# Patient Record
Sex: Female | Born: 1950 | Race: Black or African American | Hispanic: No | Smoking: Current some day smoker
Health system: Southern US, Community
[De-identification: ages and names within clinical notes are randomized; demographics above are authoritative.]

## PROBLEM LIST (undated history)

## (undated) DIAGNOSIS — K219 Gastro-esophageal reflux disease without esophagitis: Secondary | ICD-10-CM

## (undated) DIAGNOSIS — I10 Essential (primary) hypertension: Secondary | ICD-10-CM

## (undated) DIAGNOSIS — Z9289 Personal history of other medical treatment: Secondary | ICD-10-CM

## (undated) DIAGNOSIS — C569 Malignant neoplasm of unspecified ovary: Secondary | ICD-10-CM

## (undated) DIAGNOSIS — E785 Hyperlipidemia, unspecified: Secondary | ICD-10-CM

## (undated) DIAGNOSIS — M1990 Unspecified osteoarthritis, unspecified site: Secondary | ICD-10-CM

## (undated) HISTORY — DX: Malignant neoplasm of unspecified ovary: C56.9

## (undated) HISTORY — PX: PORTACATH PLACEMENT: SHX2246

## (undated) HISTORY — DX: Hyperlipidemia, unspecified: E78.5

---

## 2000-05-25 ENCOUNTER — Other Ambulatory Visit: Admission: RE | Admit: 2000-05-25 | Discharge: 2000-05-25 | Payer: Self-pay | Admitting: Internal Medicine

## 2000-06-19 ENCOUNTER — Ambulatory Visit (HOSPITAL_COMMUNITY): Admission: RE | Admit: 2000-06-19 | Discharge: 2000-06-19 | Payer: Self-pay | Admitting: Internal Medicine

## 2000-06-19 ENCOUNTER — Encounter: Payer: Self-pay | Admitting: Internal Medicine

## 2003-10-07 ENCOUNTER — Other Ambulatory Visit: Admission: RE | Admit: 2003-10-07 | Discharge: 2003-10-07 | Payer: Self-pay | Admitting: Internal Medicine

## 2003-10-20 ENCOUNTER — Ambulatory Visit (HOSPITAL_COMMUNITY): Admission: RE | Admit: 2003-10-20 | Discharge: 2003-10-20 | Payer: Self-pay | Admitting: Internal Medicine

## 2005-06-21 ENCOUNTER — Ambulatory Visit: Payer: Self-pay | Admitting: Gastroenterology

## 2005-07-05 ENCOUNTER — Encounter (INDEPENDENT_AMBULATORY_CARE_PROVIDER_SITE_OTHER): Payer: Self-pay | Admitting: Specialist

## 2005-07-05 ENCOUNTER — Ambulatory Visit: Payer: Self-pay | Admitting: Gastroenterology

## 2005-07-06 ENCOUNTER — Emergency Department (HOSPITAL_COMMUNITY): Admission: EM | Admit: 2005-07-06 | Discharge: 2005-07-07 | Payer: Self-pay | Admitting: Emergency Medicine

## 2006-08-07 ENCOUNTER — Ambulatory Visit (HOSPITAL_COMMUNITY): Admission: RE | Admit: 2006-08-07 | Discharge: 2006-08-07 | Payer: Self-pay | Admitting: Internal Medicine

## 2006-09-21 ENCOUNTER — Other Ambulatory Visit: Admission: RE | Admit: 2006-09-21 | Discharge: 2006-09-21 | Payer: Self-pay | Admitting: Internal Medicine

## 2008-12-11 ENCOUNTER — Ambulatory Visit (HOSPITAL_COMMUNITY): Admission: RE | Admit: 2008-12-11 | Discharge: 2008-12-11 | Payer: Self-pay | Admitting: Internal Medicine

## 2010-04-04 HISTORY — PX: JOINT REPLACEMENT: SHX530

## 2010-05-05 ENCOUNTER — Other Ambulatory Visit (HOSPITAL_COMMUNITY): Payer: Self-pay

## 2010-05-06 ENCOUNTER — Ambulatory Visit (HOSPITAL_COMMUNITY)
Admission: RE | Admit: 2010-05-06 | Discharge: 2010-05-06 | Disposition: A | Discharge status: Home / Self Care | Payer: BC Managed Care – PPO | Source: Ambulatory Visit | Attending: Orthopedic Surgery | Admitting: Orthopedic Surgery

## 2010-05-06 ENCOUNTER — Other Ambulatory Visit (HOSPITAL_COMMUNITY): Payer: Self-pay | Admitting: Orthopedic Surgery

## 2010-05-06 ENCOUNTER — Encounter (HOSPITAL_COMMUNITY): Payer: BC Managed Care – PPO

## 2010-05-06 DIAGNOSIS — M1712 Unilateral primary osteoarthritis, left knee: Secondary | ICD-10-CM

## 2010-05-06 DIAGNOSIS — Z01812 Encounter for preprocedural laboratory examination: Secondary | ICD-10-CM | POA: Insufficient documentation

## 2010-05-06 DIAGNOSIS — Z01818 Encounter for other preprocedural examination: Secondary | ICD-10-CM | POA: Insufficient documentation

## 2010-05-06 DIAGNOSIS — M1710 Unilateral primary osteoarthritis, unspecified knee: Secondary | ICD-10-CM | POA: Insufficient documentation

## 2010-05-06 DIAGNOSIS — Z01810 Encounter for preprocedural cardiovascular examination: Secondary | ICD-10-CM | POA: Insufficient documentation

## 2010-05-06 LAB — URINALYSIS, ROUTINE W REFLEX MICROSCOPIC
Bilirubin Urine: NEGATIVE
Ketones, ur: NEGATIVE mg/dL
Nitrite: POSITIVE — AB
Protein, ur: NEGATIVE mg/dL
Urine Glucose, Fasting: NEGATIVE mg/dL

## 2010-05-06 LAB — SURGICAL PCR SCREEN: MRSA, PCR: NEGATIVE

## 2010-05-06 LAB — DIFFERENTIAL
Basophils Absolute: 0 10*3/uL (ref 0.0–0.1)
Basophils Relative: 0 % (ref 0–1)
Eosinophils Absolute: 0.1 10*3/uL (ref 0.0–0.7)
Eosinophils Relative: 1 % (ref 0–5)
Lymphocytes Relative: 38 % (ref 12–46)

## 2010-05-06 LAB — CBC
Platelets: 247 10*3/uL (ref 150–400)
RDW: 14.7 % (ref 11.5–15.5)
WBC: 10.1 10*3/uL (ref 4.0–10.5)

## 2010-05-06 LAB — TYPE AND SCREEN
ABO/RH(D): O POS
Antibody Screen: NEGATIVE

## 2010-05-06 LAB — APTT: aPTT: 33 seconds (ref 24–37)

## 2010-05-06 LAB — BASIC METABOLIC PANEL
BUN: 18 mg/dL (ref 6–23)
Creatinine, Ser: 0.86 mg/dL (ref 0.4–1.2)
GFR calc non Af Amer: >60 mL/min (ref >60)

## 2010-05-06 LAB — URINE MICROSCOPIC-ADD ON

## 2010-05-06 LAB — PROTIME-INR: INR: 0.98 (ref 0.00–1.49)

## 2010-05-10 ENCOUNTER — Inpatient Hospital Stay (HOSPITAL_COMMUNITY)
Admission: RE | Admit: 2010-05-10 | Discharge: 2010-05-12 | DRG: 209 | Disposition: A | Discharge status: Home Health | Payer: BC Managed Care – PPO | Source: Ambulatory Visit | Attending: Orthopedic Surgery | Admitting: Orthopedic Surgery

## 2010-05-10 DIAGNOSIS — Z01812 Encounter for preprocedural laboratory examination: Secondary | ICD-10-CM

## 2010-05-10 DIAGNOSIS — F17200 Nicotine dependence, unspecified, uncomplicated: Secondary | ICD-10-CM | POA: Diagnosis present

## 2010-05-10 DIAGNOSIS — M1710 Unilateral primary osteoarthritis, unspecified knee: Principal | ICD-10-CM | POA: Diagnosis present

## 2010-05-10 DIAGNOSIS — Z01810 Encounter for preprocedural cardiovascular examination: Secondary | ICD-10-CM

## 2010-05-11 LAB — PROTIME-INR: Prothrombin Time: 14 seconds (ref 11.6–15.2)

## 2010-05-11 LAB — CBC
Hemoglobin: 11.1 g/dL — ABNORMAL LOW (ref 12.0–15.0)
MCH: 26.8 pg (ref 26.0–34.0)
MCHC: 32.7 g/dL (ref 30.0–36.0)
MCV: 81.9 fL (ref 78.0–100.0)

## 2010-05-11 LAB — BASIC METABOLIC PANEL
BUN: 8 mg/dL (ref 6–23)
CO2: 27 mEq/L (ref 19–32)
Calcium: 8.9 mg/dL (ref 8.4–10.5)
Chloride: 102 mEq/L (ref 96–112)
Creatinine, Ser: 0.89 mg/dL (ref 0.4–1.2)
GFR calc Af Amer: >60 mL/min (ref >60)
Glucose, Bld: 136 mg/dL — ABNORMAL HIGH (ref 70–99)

## 2010-05-11 NOTE — Op Note (Addendum)
Melissa Ball, Melissa Ball              ACCOUNT NO.:  [id number]  MEDICAL RECORD NO.:  [id number]           PATIENT TYPE:  I  LOCATION:  5002                         FACILITY:  [facility]  PHYSICIAN:  Feliberto Gottron. Turner Daniels, M.D.   DATE OF BIRTH:  05/03/1950  DATE OF PROCEDURE:  05/10/2010 DATE OF DISCHARGE:                              OPERATIVE REPORT   PREOPERATIVE DIAGNOSIS:  End-stage arthritis of the left knee.  POSTOPERATIVE DIAGNOSIS:  End-stage arthritis of the left knee.  PROCEDURE:  Left total knee arthroplasty using DePuy Sigma RP components, 3 femur, 4 tibia, 10-mm Sigma RP bearing, 38-mm patellar button.  All components cemented with a double batch of DePuy HV cement with 1500 mg of Zinacef.  SURGEON:  Feliberto Gottron.  Turner Daniels, MD  FIRST ASSISTANT:  Shirl Harris, PA-C.  ANESTHESIA:  General endotracheal.  ESTIMATED BLOOD LOSS:  Minimal.  FLUID REPLACEMENT:  1500 mL of crystalloid.  DRAINS PLACED:  Foley catheter and two medium hemostats.  URINE OUTPUT:  400 mL.  TOURNIQUET TIME:  1 hour and 35 minutes.  INDICATIONS FOR PROCEDURE:  A 60 year old woman with end-stage arthritis of left greater than right knee varus deformity and 10 degrees flexion contracture.  She desires elective left total knee arthroplasty having failed conservative measures with physical therapy, anti-inflammatory medicine, cortisone injections, and Viscoat supplementation.  Her pain now prevents ambulation more than a couple of blocks, wakes her up at night, and interferes with activities of daily living.  She desires elective left total knee arthroplasty.  Risks and benefits of surgery have been discussed, questions answered.  DESCRIPTION OF PROCEDURE:  The patient identified by armband, received preoperative IV antibiotics in the holding area at [facility]. She also had a left femoral nerve block placed by Dr. Ivin Booty, taken to operating room 5, appropriate anesthetic monitors were attached,  general endotracheal anesthesia was induced with the patient in supine position. Foley catheter inserted.  Foot positioner and lateral post applied to the table.  Zimmer tourniquet applied high to the left thigh and left lower extremity prepped and draped in sterile fashion from the ankle to the midthigh.  Time-out procedure performed.  The limb was wrapped with an Esmarch bandage, knee bent, tourniquet inflated to 350 mmHg and began the operation by making an anterior midline incision starting one handbreadth above the patella going over the patella 1 cm medial to and 4 cm distal to the tibial tubercles.  Small bleeders in the skin and subcutaneous tissue identified and cauterized.  Transverse retinaculum incised, reflected medially allowing a medial parapatellar arthrotomy, patella everted, prepatellar fat pad resected.  Superficial medial collateral ligament elevated from anterior to posterior off the medial flare of the tibia leaving intact distally.  This loosened up the MCL to help correct the varus deformity.  Once this had been accomplished, the patella was everted, the knee was hyperflexed exposing the arthritic joint, which has large medial peripheral osteophytes and with bone-on- bone to the medial compartment less so the patellofemoral compartment. Anterior one half of the menisci resected.  Cruciate ligaments were resected.  Notch osteophytes removed with an osteotome as were  the tibial spines allowing placement of posteromedial Z-retractor, McCulloch retractor through the notch and lateral Hohmann retractor.  This enhanced our exposure to the proximal tibia, which was then entered with the step drill in line with the axis of the tibia.  Cutting guide was set at 2 degrees posterior slope and was pinned, so we removed 45 mm of bone medially, 8-9 mm bone laterally.  Satisfied with our tibial resection, we entered the distal femur 2 mm anterior to the PCL origin followed by the  intramedullary rod set for a 5 degrees pf left distal femoral cut at 11 mm.  This was pinned along the epicondylar axis and the distal femoral cut accomplished.  We then sized for a three femur, used the posterior referencing sizing guide, set the pins at 3 degrees of external rotation and screwed the chamfer cutting guide into place. The anterior and posterior chamfer cuts accomplished without difficulty followed by the Sigma RP box cutting guide and box cut.  We then brought the distal femur elevated anteriorly to remove posterior osteophytes from the femoral condyles, brought the knee to full extension to remove the posterior one-half of the menisci and checked our spacer with a 10- mm block and found it to be excellent.  The patella was measured at 26 mm cutting guide set at 15, and the posterior 10 mm of the patella resected followed by sizing for 38 button and drilling the patella. Once again, the knee was hyperflexed, tibia exposed.  We sized for a #4 tibial baseplate, pinned the trial into place followed by the smokestack in the conical reamer and the Delta fin keel.  We then hammered into place a three left femoral trial, drilled the lugs, inserted a 10-mm trial bearing and a 38 trial patellar button, brought the knee to full extension and then had it flexed to about 120 degrees limited by adipose tissue.  At this point, the trial components removed.  All bony surfaces water picked, clean and dried with suction sponges.  Double batch of DePuy HV cement was then mixed with 1500 mg of Zinacef in the monomer and applied to all bony metallic mating surfaces except for the posterior condyles of the femur itself.  In order, we hammered into a place a 4 tibial baseplate, 3 left femur, and the removed excess cement. We inserted a 10-mm Sigma RP bearing and brought the knee to full extension with compression and then clamped into place the 38-mm patellar button and again removed the excess  cement.  The wound was irrigated out with normal saline solution.  Medium Hemovac drains were inserted from the anterolateral approach, and the wound closed in layers with running #1 Vicryl suture and this tendon, 2-0 and 0 undyed Vicryl suture in the subcutaneous tissue, and staples on the skin.  A dressing of Xeroform, 4 x 4 dressings, sponges, Webril, and Ace wrap applied. The tourniquet was let down.  The patient awakened and extubated, taken to the recovery room without difficulty.     Feliberto Gottron. Turner Daniels, M.D.     Ovid Curd  D:  05/10/2010  T:  05/10/2010  Job:  [id number]  Electronically Signed by Gean Birchwood M.D. on 05/11/2010 08:47:08 AM

## 2010-05-12 LAB — CBC
HCT: 31.3 % — ABNORMAL LOW (ref 36.0–46.0)
MCH: 26.3 pg (ref 26.0–34.0)
MCHC: 32.3 g/dL (ref 30.0–36.0)
MCV: 81.5 fL (ref 78.0–100.0)
Platelets: 185 10*3/uL (ref 150–400)
RBC: 3.84 MIL/uL — ABNORMAL LOW (ref 3.87–5.11)
RDW: 14.6 % (ref 11.5–15.5)
WBC: 14.1 10*3/uL — ABNORMAL HIGH (ref 4.0–10.5)

## 2010-05-12 LAB — PROTIME-INR: Prothrombin Time: 16.4 seconds — ABNORMAL HIGH (ref 11.6–15.2)

## 2010-06-01 ENCOUNTER — Ambulatory Visit: Payer: BC Managed Care – PPO | Attending: Orthopedic Surgery

## 2010-06-04 ENCOUNTER — Ambulatory Visit: Payer: BC Managed Care – PPO

## 2010-06-16 ENCOUNTER — Ambulatory Visit: Payer: BC Managed Care – PPO | Attending: Orthopedic Surgery

## 2010-06-16 DIAGNOSIS — R262 Difficulty in walking, not elsewhere classified: Secondary | ICD-10-CM | POA: Insufficient documentation

## 2010-06-16 DIAGNOSIS — M25569 Pain in unspecified knee: Secondary | ICD-10-CM | POA: Insufficient documentation

## 2010-06-16 DIAGNOSIS — M25669 Stiffness of unspecified knee, not elsewhere classified: Secondary | ICD-10-CM | POA: Insufficient documentation

## 2010-06-16 DIAGNOSIS — IMO0001 Reserved for inherently not codable concepts without codable children: Secondary | ICD-10-CM | POA: Insufficient documentation

## 2010-08-24 ENCOUNTER — Other Ambulatory Visit (HOSPITAL_COMMUNITY)
Admission: RE | Admit: 2010-08-24 | Discharge: 2010-08-24 | Disposition: A | Discharge status: Home / Self Care | Payer: BC Managed Care – PPO | Source: Ambulatory Visit | Attending: Internal Medicine | Admitting: Internal Medicine

## 2010-08-24 ENCOUNTER — Other Ambulatory Visit (HOSPITAL_COMMUNITY): Payer: Self-pay | Admitting: Internal Medicine

## 2010-08-24 DIAGNOSIS — Z01419 Encounter for gynecological examination (general) (routine) without abnormal findings: Secondary | ICD-10-CM | POA: Insufficient documentation

## 2010-08-24 DIAGNOSIS — Z1231 Encounter for screening mammogram for malignant neoplasm of breast: Secondary | ICD-10-CM

## 2010-09-06 ENCOUNTER — Ambulatory Visit (HOSPITAL_COMMUNITY)
Admission: RE | Admit: 2010-09-06 | Discharge: 2010-09-06 | Disposition: A | Discharge status: Home / Self Care | Payer: BC Managed Care – PPO | Source: Ambulatory Visit | Attending: Internal Medicine | Admitting: Internal Medicine

## 2010-09-06 DIAGNOSIS — Z1231 Encounter for screening mammogram for malignant neoplasm of breast: Secondary | ICD-10-CM

## 2011-10-19 ENCOUNTER — Emergency Department (HOSPITAL_COMMUNITY)
Admission: EM | Admit: 2011-10-19 | Discharge: 2011-10-19 | Disposition: A | Discharge status: Home / Self Care | Payer: BC Managed Care – PPO | Attending: Emergency Medicine | Admitting: Emergency Medicine

## 2011-10-19 ENCOUNTER — Encounter (HOSPITAL_COMMUNITY): Payer: Self-pay | Admitting: *Deleted

## 2011-10-19 DIAGNOSIS — Z23 Encounter for immunization: Secondary | ICD-10-CM | POA: Insufficient documentation

## 2011-10-19 DIAGNOSIS — Z139 Encounter for screening, unspecified: Secondary | ICD-10-CM

## 2011-10-19 DIAGNOSIS — Z203 Contact with and (suspected) exposure to rabies: Secondary | ICD-10-CM | POA: Insufficient documentation

## 2011-10-19 HISTORY — DX: Essential (primary) hypertension: I10

## 2011-10-19 NOTE — ED Provider Notes (Signed)
History   This chart was scribed for Nelia Shi, MD by Sofie Rower. The patient was seen in room TR09C/TR09C and the patient's care was started at 3:22 PM     CSN: [id number]  Arrival date & time 10/19/11  1414   None     Chief Complaint  Patient presents with  . Rabies Injection    (Consider location/radiation/quality/duration/timing/severity/associated sxs/prior treatment) The history is provided by the patient. No language interpreter was used.    Melissa Ball is a 61 y.o. female who presents to the Emergency Department complaining of moderate, episodic possible rabies exposure onset yesterday. The pt informs the EDP that she was in her bedroom last night where she noticed a bat flying around her living room. Pt reports that she has seen baby bats since, especially at night. Pt has a hx of hypertension, total knee replacement.   Pt denies any physical contact with the bats, any bite from the bats.      Past Medical History  Diagnosis Date  . Hypertension     Past Surgical History  Procedure Date  . Replacement total knee     No family history on file.  History  Substance Use Topics  . Smoking status: Current Everyday Smoker  . Smokeless tobacco: Not on file  . Alcohol Use: Yes    OB History    Grav Para Term Preterm Abortions TAB SAB Ect Mult Living                  Review of Systems  All other systems reviewed and are negative.    10 Systems reviewed and all are negative for acute change except as noted in the HPI.    Allergies  Review of patient's allergies indicates no known allergies.  Home Medications   Current Outpatient Rx  Name Route Sig Dispense Refill  . LISINOPRIL-HYDROCHLOROTHIAZIDE 20-25 MG PO TABS Oral Take 1 tablet by mouth daily.    . MELOXICAM 15 MG PO TABS Oral Take 15 mg by mouth daily.    Marland Kitchen VITAMIN E 200 UNITS PO CAPS Oral Take 200 Units by mouth daily.      BP 96/66  Pulse 109  Temp 97.9 F (36.6 C) (Oral)   Resp 20  Ht 5\' 4"  (1.626 m)  Wt 256 lb (116.121 kg)  BMI 43.94 kg/m2  SpO2 94%  Physical Exam  Nursing note and vitals reviewed. Constitutional: She is oriented to person, place, and time. She appears well-developed and well-nourished. No distress.  HENT:  Head: Normocephalic and atraumatic.  Right Ear: External ear normal.  Left Ear: External ear normal.  Nose: Nose normal.  Eyes: Pupils are equal, round, and reactive to light.  Neck: Normal range of motion.  Cardiovascular: Normal rate and intact distal pulses.   Pulmonary/Chest: No respiratory distress.  Abdominal: Normal appearance. She exhibits no distension.  Musculoskeletal: Normal range of motion.  Neurological: She is alert and oriented to person, place, and time. No cranial nerve deficit.  Skin: Skin is warm and dry. No rash noted.  Psychiatric: She has a normal mood and affect. Her behavior is normal.    ED Course  Procedures (including critical care time)  DIAGNOSTIC STUDIES: Oxygen Saturation is 94% on room air, adequate by my interpretation.    COORDINATION OF CARE:    3:28PM- EDP at bedside discusses treatment plan concerning elimination of the need for a possible rabies injection.   Labs Reviewed - No data to display No  results found.   1. Screening       MDM  Patient was adamant that she was not exposed to the back while she was sleeping.  The time she saw the bats in her house she was awake and had no breath contact with the bats.  I explained to her that she had a very low probability of being exposed to rabies she elected to not take the injections.      I personally performed the services described in this documentation, which was scribed in my presence. The recorded information has been reviewed and considered.    Nelia Shi, MD 10/19/11 1535

## 2011-10-19 NOTE — ED Notes (Signed)
MD at bedside. 

## 2011-10-19 NOTE — ED Notes (Signed)
Patient reports she has bats in her attick.  She is here for possible exposure and rabies vaccination.

## 2012-02-21 ENCOUNTER — Ambulatory Visit (INDEPENDENT_AMBULATORY_CARE_PROVIDER_SITE_OTHER): Payer: BC Managed Care – PPO | Admitting: Internal Medicine

## 2012-02-21 ENCOUNTER — Other Ambulatory Visit (INDEPENDENT_AMBULATORY_CARE_PROVIDER_SITE_OTHER): Payer: BC Managed Care – PPO

## 2012-02-21 ENCOUNTER — Encounter: Payer: Self-pay | Admitting: Internal Medicine

## 2012-02-21 VITALS — BP 134/88 | HR 77 | Temp 96.8°F | Ht 64.0 in | Wt 251.0 lb

## 2012-02-21 DIAGNOSIS — Z1321 Encounter for screening for nutritional disorder: Secondary | ICD-10-CM

## 2012-02-21 DIAGNOSIS — Z1329 Encounter for screening for other suspected endocrine disorder: Secondary | ICD-10-CM

## 2012-02-21 DIAGNOSIS — Z0000 Encounter for general adult medical examination without abnormal findings: Secondary | ICD-10-CM

## 2012-02-21 DIAGNOSIS — R059 Cough, unspecified: Secondary | ICD-10-CM

## 2012-02-21 DIAGNOSIS — I10 Essential (primary) hypertension: Secondary | ICD-10-CM | POA: Insufficient documentation

## 2012-02-21 DIAGNOSIS — Z131 Encounter for screening for diabetes mellitus: Secondary | ICD-10-CM

## 2012-02-21 DIAGNOSIS — R05 Cough: Secondary | ICD-10-CM

## 2012-02-21 DIAGNOSIS — Z130 Encounter for screening for diseases of the blood and blood-forming organs and certain disorders involving the immune mechanism: Secondary | ICD-10-CM

## 2012-02-21 DIAGNOSIS — Z13220 Encounter for screening for lipoid disorders: Secondary | ICD-10-CM

## 2012-02-21 DIAGNOSIS — Z1231 Encounter for screening mammogram for malignant neoplasm of breast: Secondary | ICD-10-CM

## 2012-02-21 DIAGNOSIS — Z1239 Encounter for other screening for malignant neoplasm of breast: Secondary | ICD-10-CM

## 2012-02-21 LAB — BASIC METABOLIC PANEL
BUN: 17 mg/dL (ref 6–23)
Calcium: 9.4 mg/dL (ref 8.4–10.5)
Chloride: 105 mEq/L (ref 96–112)
Creatinine, Ser: 0.8 mg/dL (ref 0.4–1.2)

## 2012-02-21 LAB — CBC
HCT: 39.9 % (ref 36.0–46.0)
Hemoglobin: 12.9 g/dL (ref 12.0–15.0)
MCHC: 32.2 g/dL (ref 30.0–36.0)
MCV: 82.8 fl (ref 78.0–100.0)
Platelets: 267 10*3/uL (ref 150.0–400.0)
RDW: 15.9 % — ABNORMAL HIGH (ref 11.5–14.6)

## 2012-02-21 LAB — LIPID PANEL
Cholesterol: 208 mg/dL — ABNORMAL HIGH (ref 0–200)
Total CHOL/HDL Ratio: 6
Triglycerides: 197 mg/dL — ABNORMAL HIGH (ref 0.0–149.0)

## 2012-02-21 MED ORDER — BENZONATATE 200 MG PO CAPS: 200.0000 mg | ORAL_CAPSULE | Freq: Two times a day (BID) | ORAL | Status: DC | PRN

## 2012-02-21 MED ORDER — LISINOPRIL-HYDROCHLOROTHIAZIDE 20-25 MG PO TABS: 1.0000 | ORAL_TABLET | Freq: Every day | ORAL | Status: DC

## 2012-02-21 NOTE — Patient Instructions (Addendum)
Health Maintenance, Females A healthy lifestyle and preventative care can promote health and wellness.  Maintain regular health, dental, and eye exams.   Eat a healthy diet. Foods like vegetables, fruits, whole grains, low-fat dairy products, and lean protein foods contain the nutrients you need without too many calories. Decrease your intake of foods high in solid fats, added sugars, and salt. Get information about a proper diet from your caregiver, if necessary.   Regular physical exercise is one of the most important things you can do for your health. Most adults should get at least 150 minutes of moderate-intensity exercise (any activity that increases your heart rate and causes you to sweat) each week. In addition, most adults need muscle-strengthening exercises on 2 or more days a week.     Maintain a healthy weight. The body mass index (BMI) is a screening tool to identify possible weight problems. It provides an estimate of body fat based on height and weight. Your caregiver can help determine your BMI, and can help you achieve or maintain a healthy weight. For adults 20 years and older:   A BMI below 18.5 is considered underweight.   A BMI of 18.5 to 24.9 is normal.   A BMI of 25 to 29.9 is considered overweight.   A BMI of 30 and above is considered obese.   Maintain normal blood lipids and cholesterol by exercising and minimizing your intake of saturated fat. Eat a balanced diet with plenty of fruits and vegetables. Blood tests for lipids and cholesterol should begin at age 74 and be repeated every 5 years. If your lipid or cholesterol levels are high, you are over 50, or you are a high risk for heart disease, you may need your cholesterol levels checked more frequently. Ongoing high lipid and cholesterol levels should be treated with medicines if diet and exercise are not effective.   If you smoke, find out from your caregiver how to quit. If you do not use tobacco, do not start.    If you are pregnant, do not drink alcohol. If you are breastfeeding, be very cautious about drinking alcohol. If you are not pregnant and choose to drink alcohol, do not exceed 1 drink per day. One drink is considered to be 12 ounces (355 mL) of beer, 5 ounces (148 mL) of wine, or 1.5 ounces (44 mL) of liquor.   Avoid use of street drugs. Do not share needles with anyone. Ask for help if you need support or instructions about stopping the use of drugs.   High blood pressure causes heart disease and increases the risk of stroke. Blood pressure should be checked at least every 1 to 2 years. Ongoing high blood pressure should be treated with medicines, if weight loss and exercise are not effective.   If you are 54 to 63 years old, ask your caregiver if you should take aspirin to prevent strokes.   Diabetes screening involves taking a blood sample to check your fasting blood sugar level. This should be done once every 3 years, after age 17, if you are within normal weight and without risk factors for diabetes. Testing should be considered at a younger age or be carried out more frequently if you are overweight and have at least 1 risk factor for diabetes.   Breast cancer screening is essential preventative care for women. You should practice "breast self-awareness." This means understanding the normal appearance and feel of your breasts and may include breast self-examination. Any changes detected, no  matter how small, should be reported to a caregiver. Women in their 82s and 30s should have a clinical breast exam (CBE) by a caregiver as part of a regular health exam every 1 to 3 years. After age 60, women should have a CBE every year. Starting at age 45, women should consider having a mammogram (breast X-ray) every year. Women who have a family history of breast cancer should talk to their caregiver about genetic screening. Women at a high risk of breast cancer should talk to their caregiver about having  an MRI and a mammogram every year.   The Pap test is a screening test for cervical cancer. Women should have a Pap test starting at age 71. Between ages 52 and 13, Pap tests should be repeated every 2 years. Beginning at age 70, you should have a Pap test every 3 years as long as the past 3 Pap tests have been normal. If you had a hysterectomy for a problem that was not cancer or a condition that could lead to cancer, then you no longer need Pap tests. If you are between ages 45 and 66, and you have had normal Pap tests going back 10 years, you no longer need Pap tests. If you have had past treatment for cervical cancer or a condition that could lead to cancer, you need Pap tests and screening for cancer for at least 20 years after your treatment. If Pap tests have been discontinued, risk factors (such as a new sexual partner) need to be reassessed to determine if screening should be resumed. Some women have medical problems that increase the chance of getting cervical cancer. In these cases, your caregiver may recommend more frequent screening and Pap tests.   The human papillomavirus (HPV) test is an additional test that may be used for cervical cancer screening. The HPV test looks for the virus that can cause the cell changes on the cervix. The cells collected during the Pap test can be tested for HPV. The HPV test could be used to screen women aged 58 years and older, and should be used in women of any age who have unclear Pap test results. After the age of 51, women should have HPV testing at the same frequency as a Pap test.   Colorectal cancer can be detected and often prevented. Most routine colorectal cancer screening begins at the age of 75 and continues through age 69. However, your caregiver may recommend screening at an earlier age if you have risk factors for colon cancer. On a yearly basis, your caregiver may provide home test kits to check for hidden blood in the stool. Use of a small camera at  the end of a tube, to directly examine the colon (sigmoidoscopy or colonoscopy), can detect the earliest forms of colorectal cancer. Talk to your caregiver about this at age 12, when routine screening begins. Direct examination of the colon should be repeated every 5 to 10 years through age 22, unless early forms of pre-cancerous polyps or small growths are found.   Hepatitis C blood testing is recommended for all people born from [birth year] through 1965 and any individual with known risks for hepatitis C.   Practice safe sex. Use condoms and avoid high-risk sexual practices to reduce the spread of sexually transmitted infections (STIs). Sexually active women aged 30 and younger should be checked for Chlamydia, which is a common sexually transmitted infection. Older women with new or multiple partners should also be tested for Chlamydia. Testing  for other STIs is recommended if you are sexually active and at increased risk.   Osteoporosis is a disease in which the bones lose minerals and strength with aging. This can result in serious bone fractures. The risk of osteoporosis can be identified using a bone density scan. Women ages 27 and over and women at risk for fractures or osteoporosis should discuss screening with their caregivers. Ask your caregiver whether you should be taking a calcium supplement or vitamin D to reduce the rate of osteoporosis.   Menopause can be associated with physical symptoms and risks. Hormone replacement therapy is available to decrease symptoms and risks. You should talk to your caregiver about whether hormone replacement therapy is right for you.   Use sunscreen with a sun protection factor (SPF) of 30 or greater. Apply sunscreen liberally and repeatedly throughout the day. You should seek shade when your shadow is shorter than you. Protect yourself by wearing long sleeves, pants, a wide-brimmed hat, and sunglasses year round, whenever you are outdoors.   Notify your caregiver  of new moles or changes in moles, especially if there is a change in shape or color. Also notify your caregiver if a mole is larger than the size of a pencil eraser.   Stay current with your immunizations.  Document Released: 10/04/2010 Document Revised: 06/13/2011 Document Reviewed: 10/04/2010 [facility] Patient Information 2013 [place], [place].   Preventive Care for Adults, Female A healthy lifestyle and preventive care can promote health and wellness. Preventive health guidelines for women include the following key practices.  A routine yearly physical is a good way to check with your caregiver about your health and preventive screening. It is a chance to share any concerns and updates on your health, and to receive a thorough exam.   Visit your dentist for a routine exam and preventive care every 6 months. Brush your teeth twice a day and floss once a day. Good oral hygiene prevents tooth decay and gum disease.   The frequency of eye exams is based on your age, health, family medical history, use of contact lenses, and other factors. Follow your caregiver's recommendations for frequency of eye exams.   Eat a healthy diet. Foods like vegetables, fruits, whole grains, low-fat dairy products, and lean protein foods contain the nutrients you need without too many calories. Decrease your intake of foods high in solid fats, added sugars, and salt. Eat the right amount of calories for you. Get information about a proper diet from your caregiver, if necessary.   Regular physical exercise is one of the most important things you can do for your health. Most adults should get at least 150 minutes of moderate-intensity exercise (any activity that increases your heart rate and causes you to sweat) each week. In addition, most adults need muscle-strengthening exercises on 2 or more days a week.   Maintain a healthy weight. The body mass index (BMI) is a screening tool to identify possible weight problems. It  provides an estimate of body fat based on height and weight. Your caregiver can help determine your BMI, and can help you achieve or maintain a healthy weight. For adults 20 years and older:   A BMI below 18.5 is considered underweight.   A BMI of 18.5 to 24.9 is normal.   A BMI of 25 to 29.9 is considered overweight.   A BMI of 30 and above is considered obese.   Maintain normal blood lipids and cholesterol levels by exercising and minimizing your intake  of saturated fat. Eat a balanced diet with plenty of fruit and vegetables. Blood tests for lipids and cholesterol should begin at age 17 and be repeated every 5 years. If your lipid or cholesterol levels are high, you are over 50, or you are at high risk for heart disease, you may need your cholesterol levels checked more frequently. Ongoing high lipid and cholesterol levels should be treated with medicines if diet and exercise are not effective.   If you smoke, find out from your caregiver how to quit. If you do not use tobacco, do not start.   If you are pregnant, do not drink alcohol. If you are breastfeeding, be very cautious about drinking alcohol. If you are not pregnant and choose to drink alcohol, do not exceed 1 drink per day. One drink is considered to be 12 ounces (355 mL) of beer, 5 ounces (148 mL) of wine, or 1.5 ounces (44 mL) of liquor.   Avoid use of street drugs. Do not share needles with anyone. Ask for help if you need support or instructions about stopping the use of drugs.   High blood pressure causes heart disease and increases the risk of stroke. Your blood pressure should be checked at least every 1 to 2 years. Ongoing high blood pressure should be treated with medicines if weight loss and exercise are not effective.   If you are 27 to 63 years old, ask your caregiver if you should take aspirin to prevent strokes.   Diabetes screening involves taking a blood sample to check your fasting blood sugar level. This should be  done once every 3 years, after age 2, if you are within normal weight and without risk factors for diabetes. Testing should be considered at a younger age or be carried out more frequently if you are overweight and have at least 1 risk factor for diabetes.   Breast cancer screening is essential preventive care for women. You should practice "breast self-awareness." This means understanding the normal appearance and feel of your breasts and may include breast self-examination. Any changes detected, no matter how small, should be reported to a caregiver. Women in their 2s and 30s should have a clinical breast exam (CBE) by a caregiver as part of a regular health exam every 1 to 3 years. After age 49, women should have a CBE every year. Starting at age 57, women should consider having a mammography (breast X-ray test) every year. Women who have a family history of breast cancer should talk to their caregiver about genetic screening. Women at a high risk of breast cancer should talk to their caregivers about having magnetic resonance imaging (MRI) and a mammography every year.   The Pap test is a screening test for cervical cancer. A Pap test can show cell changes on the cervix that might become cervical cancer if left untreated. A Pap test is a procedure in which cells are obtained and examined from the lower end of the uterus (cervix).   Women should have a Pap test starting at age 68.   Between ages 24 and 71, Pap tests should be repeated every 2 years.   Beginning at age 70, you should have a Pap test every 3 years as long as the past 3 Pap tests have been normal.   Some women have medical problems that increase the chance of getting cervical cancer. Talk to your caregiver about these problems. It is especially important to talk to your caregiver if a new problem  develops soon after your last Pap test. In these cases, your caregiver may recommend more frequent screening and Pap tests.   The above  recommendations are the same for women who have or have not gotten the vaccine for human papillomavirus (HPV).   If you had a hysterectomy for a problem that was not cancer or a condition that could lead to cancer, then you no longer need Pap tests. Even if you no longer need a Pap test, a regular exam is a good idea to make sure no other problems are starting.   If you are between ages 51 and 92, and you have had normal Pap tests going back 10 years, you no longer need Pap tests. Even if you no longer need a Pap test, a regular exam is a good idea to make sure no other problems are starting.   If you have had past treatment for cervical cancer or a condition that could lead to cancer, you need Pap tests and screening for cancer for at least 20 years after your treatment.   If Pap tests have been discontinued, risk factors (such as a new sexual partner) need to be reassessed to determine if screening should be resumed.   The HPV test is an additional test that may be used for cervical cancer screening. The HPV test looks for the virus that can cause the cell changes on the cervix. The cells collected during the Pap test can be tested for HPV. The HPV test could be used to screen women aged 73 years and older, and should be used in women of any age who have unclear Pap test results. After the age of 62, women should have HPV testing at the same frequency as a Pap test.   Colorectal cancer can be detected and often prevented. Most routine colorectal cancer screening begins at the age of 27 and continues through age 62. However, your caregiver may recommend screening at an earlier age if you have risk factors for colon cancer. On a yearly basis, your caregiver may provide home test kits to check for hidden blood in the stool. Use of a small camera at the end of a tube, to directly examine the colon (sigmoidoscopy or colonoscopy), can detect the earliest forms of colorectal cancer. Talk to your caregiver  about this at age 3, when routine screening begins.  Direct examination of the colon should be repeated every 5 to 10 years through age 33, unless early forms of pre-cancerous polyps or small growths are found.   Hepatitis C blood testing is recommended for all people born from [birth year] through 1965 and any individual with known risks for hepatitis C.   Practice safe sex. Use condoms and avoid high-risk sexual practices to reduce the spread of sexually transmitted infections (STIs). STIs include gonorrhea, chlamydia, syphilis, trichomonas, herpes, HPV, and human immunodeficiency virus (HIV). Herpes, HIV, and HPV are viral illnesses that have no cure. They can result in disability, cancer, and death. Sexually active women aged 63 and younger should be checked for chlamydia. Older women with new or multiple partners should also be tested for chlamydia. Testing for other STIs is recommended if you are sexually active and at increased risk.   Osteoporosis is a disease in which the bones lose minerals and strength with aging. This can result in serious bone fractures. The risk of osteoporosis can be identified using a bone density scan. Women ages 36 and over and women at risk for fractures or osteoporosis should  discuss screening with their caregivers. Ask your caregiver whether you should take a calcium supplement or vitamin D to reduce the rate of osteoporosis.   Menopause can be associated with physical symptoms and risks. Hormone replacement therapy is available to decrease symptoms and risks. You should talk to your caregiver about whether hormone replacement therapy is right for you.   Use sunscreen with sun protection factor (SPF) of 30 or more. Apply sunscreen liberally and repeatedly throughout the day. You should seek shade when your shadow is shorter than you. Protect yourself by wearing long sleeves, pants, a wide-brimmed hat, and sunglasses year round, whenever you are outdoors.   Once a month, do  a whole body skin exam, using a mirror to look at the skin on your back. Notify your caregiver of new moles, moles that have irregular borders, moles that are larger than a pencil eraser, or moles that have changed in shape or color.   Stay current with required immunizations.   Influenza. You need a dose every fall (or winter). The composition of the flu vaccine changes each year, so being vaccinated once is not enough.   Pneumococcal polysaccharide. You need 1 to 2 doses if you smoke cigarettes or if you have certain chronic medical conditions. You need 1 dose at age 40 (or older) if you have never been vaccinated.   Tetanus, diphtheria, pertussis (Tdap, Td). Get 1 dose of Tdap vaccine if you are younger than age 50, are over 75 and have contact with an infant, are a Research scientist (physical sciences), are pregnant, or simply want to be protected from whooping cough. After that, you need a Td booster dose every 10 years. Consult your caregiver if you have not had at least 3 tetanus and diphtheria-containing shots sometime in your life or have a deep or dirty wound.   HPV. You need this vaccine if you are a woman age 10 or younger. The vaccine is given in 3 doses over 6 months.   Measles, mumps, rubella (MMR). You need at least 1 dose of MMR if you were born in 1957 or later. You may also need a second dose.   Meningococcal. If you are age 55 to 52 and a first-year college student living in a residence hall, or have one of several medical conditions, you need to get vaccinated against meningococcal disease. You may also need additional booster doses.   Zoster (shingles). If you are age 63 or older, you should get this vaccine.   Varicella (chickenpox). If you have never had chickenpox or you were vaccinated but received only 1 dose, talk to your caregiver to find out if you need this vaccine.   Hepatitis A. You need this vaccine if you have a specific risk factor for hepatitis A virus infection or you simply  wish to be protected from this disease. The vaccine is usually given as 2 doses, 6 to 18 months apart.   Hepatitis B. You need this vaccine if you have a specific risk factor for hepatitis B virus infection or you simply wish to be protected from this disease. The vaccine is given in 3 doses, usually over 6 months.  Preventive Services / Frequency Ages 43 to 22  Blood pressure check.** / Every 1 to 2 years.   Lipid and cholesterol check.** / Every 5 years beginning at age 10.   Clinical breast exam.** / Every 3 years for women in their 88s and 30s.   Pap test.** / Every 2 years from  ages 24 through 91. Every 3 years starting at age 68 through age 8 or 59 with a history of 3 consecutive normal Pap tests.   HPV screening.** / Every 3 years from ages 21 through ages 3 to 50 with a history of 3 consecutive normal Pap tests.   Hepatitis C blood test.** / For any individual with known risks for hepatitis C.   Skin self-exam. / Monthly.   Influenza immunization.** / Every year.   Pneumococcal polysaccharide immunization.** / 1 to 2 doses if you smoke cigarettes or if you have certain chronic medical conditions.   Tetanus, diphtheria, pertussis (Tdap, Td) immunization. / A one-time dose of Tdap vaccine. After that, you need a Td booster dose every 10 years.   HPV immunization. / 3 doses over 6 months, if you are 54 and younger.   Measles, mumps, rubella (MMR) immunization. / You need at least 1 dose of MMR if you were born in 1957 or later. You may also need a second dose.   Meningococcal immunization. / 1 dose if you are age 66 to 50 and a first-year college student living in a residence hall, or have one of several medical conditions, you need to get vaccinated against meningococcal disease. You may also need additional booster doses.   Varicella immunization.** / Consult your caregiver.   Hepatitis A immunization.** / Consult your caregiver. 2 doses, 6 to 18 months apart.   Hepatitis  B immunization.** / Consult your caregiver. 3 doses usually over 6 months.  Ages 77 to 14  Blood pressure check.** / Every 1 to 2 years.   Lipid and cholesterol check.** / Every 5 years beginning at age 68.   Clinical breast exam.** / Every year after age 55.   Mammogram.** / Every year beginning at age 53 and continuing for as long as you are in good health. Consult with your caregiver.   Pap test.** / Every 3 years starting at age 83 through age 61 or 77 with a history of 3 consecutive normal Pap tests.   HPV screening.** / Every 3 years from ages 51 through ages 87 to 31 with a history of 3 consecutive normal Pap tests.   Fecal occult blood test (FOBT) of stool. / Every year beginning at age 57 and continuing until age 69. You may not need to do this test if you get a colonoscopy every 10 years.   Flexible sigmoidoscopy or colonoscopy.** / Every 5 years for a flexible sigmoidoscopy or every 10 years for a colonoscopy beginning at age 33 and continuing until age 10.   Hepatitis C blood test.** / For all people born from [birth year] through 1967 and any individual with known risks for hepatitis C.   Skin self-exam. / Monthly.   Influenza immunization.** / Every year.   Pneumococcal polysaccharide immunization.** / 1 to 2 doses if you smoke cigarettes or if you have certain chronic medical conditions.   Tetanus, diphtheria, pertussis (Tdap, Td) immunization.** / A one-time dose of Tdap vaccine. After that, you need a Td booster dose every 10 years.   Measles, mumps, rubella (MMR) immunization. / You need at least 1 dose of MMR if you were born in 1957 or later. You may also need a second dose.   Varicella immunization.** / Consult your caregiver.   Meningococcal immunization.** / Consult your caregiver.   Hepatitis A immunization.** / Consult your caregiver. 2 doses, 6 to 18 months apart.   Hepatitis B immunization.** / Consult your caregiver.  3 doses, usually over 6 months.  Ages 66  and over  Blood pressure check.** / Every 1 to 2 years.   Lipid and cholesterol check.** / Every 5 years beginning at age 59.   Clinical breast exam.** / Every year after age 30.   Mammogram.** / Every year beginning at age 1 and continuing for as long as you are in good health. Consult with your caregiver.   Pap test.** / Every 3 years starting at age 42 through age 3 or 64 with a 3 consecutive normal Pap tests. Testing can be stopped between 65 and 70 with 3 consecutive normal Pap tests and no abnormal Pap or HPV tests in the past 10 years.   HPV screening.** / Every 3 years from ages 53 through ages 25 or 71 with a history of 3 consecutive normal Pap tests. Testing can be stopped between 65 and 70 with 3 consecutive normal Pap tests and no abnormal Pap or HPV tests in the past 10 years.   Fecal occult blood test (FOBT) of stool. / Every year beginning at age 42 and continuing until age 24. You may not need to do this test if you get a colonoscopy every 10 years.   Flexible sigmoidoscopy or colonoscopy.** / Every 5 years for a flexible sigmoidoscopy or every 10 years for a colonoscopy beginning at age 56 and continuing until age 7.   Hepatitis C blood test.** / For all people born from [birth year] through 1967 and any individual with known risks for hepatitis C.   Osteoporosis screening.** / A one-time screening for women ages 64 and over and women at risk for fractures or osteoporosis.   Skin self-exam. / Monthly.   Influenza immunization.** / Every year.   Pneumococcal polysaccharide immunization.** / 1 dose at age 43 (or older) if you have never been vaccinated.   Tetanus, diphtheria, pertussis (Tdap, Td) immunization. / A one-time dose of Tdap vaccine if you are over 65 and have contact with an infant, are a Research scientist (physical sciences), or simply want to be protected from whooping cough. After that, you need a Td booster dose every 10 years.   Varicella immunization.** / Consult your caregiver.    Meningococcal immunization.** / Consult your caregiver.   Hepatitis A immunization.** / Consult your caregiver. 2 doses, 6 to 18 months apart.   Hepatitis B immunization.** / Check with your caregiver. 3 doses, usually over 6 months.  ** Family history and personal history of risk and conditions may change your caregiver's recommendations. Document Released: 05/17/2001 Document Revised: 06/13/2011 Document Reviewed: 08/16/2010 [facility] Patient Information 2013 [place], [place].

## 2012-02-21 NOTE — Progress Notes (Signed)
HPI  Pt presents to the clinic today to establish care. his she has no concerns at this time. She would just like someone to coordinate her care.  Past Medical History  Diagnosis Date  . Hypertension     Current Outpatient Prescriptions  Medication Sig Dispense Refill  . acetaminophen (TYLENOL) 500 MG tablet Take 500 mg by mouth as needed.      Marland Kitchen lisinopril-hydrochlorothiazide (PRINZIDE,ZESTORETIC) 20-25 MG per tablet Take 1 tablet by mouth daily.      . Multiple Vitamin (MULTIVITAMIN) tablet Take 1 tablet by mouth daily.        No Known Allergies  Family History  Problem Relation Age of Onset  . Hypertension Mother   . Hypertension Father   . Heart disease Father   . Cancer Sister     Breast Cancer  . Alcohol abuse Brother   . Cancer Brother     Throat Cancer    History   Social History  . Marital Status: Divorced    Spouse Name: N/A    Number of Children: N/A  . Years of Education: N/A   Occupational History  . Not on file.   Social History Main Topics  . Smoking status: Former Games developer  . Smokeless tobacco: Not on file  . Alcohol Use: 0.0 oz/week    0 Glasses of wine per week  . Drug Use: No  . Sexually Active: Not on file   Other Topics Concern  . Not on file   Social History Narrative   Regular exercise-yesCaffeine use-no    ROS:  Constitutional: Denies fever, malaise, fatigue, headache or abrupt weight changes.  HEENT: Denies eye pain, eye redness, ear pain, ringing in the ears, wax buildup, runny nose, nasal congestion, bloody nose, or sore throat. Respiratory:  Pt reports dry cough, nonproductive. Denies difficulty breathing, shortness of breath or sputum production.   Cardiovascular: Denies chest pain, chest tightness, palpitations or swelling in the hands or feet.  Gastrointestinal: Denies abdominal pain, bloating, constipation, diarrhea or blood in the stool.  GU: Denies frequency, urgency, pain with urination, blood in urine, odor or  discharge. Musculoskeletal: Pt reports generazliezed joint pain. Denies decrease in range of motion, difficulty with gait, muscle pain or joint swelling.  Skin: Denies redness, rashes, lesions or ulcercations.  Neurological: Denies dizziness, difficulty with memory, difficulty with speech or problems with balance and coordination.   No other specific complaints in a complete review of systems (except as listed in HPI above).  PE:  BP 134/88  Pulse 77  Temp 96.8 F (36 C) (Oral)  Ht 5\' 4"  (1.626 m)  Wt 251 lb (113.853 kg)  BMI 43.08 kg/m2  SpO2 97% Wt Readings from Last 3 Encounters:  02/21/12 251 lb (113.853 kg)  10/19/11 256 lb (116.121 kg)    General: Appears her stated age, obese but well developed, well nourished in NAD. HEENT: Head: normal shape and size; Eyes: sclera white, no icterus, conjunctiva pink, PERRLA and EOMs intact; Ears: Tm's gray and intact, normal light reflex; Nose: mucosa pink and moist, septum midline; Throat/Mouth: Teeth present, mucosa pink and moist, no lesions or ulcerations noted.  Neck: Normal range of motion. Neck supple, trachea midline. No massses, lumps or thyromegaly present.  Cardiovascular: Normal rate and rhythm. S1,S2 noted.  No murmur, rubs or gallops noted. No JVD or BLE edema. No carotid bruits noted. Pulmonary/Chest: Normal effort and positive vesicular breath sounds. No respiratory distress. No wheezes, rales or ronchi noted.  Abdomen: Soft and  nontender. Normal bowel sounds, no bruits noted. No distention or masses noted. Liver, spleen and kidneys non palpable. Musculoskeletal: Normal range of motion. No signs of joint swelling. No difficulty with gait.  Neurological: Alert and oriented. Cranial nerves II-XII intact. Coordination normal. +DTRs bilaterally. Psychiatric: Mood and affect normal. Behavior is normal. Judgment and thought content normal.    BMET    Component Value Date/Time   NA 137 05/11/2010 0455   K 3.9 05/11/2010 0455   CL  102 05/11/2010 0455   CO2 27 05/11/2010 0455   GLUCOSE 136* 05/11/2010 0455   BUN 8 05/11/2010 0455   CREATININE 0.89 05/11/2010 0455   CALCIUM 8.9 05/11/2010 0455   GFRNONAA >60 05/11/2010 0455   GFRAA  Value: >60        The eGFR has been calculated using the MDRD equation. This calculation has not been validated in all clinical situations. eGFR's persistently <60 mL/min signify possible Chronic Kidney Disease. 05/11/2010 0455    Lipid Panel  No results found for this basename: chol, trig, hdl, cholhdl, vldl, ldlcalc    CBC    Component Value Date/Time   WBC 14.1* 05/12/2010 0507   RBC 3.84* 05/12/2010 0507   HGB 10.1* 05/12/2010 0507   HCT 31.3* 05/12/2010 0507   PLT 185 05/12/2010 0507   MCV 81.5 05/12/2010 0507   MCH 26.3 05/12/2010 0507   MCHC 32.3 05/12/2010 0507   RDW 14.6 05/12/2010 0507   LYMPHSABS 3.8 05/06/2010 1230   MONOABS 0.8 05/06/2010 1230   EOSABS 0.1 05/06/2010 1230   BASOSABS 0.0 05/06/2010 1230    Hgb A1C No results found for this basename: HGBA1C     Assessment and Plan:  Preventative Health Maintenance:  Obtain basic screening labs: CBC, BMET., lipid panel, TSH, vitamin D and hemoglobin A1c Order for a mammogram placed at this visit. Patient has received her flu vaccine this year in September 2013. She will schedule a followup appointment to have her Pap smear done.  We will obtain her records from Dr. Paralee Cancel office to evaluate when her last tetanus shot was. She declines the Pneumovax vaccine and Zostavax vaccine at this visit.  Hypertension, chronic problem with additional workup required:  Refill her lisinopril-HCTZ 20-25 Daily  Cough, new onset with additional workup required:  Prescription given for Tessalon Perles 3 times a day as needed. Drink plenty of fluid and get plenty of rest.  Return to clinic in one year for your annual wellness visit or sooner if problems arise.

## 2012-02-22 LAB — VITAMIN D 25 HYDROXY (VIT D DEFICIENCY, FRACTURES): Vit D, 25-Hydroxy: 44 ng/mL (ref 30–89)

## 2012-03-09 ENCOUNTER — Ambulatory Visit (HOSPITAL_COMMUNITY)
Admission: RE | Admit: 2012-03-09 | Discharge: 2012-03-09 | Disposition: A | Discharge status: Home / Self Care | Payer: BC Managed Care – PPO | Source: Ambulatory Visit | Attending: Internal Medicine | Admitting: Internal Medicine

## 2012-03-09 DIAGNOSIS — Z1231 Encounter for screening mammogram for malignant neoplasm of breast: Secondary | ICD-10-CM

## 2012-03-09 DIAGNOSIS — Z1239 Encounter for other screening for malignant neoplasm of breast: Secondary | ICD-10-CM

## 2012-03-12 ENCOUNTER — Other Ambulatory Visit: Payer: Self-pay | Admitting: Internal Medicine

## 2012-03-12 DIAGNOSIS — N63 Unspecified lump in unspecified breast: Secondary | ICD-10-CM

## 2012-03-12 NOTE — Progress Notes (Signed)
Has suspicious mass on right breast per mammogram, will order ultrasound

## 2012-03-14 ENCOUNTER — Other Ambulatory Visit: Payer: Self-pay | Admitting: Internal Medicine

## 2012-03-14 DIAGNOSIS — R928 Other abnormal and inconclusive findings on diagnostic imaging of breast: Secondary | ICD-10-CM

## 2012-03-26 ENCOUNTER — Ambulatory Visit
Admission: RE | Admit: 2012-03-26 | Discharge: 2012-03-26 | Disposition: A | Discharge status: Home / Self Care | Payer: BC Managed Care – PPO | Source: Ambulatory Visit | Attending: Internal Medicine | Admitting: Internal Medicine

## 2012-03-26 DIAGNOSIS — N63 Unspecified lump in unspecified breast: Secondary | ICD-10-CM

## 2012-03-26 DIAGNOSIS — R928 Other abnormal and inconclusive findings on diagnostic imaging of breast: Secondary | ICD-10-CM

## 2012-03-26 LAB — HM MAMMOGRAPHY

## 2012-05-28 ENCOUNTER — Ambulatory Visit (INDEPENDENT_AMBULATORY_CARE_PROVIDER_SITE_OTHER): Payer: BC Managed Care – PPO | Admitting: Internal Medicine

## 2012-05-28 ENCOUNTER — Encounter: Payer: Self-pay | Admitting: Internal Medicine

## 2012-05-28 VITALS — BP 104/74 | HR 87 | Temp 97.0°F | Ht 64.0 in | Wt 246.8 lb

## 2012-05-28 DIAGNOSIS — E785 Hyperlipidemia, unspecified: Secondary | ICD-10-CM | POA: Insufficient documentation

## 2012-05-28 LAB — GLUCOSE, POCT (MANUAL RESULT ENTRY): POC Glucose: 119 mg/dl (ref 70–99)

## 2012-05-28 NOTE — Progress Notes (Signed)
Subjective:    Patient ID: Melissa Ball, female    DOB: 12-08-1950, 62 y.o.   MRN: [id number]  HPI  Pt presents to the clinic today to f/u hyperlipidemia. At her initial visit with [place], her total cholesterol and triglycerides were elevated. At that time, we decided to try 3 months of lifestyle changes in addition to OTC fish oil daily. She has not been very compliant with her diet but has started walking 3 x per week. She never did take the fish oil. She does not want to repeat lab testing at this visit.   Review of Systems      Past Medical History  Diagnosis Date  . Hypertension     Current Outpatient Prescriptions  Medication Sig Dispense Refill  . acetaminophen (TYLENOL) 500 MG tablet Take 500 mg by mouth as needed.      . benzonatate (TESSALON) 200 MG capsule Take 1 capsule (200 mg total) by mouth 2 (two) times daily as needed for cough.  20 capsule  0  . lisinopril-hydrochlorothiazide (PRINZIDE,ZESTORETIC) 20-25 MG per tablet Take 1 tablet by mouth daily.  30 tablet  11  . Multiple Vitamin (MULTIVITAMIN) tablet Take 1 tablet by mouth daily.       No current facility-administered medications for this visit.    No Known Allergies  Family History  Problem Relation Age of Onset  . Hypertension Mother   . Hypertension Father   . Heart disease Father   . Cancer Sister     Breast Cancer  . Alcohol abuse Brother   . Cancer Brother     Throat Cancer    History   Social History  . Marital Status: Divorced    Spouse Name: N/A    Number of Children: N/A  . Years of Education: N/A   Occupational History  . Not on file.   Social History Main Topics  . Smoking status: Former Games developer  . Smokeless tobacco: Not on file  . Alcohol Use: 0.0 oz/week    0 Glasses of wine per week  . Drug Use: No  . Sexually Active: Not on file   Other Topics Concern  . Not on file   Social History Narrative   Regular exercise-yes   Caffeine use-no           Constitutional:  Denies fever, malaise, fatigue, headache or abrupt weight changes.  Respiratory: Denies difficulty breathing, shortness of breath, cough or sputum production.   Cardiovascular: Denies chest pain, chest tightness, palpitations or swelling in the hands or feet.   Neurological: Denies dizziness, difficulty with memory, difficulty with speech or problems with balance and coordination.   No other specific complaints in a complete review of systems (except as listed in HPI above).  Objective:   Physical Exam  BP 104/74  Pulse 87  Temp(Src) 97 F (36.1 C) (Oral)  Ht 5\' 4"  (1.626 m)  Wt 246 lb 12 oz (111.925 kg)  BMI 42.33 kg/m2  SpO2 97% Wt Readings from Last 3 Encounters:  05/28/12 246 lb 12 oz (111.925 kg)  02/21/12 251 lb (113.853 kg)  10/19/11 256 lb (116.121 kg)    General: Appears her stated age, obese but well developed, well nourished in NAD. Cardiovascular: Normal rate and rhythm. S1,S2 noted.  No murmur, rubs or gallops noted. No JVD or BLE edema. No carotid bruits noted. Pulmonary/Chest: Normal effort and positive vesicular breath sounds. No respiratory distress. No wheezes, rales or ronchi noted.  Neurological: Alert and oriented. Cranial  nerves II-XII intact. Coordination normal. +DTRs bilaterally.   EKG:  BMET    Component Value Date/Time   NA 138 02/21/2012 1045   K 4.8 02/21/2012 1045   CL 105 02/21/2012 1045   CO2 27 02/21/2012 1045   GLUCOSE 88 02/21/2012 1045   BUN 17 02/21/2012 1045   CREATININE 0.8 02/21/2012 1045   CALCIUM 9.4 02/21/2012 1045   GFRNONAA >60 05/11/2010 0455   GFRAA  Value: >60        The eGFR has been calculated using the MDRD equation. This calculation has not been validated in all clinical situations. eGFR's persistently <60 mL/min signify possible Chronic Kidney Disease. 05/11/2010 0455    Lipid Panel     Component Value Date/Time   CHOL 208* 02/21/2012 1045   TRIG 197.0* 02/21/2012 1045   HDL 36.20* 02/21/2012 1045   CHOLHDL 6  02/21/2012 1045   VLDL 39.4 02/21/2012 1045    CBC    Component Value Date/Time   WBC 8.6 02/21/2012 1045   RBC 4.82 02/21/2012 1045   HGB 12.9 02/21/2012 1045   HCT 39.9 02/21/2012 1045   PLT 267.0 02/21/2012 1045   MCV 82.8 02/21/2012 1045   MCH 26.3 05/12/2010 0507   MCHC 32.2 02/21/2012 1045   RDW 15.9* 02/21/2012 1045   LYMPHSABS 3.8 05/06/2010 1230   MONOABS 0.8 05/06/2010 1230   EOSABS 0.1 05/06/2010 1230   BASOSABS 0.0 05/06/2010 1230    Hgb A1C Lab Results  Component Value Date   HGBA1C 5.8 02/21/2012         Assessment & Plan:   RTC in 3 months or sooner if needed

## 2012-05-28 NOTE — Assessment & Plan Note (Addendum)
Pt does not want lab work today Pt would like to start fish oil daily in addition to lifestyle changes for 3 more months Will recheck lipid profile in 3 months, If still elevated, will start statin therapy Continue to work on diet and exercise

## 2012-05-28 NOTE — Patient Instructions (Signed)

## 2012-08-20 ENCOUNTER — Encounter: Payer: Self-pay | Admitting: Internal Medicine

## 2012-08-20 ENCOUNTER — Ambulatory Visit (INDEPENDENT_AMBULATORY_CARE_PROVIDER_SITE_OTHER): Payer: BC Managed Care – PPO | Admitting: Internal Medicine

## 2012-08-20 ENCOUNTER — Other Ambulatory Visit (INDEPENDENT_AMBULATORY_CARE_PROVIDER_SITE_OTHER): Payer: BC Managed Care – PPO

## 2012-08-20 VITALS — BP 122/82 | HR 86 | Temp 97.8°F | Ht 64.0 in | Wt 249.0 lb

## 2012-08-20 DIAGNOSIS — M1810 Unilateral primary osteoarthritis of first carpometacarpal joint, unspecified hand: Secondary | ICD-10-CM

## 2012-08-20 DIAGNOSIS — M79609 Pain in unspecified limb: Secondary | ICD-10-CM

## 2012-08-20 DIAGNOSIS — M79646 Pain in unspecified finger(s): Secondary | ICD-10-CM

## 2012-08-20 DIAGNOSIS — M19049 Primary osteoarthritis, unspecified hand: Secondary | ICD-10-CM

## 2012-08-20 DIAGNOSIS — E785 Hyperlipidemia, unspecified: Secondary | ICD-10-CM

## 2012-08-20 LAB — LIPID PANEL: VLDL: 28.4 mg/dL (ref 0.0–40.0)

## 2012-08-20 NOTE — Progress Notes (Signed)
Subjective:    Patient ID: Melissa Ball, female    DOB: Sep 25, 1950, 62 y.o.   MRN: [id number]  HPI  Pt presents to the clinic today for 3 month follow up of high cholesterol. She has been trying to do better with her diet. She doesn't eat out as much and she is incorporating more vegetables into her diet. She has not started working out yet but does plan to join [employer] and start water aerobics and the sliver sneakers program. She denies chest pain, chest tightness or shortness of breath. Additionally today, she c/o pain in her bilateral thumbs. This has been an ongoing issue for her. She does take tylenol for the pain. It does seem to help. She feels like her joints are stiff and achy. She thinks she may have arthritis in her thumbs but she is not sure.  Review of Systems      Past Medical History  Diagnosis Date  . Hypertension     Current Outpatient Prescriptions  Medication Sig Dispense Refill  . acetaminophen (TYLENOL) 500 MG tablet Take 500 mg by mouth as needed.      Marland Kitchen lisinopril-hydrochlorothiazide (PRINZIDE,ZESTORETIC) 20-25 MG per tablet Take 1 tablet by mouth daily.  30 tablet  11  . Multiple Vitamin (MULTIVITAMIN) tablet Take 1 tablet by mouth daily.       No current facility-administered medications for this visit.    No Known Allergies  Family History  Problem Relation Age of Onset  . Hypertension Mother   . Hypertension Father   . Heart disease Father   . Cancer Sister     Breast Cancer  . Alcohol abuse Brother   . Cancer Brother     Throat Cancer    History   Social History  . Marital Status: Divorced    Spouse Name: N/A    Number of Children: N/A  . Years of Education: N/A   Occupational History  . Not on file.   Social History Main Topics  . Smoking status: Former Games developer  . Smokeless tobacco: Not on file  . Alcohol Use: 0.0 oz/week    0 Glasses of wine per week  . Drug Use: No  . Sexually Active: Not on file   Other Topics Concern   . Not on file   Social History Narrative   Regular exercise-yes   Caffeine use-no           Constitutional: Denies fever, malaise, fatigue, headache or abrupt weight changes.  Respiratory: Denies difficulty breathing, shortness of breath, cough or sputum production.   Cardiovascular: Denies chest pain, chest tightness, palpitations or swelling in the hands or feet.  Musculoskeletal: Pt reports pain in bilateral  Thumbs. Denies decrease in range of motion, difficulty with gait, muscle pain.  Neurological: Denies dizziness, difficulty with memory, difficulty with speech or problems with balance and coordination.   No other specific complaints in a complete review of systems (except as listed in HPI above).  Objective:   Physical Exam   BP 122/82  Pulse 86  Temp(Src) 97.8 F (36.6 C) (Oral)  Ht 5\' 4"  (1.626 m)  Wt 249 lb (112.946 kg)  BMI 42.72 kg/m2  SpO2 97% Wt Readings from Last 3 Encounters:  08/20/12 249 lb (112.946 kg)  05/28/12 246 lb 12 oz (111.925 kg)  02/21/12 251 lb (113.853 kg)    General: Appears her stated age, obese but well developed, well nourished in NAD.Marland Kitchen  Cardiovascular: Normal rate and rhythm. S1,S2 noted.  No murmur, rubs or gallops noted. No JVD or BLE edema. No carotid bruits noted. Pulmonary/Chest: Normal effort and positive vesicular breath sounds. No respiratory distress. No wheezes, rales or ronchi noted.  Musculoskeletal: Decreased flexion of bilateral thumbs. No signs of joint swelling. No difficulty with gait.  Neurological: Alert and oriented. Cranial nerves II-XII intact. Coordination normal. +DTRs bilaterally.       Assessment & Plan:  Bilateral thumb pain, secondary to arthritis:  Continue tylenol at this time Unable to take ibuprofen or aleve secondary to hiatal hernia

## 2012-08-20 NOTE — Patient Instructions (Signed)
Hypertriglyceridemia  Diet for High blood levels of Triglycerides Most fats in food are triglycerides. Triglycerides in your blood are stored as fat in your body. High levels of triglycerides in your blood may put you at a greater risk for heart disease and stroke.  Normal triglyceride levels are less than 150 mg/dL. Borderline high levels are 150-199 mg/dl. High levels are 200 - 499 mg/dL, and very high triglyceride levels are greater than 500 mg/dL. The decision to treat high triglycerides is generally based on the level. For people with borderline or high triglyceride levels, treatment includes weight loss and exercise. Drugs are recommended for people with very high triglyceride levels. Many people who need treatment for high triglyceride levels have metabolic syndrome. This syndrome is a collection of disorders that often include: insulin resistance, high blood pressure, blood clotting problems, high cholesterol and triglycerides. TESTING PROCEDURE FOR TRIGLYCERIDES  You should not eat 4 hours before getting your triglycerides measured. The normal range of triglycerides is between 10 and 250 milligrams per deciliter (mg/dl). Some people may have extreme levels (1000 or above), but your triglyceride level may be too high if it is above 150 mg/dl, depending on what other risk factors you have for heart disease.  People with high blood triglycerides may also have high blood cholesterol levels. If you have high blood cholesterol as well as high blood triglycerides, your risk for heart disease is probably greater than if you only had high triglycerides. High blood cholesterol is one of the main risk factors for heart disease. CHANGING YOUR DIET  Your weight can affect your blood triglyceride level. If you are more than 20% above your ideal body weight, you may be able to lower your blood triglycerides by losing weight. Eating less and exercising regularly is the best way to combat this. Fat provides more  calories than any other food. The best way to lose weight is to eat less fat. Only 30% of your total calories should come from fat. Less than 7% of your diet should come from saturated fat. A diet low in fat and saturated fat is the same as a diet to decrease blood cholesterol. By eating a diet lower in fat, you may lose weight, lower your blood cholesterol, and lower your blood triglyceride level.  Eating a diet low in fat, especially saturated fat, may also help you lower your blood triglyceride level. Ask your dietitian to help you figure how much fat you can eat based on the number of calories your caregiver has prescribed for you.  Exercise, in addition to helping with weight loss may also help lower triglyceride levels.   Alcohol can increase blood triglycerides. You may need to stop drinking alcoholic beverages.  Too much carbohydrate in your diet may also increase your blood triglycerides. Some complex carbohydrates are necessary in your diet. These may include bread, rice, potatoes, other starchy vegetables and cereals.  Reduce "simple" carbohydrates. These may include pure sugars, candy, honey, and jelly without losing other nutrients. If you have the kind of high blood triglycerides that is affected by the amount of carbohydrates in your diet, you will need to eat less sugar and less high-sugar foods. Your caregiver can help you with this.  Adding 2-4 grams of fish oil (EPA+ DHA) may also help lower triglycerides. Speak with your caregiver before adding any supplements to your regimen. Following the Diet  Maintain your ideal weight. Your caregivers can help you with a diet. Generally, eating less food and getting more   exercise will help you lose weight. Joining a weight control group may also help. Ask your caregivers for a good weight control group in your area.  Eat low-fat foods instead of high-fat foods. This can help you lose weight too.  These foods are lower in fat. Eat MORE of these:    Dried beans, peas, and lentils.  Egg whites.  Low-fat cottage cheese.  Fish.  Lean cuts of meat, such as round, sirloin, rump, and flank (cut extra fat off meat you fix).  Whole grain breads, cereals and pasta.  Skim and nonfat dry milk.  Low-fat yogurt.  Poultry without the skin.  Cheese made with skim or part-skim milk, such as mozzarella, parmesan, farmers', ricotta, or pot cheese. These are higher fat foods. Eat LESS of these:   Whole milk and foods made from whole milk, such as American, blue, cheddar, monterey jack, and swiss cheese  High-fat meats, such as luncheon meats, sausages, knockwurst, bratwurst, hot dogs, ribs, corned beef, ground pork, and regular ground beef.  Fried foods. Limit saturated fats in your diet. Substituting unsaturated fat for saturated fat may decrease your blood triglyceride level. You will need to read package labels to know which products contain saturated fats.  These foods are high in saturated fat. Eat LESS of these:   Fried pork skins.  Whole milk.  Skin and fat from poultry.  Palm oil.  Butter.  Shortening.  Cream cheese.  Bacon.  Margarines and baked goods made from listed oils.  Vegetable shortenings.  Chitterlings.  Fat from meats.  Coconut oil.  Palm kernel oil.  Lard.  Cream.  Sour cream.  Fatback.  Coffee whiteners and non-dairy creamers made with these oils.  Cheese made from whole milk. Use unsaturated fats (both polyunsaturated and monounsaturated) moderately. Remember, even though unsaturated fats are better than saturated fats; you still want a diet low in total fat.  These foods are high in unsaturated fat:   Canola oil.  Sunflower oil.  Mayonnaise.  Almonds.  Peanuts.  Pine nuts.  Margarines made with these oils.  Safflower oil.  Olive oil.  Avocados.  Cashews.  Peanut butter.  Sunflower seeds.  Soybean oil.  Peanut  oil.  Olives.  Pecans.  Walnuts.  Pumpkin seeds. Avoid sugar and other high-sugar foods. This will decrease carbohydrates without decreasing other nutrients. Sugar in your food goes rapidly to your blood. When there is excess sugar in your blood, your liver may use it to make more triglycerides. Sugar also contains calories without other important nutrients.  Eat LESS of these:   Sugar, brown sugar, powdered sugar, jam, jelly, preserves, honey, syrup, molasses, pies, candy, cakes, cookies, frosting, pastries, colas, soft drinks, punches, fruit drinks, and regular gelatin.  Avoid alcohol. Alcohol, even more than sugar, may increase blood triglycerides. In addition, alcohol is high in calories and low in nutrients. Ask for sparkling water, or a diet soft drink instead of an alcoholic beverage. Suggestions for planning and preparing meals   Bake, broil, grill or roast meats instead of frying.  Remove fat from meats and skin from poultry before cooking.  Add spices, herbs, lemon juice or vinegar to vegetables instead of salt, rich sauces or gravies.  Use a non-stick skillet without fat or use no-stick sprays.  Cool and refrigerate stews and broth. Then remove the hardened fat floating on the surface before serving.  Refrigerate meat drippings and skim off fat to make low-fat gravies.  Serve more fish.  Use less butter,   margarine and other high-fat spreads on bread or vegetables.  Use skim or reconstituted non-fat dry milk for cooking.  Cook with low-fat cheeses.  Substitute low-fat yogurt or cottage cheese for all or part of the sour cream in recipes for sauces, dips or congealed salads.  Use half yogurt/half mayonnaise in salad recipes.  Substitute evaporated skim milk for cream. Evaporated skim milk or reconstituted non-fat dry milk can be whipped and substituted for whipped cream in certain recipes.  Choose fresh fruits for dessert instead of high-fat foods such as pies or  cakes. Fruits are naturally low in fat. When Dining Out   Order low-fat appetizers such as fruit or vegetable juice, pasta with vegetables or tomato sauce.  Select clear, rather than cream soups.  Ask that dressings and gravies be served on the side. Then use less of them.  Order foods that are baked, broiled, poached, steamed, stir-fried, or roasted.  Ask for margarine instead of butter, and use only a small amount.  Drink sparkling water, unsweetened tea or coffee, or diet soft drinks instead of alcohol or other sweet beverages. QUESTIONS AND ANSWERS ABOUT OTHER FATS IN THE BLOOD: SATURATED FAT, TRANS FAT, AND CHOLESTEROL What is trans fat? Trans fat is a type of fat that is formed when vegetable oil is hardened through a process called hydrogenation. This process helps makes foods more solid, gives them shape, and prolongs their shelf life. Trans fats are also called hydrogenated or partially hydrogenated oils.  What do saturated fat, trans fat, and cholesterol in foods have to do with heart disease? Saturated fat, trans fat, and cholesterol in the diet all raise the level of LDL "bad" cholesterol in the blood. The higher the LDL cholesterol, the greater the risk for coronary heart disease (CHD). Saturated fat and trans fat raise LDL similarly.  What foods contain saturated fat, trans fat, and cholesterol? High amounts of saturated fat are found in animal products, such as fatty cuts of meat, chicken skin, and full-fat dairy products like butter, whole milk, cream, and cheese, and in tropical vegetable oils such as palm, palm kernel, and coconut oil. Trans fat is found in some of the same foods as saturated fat, such as vegetable shortening, some margarines (especially hard or stick margarine), crackers, cookies, baked goods, fried foods, salad dressings, and other processed foods made with partially hydrogenated vegetable oils. Small amounts of trans fat also occur naturally in some animal  products, such as milk products, beef, and lamb. Foods high in cholesterol include liver, other organ meats, egg yolks, shrimp, and full-fat dairy products. How can I use the new food label to make heart-healthy food choices? Check the Nutrition Facts panel of the food label. Choose foods lower in saturated fat, trans fat, and cholesterol. For saturated fat and cholesterol, you can also use the Percent Daily Value (%DV): 5% DV or less is low, and 20% DV or more is high. (There is no %DV for trans fat.) Use the Nutrition Facts panel to choose foods low in saturated fat and cholesterol, and if the trans fat is not listed, read the ingredients and limit products that list shortening or hydrogenated or partially hydrogenated vegetable oil, which tend to be high in trans fat. POINTS TO REMEMBER:   Discuss your risk for heart disease with your caregivers, and take steps to reduce risk factors.  Change your diet. Choose foods that are low in saturated fat, trans fat, and cholesterol.  Add exercise to your daily routine if   it is not already being done. Participate in physical activity of moderate intensity, like brisk walking, for at least 30 minutes on most, and preferably all days of the week. No time? Break the 30 minutes into three, 10-minute segments during the day.  Stop smoking. If you do smoke, contact your caregiver to discuss ways in which they can help you quit.  Do not use street drugs.  Maintain a normal weight.  Maintain a healthy blood pressure.  Keep up with your blood work for checking the fats in your blood as directed by your caregiver. Document Released: 01/07/2004 Document Revised: 09/20/2011 Document Reviewed: 08/04/2008 [employer] Patient Information 2013 [employer].  

## 2012-08-20 NOTE — Assessment & Plan Note (Signed)
Will recheck lipid profile today If elevated will need to start statin therapy Pt has not been taking fish oil supplements Encouraged pt to work on her diet and exercise

## 2012-08-21 ENCOUNTER — Other Ambulatory Visit: Payer: Self-pay | Admitting: Internal Medicine

## 2012-08-21 MED ORDER — ATORVASTATIN CALCIUM 10 MG PO TABS: 10.0000 mg | ORAL_TABLET | Freq: Every day | ORAL | Status: DC

## 2012-11-19 ENCOUNTER — Ambulatory Visit: Payer: BC Managed Care – PPO | Admitting: Internal Medicine

## 2012-11-19 DIAGNOSIS — Z0289 Encounter for other administrative examinations: Secondary | ICD-10-CM

## 2012-12-27 ENCOUNTER — Encounter: Payer: Self-pay | Admitting: Internal Medicine

## 2012-12-27 ENCOUNTER — Ambulatory Visit (INDEPENDENT_AMBULATORY_CARE_PROVIDER_SITE_OTHER): Payer: BC Managed Care – PPO | Admitting: Internal Medicine

## 2012-12-27 VITALS — BP 110/74 | HR 87 | Temp 97.0°F | Wt 251.0 lb

## 2012-12-27 DIAGNOSIS — M76899 Other specified enthesopathies of unspecified lower limb, excluding foot: Secondary | ICD-10-CM

## 2012-12-27 DIAGNOSIS — K5900 Constipation, unspecified: Secondary | ICD-10-CM

## 2012-12-27 DIAGNOSIS — Z23 Encounter for immunization: Secondary | ICD-10-CM

## 2012-12-27 DIAGNOSIS — M7061 Trochanteric bursitis, right hip: Secondary | ICD-10-CM

## 2012-12-27 NOTE — Patient Instructions (Signed)

## 2012-12-27 NOTE — Progress Notes (Signed)
Subjective:    Patient ID: Melissa Ball, female    DOB: 12-31-1950, 62 y.o.   MRN: [id number]  HPI  Pt presents to the clinic today with c/o right hip pain. This started 1 week ago. It seems to be worse when she is walking, or laying on her right side. She has never had hip pain like this before. She denies any injury to the area. She has taken tylenol OTC which has helped. She also reports that she had someone pray over her on Sunday with a marked improvement in hip pain since that time. She denies numbness or tingling in her foot/leg. Additionally, she c/o constipation. She has not had a BM in 2 days. She did have to take Mag Citrate 2 days ago to have a BM. She has had issues with constipation in the past. She tries to incorporate fiber into her diet. She is wondering what else she can do.  Review of Systems      Past Medical History  Diagnosis Date  . Hypertension     Current Outpatient Prescriptions  Medication Sig Dispense Refill  . acetaminophen (TYLENOL) 500 MG tablet Take 500 mg by mouth as needed.      Marland Kitchen atorvastatin (LIPITOR) 10 MG tablet Take 1 tablet (10 mg total) by mouth daily.  30 tablet  2  . lisinopril-hydrochlorothiazide (PRINZIDE,ZESTORETIC) 20-25 MG per tablet Take 1 tablet by mouth daily.  30 tablet  11  . Multiple Vitamin (MULTIVITAMIN) tablet Take 1 tablet by mouth daily.       No current facility-administered medications for this visit.    No Known Allergies  Family History  Problem Relation Age of Onset  . Hypertension Mother   . Hypertension Father   . Heart disease Father   . Cancer Sister     Breast Cancer  . Alcohol abuse Brother   . Cancer Brother     Throat Cancer    History   Social History  . Marital Status: Divorced    Spouse Name: N/A    Number of Children: N/A  . Years of Education: N/A   Occupational History  . Not on file.   Social History Main Topics  . Smoking status: Former Games developer  . Smokeless tobacco: Not on file   . Alcohol Use: 0.0 oz/week    0 Glasses of wine per week  . Drug Use: No  . Sexual Activity: Not on file   Other Topics Concern  . Not on file   Social History Narrative   Regular exercise-yes   Caffeine use-no           Constitutional: Denies fever, malaise, fatigue, headache or abrupt weight changes.  Gastrointestinal: Pt reports constipation. Denies abdominal pain, bloating, diarrhea or blood in the stool.  Musculoskeletal: Pt reports right hip pain. Denies decrease in range of motion, difficulty with gait, muscle pain or joint pain and swelling.   No other specific complaints in a complete review of systems (except as listed in HPI above).  Objective:   Physical Exam  BP 110/74  Pulse 87  Temp(Src) 97 F (36.1 C) (Oral)  Wt 251 lb (113.853 kg)  BMI 43.06 kg/m2  SpO2 97% Wt Readings from Last 3 Encounters:  12/27/12 251 lb (113.853 kg)  08/20/12 249 lb (112.946 kg)  05/28/12 246 lb 12 oz (111.925 kg)    General: Appears her stated age, obese but well developed, well nourished in NAD. Cardiovascular: Normal rate and rhythm. S1,S2 noted.  No murmur, rubs or gallops noted. No JVD or BLE edema. No carotid bruits noted. Pulmonary/Chest: Normal effort and positive vesicular breath sounds. No respiratory distress. No wheezes, rales or ronchi noted.  Abdomen: Soft and nontender. Normal bowel sounds, no bruits noted. No distention or masses noted. Liver, spleen and kidneys non palpable. Musculoskeletal: Normal range of motion. No signs of joint swelling. No difficulty with gait. Slightly tender to palpation over right trochanter.   BMET    Component Value Date/Time   NA 138 02/21/2012 1045   K 4.8 02/21/2012 1045   CL 105 02/21/2012 1045   CO2 27 02/21/2012 1045   GLUCOSE 88 02/21/2012 1045   BUN 17 02/21/2012 1045   CREATININE 0.8 02/21/2012 1045   CALCIUM 9.4 02/21/2012 1045   GFRNONAA >60 05/11/2010 0455   GFRAA  Value: >60        The eGFR has been calculated  using the MDRD equation. This calculation has not been validated in all clinical situations. eGFR's persistently <60 mL/min signify possible Chronic Kidney Disease. 05/11/2010 0455    Lipid Panel     Component Value Date/Time   CHOL 216* 08/20/2012 1030   TRIG 142.0 08/20/2012 1030   HDL 35.40* 08/20/2012 1030   CHOLHDL 6 08/20/2012 1030   VLDL 28.4 08/20/2012 1030    CBC    Component Value Date/Time   WBC 8.6 02/21/2012 1045   RBC 4.82 02/21/2012 1045   HGB 12.9 02/21/2012 1045   HCT 39.9 02/21/2012 1045   PLT 267.0 02/21/2012 1045   MCV 82.8 02/21/2012 1045   MCH 26.3 05/12/2010 0507   MCHC 32.2 02/21/2012 1045   RDW 15.9* 02/21/2012 1045   LYMPHSABS 3.8 05/06/2010 1230   MONOABS 0.8 05/06/2010 1230   EOSABS 0.1 05/06/2010 1230   BASOSABS 0.0 05/06/2010 1230    Hgb A1C Lab Results  Component Value Date   HGBA1C 5.8 02/21/2012            Assessment & Plan:   Right hip trochanteric bursitis, new onset:  Unable to take NSAIDS secondary to hiatal hernia If pain comes back or gets worse, consider cortisone injection in hip with Dr. Katrinka Blazing Continue tylenol as needed  Constipation, chronic:  Try adding Mirilax to your daily regimen to see if this helps  RTC as needed or if hip pain returns or unable to have a BM for 3-5 days

## 2013-02-14 ENCOUNTER — Other Ambulatory Visit: Payer: Self-pay | Admitting: Internal Medicine

## 2013-08-14 ENCOUNTER — Other Ambulatory Visit: Payer: Self-pay | Admitting: Internal Medicine

## 2013-08-14 ENCOUNTER — Telehealth: Payer: Self-pay | Admitting: Internal Medicine

## 2013-08-14 ENCOUNTER — Ambulatory Visit (INDEPENDENT_AMBULATORY_CARE_PROVIDER_SITE_OTHER): Payer: BC Managed Care – PPO | Admitting: Internal Medicine

## 2013-08-14 ENCOUNTER — Ambulatory Visit (INDEPENDENT_AMBULATORY_CARE_PROVIDER_SITE_OTHER)
Admission: RE | Admit: 2013-08-14 | Discharge: 2013-08-14 | Disposition: A | Discharge status: Home / Self Care | Payer: BC Managed Care – PPO | Source: Ambulatory Visit | Attending: Internal Medicine | Admitting: Internal Medicine

## 2013-08-14 ENCOUNTER — Encounter: Payer: Self-pay | Admitting: Internal Medicine

## 2013-08-14 ENCOUNTER — Other Ambulatory Visit (INDEPENDENT_AMBULATORY_CARE_PROVIDER_SITE_OTHER): Payer: BC Managed Care – PPO

## 2013-08-14 VITALS — BP 112/74 | HR 96 | Temp 97.9°F | Wt 233.0 lb

## 2013-08-14 DIAGNOSIS — Z23 Encounter for immunization: Secondary | ICD-10-CM

## 2013-08-14 DIAGNOSIS — K5900 Constipation, unspecified: Secondary | ICD-10-CM

## 2013-08-14 DIAGNOSIS — M25511 Pain in right shoulder: Secondary | ICD-10-CM

## 2013-08-14 DIAGNOSIS — Z0000 Encounter for general adult medical examination without abnormal findings: Secondary | ICD-10-CM

## 2013-08-14 DIAGNOSIS — M25519 Pain in unspecified shoulder: Secondary | ICD-10-CM

## 2013-08-14 DIAGNOSIS — I10 Essential (primary) hypertension: Secondary | ICD-10-CM

## 2013-08-14 DIAGNOSIS — E785 Hyperlipidemia, unspecified: Secondary | ICD-10-CM

## 2013-08-14 DIAGNOSIS — R922 Inconclusive mammogram: Secondary | ICD-10-CM

## 2013-08-14 DIAGNOSIS — K219 Gastro-esophageal reflux disease without esophagitis: Secondary | ICD-10-CM | POA: Insufficient documentation

## 2013-08-14 LAB — CBC WITH DIFFERENTIAL/PLATELET
BASOS PCT: 0.4 % (ref 0.0–3.0)
Basophils Absolute: 0 10*3/uL (ref 0.0–0.1)
EOS ABS: 0.1 10*3/uL (ref 0.0–0.7)
Eosinophils Relative: 0.5 % (ref 0.0–5.0)
HCT: 35.8 % — ABNORMAL LOW (ref 36.0–46.0)
HEMOGLOBIN: 11.8 g/dL — AB (ref 12.0–15.0)
Lymphocytes Relative: 22.8 % (ref 12.0–46.0)
Lymphs Abs: 2.2 10*3/uL (ref 0.7–4.0)
MCHC: 32.8 g/dL (ref 30.0–36.0)
MCV: 78.3 fl (ref 78.0–100.0)
MONO ABS: 0.7 10*3/uL (ref 0.1–1.0)
Monocytes Relative: 7.3 % (ref 3.0–12.0)
NEUTROS ABS: 6.8 10*3/uL (ref 1.4–7.7)
Neutrophils Relative %: 69 % (ref 43.0–77.0)
Platelets: 331 10*3/uL (ref 150.0–400.0)
RBC: 4.57 Mil/uL (ref 3.87–5.11)
RDW: 15.9 % — AB (ref 11.5–15.5)
WBC: 9.8 10*3/uL (ref 4.0–10.5)

## 2013-08-14 LAB — URINALYSIS, ROUTINE W REFLEX MICROSCOPIC
BILIRUBIN URINE: NEGATIVE
KETONES UR: NEGATIVE
Leukocytes, UA: NEGATIVE
Nitrite: NEGATIVE
Specific Gravity, Urine: 1.025 (ref 1.000–1.030)
TOTAL PROTEIN, URINE-UPE24: NEGATIVE
URINE GLUCOSE: NEGATIVE
Urobilinogen, UA: 0.2 (ref 0.0–1.0)
pH: 5.5 (ref 5.0–8.0)

## 2013-08-14 LAB — LIPID PANEL
CHOL/HDL RATIO: 6
Cholesterol: 205 mg/dL — ABNORMAL HIGH (ref 0–200)
HDL: 34.1 mg/dL — ABNORMAL LOW (ref >39.00)
LDL CALC: 140 mg/dL — AB (ref 0–99)
TRIGLYCERIDES: 155 mg/dL — AB (ref 0.0–149.0)
VLDL: 31 mg/dL (ref 0.0–40.0)

## 2013-08-14 LAB — HEPATIC FUNCTION PANEL
ALK PHOS: 72 U/L (ref 39–117)
ALT: 31 U/L (ref 0–35)
AST: 32 U/L (ref 0–37)
Albumin: 3.8 g/dL (ref 3.5–5.2)
BILIRUBIN DIRECT: 0 mg/dL (ref 0.0–0.3)
BILIRUBIN TOTAL: 0.5 mg/dL (ref 0.2–1.2)
Total Protein: 8 g/dL (ref 6.0–8.3)

## 2013-08-14 LAB — BASIC METABOLIC PANEL
BUN: 22 mg/dL (ref 6–23)
CALCIUM: 9.9 mg/dL (ref 8.4–10.5)
CHLORIDE: 103 meq/L (ref 96–112)
CO2: 25 mEq/L (ref 19–32)
CREATININE: 0.9 mg/dL (ref 0.4–1.2)
GFR: 78.94 mL/min (ref >60.00)
Glucose, Bld: 92 mg/dL (ref 70–99)
Potassium: 4 mEq/L (ref 3.5–5.1)
SODIUM: 137 meq/L (ref 135–145)

## 2013-08-14 LAB — TSH: TSH: 1.2 u[IU]/mL (ref 0.35–4.50)

## 2013-08-14 MED ORDER — ATORVASTATIN CALCIUM 10 MG PO TABS: 10.0000 mg | ORAL_TABLET | Freq: Every day | ORAL | Status: DC

## 2013-08-14 MED ORDER — ASPIRIN EC 81 MG PO TBEC: 81.0000 mg | DELAYED_RELEASE_TABLET | Freq: Every day | ORAL | Status: DC

## 2013-08-14 MED ORDER — LISINOPRIL-HYDROCHLOROTHIAZIDE 20-25 MG PO TABS: ORAL_TABLET | ORAL | Status: DC

## 2013-08-14 NOTE — Assessment & Plan Note (Signed)
Was taking statin but ran out, to re-start,  to f/u any worsening symptoms or concerns

## 2013-08-14 NOTE — Assessment & Plan Note (Signed)
stable overall by history and exam, recent data reviewed with pt, and pt to continue medical treatment as before,  to f/u any worsening symptoms or concerns. BP Readings from Last 3 Encounters:  08/14/13 112/74  12/27/12 110/74  08/20/12 122/82

## 2013-08-14 NOTE — Assessment & Plan Note (Signed)
[place] for [employer] prn,  to f/u any worsening symptoms or concerns

## 2013-08-14 NOTE — Telephone Encounter (Signed)
Patient informed to take 81 mg asa.  The patient stated she cannot take as she has a hernia

## 2013-08-14 NOTE — Patient Instructions (Addendum)
You had the tetanus shot today (Tdap)  Please continue all other medications as before, and refills have been done if requested. Please have the pharmacy call with any other refills you may need.  You can also take Tylenol OTC for the pain  OK to continue the OTC laxatives as you have been doing  Please go to the XRAY Department in the Basement (go straight as you get off the elevator) for the x-ray testing  Please go to the LAB in the Basement (turn left off the elevator) for the tests to be done today  You will be contacted by phone if any changes need to be made immediately.  Otherwise, you will receive a letter about your results with an explanation, but please check with MyChart first.  Please keep your appointments with your specialists as you may have planned -   You will be contacted regarding the referral for: Dr Smith/sport medicine, a GYN referral for routine exam, and screening mammogram  Please return in 6 months, or sooner if needed

## 2013-08-14 NOTE — Telephone Encounter (Signed)
I think she is referring to a hiatal hernia.   OK to take the ASA even despite this.  Also ok to the ASA as long as it has Enteric Coated if she has hx of ulcers or gastritis

## 2013-08-14 NOTE — Progress Notes (Signed)
Subjective:    Patient ID: Melissa Ball, female    DOB: 1950/09/27, 63 y.o.   MRN: [id number]  HPI Here for wellness and f/u;  Overall doing ok;  Pt denies CP, worsening SOB, DOE, wheezing, orthopnea, PND, worsening LE edema, palpitations, dizziness or syncope.  Pt denies neurological change such as new headache, facial or extremity weakness.  Pt denies polydipsia, polyuria, or low sugar symptoms. Pt states overall good compliance with treatment and medications, good tolerability, and has been trying to follow lower cholesterol diet.  Pt denies worsening depressive symptoms, suicidal ideation or panic. No fever, night sweats, wt loss, loss of appetite, or other constitutional symptoms.  Pt states good ability with ADL's, has low fall risk, home safety reviewed and adequate, no other significant changes in hearing or vision, and only occasionally active with exercise.  C/o right shoulder pain, worse with deep breaths and laying on right side, some popping and decreased ROM for several motnhs.  Also has some achy crampylower abd pains occas, has ongoing constipation mag citrate works ok,.  Also with occas reflux, takes rolaids prn.  Past Medical History  Diagnosis Date  . Hypertension   . Other and unspecified hyperlipidemia 05/28/2012   Past Surgical History  Procedure Laterality Date  . Replacement total knee Left     reports that she has quit smoking. She does not have any smokeless tobacco history on file. She reports that she drinks alcohol. She reports that she does not use illicit drugs. family history includes Alcohol abuse in her brother; Cancer in her brother and sister; Heart disease in her father; Hypertension in her father and mother. No Known Allergies Current Outpatient Prescriptions on File Prior to Visit  Medication Sig Dispense Refill  . acetaminophen (TYLENOL) 500 MG tablet Take 500 mg by mouth as needed.      Marland Kitchen lisinopril-hydrochlorothiazide (PRINZIDE,ZESTORETIC) 20-25 MG  per tablet TAKE 1 TABLET BY MOUTH DAILY.  30 tablet  11  . Multiple Vitamin (MULTIVITAMIN) tablet Take 1 tablet by mouth daily.      Marland Kitchen atorvastatin (LIPITOR) 10 MG tablet Take 1 tablet (10 mg total) by mouth daily.  30 tablet  2   No current facility-administered medications on file prior to visit.   Review of Systems Constitutional: Negative for increased diaphoresis, other activity, appetite or other siginficant weight change  HENT: Negative for worsening hearing loss, ear pain, facial swelling, mouth sores and neck stiffness.   Eyes: Negative for other worsening pain, redness or visual disturbance.  Respiratory: Negative for shortness of breath and wheezing.   Cardiovascular: Negative for chest pain and palpitations.  Gastrointestinal: Negative for diarrhea, blood in stool, abdominal distention or other pain Genitourinary: Negative for hematuria, flank pain or change in urine volume.  Musculoskeletal: Negative for myalgias or other joint complaints.  Skin: Negative for color change and wound.  Neurological: Negative for syncope and numbness. other than noted Hematological: Negative for adenopathy. or other swelling Psychiatric/Behavioral: Negative for hallucinations, self-injury, decreased concentration or other worsening agitation.      Objective:   Physical Exam BP 112/74  Pulse 96  Temp(Src) 97.9 F (36.6 C) (Oral)  Wt 233 lb (105.688 kg)  SpO2 98% VS noted,  Constitutional: Pt is oriented to person, place, and time. Appears well-developed and well-nourished.  Head: Normocephalic and atraumatic.  Right Ear: External ear normal.  Left Ear: External ear normal.  Nose: Nose normal.  Mouth/Throat: Oropharynx is clear and moist.  Eyes: Conjunctivae and EOM are  normal. Pupils are equal, round, and reactive to light.  Neck: Normal range of motion. Neck supple. No JVD present. No tracheal deviation present.  Cardiovascular: Normal rate, regular rhythm, normal heart sounds and  intact distal pulses.   Pulmonary/Chest: Effort normal and breath sounds without rales or wheezing  Abdominal: Soft. Bowel sounds are normal. NT. No HSM  Musculoskeletal: Normal range of motion. Exhibits no edema.  Lymphadenopathy:  Has no cervical adenopathy.  Neurological: Pt is alert and oriented to person, place, and time. Pt has normal reflexes. No cranial nerve deficit. Motor grossly intact Right shoudler with reduced ROM to abduction, has tender right trapezoid area Skin: Skin is warm and dry. No rash noted.  Psychiatric:  Has mild nervous mood and affect. Behavior is normal.      Assessment & Plan:

## 2013-08-14 NOTE — Telephone Encounter (Signed)
Melissa Ball also to let pt know -   Please start asa 81 mg per day, to help reduce risk of heart disease and stroke

## 2013-08-14 NOTE — Assessment & Plan Note (Addendum)
Exam benign, for labs today, also cont otc cathartics prn, not likely related to statin as she has not been taking recently

## 2013-08-14 NOTE — Assessment & Plan Note (Signed)
?   Strain vs DJD or other; has unsuual pleuritic component, for cxr, also right shoulder xray, tyelnol prn, refer Dr Smith/sport med in this office

## 2013-08-14 NOTE — Addendum Note (Signed)
Addended by: Earnstine Regal on: 08/14/2013 09:42 AM   Modules accepted: Orders

## 2013-08-14 NOTE — Progress Notes (Signed)
Pre visit review using our clinic review tool, if applicable. No additional management support is needed unless otherwise documented below in the visit note. 

## 2013-08-14 NOTE — Assessment & Plan Note (Signed)

## 2013-08-15 NOTE — Telephone Encounter (Signed)
Patient informed of MD instructions on ASA.  Patient agreed to take.

## 2013-08-15 NOTE — Telephone Encounter (Signed)
Called left message to call back 

## 2013-08-16 ENCOUNTER — Ambulatory Visit: Payer: BC Managed Care – PPO | Admitting: Internal Medicine

## 2013-08-21 ENCOUNTER — Ambulatory Visit: Payer: BC Managed Care – PPO | Admitting: Family Medicine

## 2013-08-29 ENCOUNTER — Other Ambulatory Visit (INDEPENDENT_AMBULATORY_CARE_PROVIDER_SITE_OTHER): Payer: BC Managed Care – PPO

## 2013-08-29 ENCOUNTER — Ambulatory Visit (INDEPENDENT_AMBULATORY_CARE_PROVIDER_SITE_OTHER): Payer: BC Managed Care – PPO | Admitting: Family Medicine

## 2013-08-29 ENCOUNTER — Encounter: Payer: Self-pay | Admitting: Family Medicine

## 2013-08-29 VITALS — BP 116/82 | HR 93 | Ht 64.0 in | Wt 231.0 lb

## 2013-08-29 DIAGNOSIS — M25519 Pain in unspecified shoulder: Secondary | ICD-10-CM

## 2013-08-29 DIAGNOSIS — M25511 Pain in right shoulder: Secondary | ICD-10-CM

## 2013-08-29 DIAGNOSIS — M19019 Primary osteoarthritis, unspecified shoulder: Secondary | ICD-10-CM

## 2013-08-29 MED ORDER — TRIAMCINOLONE ACETONIDE 40 MG/ML IJ SUSP
40.0000 mg | Freq: Once | INTRAMUSCULAR | Status: AC
  Administered 2013-08-29: 40 mg via INTRA_ARTICULAR

## 2013-08-29 NOTE — Assessment & Plan Note (Signed)
The patient was given an injection today. Patient tolerated the procedure very well and had significant reduction in pain. Patient was given a home exercise program that I think will be beneficial. We also discussed over-the-counter medications that could be helpful. Patient is going to try these interventions and come back again in 3 weeks for further evaluation.

## 2013-08-29 NOTE — Patient Instructions (Signed)
Very nice to meet you Take tylenol 650 mg three times a day is the best evidence based medicine we have for arthritis.  Glucosamine sulfate 750mg  twice a day is a supplement that has been shown to help moderate to severe arthritis. Vitamin D 2000 IU daily Fish oil 2 grams daily.  Tumeric 500mg  twice daily.  Capsaicin topically up to four times a day may also help with pain. Cortisone injections are an option if these interventions do not seem to make a difference or need more relief.  It's important that you continue to stay active.  Try exercises 3 times a week.  Controlling your weight is important.  Consider physical therapy to strengthen muscles around the joint that hurts to take pressure off of the joint itself. Water aerobics and cycling with low resistance are the best two types of exercise for arthritis. For your cough try teaspoon of honey up to 2 times daily especially before bed.  Consider a anti-histamine like zyrtec because cough is likely allergies. If it continues see Dr. Jenny Reichmann.  Come back and see me in 3 weeks.

## 2013-08-29 NOTE — Progress Notes (Signed)
[facility] [address], [place] [address] Phone: [phone] Subjective:    I'm seeing this patient by the request  of:  Cathlean Cower, MD   CC: Right shoulder pain  [id number] Melissa Ball is a 63 y.o. female coming in with complaint of right shoulder pain. Patient states that she has had this pain for multiple months and states that it is an insidious onset. Patient describes it is more of a dull aching pain a can of a sharp sensation with certain movements. Patient on the difficult reaching overhead or behind her back sometimes. Patient was seen by primary provider and was sent for x-rays. X-rays reviewed by me today shows the patient does have moderate osteoarthritic changes. Patient denies any significant weakness but the pain can radiate down her arm some time. Patient denies any neck pain. States the severity is 8/10.     Past medical history, social, surgical and family history all reviewed in electronic medical record.   Review of Systems: No headache, visual changes, nausea, vomiting, diarrhea, constipation, dizziness, abdominal pain, skin rash, fevers, chills, night sweats, weight loss, swollen lymph nodes, body aches, joint swelling, muscle aches, chest pain, shortness of breath, mood changes.   Objective Blood pressure 116/82, pulse 93, height 5\' 4"  (1.626 m), weight 231 lb (104.781 kg), SpO2 95.00%.  General: No apparent distress alert and oriented x3 mood and affect normal, dressed appropriately.  HEENT: Pupils equal, extraocular movements intact  Respiratory: Patient's speak in full sentences and does not appear short of breath  Cardiovascular: No lower extremity edema, non tender, no erythema  Skin: Warm dry intact with no signs of infection or rash on extremities or on axial skeleton.  Abdomen: Soft nontender  Neuro: Cranial nerves II through XII are intact, neurovascularly intact in all extremities with 2+ DTRs and 2+ pulses.   Lymph: No lymphadenopathy of posterior or anterior cervical chain or axillae bilaterally.  Gait normal with good balance and coordination.  MSK:  Non tender with full range of motion and good stability and symmetric strength and tone of elbows, wrist, hip, knee and ankles bilaterally.  Shoulder: Right Inspection reveals no abnormalities, atrophy or asymmetry. Palpation is normal with no tenderness over AC joint or bicipital groove. ROM is full in all planes passively patient does have crepitus but mild. Rotator cuff strength normal throughout. signs of impingement with positive Neer and Hawkin's tests, but negative empty can sign. Speeds and Yergason's tests normal. No labral pathology noted with negative Obrien's, negative clunk and good stability. Normal scapular function observed. No painful arc and no drop arm sign. No apprehension sign Contralateral shoulder has mild crepitus but no pain.  MSK US performed of: Right This study was ordered, performed, and interpreted by Charlann Boxer D.O.  Shoulder:   Supraspinatus:  Appears normal on long and transverse views, Bursal bulge seen with shoulder abduction on impingement view. Patient does have moderate Oster arthritic changes well. Infraspinatus:  Appears normal on long and transverse views. Significant increase in Doppler flow Subscapularis:  Appears normal on long and transverse views. Positive bursa Teres Minor:  Appears normal on long and transverse views. AC joint:  Capsule undistended, no geyser sign. Glenohumeral Joint:  Moderate osteoarthritic changes. Glenoid Labrum:  Intact without visualized tears. Biceps Tendon:  Appears normal on long and transverse views, no fraying of tendon, tendon located in intertubercular groove, no subluxation with shoulder internal or external rotation.  Impression: Subacromial bursitis with underlying osteophytic  changes  Procedure: Real-time Ultrasound Guided Injection of right glenohumeral  joint Device: GE Logiq E  Ultrasound guided injection is preferred based studies that show increased duration, increased effect, greater accuracy, decreased procedural pain, increased response rate with ultrasound guided versus blind injection.  Verbal informed consent obtained.  Time-out conducted.  Noted no overlying erythema, induration, or other signs of local infection.  Skin prepped in a sterile fashion.  Local anesthesia: Topical Ethyl chloride.  With sterile technique and under real time ultrasound guidance:  Joint visualized.  23g 1  inch needle inserted posterior approach. Pictures taken for needle placement. Patient did have injection of 2 cc of 1% lidocaine, 2 cc of 0.5% Marcaine, and 1.0 cc of Kenalog 40 mg/dL. Completed without difficulty  Pain immediately resolved suggesting accurate placement of the medication.  Advised to call if fevers/chills, erythema, induration, drainage, or persistent bleeding.  Images permanently stored and available for review in the ultrasound unit.  Impression: Technically successful ultrasound guided injection.    Impression and Recommendations:     This case required medical decision making of moderate complexity.

## 2013-08-30 ENCOUNTER — Ambulatory Visit
Admission: RE | Admit: 2013-08-30 | Discharge: 2013-08-30 | Disposition: A | Discharge status: Home / Self Care | Payer: BC Managed Care – PPO | Source: Ambulatory Visit | Attending: Internal Medicine | Admitting: Internal Medicine

## 2013-08-30 DIAGNOSIS — R922 Inconclusive mammogram: Secondary | ICD-10-CM

## 2013-09-19 ENCOUNTER — Ambulatory Visit: Payer: BC Managed Care – PPO | Admitting: Family Medicine

## 2013-09-23 ENCOUNTER — Other Ambulatory Visit: Payer: Self-pay | Admitting: Gynecologic Oncology

## 2013-09-23 ENCOUNTER — Ambulatory Visit: Payer: BC Managed Care – PPO | Attending: Gynecologic Oncology | Admitting: Gynecologic Oncology

## 2013-09-23 ENCOUNTER — Encounter: Payer: Self-pay | Admitting: Gynecologic Oncology

## 2013-09-23 VITALS — BP 117/75 | HR 94 | Temp 97.9°F | Resp 18 | Ht 64.0 in | Wt 225.6 lb

## 2013-09-23 DIAGNOSIS — C549 Malignant neoplasm of corpus uteri, unspecified: Secondary | ICD-10-CM

## 2013-09-23 DIAGNOSIS — C541 Malignant neoplasm of endometrium: Secondary | ICD-10-CM | POA: Insufficient documentation

## 2013-09-23 DIAGNOSIS — N9489 Other specified conditions associated with female genital organs and menstrual cycle: Secondary | ICD-10-CM

## 2013-09-23 NOTE — Progress Notes (Signed)
Consult Note: Gyn-Onc  Consult was requested by Dr. Pamala Hurry for the evaluation of Gardiner Ramus 63 y.o. female  CC:  Chief Complaint  Patient presents with  . 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    Assessment/Plan:  Ms. CHEYLA DUCHEMIN  is a 63 y.o.  year old woman who is seen in consultation at the request of Dr Pamala Hurry for clinical stage IIIC serous endometrial cancer. I discussed her diagnosis with her and her niece. I discussed that we would need to obtain additional information (including a CT of the chest, abdomen and pelvis) to determine if she has clinical evidence of unresectable stage IV disease. If this is the case, I would recommend that she primarily receive chemotherapy, with a plan for surgical intervention at a later date if she demonstrated good distant response.  However, if the imaging reveals disease confined to the abdomen and pelvis and it is felt to be resectable, I would recommend primary debulking with laparotomy, total hysterectomy, BSO and omentectomy with possible bowel resection. We discussed the nature of laparotomy and hysterectomy and risks including bleeding, infection, damage to adjacent structures, bowel resection and complications (leak, colostomy). We will proceed with scheduling the surgery pending CT results. I will call the patient with these results and let her know if they would change our plan for surgery. I discussed that regardless of surgical intervention up front, she will require additional adjuvant therapy (chemotherapy +/- radiation) after surgery due to the extensive nature of her disease at presentation.   HPI: Ms Errickson first noted an episode of severe sharp lower abdominal pain in December 2014 which was postprandial and resolved after 12 hours with bowel rest. She has had persistent right lower quadrant, flank pain and abdominal bloating since.  Symptoms are worse with eating. She denies nausea or emesis. She does report constipation. She does not use analgesia for the pain. She denies vaginal bleeding or discharge. She is postmenopausal with an LMP at age 56.  She underwent a routine gyn care in early June, 2015, after many years of no care. At that time a pap smear revealed AGUS results. This prompted an office examination and biopsy of the endocervix and endometrium on 09/12/13(both of which revealed serous endometrial cancer) and an office [place] which determined the pelvic mass (14.2 x 12 x 13.4cm) in the posterior cul de sac. The uterus was 7x5x2.3cm.   CA 125 drawn on 09/16/13: 964 (elevated)  Interval History: Ms Covell was scheduled for consultation with Gyn Onc after her biopsy results returned. Her symptoms have remained stable since her exam.  Current Meds:  Outpatient Encounter Prescriptions as of 09/23/2013  Medication Sig  . acetaminophen (TYLENOL) 500 MG tablet Take 500 mg by mouth as needed.  Marland Kitchen aspirin EC 81 MG tablet Take 1 tablet (81 mg total) by mouth daily.  Marland Kitchen atorvastatin (LIPITOR) 10 MG tablet Take 1 tablet (10 mg total) by mouth daily.  Marland Kitchen lisinopril-hydrochlorothiazide (PRINZIDE,ZESTORETIC) 20-25 MG per tablet TAKE 1 TABLET BY MOUTH DAILY.  . Multiple Vitamin (MULTIVITAMIN) tablet Take 1 tablet by mouth daily.  . ranitidine (ZANTAC) 150 MG tablet Take 150 mg by mouth 2 (two) times daily.  . Vitamin D, Cholecalciferol, 400 UNITS CHEW Chew by mouth.    Allergy: No Known Allergies  Social Hx:   History   Social History  . Marital Status: Divorced    Spouse Name: N/A  Number of Children: N/A  . Years of Education: N/A   Occupational History  . Housekeeper [employer]   Social History Main Topics  . Smoking status: Former Research scientist (life sciences)  . Smokeless tobacco: Not on file  . Alcohol Use: 0.0 oz/week    0 Glasses of wine per week  . Drug Use: No  . Sexual Activity: Not on file   Other Topics Concern  . Not on file    Social History Narrative   Regular exercise-yes   Caffeine use-no             Past Surgical Hx:  Past Surgical History  Procedure Laterality Date  . Replacement total knee Left     Past Medical Hx:  Past Medical History  Diagnosis Date  . Hypertension   . Other and unspecified hyperlipidemia 05/28/2012  . Hyperlipidemia     Past Gynecological History: LMP age 72, postmenopausal. Hx of SVD x 1  Family Hx:  Family History  Problem Relation Age of Onset  . Hypertension Mother   . Cancer Mother 22    colon cancer  . Hypertension Father   . Heart disease Father   . Cancer Sister 71    Breast Cancer  . Alcohol abuse Brother   . Cancer Brother 60    Throat Cancer,lung cancer, smoker    Review of Systems:  Constitutional  Feels well  ENT Normal appearing ears and nares bilaterally Skin/Breast  No rash, sores, jaundice, itching, dryness Cardiovascular  No chest pain, shortness of breath, or edema  Pulmonary  No cough or wheeze.  Gastro Intestinal  No nausea, vomitting, or diarrhoea. No bright red blood per rectum, + abdominal pain, + change in bowel movement, + constipation, + bloating.  Genito Urinary  No frequency, urgency, dysuria, discharge or vaginal bleeding Musculo Skeletal  No myalgia, arthralgia, joint swelling or pain  Neurologic  No weakness, numbness, change in gait,  Psychology  No depression, anxiety, insomnia.   Vitals:  Blood pressure 117/75, pulse 94, temperature 97.9 F (36.6 C), temperature source Oral, resp. rate 18, height 5\' 4"  (1.626 m), weight 225 lb 9.6 oz (102.331 kg).  Physical Exam: WD in NAD Neck  Supple NROM, without any enlargements.  Lymph Node Survey No cervical supraclavicular or inguinal adenopathy Cardiovascular  Pulse normal rate, regularity and rhythm. S1 and S2 normal.  Lungs  Clear to auscultation bilateraly, without wheezes/crackles/rhonchi. Good air movement.  Skin  No rash/lesions/breakdown  Psychiatry   Alert and oriented to person, place, and time  Abdomen  Obese, no prior incisional scars. Normoactive bowel sounds, abdomen non-tender, without evidence of hernia. There is a large firm, minimally mobile mass filling her lower abdomen and pelvis and extends to the level of the umbilicus. Back No CVA tenderness Genito Urinary  Vulva/vagina: Normal external female genitalia.  No lesions. No discharge or bleeding.  Bladder/urethra:  No lesions or masses, well supported bladder  Vagina: normal appearing, some mild relaxation, normal rugation  Cervix: Normal appearing, no lesions, appears smooth, but is slightly firm on palpation.  Uterus: indistinguishable from the large pelvic mass which is minimally mobile (but does not feel fixed to the sidewall) and extends to umbilicus.  No discrete parametrial involvement or nodularity.  Adnexa: Large pelvic mass filling posterior pelvis and extending to umbilicus. Rectal  Good tone, no masses no cul de sac nodularity.  Extremities  No bilateral cyanosis, clubbing or edema.   @[facility] @ 09/23/2013, 11:30 AM

## 2013-09-23 NOTE — Patient Instructions (Signed)
You are scheduled for surgery on September 08, 2013 with Dr. Denman George.  We will call you with your CT scan results.                Hysterectomy Information  A hysterectomy is a surgery in which your uterus is removed. This surgery may be done to treat various medical problems. After the surgery, you will no longer have menstrual periods. The surgery will also make you unable to become pregnant (sterile). The fallopian tubes and ovaries can be removed (bilateral salpingo-oophorectomy) during this surgery as well.  REASONS FOR A HYSTERECTOMY  Persistent, abnormal bleeding.  Lasting (chronic) pelvic pain or infection.  The lining of the uterus (endometrium) starts growing outside the uterus (endometriosis).  The endometrium starts growing in the muscle of the uterus (adenomyosis).  The uterus falls down into the vagina (pelvic organ prolapse).  Noncancerous growths in the uterus (uterine fibroids) that cause symptoms.  Precancerous cells.  Cervical cancer or uterine cancer. TYPES OF HYSTERECTOMIES  Supracervical hysterectomy--In this type, the top part of the uterus is removed, but not the cervix.  Total hysterectomy--The uterus and cervix are removed.  Radical hysterectomy--The uterus, the cervix, and the fibrous tissue that holds the uterus in place in the pelvis (parametrium) are removed. WAYS A HYSTERECTOMY CAN BE PERFORMED  Abdominal hysterectomy--A large surgical cut (incision) is made in the abdomen. The uterus is removed through this incision.  Vaginal hysterectomy--An incision is made in the vagina. The uterus is removed through this incision. There are no abdominal incisions.  Conventional laparoscopic hysterectomy--Three or four small incisions are made in the abdomen. A thin, lighted tube with a camera (laparoscope) is inserted into one of the incisions. Other tools are put through the other incisions. The uterus is cut into small pieces. The small pieces are removed through the  incisions, or they are removed through the vagina.  Laparoscopically assisted vaginal hysterectomy (LAVH)--Three or four small incisions are made in the abdomen. Part of the surgery is performed laparoscopically and part vaginally. The uterus is removed through the vagina.  Robot-assisted laparoscopic hysterectomy--A laparoscope and other tools are inserted into 3 or 4 small incisions in the abdomen. A computer-controlled device is used to give the surgeon a 3D image and to help control the surgical instruments. This allows for more precise movements of surgical instruments. The uterus is cut into small pieces and removed through the incisions or removed through the vagina. RISKS AND COMPLICATIONS  Possible complications associated with this procedure include:  Bleeding and risk of blood transfusion. Tell your health care provider if you do not want to receive any blood products.  Blood clots in the legs or lung.  Infection.  Injury to surrounding organs.  Problems or side effects related to anesthesia.  Conversion to an abdominal hysterectomy from one of the other techniques. WHAT TO EXPECT AFTER A HYSTERECTOMY  You will be given pain medicine.  You will need to have someone with you for the first 3-5 days after you go home.  You will need to follow up with your surgeon in 2-4 weeks after surgery to evaluate your progress.  You may have early menopause symptoms such as hot flashes, night sweats, and insomnia.  If you had a hysterectomy for a problem that was not cancer or not a condition that could lead to cancer, then you no longer need Pap tests. However, even if you no longer need a Pap test, a regular exam is a good idea to  make sure no other problems are starting. Document Released: 09/14/2000 Document Revised: 01/09/2013 Document Reviewed: 11/26/2012 [facility] Patient Information 2015 [place], [place]. This information is not intended to replace advice given to you by your health  care provider. Make sure you discuss any questions you have with your health care provider.                                                                                             Preparing for your Surgery  Pre-operative Testing -You will receive a phone call from presurgical testing at [facility] to arrange for a pre-operative testing appointment before your surgery.  This appointment normally occurs one to two weeks before your scheduled surgery.   -Bring your insurance card, copy of an advanced directive if applicable, medication list  -At that visit, you will be asked to sign a consent for a possible blood transfusion in case a transfusion becomes necessary during surgery.  The need for a blood transfusion is rare but having consent is a necessary part of your care.     Day Before Surgery at [place] will be asked to take in only clear liquids the day before surgery.  Examples of clear liquids include broths, jello, and clear juices.  You may also be advised to perform a Miralax bowel prep the night before your surgery based off of your provider's recommendations.  You will be advised to have nothing to eat or drink after midnight the evening before.    Your role in recovery Your role is to become active as soon as directed by your doctor, while still giving yourself time to heal.  Rest when you feel tired. You will be asked to do the following in order to speed your recovery:  - Cough and breathe deeply. This helps toclear and expand your lungs and can prevent pneumonia. You may be given a spirometer to practice deep breathing. A staff member will show you how to use the spirometer. - Do mild physical activity. Walking or moving your legs help your circulation and body functions return to normal. A staff member will help you when you try to walk and will provide you with simple exercises. Do not try to get up or walk alone the first time. - Actively manage your pain.  Managing your pain lets you move in comfort. We will ask you to rate your pain on a scale of zero to 10. It is your responsibility to tell your doctor or nurse where and how much you hurt so your pain can be treated.  Special Considerations -If you are diabetic, you may be placed on insulin after surgery to have closer control over your blood sugars to promote healing and recovery.  This does not mean that you will be discharged on insulin.  If applicable, your oral antidiabetics will be resumed when you are tolerating a solid diet.  -Your final pathology results from surgery should be available by the Friday after surgery and the results will be relayed to you when available.

## 2013-09-25 ENCOUNTER — Telehealth: Payer: Self-pay | Admitting: *Deleted

## 2013-09-25 NOTE — Telephone Encounter (Addendum)
Pt's son called with concerns regarding his mothers recent visit. Informed pt's son at the request of pt she has stage III cancer, she will have a hysterectomy on July 7 with Dr. Denman George.  Melissa Ball voiced concerns regarding hysterectomy surgery worsening her cancer and what the purpose of CT scan is. Informed Melissa Ball I would pass his concerns on to Dr. Denman George.    Returned phone call but left message to call me back. I would be happy to discuss the indications for surgery and the need for Ct imaging. Melissa Eva, MD

## 2013-09-26 ENCOUNTER — Ambulatory Visit (HOSPITAL_COMMUNITY)
Admission: RE | Admit: 2013-09-26 | Discharge: 2013-09-26 | Disposition: A | Discharge status: Home / Self Care | Payer: BC Managed Care – PPO | Source: Ambulatory Visit | Attending: Gynecologic Oncology | Admitting: Gynecologic Oncology

## 2013-09-26 ENCOUNTER — Encounter (HOSPITAL_COMMUNITY): Payer: Self-pay

## 2013-09-26 DIAGNOSIS — N9489 Other specified conditions associated with female genital organs and menstrual cycle: Secondary | ICD-10-CM

## 2013-09-26 DIAGNOSIS — C549 Malignant neoplasm of corpus uteri, unspecified: Secondary | ICD-10-CM | POA: Insufficient documentation

## 2013-09-26 DIAGNOSIS — J984 Other disorders of lung: Secondary | ICD-10-CM | POA: Insufficient documentation

## 2013-09-26 DIAGNOSIS — C541 Malignant neoplasm of endometrium: Secondary | ICD-10-CM

## 2013-09-26 DIAGNOSIS — R188 Other ascites: Secondary | ICD-10-CM | POA: Insufficient documentation

## 2013-09-26 DIAGNOSIS — C569 Malignant neoplasm of unspecified ovary: Secondary | ICD-10-CM | POA: Insufficient documentation

## 2013-09-26 DIAGNOSIS — K449 Diaphragmatic hernia without obstruction or gangrene: Secondary | ICD-10-CM | POA: Insufficient documentation

## 2013-09-26 DIAGNOSIS — J90 Pleural effusion, not elsewhere classified: Secondary | ICD-10-CM | POA: Insufficient documentation

## 2013-09-26 DIAGNOSIS — R935 Abnormal findings on diagnostic imaging of other abdominal regions, including retroperitoneum: Secondary | ICD-10-CM | POA: Insufficient documentation

## 2013-09-26 DIAGNOSIS — R599 Enlarged lymph nodes, unspecified: Secondary | ICD-10-CM | POA: Insufficient documentation

## 2013-09-26 MED ORDER — IOHEXOL 300 MG/ML  SOLN
100.0000 mL | Freq: Once | INTRAMUSCULAR | Status: AC | PRN
  Administered 2013-09-26: 100 mL via INTRAVENOUS

## 2013-09-27 ENCOUNTER — Other Ambulatory Visit: Payer: Self-pay | Admitting: *Deleted

## 2013-09-27 ENCOUNTER — Telehealth: Payer: Self-pay | Admitting: Gynecologic Oncology

## 2013-09-27 DIAGNOSIS — C549 Malignant neoplasm of corpus uteri, unspecified: Secondary | ICD-10-CM

## 2013-09-27 NOTE — Progress Notes (Unsigned)
Referral to

## 2013-09-27 NOTE — Telephone Encounter (Signed)
Informed Melissa Ball that her CT showed stage IV serous endometrial cancer (with disease in the upper abdomen including extensive bulky retroperitoneal adenopathy and disease on the surface of the liver). I discussed that I felt the best approach for her given the extensiveness of disease would be to do neoadjuvant chemotherapy with 3-4 cycles of carboplatin and paclitaxel q 21 days, followed by repeat imaging with a CT chest, abdomen and pelvis, followed by possible interval surgery if she demonstrates a good response.  I discussed that we will cancel her OR day for 10/08/13 for now, and will instead arrange for an appointment ASAP with Dr Bing Ree for consideration of chemotherapy.  She asked that I speak to her son, Barron Schmid at  [phone], which I did and relayed this info.  Donaciano Eva, MD

## 2013-09-27 NOTE — Telephone Encounter (Signed)
Called patient and left message to call me back re CT scan.  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 We will cancel our plan for surgery on 7/7, and instead move to neoadjuvant chemo with a plan for surgical exploration after 3-4 cycles if she shows good response.  Donaciano Eva, MD

## 2013-09-30 ENCOUNTER — Telehealth: Payer: Self-pay | Admitting: Oncology

## 2013-09-30 NOTE — Telephone Encounter (Signed)
S/W PATIENT AND GAVE NP APPT FOR 07/02 @ 3 W/DR. LIVESAY, CHEMO EDU CLASS 07/01 @ 10 ALL APPT CONFIRMED.

## 2013-10-02 ENCOUNTER — Encounter: Payer: Self-pay | Admitting: *Deleted

## 2013-10-02 ENCOUNTER — Other Ambulatory Visit: Payer: BC Managed Care – PPO

## 2013-10-02 ENCOUNTER — Other Ambulatory Visit: Payer: Self-pay | Admitting: Oncology

## 2013-10-02 DIAGNOSIS — C541 Malignant neoplasm of endometrium: Secondary | ICD-10-CM

## 2013-10-03 ENCOUNTER — Encounter: Payer: Self-pay | Admitting: Oncology

## 2013-10-03 ENCOUNTER — Other Ambulatory Visit (HOSPITAL_BASED_OUTPATIENT_CLINIC_OR_DEPARTMENT_OTHER): Payer: BC Managed Care – PPO

## 2013-10-03 ENCOUNTER — Other Ambulatory Visit: Payer: Self-pay | Admitting: Oncology

## 2013-10-03 ENCOUNTER — Ambulatory Visit (HOSPITAL_BASED_OUTPATIENT_CLINIC_OR_DEPARTMENT_OTHER): Payer: BC Managed Care – PPO | Admitting: Oncology

## 2013-10-03 ENCOUNTER — Ambulatory Visit (HOSPITAL_BASED_OUTPATIENT_CLINIC_OR_DEPARTMENT_OTHER): Payer: BC Managed Care – PPO

## 2013-10-03 VITALS — BP 113/76 | HR 101 | Temp 98.2°F | Resp 18 | Ht 64.0 in | Wt 227.1 lb

## 2013-10-03 DIAGNOSIS — C541 Malignant neoplasm of endometrium: Secondary | ICD-10-CM

## 2013-10-03 DIAGNOSIS — C549 Malignant neoplasm of corpus uteri, unspecified: Secondary | ICD-10-CM

## 2013-10-03 DIAGNOSIS — I10 Essential (primary) hypertension: Secondary | ICD-10-CM

## 2013-10-03 DIAGNOSIS — E669 Obesity, unspecified: Secondary | ICD-10-CM

## 2013-10-03 DIAGNOSIS — F17200 Nicotine dependence, unspecified, uncomplicated: Secondary | ICD-10-CM

## 2013-10-03 LAB — COMPREHENSIVE METABOLIC PANEL (CC13)
ALBUMIN: 3.1 g/dL — AB (ref 3.5–5.0)
ALK PHOS: 91 U/L (ref 40–150)
ALT: 31 U/L (ref 0–55)
AST: 34 U/L (ref 5–34)
Anion Gap: 11 mEq/L (ref 3–11)
BUN: 17 mg/dL (ref 7.0–26.0)
CALCIUM: 9.9 mg/dL (ref 8.4–10.4)
CHLORIDE: 104 meq/L (ref 98–109)
CO2: 24 mEq/L (ref 22–29)
Creatinine: 1.1 mg/dL (ref 0.6–1.1)
Glucose: 88 mg/dl (ref 70–140)
POTASSIUM: 4.2 meq/L (ref 3.5–5.1)
SODIUM: 139 meq/L (ref 136–145)
TOTAL PROTEIN: 7.7 g/dL (ref 6.4–8.3)
Total Bilirubin: 0.2 mg/dL (ref 0.20–1.20)

## 2013-10-03 LAB — CBC WITH DIFFERENTIAL/PLATELET
BASO%: 0.5 % (ref 0.0–2.0)
BASOS ABS: 0.1 10*3/uL (ref 0.0–0.1)
EOS ABS: 0.1 10*3/uL (ref 0.0–0.5)
EOS%: 0.5 % (ref 0.0–7.0)
HCT: 32.6 % — ABNORMAL LOW (ref 34.8–46.6)
HGB: 10.1 g/dL — ABNORMAL LOW (ref 11.6–15.9)
LYMPH#: 1.9 10*3/uL (ref 0.9–3.3)
LYMPH%: 16.5 % (ref 14.0–49.7)
MCH: 23.8 pg — AB (ref 25.1–34.0)
MCHC: 31.1 g/dL — ABNORMAL LOW (ref 31.5–36.0)
MCV: 76.4 fL — AB (ref 79.5–101.0)
MONO#: 1 10*3/uL — ABNORMAL HIGH (ref 0.1–0.9)
MONO%: 8.8 % (ref 0.0–14.0)
NEUT%: 73.7 % (ref 38.4–76.8)
NEUTROS ABS: 8.4 10*3/uL — AB (ref 1.5–6.5)
Platelets: 393 10*3/uL (ref 145–400)
RBC: 4.27 10*6/uL (ref 3.70–5.45)
RDW: 16 % — AB (ref 11.2–14.5)
WBC: 11.4 10*3/uL — ABNORMAL HIGH (ref 3.9–10.3)

## 2013-10-03 NOTE — Progress Notes (Signed)
Checked in new patient with no financial issues prior to seeing dr. She has appt card.

## 2013-10-03 NOTE — Progress Notes (Signed)
[place] NEW PATIENT EVALUATION   Name: Melissa Ball Date: 10/03/2013 MRN: [id number] DOB: April 30, 1950  REFERRING PHYSICIAN: Everitt Ball cc Melissa Borg, MD , [place]   REASON FOR REFERRAL: new diagnosis advanced serous gyn carcinoma, for consideration of chemotherapy   HISTORY OF PRESENT ILLNESS:Melissa Ball is a 63 y.o. female who is seen in consultation, together with her son and neice, at the request of Melissa Melissa Ball for consideration of chemotherapy as initial intervention for metastatic gyn carcinoma. Information from this EMR and communication from Melissa Ball, outside information from [employer], and patient/ family now.  Patient had been in usual good health until acute episode of abdominal pain in Dec 2014, which eventually resolved without medical attention. More recently she has had vague diffuse abdominal discomfort, low grade nausea and abdominal bloating. She was seen in June 2015 for first gyn exam in years, PAP with AGUS and follow up biopsies of endocervix and endometrium by Melissa Melissa Ball both with serous endometrial cancer (pathology from [facility] 09-12-13, case # [id number] to be scanned into this EMR). Biopsy of cervix had normal squamous epithelium. CA125 from [facility] 09-16-13 was 964. She was seen by Melissa Ball on 09-23-13, her exam remarkable for large pelvic mass minimally mobile and extending to umbilicus. She had CT CAP 09-26-13 had prominent but not enlarged juxtapericardiac lymph nodes, trace right pleural effusion, large amount of abnormal soft tissue thruout peritoneal cavity, predominately with omentum and especially LUQ, largest area 3.8 x 3.0 x 4.0 cm anterior to proximal descending colon, implants adjacent to liver, large complex multicystic lesion in pelvis inseperable from uterus and adnexae, with mass effect on bladder, questionable area in central aspect of dome of liver, borderline enlarged retroperitoneal nodes. Due to  extent of disease on CT, Melissa Ball has recommended that treatment begin with chemotherapy, with consideration of interval debulking surgery depending on response to systemic treatment. Patient attended chemotherapy education class prior to MD visit today.  Per patient, peripheral IV access has not been difficult  REVIEW OF SYSTEMS as above, also: Vague burning sensation thru abdomen, tho more classic GERD symptoms better now on zantac 150 mg bid. Some constipation for which she uses occasional magnesium citrate. No bleeding. No bladder symptoms. No LE swelling. Weight down slightly in past several weeks, appetite not as good as usual. Slight nausea intermittently without vomiting. No HA or other neurologic symptoms. Uses reading glasses. No sinus symptoms, no difficulty hearing, no thyroid symptoms. Has dentures. No SOB or other respiratory symptoms including no asthma. Occasional arthritis tho left knee great since replacement. No blood clots. No peripheral neuropathy symptoms. No noted changes in breast self exam.  Remainder of full 10 point review of systems negative.   ALLERGIES: Review of patient's allergies indicates no known allergies.  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 Divorced, lives alone in [place]., niece local and son in [place]. 3 grands. 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 A few cigarettes weekly x years, ongoing. No tobaccol  Multiple siblings and other family locally. Son Melissa Ball [phone]  FAMILY HISTORY:  M HTN, colon ca  age 59 F cirrhosis S breast ca age 27 Brother throat and lung Ca  (smoked + ETOH) Total 6 siblings          PHYSICAL EXAM:  height is 5\' 4"  (1.626 m) and weight is 227 lb 1.6 oz (103.012 kg). Her oral temperature is 98.2 F (36.8 C). Her blood pressure is 113/76 and her pulse is 101. Her respiration is 18.  BMI 39.1 Alert, pleasant, cooperative lady, looks stated age, good historian,NAD.  Son and niece very supportive, present for all of discussion  HEENT: normal hair pattern. PERRL, not icteric. Dentures. Oral mucosa moist and clear. Neck supple without JVD or thyroid mass.   RESPIRATORY: lungs clear to A and P anteriorly and posteriorly. Respirations not labored RA  CARDIAC/ VASCULAR: heart RRR, no gallop, no murmur appreciated. Peripheral pulses intact and symmetrical  ABDOMEN: obese, soft, not discretely tender, not obviously distended. Bowel sounds present and normally active. No HSM or mass appreciated.  LYMPH NODES: No cervical, supraclavicular, axillary or inguinal adenopathy  BREASTS: bilaterally without dominant mass, skin or nipple findings  NEUROLOGIC: speech fluent and appropriate, CN intact. Motor, sensory, cerebellar without focal deficits.  SKIN: Without rash, ecchymosis, petechiae  MUSCULOSKELETAL:back nontender. Extremities without pitting edema, cords, tenderness, clubbing or cyanosis. Muscle mass symmetrical and good.    LABORATORY DATA:  Results for orders placed in visit on 10/03/13 (from the past 48 hour(s))  CBC WITH DIFFERENTIAL     Status: Abnormal   Collection Time    10/03/13  3:36 PM      Result Value Ref Range   WBC 11.4 (*) 3.9 - 10.3 10e3/uL   NEUT# 8.4 (*) 1.5 - 6.5 10e3/uL   HGB 10.1 (*) 11.6 - 15.9 g/dL   HCT 32.6 (*) 34.8 - 46.6 %   Platelets 393  145 - 400 10e3/uL   MCV 76.4 (*) 79.5 - 101.0 fL   MCH 23.8 (*) 25.1 - 34.0 pg   MCHC 31.1 (*) 31.5 - 36.0 g/dL   RBC 4.27  3.70 - 5.45 10e6/uL   RDW 16.0 (*) 11.2 - 14.5 %   lymph# 1.9  0.9 - 3.3 10e3/uL   MONO# 1.0 (*) 0.1 - 0.9 10e3/uL    Eosinophils Absolute 0.1  0.0 - 0.5 10e3/uL   Basophils Absolute 0.1  0.0 - 0.1 10e3/uL   NEUT% 73.7  38.4 - 76.8 %   LYMPH% 16.5  14.0 - 49.7 %   MONO% 8.8  0.0 - 14.0 %   EOS% 0.5  0.0 - 7.0 %   BASO% 0.5  0.0 - 2.0 %  COMPREHENSIVE METABOLIC PANEL (GU54)     Status: Abnormal   Collection Time    10/03/13  3:36 PM      Result Value Ref Range   Sodium 139  136 - 145 mEq/L   Potassium 4.2  3.5 - 5.1 mEq/L   Chloride 104  98 - 109 mEq/L   CO2 24  22 - 29 mEq/L   Glucose 88  70 - 140 mg/dl   BUN 17.0  7.0 - 26.0 mg/dL   Creatinine 1.1  0.6 - 1.1 mg/dL   Total Bilirubin 0.20  0.20 - 1.20 mg/dL   Alkaline Phosphatase 91  40 - 150 U/L   AST 34  5 - 34 U/L   ALT 31  0 - 55 U/L   Total Protein 7.7  6.4 - 8.3 g/dL   Albumin 3.1 (*) 3.5 - 5.0 g/dL   Calcium  9.9  8.4 - 10.4 mg/dL   Anion Gap 11  3 - 11 mEq/L   WBC slightly elevated in similar range back at least to 05-2010.   CA 125 available after visit 1283.7, this having been 964 on 09-16-13.  PATHOLOGY: CT CHEST, ABDOMEN, AND PELVIS WITH CONTRAST 09-26-13   COMPARISON: No priors.  FINDINGS:  CT CHEST FINDINGS  Mediastinum: Heart size is normal. There is no significant  pericardial fluid, thickening or pericardial calcification.  Prominent but nonenlarged juxtapericardiac lymph nodes are noted.  These are unusual, and are not typically seen, however, these are  nonspecific given their small size. No pathologically enlarged  mediastinal or hilar lymph nodes are noted. Moderate size hiatal  hernia.  Lungs/Pleura: Trace right pleural effusion. There are linear  opacities in areas of architectural distortion throughout the lung  bases bilaterally, favored to represent areas of chronic scarring or  subsegmental atelectasis. No definite suspicious appearing pulmonary  nodules or masses are noted.  Musculoskeletal: There are no aggressive appearing lytic or blastic  lesions noted in the visualized portions of the skeleton.  CT  ABDOMEN AND PELVIS FINDINGS  Abdomen/Pelvis: There is a large amount of abnormal soft tissues  scattered throughout the peritoneal cavity, predominantly in  association with the omentum, particularly in the left upper  quadrant where there are multiple soft tissue masses, largest of  which measures 3.8 x 3.0 x 4.0 cm (image 59 of series 2) immediately  anterior to the proximal descending colon. Small volume of ascites  is presumably malignant. Multiple soft tissue implants are noted  adjacent to the liver, best demonstrated on image 60 of series 2.  There is a large complex multilocular cystic appearing lesion in the  anatomic pelvis. This is inseparable from the uterus and the adnexal  structures. The largest cystic component of this lesion measures up  to 11.9 x 11.6 x 10.4 cm and has a markedly thickened irregular  wall. It is uncertain whether or not this mass is entirely arising  from the uterus, and simply invading the adjacent adnexal  structures, or if in fact there are 2 proceses here (i.e., both the  biopsy proven endometrial malignancy, and an associated ovarian  malignancy). This set of complex cystic masses in the pelvis is  exerting mass effect upon the adjacent urinary bladder which is  anteriorly and downwardly displaced. Several images demonstrated an  intact fat plane between these lesions and the urinary bladder,  however, this cannot be confirmed on all images such that early  bladder wall invasion is difficult to exclude.  There is an ill-defined area of low attenuation in the central  aspect of the dome of the liver, predominantly within segment 8,  best appreciated on image 35 of series 2. Although this is somewhat  masslike in appearance, this is favored to represent beam hardening  artifact from the high attenuation contrast in the adjacent right  atrium. Unfortunately, this portion of the liver was not imaged on  delayed images. No other suspicious hepatic  lesion is noted. The  appearance of the gallbladder, pancreas, spleen and left kidney is  unremarkable. Trace amount of fluid or soft tissue along the lateral  surface of the upper pole of the right kidney best appreciated on  image 59 of series 2. 2.4 x 1.4 cm low-attenuation (12 HU) right  adrenal nodule is favored to represent an adenoma, although  assessment on today's contrast enhanced study is very limited. There  is also a  2.1 x 0.9 cm indeterminate left adrenal nodule. Multiple  borderline enlarged retroperitoneal lymph nodes are noted, with an  enlarged left para-aortic lymph node adjacent to the aortic  bifurcation measuring 15 mm (image 77 of series 2), suspicious for  nodal metastasis.  Musculoskeletal: There are no aggressive appearing lytic or blastic  lesions noted in the visualized portions of the skeleton.  IMPRESSION:  1. Multiple large complex cystic masses in the pelvis which are  intimately associated both of the uterus and the adnexal regions  bilaterally. It is uncertain whether or not this represents a single  primary endometrial malignancy which has invaded into the adjacent  adnexal structures, or if in fact this is to separate malignancies  (i.e., the biopsy-proven primary endometrial cancer, and an  associated ovarian malignancy). There is widespread peritoneal  spread of disease, as demonstrated by ascites, serosal implants upon  the surface of the liver, and multiple omental implants. There is  also retroperitoneal lymphadenopathy and an indeterminate left  adrenal lesion. In the chest to there is a trace right pleural  effusion, which is indeterminate, but certainly suspicious for an  additional site of spread, and there are some prominent but  nonenlarged juxtapericardiac lymph nodes.  2. Within the liver there is an ill-defined area of low attenuation  near the dome in segment 8. This is favored to be artifactual  related to high attenuation contrast  material within the right  atrium and associated beam hardening artifact, however, it is  difficult to exclude the possibility of a solitary hepatic  metastasis. Attention on followup studies is recommended.  3. Extensive areas of atelectasis and/or scarring throughout the  lung bases bilaterally.  4. Moderate-sized hiatal hernia.  5. 1.4 x 2.4 cm right adrenal nodule is very low-attenuation on  today's contrast enhanced study, strongly favored to be an adrenal  adenoma. Attention on followup studies to ensure stability is  recommended.   PATHOLOGY from [facility] as in history above, report to be scanned into this EMR     DISCUSSION: We have discussed all of history as above, circumstances surrounding presentation and results of evaluation as above. I have reviewed PACS images from CT with patient and family together, and discussed pathology. We have discussed typical spread pattern of gyn cancers, including peritoneal and omental surface involvement and lymph node involvement. We have discussed rationale for beginning treatment with chemotherapy, and have reviewed information about taxol and carboplatin used every 3 weeks. We have discussed premedication steroids for taxol, antiemetics and use of growth factors if needed. She would prefer peripheral IV access if adequate. We have discussed possible taxol allergic reactions, aches and peripheral neuropathy. Due to 4th of July holiday, I doubt chemo can be scheduled before latter part of next week; son plans to return home to [place] briefly, then to be back in [place] for first chemotherapy. They understand that we will follow clinically as chemo begins, likely repeat scans after ~ 3 cycles and stay in contact with [place] for possible interval debulking surgery. I have been very clear that, while interventions can frequently be very helpful in this situation, we do not expect long term cure.  She will need RN to review timing of  decadron premedication when chemo appointment is scheduled, and to review antiemetics.     IMPRESSION / PLAN:  1. Clinical stage IV serous endometrial carcinoma in otherwise generally healthy 63 yo lady: moderately symptomatic. Plan to begin with q 3 week taxol carboplatin as  soon as possible next week. I will see her back ~ a week after treatment with repeat labs. Expect 3-6 treatments with interval debulking surgery depending on response. 2. HTN, elevated cholesterol 3.obesity 4.post left total knee replacement for degenerative arthritis 5.minimal but ongoing tobacco: discussed 6.GERD improved with bid zantac    Patient and accompanying individuals have had questions answered to their satisfaction and are in agreement with plan above. They can contact this office for questions or concerns at any time prior to next scheduled visit.  Consent obtained. Antiemetics to be sent to pharmacy. Expect ~3-6 cycles depending on response.  Time spent  75 min , including >50% discussion and coordination of care.    Chancy Claros P, MD 10/03/2013 8:17 PM

## 2013-10-04 LAB — CA 125: CA 125: 1283.7 U/mL — ABNORMAL HIGH (ref 0.0–30.2)

## 2013-10-07 ENCOUNTER — Telehealth: Payer: Self-pay

## 2013-10-07 ENCOUNTER — Telehealth: Payer: Self-pay | Admitting: Oncology

## 2013-10-07 DIAGNOSIS — C541 Malignant neoplasm of endometrium: Secondary | ICD-10-CM

## 2013-10-07 MED ORDER — DEXAMETHASONE 4 MG PO TABS: ORAL_TABLET | ORAL | Status: DC

## 2013-10-07 MED ORDER — LORAZEPAM 1 MG PO TABS: ORAL_TABLET | ORAL | Status: DC

## 2013-10-07 MED ORDER — ONDANSETRON HCL 8 MG PO TABS: 8.0000 mg | ORAL_TABLET | Freq: Three times a day (TID) | ORAL | Status: DC | PRN

## 2013-10-07 NOTE — Telephone Encounter (Signed)
Message copied by Baruch Merl on Mon Oct 07, 2013  7:08 PM ------      Message from: Gordy Levan      Created: Sat Oct 05, 2013  3:55 PM       Prescriptions to [facility]. [facility] always fine.            Decadron 4 mg  Five tabs = 20 mg with food 12 hrs and 6 hrs prior to chemo   #10 for first cycle only            Zofran 8mg    1 every 8 hrs prn nausea. Will not make drowsy #30 2 RF            Ativan 1 mg   1/2 - 1 tablet SL or po q 6 hr prn nausea. Will make drowsy. #20            RN please speak with patient by phone when chemo is scheduled, to be sure she understands steroid times and instructions. Also tell her to take ativan night of chemo whether or not nausea and zofran am after chemo whether or not nausea.  Review possible taxol aches and recommend claritin. Be sure she knows how to contact office.      OK for her to try the antiemetics prior to chemo, as she has had some slight nausea.            Thanks      Cc LA, TH ------

## 2013-10-07 NOTE — Telephone Encounter (Signed)
s/w pt re next appt for 7/10. pt to get schedule when she comes in.

## 2013-10-07 NOTE — Telephone Encounter (Signed)
Reviewed information noted below with patient. Told Ms. Diven to use 1030 for time of treatment on 10-11-13 to count back 12 and 6 hours= 1030 pm on 10-10-13 and 0430 on 10-11-13. Ms. Hollander wrote information down for above and phone number [phone]. Ms. Glore very nervious about treatment.  Will have collaberitavie nurse on 10-10-13 call patient and review information with patient.

## 2013-10-08 ENCOUNTER — Inpatient Hospital Stay (HOSPITAL_COMMUNITY)
Admission: RE | Admit: 2013-10-08 | Payer: BC Managed Care – PPO | Source: Ambulatory Visit | Admitting: Gynecologic Oncology

## 2013-10-08 ENCOUNTER — Encounter: Payer: Self-pay | Admitting: *Deleted

## 2013-10-08 ENCOUNTER — Encounter: Payer: Self-pay | Admitting: Oncology

## 2013-10-08 ENCOUNTER — Encounter (HOSPITAL_COMMUNITY): Admission: RE | Payer: Self-pay | Source: Ambulatory Visit

## 2013-10-08 SURGERY — LAPAROTOMY, EXPLORATORY
Anesthesia: General

## 2013-10-08 NOTE — Progress Notes (Signed)
RECEIVED A FAX FROM [facility] CONCERNING A PRIOR AUTHORIZATION FOR ONDANSETRON. THIS REQUEST WAS PLACED IN THE MANAGED CARE BIN. 

## 2013-10-08 NOTE — Progress Notes (Signed)
[facility], [id number], approved ondansetron from 09/08/13-10/08/14 case # [id number]

## 2013-10-09 ENCOUNTER — Other Ambulatory Visit: Payer: Self-pay | Admitting: Oncology

## 2013-10-09 DIAGNOSIS — C541 Malignant neoplasm of endometrium: Secondary | ICD-10-CM

## 2013-10-10 ENCOUNTER — Telehealth: Payer: Self-pay | Admitting: *Deleted

## 2013-10-10 NOTE — Telephone Encounter (Signed)
Called Melissa Ball to follow up on pre meds for chemo- reinforced use as instructed below. Pt verbalized understanding  Told Melissa. Pulcini to use 1030 for time of treatment on 10-11-13 to count back 12 and 6 hours= 1030 pm on 10-10-13 and 0430 on 10-11-13.

## 2013-10-11 ENCOUNTER — Ambulatory Visit (HOSPITAL_BASED_OUTPATIENT_CLINIC_OR_DEPARTMENT_OTHER): Payer: BC Managed Care – PPO

## 2013-10-11 ENCOUNTER — Ambulatory Visit (HOSPITAL_BASED_OUTPATIENT_CLINIC_OR_DEPARTMENT_OTHER): Payer: BC Managed Care – PPO | Admitting: Lab

## 2013-10-11 VITALS — BP 118/72 | HR 94 | Temp 98.5°F | Resp 20

## 2013-10-11 DIAGNOSIS — C549 Malignant neoplasm of corpus uteri, unspecified: Secondary | ICD-10-CM

## 2013-10-11 DIAGNOSIS — C541 Malignant neoplasm of endometrium: Secondary | ICD-10-CM

## 2013-10-11 DIAGNOSIS — Z5111 Encounter for antineoplastic chemotherapy: Secondary | ICD-10-CM

## 2013-10-11 LAB — CBC WITH DIFFERENTIAL/PLATELET
BASO%: 0.1 % (ref 0.0–2.0)
Basophils Absolute: 0 10*3/uL (ref 0.0–0.1)
EOS%: 0 % (ref 0.0–7.0)
Eosinophils Absolute: 0 10*3/uL (ref 0.0–0.5)
HEMATOCRIT: 29.4 % — AB (ref 34.8–46.6)
HGB: 9.5 g/dL — ABNORMAL LOW (ref 11.6–15.9)
LYMPH%: 11 % — ABNORMAL LOW (ref 14.0–49.7)
MCH: 24.2 pg — AB (ref 25.1–34.0)
MCHC: 32.2 g/dL (ref 31.5–36.0)
MCV: 75.2 fL — ABNORMAL LOW (ref 79.5–101.0)
MONO#: 0.1 10*3/uL (ref 0.1–0.9)
MONO%: 0.7 % (ref 0.0–14.0)
NEUT#: 8.6 10*3/uL — ABNORMAL HIGH (ref 1.5–6.5)
NEUT%: 88.2 % — AB (ref 38.4–76.8)
PLATELETS: 396 10*3/uL (ref 145–400)
RBC: 3.91 10*6/uL (ref 3.70–5.45)
RDW: 16 % — ABNORMAL HIGH (ref 11.2–14.5)
WBC: 9.8 10*3/uL (ref 3.9–10.3)
lymph#: 1.1 10*3/uL (ref 0.9–3.3)

## 2013-10-11 LAB — COMPREHENSIVE METABOLIC PANEL (CC13)
ALT: 24 U/L (ref 0–55)
ANION GAP: 11 meq/L (ref 3–11)
AST: 26 U/L (ref 5–34)
Albumin: 3 g/dL — ABNORMAL LOW (ref 3.5–5.0)
Alkaline Phosphatase: 88 U/L (ref 40–150)
BUN: 20.6 mg/dL (ref 7.0–26.0)
CO2: 20 meq/L — AB (ref 22–29)
CREATININE: 1.1 mg/dL (ref 0.6–1.1)
Calcium: 9.8 mg/dL (ref 8.4–10.4)
Chloride: 102 mEq/L (ref 98–109)
Glucose: 148 mg/dl — ABNORMAL HIGH (ref 70–140)
Potassium: 4.3 mEq/L (ref 3.5–5.1)
Sodium: 133 mEq/L — ABNORMAL LOW (ref 136–145)
Total Bilirubin: 0.24 mg/dL (ref 0.20–1.20)
Total Protein: 8 g/dL (ref 6.4–8.3)

## 2013-10-11 LAB — TECHNOLOGIST REVIEW

## 2013-10-11 MED ORDER — ONDANSETRON 16 MG/50ML IVPB (CHCC)
16.0000 mg | Freq: Once | INTRAVENOUS | Status: AC
  Administered 2013-10-11: 16 mg via INTRAVENOUS

## 2013-10-11 MED ORDER — DEXAMETHASONE SODIUM PHOSPHATE 20 MG/5ML IJ SOLN
20.0000 mg | Freq: Once | INTRAMUSCULAR | Status: AC
  Administered 2013-10-11: 20 mg via INTRAVENOUS

## 2013-10-11 MED ORDER — SODIUM CHLORIDE 0.9 % IV SOLN
545.0000 mg | Freq: Once | INTRAVENOUS | Status: AC
  Administered 2013-10-11: 550 mg via INTRAVENOUS
  Filled 2013-10-11: qty 55

## 2013-10-11 MED ORDER — DIPHENHYDRAMINE HCL 50 MG/ML IJ SOLN
50.0000 mg | Freq: Once | INTRAMUSCULAR | Status: AC
  Administered 2013-10-11: 50 mg via INTRAVENOUS

## 2013-10-11 MED ORDER — ONDANSETRON 16 MG/50ML IVPB (CHCC)
INTRAVENOUS | Status: AC
  Filled 2013-10-11: qty 16

## 2013-10-11 MED ORDER — SODIUM CHLORIDE 0.9 % IV SOLN
Freq: Once | INTRAVENOUS | Status: AC
  Administered 2013-10-11: 11:00:00 via INTRAVENOUS

## 2013-10-11 MED ORDER — FAMOTIDINE IN NACL 20-0.9 MG/50ML-% IV SOLN
INTRAVENOUS | Status: AC
  Filled 2013-10-11: qty 50

## 2013-10-11 MED ORDER — DEXAMETHASONE SODIUM PHOSPHATE 20 MG/5ML IJ SOLN
INTRAMUSCULAR | Status: AC
  Filled 2013-10-11: qty 5

## 2013-10-11 MED ORDER — DIPHENHYDRAMINE HCL 50 MG/ML IJ SOLN
INTRAMUSCULAR | Status: AC
  Filled 2013-10-11: qty 1

## 2013-10-11 MED ORDER — PACLITAXEL CHEMO INJECTION 300 MG/50ML
175.0000 mg/m2 | Freq: Once | INTRAVENOUS | Status: AC
  Administered 2013-10-11: 378 mg via INTRAVENOUS
  Filled 2013-10-11: qty 63

## 2013-10-11 MED ORDER — FAMOTIDINE IN NACL 20-0.9 MG/50ML-% IV SOLN
20.0000 mg | Freq: Once | INTRAVENOUS | Status: AC
  Administered 2013-10-11: 20 mg via INTRAVENOUS

## 2013-10-11 NOTE — Patient Instructions (Signed)
[facility] Discharge Instructions for Patients Receiving Chemotherapy  Today you received the following chemotherapy agents: carboplatin, taxol  To help prevent nausea and vomiting after your treatment, we encourage you to take your nausea medication.  Take it as often as prescribed.     If you develop nausea and vomiting that is not controlled by your nausea medication, call the clinic. If it is after clinic hours your family physician or the after hours number for the clinic or go to the Emergency Department.   BELOW ARE SYMPTOMS THAT SHOULD BE REPORTED IMMEDIATELY:  *FEVER GREATER THAN 100.5 F  *CHILLS WITH OR WITHOUT FEVER  NAUSEA AND VOMITING THAT IS NOT CONTROLLED WITH YOUR NAUSEA MEDICATION  *UNUSUAL SHORTNESS OF BREATH  *UNUSUAL BRUISING OR BLEEDING  TENDERNESS IN MOUTH AND THROAT WITH OR WITHOUT PRESENCE OF ULCERS  *URINARY PROBLEMS  *BOWEL PROBLEMS  UNUSUAL RASH Items with * indicate a potential emergency and should be followed up as soon as possible.  Feel free to call the clinic you have any questions or concerns. The clinic phone number is [phone].   I have been informed and understand all the instructions given to me. I know to contact the clinic, my physician, or go to the Emergency Department if any problems should occur. I do not have any questions at this time, but understand that I may call the clinic during office hours   should I have any questions or need assistance in obtaining follow up care.    __________________________________________  _____________  __________ Signature of Patient or Authorized Representative            Date                   Time    __________________________________________ Nurse's Signature    Carboplatin injection What is this medicine? CARBOPLATIN (KAR boe pla tin) is a chemotherapy drug. It targets fast dividing cells, like cancer cells, and causes these cells to die. This medicine is used to  treat ovarian cancer and many other cancers. This medicine may be used for other purposes; ask your health care provider or pharmacist if you have questions. COMMON BRAND NAME(S): Paraplatin What should I tell my health care provider before I take this medicine? They need to know if you have any of these conditions: -blood disorders -hearing problems -kidney disease -recent or ongoing radiation therapy -an unusual or allergic reaction to carboplatin, cisplatin, other chemotherapy, other medicines, foods, dyes, or preservatives -pregnant or trying to get pregnant -breast-feeding How should I use this medicine? This drug is usually given as an infusion into a vein. It is administered in a hospital or clinic by a specially trained health care professional. Talk to your pediatrician regarding the use of this medicine in children. Special care may be needed. Overdosage: If you think you have taken too much of this medicine contact a poison control center or emergency room at once. NOTE: This medicine is only for you. Do not share this medicine with others. What if I miss a dose? It is important not to miss a dose. Call your doctor or health care professional if you are unable to keep an appointment. What may interact with this medicine? -medicines for seizures -medicines to increase blood counts like filgrastim, pegfilgrastim, sargramostim -some antibiotics like amikacin, gentamicin, neomycin, streptomycin, tobramycin -vaccines Talk to your doctor or health care professional before taking any of these medicines: -acetaminophen -aspirin -ibuprofen -ketoprofen -naproxen This list may not describe  all possible interactions. Give your health care provider a list of all the medicines, herbs, non-prescription drugs, or dietary supplements you use. Also tell them if you smoke, drink alcohol, or use illegal drugs. Some items may interact with your medicine. What should I watch for while using this  medicine? Your condition will be monitored carefully while you are receiving this medicine. You will need important blood work done while you are taking this medicine. This drug may make you feel generally unwell. This is not uncommon, as chemotherapy can affect healthy cells as well as cancer cells. Report any side effects. Continue your course of treatment even though you feel ill unless your doctor tells you to stop. In some cases, you may be given additional medicines to help with side effects. Follow all directions for their use. Call your doctor or health care professional for advice if you get a fever, chills or sore throat, or other symptoms of a cold or flu. Do not treat yourself. This drug decreases your body's ability to fight infections. Try to avoid being around people who are sick. This medicine may increase your risk to bruise or bleed. Call your doctor or health care professional if you notice any unusual bleeding. Be careful brushing and flossing your teeth or using a toothpick because you may get an infection or bleed more easily. If you have any dental work done, tell your dentist you are receiving this medicine. Avoid taking products that contain aspirin, acetaminophen, ibuprofen, naproxen, or ketoprofen unless instructed by your doctor. These medicines may hide a fever. Do not become pregnant while taking this medicine. Women should inform their doctor if they wish to become pregnant or think they might be pregnant. There is a potential for serious side effects to an unborn child. Talk to your health care professional or pharmacist for more information. Do not breast-feed an infant while taking this medicine. What side effects may I notice from receiving this medicine? Side effects that you should report to your doctor or health care professional as soon as possible: -allergic reactions like skin rash, itching or hives, swelling of the face, lips, or tongue -signs of infection -  fever or chills, cough, sore throat, pain or difficulty passing urine -signs of decreased platelets or bleeding - bruising, pinpoint red spots on the skin, black, tarry stools, nosebleeds -signs of decreased red blood cells - unusually weak or tired, fainting spells, lightheadedness -breathing problems -changes in hearing -changes in vision -chest pain -high blood pressure -low blood counts - This drug may decrease the number of white blood cells, red blood cells and platelets. You may be at increased risk for infections and bleeding. -nausea and vomiting -pain, swelling, redness or irritation at the injection site -pain, tingling, numbness in the hands or feet -problems with balance, talking, walking -trouble passing urine or change in the amount of urine Side effects that usually do not require medical attention (report to your doctor or health care professional if they continue or are bothersome): -hair loss -loss of appetite -metallic taste in the mouth or changes in taste This list may not describe all possible side effects. Call your doctor for medical advice about side effects. You may report side effects to FDA at 1-800-FDA-1088. Where should I keep my medicine? This drug is given in a hospital or clinic and will not be stored at home. NOTE: This sheet is a summary. It may not cover all possible information. If you have questions about this medicine, talk  to your doctor, pharmacist, or health care provider.  2015, Elsevier/Gold Standard. (2007-06-26 14:38:05)   Paclitaxel injection (Taxol) What is this medicine? PACLITAXEL (PAK li TAX el) is a chemotherapy drug. It targets fast dividing cells, like cancer cells, and causes these cells to die. This medicine is used to treat ovarian cancer, breast cancer, and other cancers. This medicine may be used for other purposes; ask your health care provider or pharmacist if you have questions. COMMON BRAND NAME(S): Onxol, Taxol What  should I tell my health care provider before I take this medicine? They need to know if you have any of these conditions: -blood disorders -irregular heartbeat -infection (especially a virus infection such as chickenpox, cold sores, or herpes) -liver disease -previous or ongoing radiation therapy -an unusual or allergic reaction to paclitaxel, alcohol, polyoxyethylated castor oil, other chemotherapy agents, other medicines, foods, dyes, or preservatives -pregnant or trying to get pregnant -breast-feeding How should I use this medicine? This drug is given as an infusion into a vein. It is administered in a hospital or clinic by a specially trained health care professional. Talk to your pediatrician regarding the use of this medicine in children. Special care may be needed. Overdosage: If you think you have taken too much of this medicine contact a poison control center or emergency room at once. NOTE: This medicine is only for you. Do not share this medicine with others. What if I miss a dose? It is important not to miss your dose. Call your doctor or health care professional if you are unable to keep an appointment. What may interact with this medicine? Do not take this medicine with any of the following medications: -disulfiram -metronidazole This medicine may also interact with the following medications: -cyclosporine -diazepam -ketoconazole -medicines to increase blood counts like filgrastim, pegfilgrastim, sargramostim -other chemotherapy drugs like cisplatin, doxorubicin, epirubicin, etoposide, teniposide, vincristine -quinidine -testosterone -vaccines -verapamil Talk to your doctor or health care professional before taking any of these medicines: -acetaminophen -aspirin -ibuprofen -ketoprofen -naproxen This list may not describe all possible interactions. Give your health care provider a list of all the medicines, herbs, non-prescription drugs, or dietary supplements you  use. Also tell them if you smoke, drink alcohol, or use illegal drugs. Some items may interact with your medicine. What should I watch for while using this medicine? Your condition will be monitored carefully while you are receiving this medicine. You will need important blood work done while you are taking this medicine. This drug may make you feel generally unwell. This is not uncommon, as chemotherapy can affect healthy cells as well as cancer cells. Report any side effects. Continue your course of treatment even though you feel ill unless your doctor tells you to stop. In some cases, you may be given additional medicines to help with side effects. Follow all directions for their use. Call your doctor or health care professional for advice if you get a fever, chills or sore throat, or other symptoms of a cold or flu. Do not treat yourself. This drug decreases your body's ability to fight infections. Try to avoid being around people who are sick. This medicine may increase your risk to bruise or bleed. Call your doctor or health care professional if you notice any unusual bleeding. Be careful brushing and flossing your teeth or using a toothpick because you may get an infection or bleed more easily. If you have any dental work done, tell your dentist you are receiving this medicine. Avoid taking products that  contain aspirin, acetaminophen, ibuprofen, naproxen, or ketoprofen unless instructed by your doctor. These medicines may hide a fever. Do not become pregnant while taking this medicine. Women should inform their doctor if they wish to become pregnant or think they might be pregnant. There is a potential for serious side effects to an unborn child. Talk to your health care professional or pharmacist for more information. Do not breast-feed an infant while taking this medicine. Men are advised not to father a child while receiving this medicine. What side effects may I notice from receiving this  medicine? Side effects that you should report to your doctor or health care professional as soon as possible: -allergic reactions like skin rash, itching or hives, swelling of the face, lips, or tongue -low blood counts - This drug may decrease the number of white blood cells, red blood cells and platelets. You may be at increased risk for infections and bleeding. -signs of infection - fever or chills, cough, sore throat, pain or difficulty passing urine -signs of decreased platelets or bleeding - bruising, pinpoint red spots on the skin, black, tarry stools, nosebleeds -signs of decreased red blood cells - unusually weak or tired, fainting spells, lightheadedness -breathing problems -chest pain -high or low blood pressure -mouth sores -nausea and vomiting -pain, swelling, redness or irritation at the injection site -pain, tingling, numbness in the hands or feet -slow or irregular heartbeat -swelling of the ankle, feet, hands Side effects that usually do not require medical attention (report to your doctor or health care professional if they continue or are bothersome): -bone pain -complete hair loss including hair on your head, underarms, pubic hair, eyebrows, and eyelashes -changes in the color of fingernails -diarrhea -loosening of the fingernails -loss of appetite -muscle or joint pain -red flush to skin -sweating This list may not describe all possible side effects. Call your doctor for medical advice about side effects. You may report side effects to FDA at 1-800-FDA-1088. Where should I keep my medicine? This drug is given in a hospital or clinic and will not be stored at home. NOTE: This sheet is a summary. It may not cover all possible information. If you have questions about this medicine, talk to your doctor, pharmacist, or health care provider.  2015, Elsevier/Gold Standard. (2012-05-14 16:41:21)

## 2013-10-11 NOTE — Progress Notes (Signed)
1058 - premeds zofran, dexamethasone and benadryl started.   1101 - 15 mL administered - pt began vomiting.  Infusion stopped, NS to gravity, Dr. Marko Plume desk nurse notified.  VS - see Epic.   Sharlynn Oliphant - desk RN Marko Plume - per Dr. Marko Plume restart premeds.  If continued problems with premeds - call.   1120 - infusion restarted - completed without incidence  1200 - taxol started.   1205 - pt reports tingling in upper part of body.  Infusion started.  VS taken - see Epic.  Pt reports tingling has gone.  Infusion restarted.  Completed without further incident.

## 2013-10-13 ENCOUNTER — Other Ambulatory Visit: Payer: Self-pay | Admitting: Oncology

## 2013-10-14 ENCOUNTER — Telehealth: Payer: Self-pay

## 2013-10-14 ENCOUNTER — Encounter: Payer: Self-pay | Admitting: Oncology

## 2013-10-14 ENCOUNTER — Ambulatory Visit: Payer: BC Managed Care – PPO | Admitting: Nurse Practitioner

## 2013-10-14 ENCOUNTER — Other Ambulatory Visit: Payer: Self-pay | Admitting: Oncology

## 2013-10-14 ENCOUNTER — Telehealth: Payer: Self-pay | Admitting: Oncology

## 2013-10-14 ENCOUNTER — Ambulatory Visit (HOSPITAL_BASED_OUTPATIENT_CLINIC_OR_DEPARTMENT_OTHER): Payer: BC Managed Care – PPO | Admitting: Oncology

## 2013-10-14 ENCOUNTER — Other Ambulatory Visit (HOSPITAL_BASED_OUTPATIENT_CLINIC_OR_DEPARTMENT_OTHER): Payer: BC Managed Care – PPO

## 2013-10-14 VITALS — BP 115/63 | HR 118 | Temp 98.4°F | Resp 20 | Ht 64.0 in | Wt 225.6 lb

## 2013-10-14 DIAGNOSIS — C549 Malignant neoplasm of corpus uteri, unspecified: Secondary | ICD-10-CM

## 2013-10-14 DIAGNOSIS — R112 Nausea with vomiting, unspecified: Secondary | ICD-10-CM

## 2013-10-14 DIAGNOSIS — C541 Malignant neoplasm of endometrium: Secondary | ICD-10-CM

## 2013-10-14 DIAGNOSIS — I10 Essential (primary) hypertension: Secondary | ICD-10-CM

## 2013-10-14 DIAGNOSIS — F17200 Nicotine dependence, unspecified, uncomplicated: Secondary | ICD-10-CM

## 2013-10-14 DIAGNOSIS — K920 Hematemesis: Secondary | ICD-10-CM

## 2013-10-14 DIAGNOSIS — E669 Obesity, unspecified: Secondary | ICD-10-CM

## 2013-10-14 DIAGNOSIS — K219 Gastro-esophageal reflux disease without esophagitis: Secondary | ICD-10-CM

## 2013-10-14 LAB — CBC WITH DIFFERENTIAL/PLATELET
BASO%: 0.2 % (ref 0.0–2.0)
BASOS ABS: 0 10*3/uL (ref 0.0–0.1)
EOS ABS: 0 10*3/uL (ref 0.0–0.5)
EOS%: 0 % (ref 0.0–7.0)
HCT: 28.6 % — ABNORMAL LOW (ref 34.8–46.6)
HEMOGLOBIN: 9.1 g/dL — AB (ref 11.6–15.9)
LYMPH#: 1.1 10*3/uL (ref 0.9–3.3)
LYMPH%: 10.6 % — ABNORMAL LOW (ref 14.0–49.7)
MCH: 24.1 pg — ABNORMAL LOW (ref 25.1–34.0)
MCHC: 31.8 g/dL (ref 31.5–36.0)
MCV: 75.8 fL — AB (ref 79.5–101.0)
MONO#: 0.1 10*3/uL (ref 0.1–0.9)
MONO%: 1.3 % (ref 0.0–14.0)
NEUT%: 87.9 % — AB (ref 38.4–76.8)
NEUTROS ABS: 9.5 10*3/uL — AB (ref 1.5–6.5)
Platelets: 355 10*3/uL (ref 145–400)
RBC: 3.77 10*6/uL (ref 3.70–5.45)
RDW: 15.9 % — AB (ref 11.2–14.5)
WBC: 10.8 10*3/uL — ABNORMAL HIGH (ref 3.9–10.3)

## 2013-10-14 LAB — COMPREHENSIVE METABOLIC PANEL (CC13)
ALT: 18 U/L (ref 0–55)
ANION GAP: 10 meq/L (ref 3–11)
AST: 19 U/L (ref 5–34)
Albumin: 2.7 g/dL — ABNORMAL LOW (ref 3.5–5.0)
Alkaline Phosphatase: 78 U/L (ref 40–150)
BUN: 31.4 mg/dL — ABNORMAL HIGH (ref 7.0–26.0)
CO2: 24 meq/L (ref 22–29)
CREATININE: 1.5 mg/dL — AB (ref 0.6–1.1)
Calcium: 9 mg/dL (ref 8.4–10.4)
Chloride: 95 mEq/L — ABNORMAL LOW (ref 98–109)
Glucose: 124 mg/dl (ref 70–140)
Potassium: 4.1 mEq/L (ref 3.5–5.1)
SODIUM: 129 meq/L — AB (ref 136–145)
TOTAL PROTEIN: 6.9 g/dL (ref 6.4–8.3)
Total Bilirubin: 0.71 mg/dL (ref 0.20–1.20)

## 2013-10-14 MED ORDER — HYDROCODONE-ACETAMINOPHEN 5-325 MG PO TABS: 1.0000 | ORAL_TABLET | Freq: Four times a day (QID) | ORAL | Status: DC | PRN

## 2013-10-14 MED ORDER — DEXAMETHASONE SODIUM PHOSPHATE 10 MG/ML IJ SOLN
INTRAMUSCULAR | Status: AC
  Filled 2013-10-14: qty 1

## 2013-10-14 MED ORDER — SODIUM CHLORIDE 0.9 % IV SOLN
INTRAVENOUS | Status: DC
  Administered 2013-10-14: 12:00:00 via INTRAVENOUS

## 2013-10-14 MED ORDER — ONDANSETRON 16 MG/50ML IVPB (CHCC)
16.0000 mg | Freq: Once | INTRAVENOUS | Status: AC
  Administered 2013-10-14: 16 mg via INTRAVENOUS

## 2013-10-14 MED ORDER — LORAZEPAM 2 MG/ML IJ SOLN
0.5000 mg | Freq: Once | INTRAMUSCULAR | Status: AC | PRN
  Administered 2013-10-14: 0.5 mg via INTRAVENOUS

## 2013-10-14 MED ORDER — PANTOPRAZOLE SODIUM 40 MG PO TBEC: 40.0000 mg | DELAYED_RELEASE_TABLET | Freq: Every day | ORAL | Status: DC

## 2013-10-14 MED ORDER — DEXAMETHASONE SODIUM PHOSPHATE 10 MG/ML IJ SOLN
6.0000 mg | Freq: Once | INTRAMUSCULAR | Status: AC
  Administered 2013-10-14: 6 mg via INTRAVENOUS

## 2013-10-14 MED ORDER — PANTOPRAZOLE SODIUM 40 MG IV SOLR
40.0000 mg | Freq: Once | INTRAVENOUS | Status: AC
  Administered 2013-10-14: 40 mg via INTRAVENOUS
  Filled 2013-10-14: qty 40

## 2013-10-14 MED ORDER — SUCRALFATE 1 GM/10ML PO SUSP: 1.0000 g | Freq: Three times a day (TID) | ORAL | Status: DC

## 2013-10-14 MED ORDER — LORAZEPAM 2 MG/ML IJ SOLN
INTRAMUSCULAR | Status: AC
  Filled 2013-10-14: qty 1

## 2013-10-14 MED ORDER — SODIUM CHLORIDE 0.9 % IV SOLN
500.0000 mL | INTRAVENOUS | Status: DC
  Administered 2013-10-14: 500 mL via INTRAVENOUS

## 2013-10-14 NOTE — Telephone Encounter (Signed)
per Terri to sch  & work in [facility] lab @10 :30. Pt has been notified

## 2013-10-14 NOTE — Telephone Encounter (Signed)
Call report rcvd from Call a Nurse dtd 10/13/13 9:39 pm.  Pt relative Clemensia reports patient had problems with symptoms over the weekend.  1st treatment was 3 hour taxol/carbo on Friday.  Called and spoke with patient.  She reports aching joints and fatigue - not sleeping well because she can't get comfortable.  Tightness in her chest, nausea, heaving.    2 episodes of vomiting over the weekend.  Pt reports tightness in her chest is relieved with coughing. Pt reports coughing up "dark" blood.  No temperature.  Pt reports she is drinking but not eating.  No bowel movements - patient's normal pattern is daily.  Family members were with her over the weekend and kept up with her meds - she took Ativan Friday evening and alternated Ativan and Zofran Sat and Sunday.    Per Dr. Marko Plume, pt needs to be seen.  Per Retta Mac, pt to come in have labs drawn and see Cindy.  Lab orders entered.  POF sent - triage and scheduling notified.   Per pt - can come in now.  Let pt know 1030 for labs, then to see Retta Mac.

## 2013-10-14 NOTE — Telephone Encounter (Signed)
s/w pt and son re appt for 7/14 @ 2:45pm. per 7/13 pof lb/IVF's 7/14. per son LL informed them this appt would be if needed and he will call back tomorrow depending on how pt feels. message to chemo scheduler to add IVF's.

## 2013-10-14 NOTE — Progress Notes (Signed)
OFFICE PROGRESS NOTE   10/14/2013   Physicians:Emma Nada Maclachlan, MD , Aloha Gell   INTERVAL HISTORY:   Patient is seen for unscheduled visit due to problems since first neoadjuvant chemotherapy on 10-11-13, given  for recently diagnosed advanced serous gyn carcinoma. She is accompanied by son. They report nausea, vomiting and dry heaves ongoing since the chemotherapy, with some streaking bright red blood on 10-12-13 and minimal dark material thought blood early this AM. Antiemetics have been difficult to keep down. She has tried to drink fluids, including pedialyte, and has been able to keep down oatmeal and tea this AM and mashed potatoes and yogurt yesterday. Bowels have not moved since 10-11-13 despite stool softeners. She has had generalized aches consistent with taxol, not relieved with tylenol but better after one hydrocodone 5-325 last pm. She also has lower abdominal soreness as previously. Son and other family have been with her thru the weekend.  She does not have PAC.  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 14cm adnexal mass.     Endometrial cancer determined by uterine biopsy   09/16/2013 Initial Diagnosis Endometrial cancer determined by uterine biopsy, and endocervical biopsy  Patient had been in usual good health until acute episode of abdominal pain in Dec 2014, which eventually resolved without medical attention. More recently she has had vague diffuse abdominal discomfort, low grade nausea and abdominal bloating. She was seen in June 2015 for first gyn exam in years, PAP with AGUS and follow up biopsies of endocervix and endometrium by Dr Pamala Hurry both with serous endometrial cancer (pathology from [facility] 09-12-13, case # [id number] to be scanned into this EMR). Biopsy of cervix had normal squamous epithelium. CA125 from [facility] 09-16-13 was [id number]. She was seen by Dr  Denman George on 09-23-13, her exam remarkable for large pelvic mass minimally mobile and extending to umbilicus. She had CT CAP 09-26-13 had prominent but not enlarged juxtapericardiac lymph nodes, trace right pleural effusion, large amount of abnormal soft tissue thruout peritoneal cavity, predominately with omentum and especially LUQ, largest area 3.8 x 3.0 x 4.0 cm anterior to proximal descending colon, implants adjacent to liver, large complex multicystic lesion in pelvis inseperable from uterus and adnexae, with mass effect on bladder, questionable area in central aspect of dome of liver, borderline enlarged retroperitoneal nodes. Due to extent of disease on CT, Dr Denman George has recommended that treatment begin with chemotherapy, with consideration of interval debulking surgery depending on response to systemic treatment. Cycle one taxol carboplatin was given on 10-11-13.   Review of systems as above, also: No fever. No other bleeding. Voiding some. Central chest soreness consistent with other taxol aches. No cough, no SOB now. Unable to sleep with other symptoms. She has continued stool softeners and OTC ranitidine. Remainder of 10 point Review of Systems negative.  Objective:  Vital signs in last 24 hours:  BP 101/61  Pulse 126  Temp(Src) 98.7 F (37.1 C) (Oral)  Resp 20  Ht 5\' 4"  (1.626 m)  Wt 225 lb 9.6 oz (102.331 kg)  BMI 38.70 kg/m2  Weight is down 2 lbs from 10-03-13. Alert, oriented and appropriate, looks uncomfortable but not toxic, in wheelchair. Respirations not labored RA. No alopecia  HEENT:PERRL, sclerae not icteric. Oral mucosa actually moist now, without lesions, posterior pharynx clear.  Neck supple. No JVD.  Lymphatics:no cervical,suraclavicular  adenopathy Resp: clear to auscultation bilaterally  Cardio: regular rate and rhythm.  No gallop. GI: soft, nontender, not more distended, no discrete mass or organomegaly. Few active bowel sounds.  Musculoskeletal/ Extremities: without  pitting edema, cords, tenderness Neuro: no peripheral neuropathy. Otherwise nonfocal Skin without rash, ecchymosis, petechiae  Patient was seen again twice by MD while receiving IVF after initial exam.  Lab Results:  Results for orders placed in visit on 10/14/13  CBC WITH DIFFERENTIAL      Result Value Ref Range   WBC 10.8 (*) 3.9 - 10.3 10e3/uL   NEUT# 9.5 (*) 1.5 - 6.5 10e3/uL   HGB 9.1 (*) 11.6 - 15.9 g/dL   HCT 28.6 (*) 34.8 - 46.6 %   Platelets 355  145 - 400 10e3/uL   MCV 75.8 (*) 79.5 - 101.0 fL   MCH 24.1 (*) 25.1 - 34.0 pg   MCHC 31.8  31.5 - 36.0 g/dL   RBC 3.77  3.70 - 5.45 10e6/uL   RDW 15.9 (*) 11.2 - 14.5 %   lymph# 1.1  0.9 - 3.3 10e3/uL   MONO# 0.1  0.1 - 0.9 10e3/uL   Eosinophils Absolute 0.0  0.0 - 0.5 10e3/uL   Basophils Absolute 0.0  0.0 - 0.1 10e3/uL   NEUT% 87.9 (*) 38.4 - 76.8 %   LYMPH% 10.6 (*) 14.0 - 49.7 %   MONO% 1.3  0.0 - 14.0 %   EOS% 0.0  0.0 - 7.0 %   BASO% 0.2  0.0 - 2.0 %    CMET today Na 129, K 4.1, Cl 95, CO2 24, glu 124, BUN 31.4, creat 1.5, T bil 0.7, AP 78, AST 19, ALT 18, T prot 6.9, alb 2.7, ca 9.0  Studies/Results:  No results found.  Medications: I have reviewed the patient's current medications. Will change OTC zantac to protonix 40 mg daily and add carafate slurry 1 gm 4x daiily. Prescription given for hydrocodone 5-325, for taxol aches. She may need laxative if bowels do not move by tomorrow.  DISCUSSION: Patient has been given total 1.5 liters NS IV over next 5+ hours at [facility], with IV protonix, IV zofran, IV Ativan 0.5 mg and decadron 6 mg IV.She was able to drink hot tea, no vomiting or hematemesis, able to ambulate with to BR and felt much better by completion. HR down to 118 after 1.5 liters.  Patient and son aware of medication changes as above. She is to go to ED tonight if recurrent severe problems including any hematemesis. She will be scheduled for IVF again tomorrow at [facility], with CBC and BMET if she comes to  office, but can cancel if feeling well enough that she does not need these by 10-15-13. She is to see me as scheduled 10-18-13.  Assessment/Plan: 1. Clinical stage IV serous endometrial carcinoma: first q 3 week taxol carboplatin poorly tolerated thus far, tho symptoms all much better after interventions today. May need additional IVF tomorrow; MD with labs on 10-18-13. Expect 3-6 treatments with interval debulking surgery depending on response. May need to change to dose dense regimen, which can be better tolerated than q 3 week dosing. 2.reported small volume hematemesis: after several episodes of vomiting. None during hours observed at office today. If recurs will need GI evaluation, probably thru ED. 3.obesity  4.post left total knee replacement for degenerative arthritis  5.minimal but ongoing tobacco: discussed  6.GERD: preceded chemotherapy, probably aggravating nausea and possibly the hematemesis. Change OTC zantac to protonix 40 mg daily + carafate slurry 1 gm qid. 7.HTN, elevated cholesterol  Patient and son understood all instructions and are in agreement with plan.   Minyon Billiter P, MD   10/14/2013, 11:08 AM

## 2013-10-15 ENCOUNTER — Ambulatory Visit (HOSPITAL_BASED_OUTPATIENT_CLINIC_OR_DEPARTMENT_OTHER): Payer: BC Managed Care – PPO

## 2013-10-15 ENCOUNTER — Telehealth: Payer: Self-pay | Admitting: *Deleted

## 2013-10-15 ENCOUNTER — Other Ambulatory Visit (HOSPITAL_BASED_OUTPATIENT_CLINIC_OR_DEPARTMENT_OTHER): Payer: BC Managed Care – PPO

## 2013-10-15 VITALS — BP 134/79 | HR 116 | Temp 98.8°F | Resp 20

## 2013-10-15 DIAGNOSIS — E860 Dehydration: Secondary | ICD-10-CM

## 2013-10-15 DIAGNOSIS — C549 Malignant neoplasm of corpus uteri, unspecified: Secondary | ICD-10-CM

## 2013-10-15 DIAGNOSIS — C541 Malignant neoplasm of endometrium: Secondary | ICD-10-CM

## 2013-10-15 LAB — CBC WITH DIFFERENTIAL/PLATELET
BASO%: 0 % (ref 0.0–2.0)
Basophils Absolute: 0 10*3/uL (ref 0.0–0.1)
EOS ABS: 0 10*3/uL (ref 0.0–0.5)
EOS%: 0 % (ref 0.0–7.0)
HCT: 24.8 % — ABNORMAL LOW (ref 34.8–46.6)
HGB: 8.2 g/dL — ABNORMAL LOW (ref 11.6–15.9)
LYMPH%: 15.2 % (ref 14.0–49.7)
MCH: 24 pg — ABNORMAL LOW (ref 25.1–34.0)
MCHC: 33.1 g/dL (ref 31.5–36.0)
MCV: 72.7 fL — ABNORMAL LOW (ref 79.5–101.0)
MONO#: 0.2 10*3/uL (ref 0.1–0.9)
MONO%: 1.9 % (ref 0.0–14.0)
NEUT%: 82.9 % — ABNORMAL HIGH (ref 38.4–76.8)
NEUTROS ABS: 7.1 10*3/uL — AB (ref 1.5–6.5)
PLATELETS: 344 10*3/uL (ref 145–400)
RBC: 3.41 10*6/uL — AB (ref 3.70–5.45)
RDW: 15.4 % — ABNORMAL HIGH (ref 11.2–14.5)
WBC: 8.6 10*3/uL (ref 3.9–10.3)
lymph#: 1.3 10*3/uL (ref 0.9–3.3)

## 2013-10-15 LAB — COMPREHENSIVE METABOLIC PANEL (CC13)
ALBUMIN: 3 g/dL — AB (ref 3.5–5.0)
ALK PHOS: 75 U/L (ref 40–150)
ALT: 18 U/L (ref 0–55)
AST: 24 U/L (ref 5–34)
Anion Gap: 9 mEq/L (ref 3–11)
BUN: 25.9 mg/dL (ref 7.0–26.0)
CO2: 22 mEq/L (ref 22–29)
Calcium: 9.3 mg/dL (ref 8.4–10.4)
Chloride: 99 mEq/L (ref 98–109)
Creatinine: 1.3 mg/dL — ABNORMAL HIGH (ref 0.6–1.1)
Glucose: 98 mg/dl (ref 70–140)
POTASSIUM: 4.1 meq/L (ref 3.5–5.1)
SODIUM: 130 meq/L — AB (ref 136–145)
TOTAL PROTEIN: 7.4 g/dL (ref 6.4–8.3)
Total Bilirubin: 0.48 mg/dL (ref 0.20–1.20)

## 2013-10-15 MED ORDER — SODIUM CHLORIDE 0.9 % IV SOLN
INTRAVENOUS | Status: DC
  Administered 2013-10-15: 15:00:00 via INTRAVENOUS

## 2013-10-15 NOTE — Telephone Encounter (Signed)
I have scheduled appts and called patient 

## 2013-10-15 NOTE — Patient Instructions (Signed)

## 2013-10-16 ENCOUNTER — Telehealth: Payer: Self-pay

## 2013-10-16 NOTE — Telephone Encounter (Signed)
Received report from Call A Nurse dtd 10/16/13 4 42 am requesting call to patient about constipation.  Pt reports no bowel movement since Friday 10/11/13.  Her normal pattern was about every two days.  She feels"uncomfortable and like she has to go but nothing comes out".  She has tried stool softeners and fluids.  Advised patient to try prune juice or miralax, and if that does not work to call clinic back.  Pt voiced understanding.   Report sent to scan.

## 2013-10-17 ENCOUNTER — Telehealth: Payer: Self-pay

## 2013-10-17 ENCOUNTER — Other Ambulatory Visit: Payer: Self-pay | Admitting: Oncology

## 2013-10-17 DIAGNOSIS — C541 Malignant neoplasm of endometrium: Secondary | ICD-10-CM

## 2013-10-17 NOTE — Telephone Encounter (Signed)
Melissa Ball took prune juice and senokot-s $ tabs yesterday am and pm.  She has had agood evacuation and has continued with loose stool all day.  Pt. And on state that the liquid stool has begun to decrease.  Suggested that she not take any senokot -S today and see how she is in the am and discuss with Dr. Marko Plume.  Pt. And son appreciated the call. Suggesested that she use the [employer] this evening.  She has been taking in at least 64 oz of fluid.  Using some Pedialyte.

## 2013-10-18 ENCOUNTER — Ambulatory Visit (HOSPITAL_BASED_OUTPATIENT_CLINIC_OR_DEPARTMENT_OTHER): Payer: BC Managed Care – PPO | Admitting: Oncology

## 2013-10-18 ENCOUNTER — Other Ambulatory Visit (HOSPITAL_BASED_OUTPATIENT_CLINIC_OR_DEPARTMENT_OTHER): Payer: BC Managed Care – PPO

## 2013-10-18 ENCOUNTER — Other Ambulatory Visit: Payer: BC Managed Care – PPO

## 2013-10-18 ENCOUNTER — Telehealth: Payer: Self-pay | Admitting: Oncology

## 2013-10-18 ENCOUNTER — Ambulatory Visit: Payer: BC Managed Care – PPO | Admitting: Oncology

## 2013-10-18 ENCOUNTER — Encounter: Payer: Self-pay | Admitting: Oncology

## 2013-10-18 ENCOUNTER — Telehealth: Payer: Self-pay | Admitting: *Deleted

## 2013-10-18 ENCOUNTER — Ambulatory Visit (HOSPITAL_COMMUNITY)
Admission: RE | Admit: 2013-10-18 | Discharge: 2013-10-18 | Disposition: A | Discharge status: Home / Self Care | Payer: BC Managed Care – PPO | Source: Ambulatory Visit | Attending: Oncology | Admitting: Oncology

## 2013-10-18 ENCOUNTER — Ambulatory Visit: Payer: BC Managed Care – PPO

## 2013-10-18 ENCOUNTER — Ambulatory Visit (HOSPITAL_BASED_OUTPATIENT_CLINIC_OR_DEPARTMENT_OTHER): Payer: BC Managed Care – PPO

## 2013-10-18 VITALS — BP 100/69 | HR 115 | Temp 98.1°F | Resp 16 | Ht 64.0 in | Wt 216.9 lb

## 2013-10-18 VITALS — BP 110/65 | HR 102 | Temp 97.2°F | Resp 18

## 2013-10-18 DIAGNOSIS — K219 Gastro-esophageal reflux disease without esophagitis: Secondary | ICD-10-CM | POA: Insufficient documentation

## 2013-10-18 DIAGNOSIS — C541 Malignant neoplasm of endometrium: Secondary | ICD-10-CM

## 2013-10-18 DIAGNOSIS — E871 Hypo-osmolality and hyponatremia: Secondary | ICD-10-CM

## 2013-10-18 DIAGNOSIS — C549 Malignant neoplasm of corpus uteri, unspecified: Secondary | ICD-10-CM

## 2013-10-18 DIAGNOSIS — E876 Hypokalemia: Secondary | ICD-10-CM | POA: Insufficient documentation

## 2013-10-18 DIAGNOSIS — I10 Essential (primary) hypertension: Secondary | ICD-10-CM | POA: Insufficient documentation

## 2013-10-18 DIAGNOSIS — R1013 Epigastric pain: Secondary | ICD-10-CM

## 2013-10-18 DIAGNOSIS — F17200 Nicotine dependence, unspecified, uncomplicated: Secondary | ICD-10-CM

## 2013-10-18 DIAGNOSIS — K5900 Constipation, unspecified: Secondary | ICD-10-CM | POA: Insufficient documentation

## 2013-10-18 DIAGNOSIS — D630 Anemia in neoplastic disease: Secondary | ICD-10-CM | POA: Insufficient documentation

## 2013-10-18 LAB — CBC WITH DIFFERENTIAL/PLATELET
BASO%: 0.3 % (ref 0.0–2.0)
BASOS ABS: 0 10*3/uL (ref 0.0–0.1)
EOS%: 0.5 % (ref 0.0–7.0)
Eosinophils Absolute: 0 10*3/uL (ref 0.0–0.5)
HCT: 25.9 % — ABNORMAL LOW (ref 34.8–46.6)
HGB: 8.3 g/dL — ABNORMAL LOW (ref 11.6–15.9)
LYMPH%: 21.9 % (ref 14.0–49.7)
MCH: 23.9 pg — ABNORMAL LOW (ref 25.1–34.0)
MCHC: 31.9 g/dL (ref 31.5–36.0)
MCV: 74.8 fL — AB (ref 79.5–101.0)
MONO#: 0.3 10*3/uL (ref 0.1–0.9)
MONO%: 4.6 % (ref 0.0–14.0)
NEUT#: 4.1 10*3/uL (ref 1.5–6.5)
NEUT%: 72.7 % (ref 38.4–76.8)
PLATELETS: 391 10*3/uL (ref 145–400)
RBC: 3.46 10*6/uL — ABNORMAL LOW (ref 3.70–5.45)
RDW: 15.6 % — ABNORMAL HIGH (ref 11.2–14.5)
WBC: 5.7 10*3/uL (ref 3.9–10.3)
lymph#: 1.2 10*3/uL (ref 0.9–3.3)

## 2013-10-18 LAB — BASIC METABOLIC PANEL (CC13)
Anion Gap: 13 mEq/L — ABNORMAL HIGH (ref 3–11)
BUN: 19.9 mg/dL (ref 7.0–26.0)
CALCIUM: 9.4 mg/dL (ref 8.4–10.4)
CO2: 22 meq/L (ref 22–29)
Chloride: 96 mEq/L — ABNORMAL LOW (ref 98–109)
Creatinine: 1.4 mg/dL — ABNORMAL HIGH (ref 0.6–1.1)
Glucose: 103 mg/dl (ref 70–140)
Potassium: 3.2 mEq/L — ABNORMAL LOW (ref 3.5–5.1)
Sodium: 131 mEq/L — ABNORMAL LOW (ref 136–145)

## 2013-10-18 LAB — PREPARE RBC (CROSSMATCH)

## 2013-10-18 LAB — ABO/RH: ABO/RH(D): O POS

## 2013-10-18 MED ORDER — DEXAMETHASONE 4 MG PO TABS: ORAL_TABLET | ORAL | Status: DC

## 2013-10-18 MED ORDER — ACETAMINOPHEN 325 MG PO TABS
ORAL_TABLET | ORAL | Status: AC
  Filled 2013-10-18: qty 2

## 2013-10-18 MED ORDER — SODIUM CHLORIDE 0.9 % IV SOLN
250.0000 mL | Freq: Once | INTRAVENOUS | Status: AC
  Administered 2013-10-18: 250 mL via INTRAVENOUS

## 2013-10-18 MED ORDER — ACETAMINOPHEN 325 MG PO TABS
650.0000 mg | ORAL_TABLET | Freq: Once | ORAL | Status: AC
  Administered 2013-10-18: 650 mg via ORAL

## 2013-10-18 NOTE — Progress Notes (Signed)
OFFICE PROGRESS NOTE   10/18/2013   Physicians:Emma Nada Maclachlan, MD , Aloha Gell   INTERVAL HISTORY:  Patient is seen, together with son, in continuing attention to neoadjuvant chemotherapy in progress for advanced gyn carcinoma. She had first cycle of q 3 week taxol carboplatin on 9-98-33, this complicated by severe nausea/ vomiting, severe taxol aches and constipation. She needed IVF x 2 days earlier this week, but is doing better with po intake now. Last emesis was 2 days ago. She had no BM for ~ 5 days, resolved with laxatives and enema with loose stools multiple times yesterday, more formed x 1 this AM. Protonix and carafate have been helpful, with no further concerns for bleeding. She held BP meds earlier this week, did take antihypertensive medication this AM, does not have BP cuff at home. She has general soreness thru abdomen as previously, no acute pain and no increased distension; abdomen did feel better after bowels moved well. Food smells are not tolerable.        She does not have PAC.   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 14cm adnexal mass.     Endometrial cancer determined by uterine biopsy   09/16/2013 Initial Diagnosis Endometrial cancer determined by uterine biopsy, and endocervical biopsy  Patient had been in usual good health until acute episode of abdominal pain in Dec 2014, which eventually resolved without medical attention. More recently she has had vague diffuse abdominal discomfort, low grade nausea and abdominal bloating. She was seen in June 2015 for first gyn exam in years, PAP with AGUS and follow up biopsies of endocervix and endometrium by Dr Pamala Hurry both with serous endometrial cancer (pathology from [facility] 09-12-13, case # [id number] to be scanned into this EMR). Biopsy of cervix had normal squamous epithelium. CA125 from [facility] 09-16-13 was  964. She was seen by Dr Denman George on 09-23-13, her exam remarkable for large pelvic mass minimally mobile and extending to umbilicus. She had CT CAP 09-26-13 had prominent but not enlarged juxtapericardiac lymph nodes, trace right pleural effusion, large amount of abnormal soft tissue thruout peritoneal cavity, predominately with omentum and especially LUQ, largest area 3.8 x 3.0 x 4.0 cm anterior to proximal descending colon, implants adjacent to liver, large complex multicystic lesion in pelvis inseperable from uterus and adnexae, with mass effect on bladder, questionable area in central aspect of dome of liver, borderline enlarged retroperitoneal nodes. Due to extent of disease on CT, Dr Denman George has recommended that treatment begin with chemotherapy, with consideration of interval debulking surgery depending on response to systemic treatment. CA125 on 10-03-13 was 1287. Cycle one taxol carboplatin was given on 10-11-13, with severe N/V and taxol aches during first week.   Review of systems as above, also: No fever or symptoms of infection. No LE swelling. No bleeding now. SOB with minimal activity, fatigues easily with minimal activity. No chest pain. Tolerates small amounts of food at a time.Dentures make her gag. Remainder of 10 point Review of Systems negative.  Objective:  Vital signs in last 24 hours:  BP 100/69  Pulse 115  Temp(Src) 98.1 F (36.7 C)  Resp 16  Ht 5\' 4"  (1.626 m)  Wt 216 lb 14.4 oz (98.385 kg)  BMI 37.21 kg/m2 Weight was 227 on 10-03-13 Alert, oriented and appropriate. Ambulatory without assistance, able to get on and off exam table. Looks much more comfortable today.  No alopecia  HEENT:PERRL,  sclerae not icteric. Oral mucosa moist without lesions, posterior pharynx clear. Plates in. Neck supple. No JVD.  Lymphatics:no cervical,suraclavicular adenopathy Resp: clear to auscultation bilaterally and normal percussion bilaterally Cardio: regular rate and rhythm. No gallop. GI:  abdomen obese, softer than last evaluation, minimally tender in epigastrium, no appreciable mass or organomegaly. Few bowel sounds.  Musculoskeletal/ Extremities: without pitting edema, cords, tenderness Neuro: no peripheral neuropathy. Otherwise nonfocal Skin without rash, ecchymosis, petechiae   Lab Results:  Results for orders placed in visit on 10/18/13  CBC WITH DIFFERENTIAL      Result Value Ref Range   WBC 5.7  3.9 - 10.3 10e3/uL   NEUT# 4.1  1.5 - 6.5 10e3/uL   HGB 8.3 (*) 11.6 - 15.9 g/dL   HCT 25.9 (*) 34.8 - 46.6 %   Platelets 391  145 - 400 10e3/uL   MCV 74.8 (*) 79.5 - 101.0 fL   MCH 23.9 (*) 25.1 - 34.0 pg   MCHC 31.9  31.5 - 36.0 g/dL   RBC 3.46 (*) 3.70 - 5.45 10e6/uL   RDW 15.6 (*) 11.2 - 14.5 %   lymph# 1.2  0.9 - 3.3 10e3/uL   MONO# 0.3  0.1 - 0.9 10e3/uL   Eosinophils Absolute 0.0  0.0 - 0.5 10e3/uL   Basophils Absolute 0.0  0.0 - 0.1 10e3/uL   NEUT% 72.7  38.4 - 76.8 %   LYMPH% 21.9  14.0 - 49.7 %   MONO% 4.6  0.0 - 14.0 %   EOS% 0.5  0.0 - 7.0 %   BASO% 0.3  0.0 - 2.0 %  BASIC METABOLIC PANEL (WN02)      Result Value Ref Range   Sodium 131 (*) 136 - 145 mEq/L   Potassium 3.2 (*) 3.5 - 5.1 mEq/L   Chloride 96 (*) 98 - 109 mEq/L   CO2 22  22 - 29 mEq/L   Glucose 103  70 - 140 mg/dl   BUN 19.9  7.0 - 26.0 mg/dL   Creatinine 1.4 (*) 0.6 - 1.1 mg/dL   Calcium 9.4  8.4 - 10.4 mg/dL   Anion Gap 13 (*) 3 - 11 mEq/L    Counts ok day 8 cycle 1 today. Na and creat some better, continuing pedialyte and other po fluids  Studies/Results:  No results found.  Medications: I have reviewed the patient's current medications.She will stop lisinopril HCTZ and we will follow BPs at each visit. She does not need zantac in addition to Protonix and carafate  DISCUSSION: Son tells me that he is high Education officer, museum and coach in [place], school to resume in early August. Fridays will be easiest for him to be in [place] for treatments, and patient's niece also available  on Fridays. We have discussed changing standard q 3 week carbo taxol to dose dense regimen with carboplatin still q 3 weeks but taxol given weekly at lower doses. I believe this regimen may be much easier for her to tolerate and has been equivalent to q 3 week dosing in [place] information. We will begin this when cycle 2 due 11-01-13. I will see her as scheduled with labs on 10-23-13 and likely will need to check counts prior to day of treatment the following week. I will try to accommodate visits/ treatments on Fridays as possible, tho certainly we can have son on speaker phone if he is not able to be at office. We discussed fact that neoadjuvant chemotherapy can be more difficult when patients are already symptomatic  from extensive cancer, and hopefully she will also do better as disease responds to treatment. We have discussed diet choices and suggestions for adequate po's during chemo. She and son may benefit from [facility] dietician follow up. Patient and son are in agreement with recommendation for the weekly regimen.  Assessment/Plan: 1. Clinical stage IV serous endometrial carcinoma: first q 3 week taxol carboplatin difficult as above, and plan to change to weekly dosing with cycle 2 as this may be better tolerated. I will see her again with labs including repeat CA 125 on 10-23-13, with cycle 2 due 11-01-13 Expect 3-6 treatments with interval debulking surgery depending on response.  2.reported small volume hematemesis after several episodes of vomiting day 2-3 of cycle 1. None since and epigastric discomfort much better on protonix and carafate which she will continue. GERD preceded chemotherapy.  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 History of minimal HTN: BP lower now and they understand to hold antihypertensive. 7.constipation: resolved with laxatives/ enema this week. She will adjust laxatives to allow bowels to  move well daily if possible. 8.mild hypokalemia, with improvement in hyponatremia: hypoK likely with diarrhea from laxatives last 24 hours. She continues pedialyte and will recheck next week. Sodium better since IVF x2 this week    All questions answered and patient/son are in agreement with plan above.   Theadora Noyes P, MD   10/18/2013, 1:10 PM

## 2013-10-18 NOTE — Patient Instructions (Signed)
Stop blood pressure medicine. We will monitor blood pressure at visits off of this.  Stop zantac (ranitidine) since you are now on Protonix (pantoprazole) and carafate (sucralfate)  Should use senokot S 1-2 tablets one or two times daily, as needed to keep bowels moving daily  Do not need to push foods that don't appeal, but be sure to stay hydrated. Cold foods are better if sensitive to odors.

## 2013-10-18 NOTE — Telephone Encounter (Signed)
per pof to sch pt appts-MW sch trmts-gave pt copy of sch

## 2013-10-18 NOTE — Telephone Encounter (Signed)
Per POF staff phone call scheduled appts. Advised schedulers 

## 2013-10-18 NOTE — Progress Notes (Signed)
Patient tolerated blood transfusion without any problems.  IV d/c.  Patient waiting for her son to pick her up and c/o dry skin and itching where tape was on right arm for IV access.   Patient put lotion on this area and on her other arm and chest and then began c/o itching in these areas.  Areas appear red (possibly from scratching).   Gave patient cold/wet washcloth to wipe area off and it does feel better.  Called Dr. Marko Plume and she recommends that patient take benadryl 25mg  at home.  Discussed with patient and her son who is here to take her home.  Also wrote this on her AVS.  Patient discharged in wheelchair with son @[phone] .

## 2013-10-18 NOTE — Patient Instructions (Signed)

## 2013-10-19 ENCOUNTER — Other Ambulatory Visit: Payer: Self-pay | Admitting: Oncology

## 2013-10-19 LAB — TYPE AND SCREEN
ABO/RH(D): O POS
ANTIBODY SCREEN: NEGATIVE
Unit division: 0

## 2013-10-21 ENCOUNTER — Telehealth: Payer: Self-pay | Admitting: *Deleted

## 2013-10-21 NOTE — Telephone Encounter (Signed)
Spoke with patient regarding diarrhea as noted below from Dr. Marko Plume. Per patient, diarrhea has resolved. She is continuing to drink both Gatorade and pedialyte. She is also eating more solid foods. Encouraged patient to eat potassium rich foods including cantaloupe, strawberries, and bananas. Patient agreeable to this.

## 2013-10-21 NOTE — Telephone Encounter (Signed)
Message copied by Christa See on Mon Oct 21, 2013 11:47 AM ------      Message from: Gordy Levan      Created: Sat Oct 19, 2013 12:25 PM       K a little low on 7-17, likely from diarrhea after all the laxatives for previous constipation.       Please check on her by phone on 7-20. Suggest more pedialyte or gatorade, or cantelope or white potatoes.      (Note appetite not good, food odors bothersome since first chemo 7-10)      Counts may be dropping more, my next visit 7-22.             Thanks      Cc LA, Loeta Herst ------

## 2013-10-22 ENCOUNTER — Other Ambulatory Visit: Payer: Self-pay | Admitting: Oncology

## 2013-10-23 ENCOUNTER — Telehealth: Payer: Self-pay | Admitting: Oncology

## 2013-10-23 ENCOUNTER — Ambulatory Visit: Payer: BC Managed Care – PPO | Admitting: Oncology

## 2013-10-23 ENCOUNTER — Ambulatory Visit (HOSPITAL_BASED_OUTPATIENT_CLINIC_OR_DEPARTMENT_OTHER): Payer: BC Managed Care – PPO | Admitting: Oncology

## 2013-10-23 ENCOUNTER — Other Ambulatory Visit: Payer: BC Managed Care – PPO

## 2013-10-23 ENCOUNTER — Other Ambulatory Visit (HOSPITAL_BASED_OUTPATIENT_CLINIC_OR_DEPARTMENT_OTHER): Payer: BC Managed Care – PPO

## 2013-10-23 ENCOUNTER — Encounter: Payer: Self-pay | Admitting: Oncology

## 2013-10-23 VITALS — BP 143/89 | HR 109 | Temp 97.5°F | Resp 18 | Ht 64.0 in | Wt 218.1 lb

## 2013-10-23 DIAGNOSIS — F17200 Nicotine dependence, unspecified, uncomplicated: Secondary | ICD-10-CM

## 2013-10-23 DIAGNOSIS — Z5189 Encounter for other specified aftercare: Secondary | ICD-10-CM

## 2013-10-23 DIAGNOSIS — K219 Gastro-esophageal reflux disease without esophagitis: Secondary | ICD-10-CM

## 2013-10-23 DIAGNOSIS — C541 Malignant neoplasm of endometrium: Secondary | ICD-10-CM

## 2013-10-23 DIAGNOSIS — E876 Hypokalemia: Secondary | ICD-10-CM

## 2013-10-23 DIAGNOSIS — C549 Malignant neoplasm of corpus uteri, unspecified: Secondary | ICD-10-CM

## 2013-10-23 DIAGNOSIS — R112 Nausea with vomiting, unspecified: Secondary | ICD-10-CM

## 2013-10-23 DIAGNOSIS — I10 Essential (primary) hypertension: Secondary | ICD-10-CM

## 2013-10-23 LAB — CBC WITH DIFFERENTIAL/PLATELET
BASO%: 0.3 % (ref 0.0–2.0)
Basophils Absolute: 0 10*3/uL (ref 0.0–0.1)
EOS%: 0.3 % (ref 0.0–7.0)
Eosinophils Absolute: 0 10*3/uL (ref 0.0–0.5)
HEMATOCRIT: 27.7 % — AB (ref 34.8–46.6)
HGB: 8.9 g/dL — ABNORMAL LOW (ref 11.6–15.9)
LYMPH#: 1.6 10*3/uL (ref 0.9–3.3)
LYMPH%: 47.8 % (ref 14.0–49.7)
MCH: 24.3 pg — ABNORMAL LOW (ref 25.1–34.0)
MCHC: 32.1 g/dL (ref 31.5–36.0)
MCV: 75.7 fL — ABNORMAL LOW (ref 79.5–101.0)
MONO#: 0.7 10*3/uL (ref 0.1–0.9)
MONO%: 21.6 % — ABNORMAL HIGH (ref 0.0–14.0)
NEUT#: 1 10*3/uL — ABNORMAL LOW (ref 1.5–6.5)
NEUT%: 30 % — AB (ref 38.4–76.8)
Platelets: 345 10*3/uL (ref 145–400)
RBC: 3.66 10*6/uL — AB (ref 3.70–5.45)
RDW: 16 % — ABNORMAL HIGH (ref 11.2–14.5)
WBC: 3.4 10*3/uL — AB (ref 3.9–10.3)

## 2013-10-23 LAB — COMPREHENSIVE METABOLIC PANEL (CC13)
ALBUMIN: 3 g/dL — AB (ref 3.5–5.0)
ALT: 18 U/L (ref 0–55)
ANION GAP: 11 meq/L (ref 3–11)
AST: 20 U/L (ref 5–34)
Alkaline Phosphatase: 87 U/L (ref 40–150)
BUN: 11.2 mg/dL (ref 7.0–26.0)
CALCIUM: 9.3 mg/dL (ref 8.4–10.4)
CHLORIDE: 106 meq/L (ref 98–109)
CO2: 22 meq/L (ref 22–29)
Creatinine: 1 mg/dL (ref 0.6–1.1)
Glucose: 118 mg/dl (ref 70–140)
Potassium: 3.4 mEq/L — ABNORMAL LOW (ref 3.5–5.1)
SODIUM: 139 meq/L (ref 136–145)
TOTAL PROTEIN: 7.2 g/dL (ref 6.4–8.3)
Total Bilirubin: <0.2 mg/dL (ref 0.20–1.20)

## 2013-10-23 MED ORDER — TBO-FILGRASTIM 300 MCG/0.5ML ~~LOC~~ SOSY
300.0000 ug | PREFILLED_SYRINGE | Freq: Once | SUBCUTANEOUS | Status: AC
  Administered 2013-10-23: 300 ug via SUBCUTANEOUS
  Filled 2013-10-23: qty 0.5

## 2013-10-23 MED ORDER — SUCRALFATE 1 GM/10ML PO SUSP: 1.0000 g | Freq: Three times a day (TID) | ORAL | Status: DC

## 2013-10-23 MED ORDER — LORAZEPAM 1 MG PO TABS: ORAL_TABLET | ORAL | Status: DC

## 2013-10-23 MED ORDER — FERROUS FUMARATE 325 (106 FE) MG PO TABS: ORAL_TABLET | ORAL | Status: DC

## 2013-10-23 MED ORDER — DEXAMETHASONE 4 MG PO TABS: ORAL_TABLET | ORAL | Status: DC

## 2013-10-23 NOTE — Telephone Encounter (Signed)
per pof to sch pt appt-added appts-gave pt copy of sch

## 2013-10-23 NOTE — Patient Instructions (Addendum)
You will have granix shot today to increase your white blood cells. Sometimes you can have aching after this shot for a day or so, ok to use tylenol or other pain medicine if you need it, or Claritin (for allergies) also sometimes helps  We will be sure you have refills on decadron (steroid) to take five tablets with food 12 hours before each chemo.  We will also sent in refills for the pink Carafate and for ativan.   New prescription IF your insurance will cover it will be for Hemocyte, which is the name brand iron that is easy on stomach. If insurance will not cover Hemocyte, we will have pharmacist give you the very same thing as over the counter ferrous fumarate. Please try taking the iron tablet once daily on empty stomach (with orange juice if you tolerate that), so ~ an hour before you eat or ~ 2 hours after you eat. You do not have to take the iron every day if it bothers your stomach.  You may need to increase the Senokot S just after chemo, as chemo can cause more constipation  We will check your blood counts 1 or 2 days before chemo due on Friday 7-31, so we can be sure they are ok for chemo on 7-31. The nurse will let you know about the counts hopefully while you are still at office.

## 2013-10-23 NOTE — Telephone Encounter (Signed)
per pof to sch pt appt on 8/14-will contact Melissa to open sch per LL that day and will sch pt @ 10:30-per pt to mail updated copy

## 2013-10-23 NOTE — Progress Notes (Signed)
OFFICE PROGRESS NOTE   10/23/2013   Physicians:Emma Nada Maclachlan, MD , Aloha Gell   INTERVAL HISTORY:  Patient is seen, together with son, in continuing attention to neoadjuvant chemotherapy begun 10-11-13 for advanced gyn carcinoma. Melissa Ball had a difficult time with nausea/vomiting, constipation and taxol aches after cycle 1 standard dose carboplatin and taxol, requiring additional IVF at [facility], but is doing better now. Melissa Ball was also transfused one unit PRBCs on 10-18-13 for symptomatic anemia with Hgb 8.2, tolerated without difficulty and did feel some stronger after transfusion. Plan is to change from standard dose carbo taxol to dose dense using weekly taxol starting with cycle 2 on 11-01-13. Melissa Ball has not had gCSF until today.  Appetite is improved and Melissa Ball does not have nausea now. Protonix + carafate have been very helpful for GERD, continue. Bowels are moving daily now with one senokot S daily; they understand that Melissa Ball may need to increase this after chemo is given. Melissa Ball has had no bleeding. Melissa Ball is still fatigued, tho better than prior to PRBCs; no chest pain or SOB walking in office now. Less abdominal soreness. Melissa Ball has held BP meds this week.  Melissa Ball does not have PAC.  Son is returning to [place] today, but will be back next week.   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 14cm adnexal mass.     Endometrial cancer determined by uterine biopsy   09/16/2013 Initial Diagnosis Endometrial cancer determined by uterine biopsy, and endocervical biopsy  Patient had been in usual good health until acute episode of abdominal pain in Dec 2014, which eventually resolved without medical attention. More recently Melissa Ball has had vague diffuse abdominal discomfort, low grade nausea and abdominal bloating. Melissa Ball was seen in June 2015 for first gyn exam in years, PAP with AGUS and follow up biopsies of endocervix  and endometrium by Dr Pamala Hurry both with serous endometrial cancer (pathology from [facility] 09-12-13, case # [id number] to be scanned into this EMR). Biopsy of cervix had normal squamous epithelium. CA125 from [facility] 09-16-13 was 964. Melissa Ball was seen by Dr Denman George on 09-23-13, her exam remarkable for large pelvic mass minimally mobile and extending to umbilicus. Melissa Ball had CT CAP 09-26-13 had prominent but not enlarged juxtapericardiac lymph nodes, trace right pleural effusion, large amount of abnormal soft tissue thruout peritoneal cavity, predominately with omentum and especially LUQ, largest area 3.8 x 3.0 x 4.0 cm anterior to proximal descending colon, implants adjacent to liver, large complex multicystic lesion in pelvis inseperable from uterus and adnexae, with mass effect on bladder, questionable area in central aspect of dome of liver, borderline enlarged retroperitoneal nodes. Due to extent of disease on CT, Dr Denman George has recommended that treatment begin with chemotherapy, with consideration of interval debulking surgery depending on response to systemic treatment. Cycle one taxol carboplatin was given on 9-50-93, complicated by severe nausea/vomiting, constipation and severe taxol aches.    Review of systems as above, also: No increased peripheral neuropathy. No fever or symptoms of infection. Slept well last pm.  Remainder of 10 point Review of Systems negative.  Objective:  Vital signs in last 24 hours:  BP 143/89  Pulse 109  Temp(Src) 97.5 F (36.4 C) (Oral)  Resp 18  Ht 5\' 4"  (1.626 m)  Wt 218 lb 1.6 oz (98.93 kg)  BMI 37.42 kg/m2  Weight is up 1 lb.  Alert, oriented and appropriate. Ambulatory without assistance, able to  get on and off exam table more easily. Looks much more comfortable today, very pleasant as always. No alopecia  HEENT:PERRL, sclerae not icteric. Oral mucosa moist without lesions, posterior pharynx clear.  Neck supple. No JVD.  Lymphatics:no  cervical,suraclavicular adenopathy Resp: clear to auscultation bilaterally and normal percussion bilaterally Cardio: regular rate and rhythm. No gallop. GI: abdomen obese, more soft, nontender, not more distended, no appreciable mass or organomegaly. Some bowel sounds.  Musculoskeletal/ Extremities: without pitting edema, cords, tenderness Neuro: no peripheral neuropathy. Otherwise nonfocal. PSYCH: mood and affect brighter today Skin without rash, ecchymosis, petechiae   Lab Results:  Results for orders placed in visit on 10/23/13  CBC WITH DIFFERENTIAL      Result Value Ref Range   WBC 3.4 (*) 3.9 - 10.3 10e3/uL   NEUT# 1.0 (*) 1.5 - 6.5 10e3/uL   HGB 8.9 (*) 11.6 - 15.9 g/dL   HCT 27.7 (*) 34.8 - 46.6 %   Platelets 345  145 - 400 10e3/uL   MCV 75.7 (*) 79.5 - 101.0 fL   MCH 24.3 (*) 25.1 - 34.0 pg   MCHC 32.1  31.5 - 36.0 g/dL   RBC 3.66 (*) 3.70 - 5.45 10e6/uL   RDW 16.0 (*) 11.2 - 14.5 %   lymph# 1.6  0.9 - 3.3 10e3/uL   MONO# 0.7  0.1 - 0.9 10e3/uL   Eosinophils Absolute 0.0  0.0 - 0.5 10e3/uL   Basophils Absolute 0.0  0.0 - 0.1 10e3/uL   NEUT% 30.0 (*) 38.4 - 76.8 %   LYMPH% 47.8  14.0 - 49.7 %   MONO% 21.6 (*) 0.0 - 14.0 %   EOS% 0.3  0.0 - 7.0 %   BASO% 0.3  0.0 - 2.0 %  COMPREHENSIVE METABOLIC PANEL (YQ65)      Result Value Ref Range   Sodium 139  136 - 145 mEq/L   Potassium 3.4 (*) 3.5 - 5.1 mEq/L   Chloride 106  98 - 109 mEq/L   CO2 22  22 - 29 mEq/L   Glucose 118  70 - 140 mg/dl   BUN 11.2  7.0 - 26.0 mg/dL   Creatinine 1.0  0.6 - 1.1 mg/dL   Total Bilirubin <0.20  0.20 - 1.20 mg/dL   Alkaline Phosphatase 87  40 - 150 U/L   AST 20  5 - 34 U/L   ALT 18  0 - 55 U/L   Total Protein 7.2  6.4 - 8.3 g/dL   Albumin 3.0 (*) 3.5 - 5.0 g/dL   Calcium 9.3  8.4 - 10.4 mg/dL   Anion Gap 11  3 - 11 mEq/L    Chemistries resulted after visit, see below re K CA 125 resulted after visit 1406, this having been 1283 drawn 10-03-13 (8 days prior to start of chemo) and 964 on  09-16-13.  Studies/Results:  No results found.  Medications: I have reviewed the patient's current medications. Senokot S as above. Premed decadron for weekly taxol will be 20 mg with food 12 hrs prior to each taxol. Continue Protonix and carafate. Refill ativan prn for nausea. Will add Hemocyte/ ferrous fumarate one daily on empty stomach with OJ if tolerates. May resume lisinopril HCTZ, tho might need to hold intermittently depending on BP/ po intake as Melissa Ball continues chemo. With K a little low and resuming HCTZ will add KCl 10 mEq daily. Neupogen given x1 today. Discussed possible aches.  DISCUSSION: add oral iron as above, tho may not be able to do  this daily with OJ quite yet. Discussed following CA125 marker and clinical status prior to repeat scans probably after ~ cycle 3. Will check CBC and CMET ~ 7-29 to be sure ok for day 1 cycle 2 11-01-13, including premed decadron as scheduled. Melissa Ball will have IVF day after [place] /taxol, on 11-02-13 due to severity of N/V cycle 1.  Assessment/Plan:  1. Clinical stage IV serous endometrial carcinoma: first q 3 week taxol carboplatin difficult, so changing to weekly dose dense as this may be better tolerated; hopefully chemo will also be better tolerated as disease responds. Cycle 2 to start 11-01-13 as long as ANC >=1.5 and plt >=100k. IVF day 2. I will see her back with day 8 taxol on 11-08-13.  Expect 3-6 treatments with interval debulking surgery depending on response.  Neupogen today x1 for ANC 1.0, probably just past nadir. Will follow counts and add gCSF if needed with dose dense regimen. 2.reported small volume hematemesis after several episodes of vomiting day 2-3 of cycle 1. None since and epigastric discomfort much better on protonix and carafate which Melissa Ball will continue. GERD preceded chemotherapy.  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 History of minimal HTN:  Resume lisinopril HCTZ now for BP 143/89 today, but may need to hold this intermittently as we follow BP 7.constipation: resolved with laxatives/ enema after first chemo. Melissa Ball will adjust laxatives to allow bowels to move well daily if possible.  8.mild hypokalemia: add KCl 10 mEq daily and follow   All questions answered. Patient and son in agreement with plan above. Chemo orders and IVF orders confirmed. Time spent 25 min including >50% counseling and coordination of care.   Melissa Ball P, MD   10/23/2013, 11:24 AM

## 2013-10-24 ENCOUNTER — Other Ambulatory Visit: Payer: Self-pay | Admitting: Oncology

## 2013-10-24 ENCOUNTER — Telehealth: Payer: Self-pay | Admitting: *Deleted

## 2013-10-24 DIAGNOSIS — C541 Malignant neoplasm of endometrium: Secondary | ICD-10-CM

## 2013-10-24 LAB — CA 125: CA 125: 1405.9 U/mL — ABNORMAL HIGH (ref 0.0–30.2)

## 2013-10-24 MED ORDER — POTASSIUM CHLORIDE ER 10 MEQ PO TBCR: 10.0000 meq | EXTENDED_RELEASE_TABLET | Freq: Every day | ORAL | Status: DC

## 2013-10-24 NOTE — Telephone Encounter (Signed)
Message copied by Christa See on Thu Oct 24, 2013 10:33 AM ------      Message from: Gordy Levan      Created: Thu Oct 24, 2013 10:01 AM       Please send script for potassium 10 mEq one daily #30 1 RF to pharmacy.      Let pt know K+ still a little low and as she will be back on BP med will add low dose K as above.      thanks ------

## 2013-10-24 NOTE — Telephone Encounter (Signed)
Spoke with patient and let her know her potassium is a little low per Dr. Marko Plume. Sent script to [facility] and patient agreeable to pick it up.

## 2013-10-29 ENCOUNTER — Telehealth: Payer: Self-pay | Admitting: *Deleted

## 2013-10-29 NOTE — Telephone Encounter (Signed)
Left VM requesting RN call her back. Has questions about her chemo this week and a church event. Notified collaborative nurse for Dr. Marko Plume that patient had left VM.

## 2013-10-30 ENCOUNTER — Telehealth: Payer: Self-pay

## 2013-10-30 ENCOUNTER — Other Ambulatory Visit (HOSPITAL_BASED_OUTPATIENT_CLINIC_OR_DEPARTMENT_OTHER): Payer: BC Managed Care – PPO

## 2013-10-30 DIAGNOSIS — C541 Malignant neoplasm of endometrium: Secondary | ICD-10-CM

## 2013-10-30 DIAGNOSIS — C549 Malignant neoplasm of corpus uteri, unspecified: Secondary | ICD-10-CM

## 2013-10-30 LAB — CBC WITH DIFFERENTIAL/PLATELET
BASO%: 0.4 % (ref 0.0–2.0)
Basophils Absolute: 0 10*3/uL (ref 0.0–0.1)
EOS%: 0 % (ref 0.0–7.0)
Eosinophils Absolute: 0 10*3/uL (ref 0.0–0.5)
HCT: 28.5 % — ABNORMAL LOW (ref 34.8–46.6)
HGB: 9.1 g/dL — ABNORMAL LOW (ref 11.6–15.9)
LYMPH%: 28.1 % (ref 14.0–49.7)
MCH: 24.4 pg — AB (ref 25.1–34.0)
MCHC: 31.7 g/dL (ref 31.5–36.0)
MCV: 76.8 fL — AB (ref 79.5–101.0)
MONO#: 1 10*3/uL — ABNORMAL HIGH (ref 0.1–0.9)
MONO%: 13.7 % (ref 0.0–14.0)
NEUT#: 4.2 10*3/uL (ref 1.5–6.5)
NEUT%: 57.8 % (ref 38.4–76.8)
Platelets: 263 10*3/uL (ref 145–400)
RBC: 3.72 10*6/uL (ref 3.70–5.45)
RDW: 17.6 % — AB (ref 11.2–14.5)
WBC: 7.3 10*3/uL (ref 3.9–10.3)
lymph#: 2.1 10*3/uL (ref 0.9–3.3)

## 2013-10-30 LAB — COMPREHENSIVE METABOLIC PANEL (CC13)
ALBUMIN: 3.1 g/dL — AB (ref 3.5–5.0)
ALK PHOS: 94 U/L (ref 40–150)
ALT: 12 U/L (ref 0–55)
AST: 17 U/L (ref 5–34)
Anion Gap: 9 mEq/L (ref 3–11)
BUN: 14.7 mg/dL (ref 7.0–26.0)
CO2: 26 mEq/L (ref 22–29)
Calcium: 9.4 mg/dL (ref 8.4–10.4)
Chloride: 104 mEq/L (ref 98–109)
Creatinine: 0.9 mg/dL (ref 0.6–1.1)
Glucose: 98 mg/dl (ref 70–140)
POTASSIUM: 3.7 meq/L (ref 3.5–5.1)
SODIUM: 139 meq/L (ref 136–145)
TOTAL PROTEIN: 7.5 g/dL (ref 6.4–8.3)

## 2013-10-30 NOTE — Telephone Encounter (Signed)
Message copied by Baruch Merl on Wed Oct 30, 2013 10:07 AM ------      Message from: Evlyn Clines P      Created: Thu Oct 24, 2013  9:30 AM       I may have done written note for this, but RN please watch for CBC on 10-30-13 to be sure counts ok for her to take decadron premed/ be treated on 7-31 (ok if ANC >=1.5 and plt >=100k). Need to let patient know.      Thanks      Cc LA, KF ------

## 2013-10-30 NOTE — Telephone Encounter (Signed)
Left a message in Melissa Ball's voicemail stating lab is fine for treatment on Friday 11-01-13.  She is to take the decadron 4 mg tabs (5 tabs) with food 12 hours prior to her treatment at 0930 on 11-01-13.

## 2013-10-30 NOTE — Telephone Encounter (Signed)
Ms. Melissa Ball called back to confirm she received previous messages and understood premedication instructions.

## 2013-10-30 NOTE — Telephone Encounter (Signed)
Left a message for Ms. Petkus to call this nurse back to confirm message receive regarding treatment 11-01-13 and when to  take decadron premed Thursday 10-31-13.

## 2013-11-01 ENCOUNTER — Ambulatory Visit (HOSPITAL_BASED_OUTPATIENT_CLINIC_OR_DEPARTMENT_OTHER): Payer: BC Managed Care – PPO

## 2013-11-01 VITALS — BP 144/88 | HR 107 | Temp 97.9°F | Resp 19

## 2013-11-01 DIAGNOSIS — C541 Malignant neoplasm of endometrium: Secondary | ICD-10-CM

## 2013-11-01 DIAGNOSIS — C549 Malignant neoplasm of corpus uteri, unspecified: Secondary | ICD-10-CM

## 2013-11-01 DIAGNOSIS — Z5111 Encounter for antineoplastic chemotherapy: Secondary | ICD-10-CM

## 2013-11-01 MED ORDER — ONDANSETRON 16 MG/50ML IVPB (CHCC)
INTRAVENOUS | Status: AC
  Filled 2013-11-01: qty 16

## 2013-11-01 MED ORDER — FAMOTIDINE IN NACL 20-0.9 MG/50ML-% IV SOLN
20.0000 mg | Freq: Once | INTRAVENOUS | Status: AC
  Administered 2013-11-01: 20 mg via INTRAVENOUS

## 2013-11-01 MED ORDER — ONDANSETRON 16 MG/50ML IVPB (CHCC)
16.0000 mg | Freq: Once | INTRAVENOUS | Status: AC
  Administered 2013-11-01: 16 mg via INTRAVENOUS

## 2013-11-01 MED ORDER — SODIUM CHLORIDE 0.9 % IV SOLN
Freq: Once | INTRAVENOUS | Status: AC
  Administered 2013-11-01: 10:00:00 via INTRAVENOUS

## 2013-11-01 MED ORDER — FOSAPREPITANT DIMEGLUMINE INJECTION 150 MG
150.0000 mg | Freq: Once | INTRAVENOUS | Status: AC
  Administered 2013-11-01: 150 mg via INTRAVENOUS
  Filled 2013-11-01: qty 5

## 2013-11-01 MED ORDER — DEXAMETHASONE SODIUM PHOSPHATE 20 MG/5ML IJ SOLN
12.0000 mg | Freq: Once | INTRAMUSCULAR | Status: AC
  Administered 2013-11-01: 12 mg via INTRAVENOUS

## 2013-11-01 MED ORDER — DIPHENHYDRAMINE HCL 50 MG/ML IJ SOLN
50.0000 mg | Freq: Once | INTRAMUSCULAR | Status: AC
  Administered 2013-11-01: 50 mg via INTRAVENOUS

## 2013-11-01 MED ORDER — PACLITAXEL CHEMO INJECTION 300 MG/50ML
80.0000 mg/m2 | Freq: Once | INTRAVENOUS | Status: AC
  Administered 2013-11-01: 168 mg via INTRAVENOUS
  Filled 2013-11-01: qty 28

## 2013-11-01 MED ORDER — DEXAMETHASONE SODIUM PHOSPHATE 20 MG/5ML IJ SOLN
INTRAMUSCULAR | Status: AC
  Filled 2013-11-01: qty 5

## 2013-11-01 MED ORDER — DIPHENHYDRAMINE HCL 50 MG/ML IJ SOLN
INTRAMUSCULAR | Status: AC
  Filled 2013-11-01: qty 1

## 2013-11-01 MED ORDER — SODIUM CHLORIDE 0.9 % IV SOLN
580.0000 mg | Freq: Once | INTRAVENOUS | Status: AC
  Administered 2013-11-01: 580 mg via INTRAVENOUS
  Filled 2013-11-01: qty 58

## 2013-11-01 MED ORDER — FAMOTIDINE IN NACL 20-0.9 MG/50ML-% IV SOLN
INTRAVENOUS | Status: AC
  Filled 2013-11-01: qty 50

## 2013-11-01 NOTE — Patient Instructions (Signed)
[facility] Discharge Instructions for Patients Receiving Chemotherapy  Today you received the following chemotherapy agents: Taxol, Carboplatin   To help prevent nausea and vomiting after your treatment, we encourage you to take your nausea medication as prescribed by your physician.    If you develop nausea and vomiting that is not controlled by your nausea medication, call the clinic.   BELOW ARE SYMPTOMS THAT SHOULD BE REPORTED IMMEDIATELY:  *FEVER GREATER THAN 100.5 F  *CHILLS WITH OR WITHOUT FEVER  NAUSEA AND VOMITING THAT IS NOT CONTROLLED WITH YOUR NAUSEA MEDICATION  *UNUSUAL SHORTNESS OF BREATH  *UNUSUAL BRUISING OR BLEEDING  TENDERNESS IN MOUTH AND THROAT WITH OR WITHOUT PRESENCE OF ULCERS  *URINARY PROBLEMS  *BOWEL PROBLEMS  UNUSUAL RASH Items with * indicate a potential emergency and should be followed up as soon as possible.  Feel free to call the clinic you have any questions or concerns. The clinic phone number is [phone].    

## 2013-11-02 ENCOUNTER — Ambulatory Visit (HOSPITAL_BASED_OUTPATIENT_CLINIC_OR_DEPARTMENT_OTHER): Payer: BC Managed Care – PPO

## 2013-11-02 VITALS — BP 134/85 | HR 82 | Temp 97.6°F | Resp 17

## 2013-11-02 DIAGNOSIS — C549 Malignant neoplasm of corpus uteri, unspecified: Secondary | ICD-10-CM

## 2013-11-02 DIAGNOSIS — C541 Malignant neoplasm of endometrium: Secondary | ICD-10-CM

## 2013-11-02 DIAGNOSIS — E860 Dehydration: Secondary | ICD-10-CM

## 2013-11-02 MED ORDER — SODIUM CHLORIDE 0.9 % IV SOLN
INTRAVENOUS | Status: DC
  Administered 2013-11-02: 08:00:00 via INTRAVENOUS

## 2013-11-02 NOTE — Patient Instructions (Signed)
Dehydration, Adult Dehydration is when you lose more fluids from the body than you take in. Vital organs like the kidneys, brain, and heart cannot function without a proper amount of fluids and salt. Any loss of fluids from the body can cause dehydration.  CAUSES   Vomiting.  Diarrhea.  Excessive sweating.  Excessive urine output.  Fever. SYMPTOMS  Mild dehydration  Thirst.  Dry lips.  Slightly dry mouth. Moderate dehydration  Very dry mouth.  Sunken eyes.  Skin does not bounce back quickly when lightly pinched and released.  Dark urine and decreased urine production.  Decreased tear production.  Headache. Severe dehydration  Very dry mouth.  Extreme thirst.  Rapid, weak pulse (more than 100 beats per minute at rest).  Cold hands and feet.  Not able to sweat in spite of heat and temperature.  Rapid breathing.  Blue lips.  Confusion and lethargy.  Difficulty being awakened.  Minimal urine production.  No tears. DIAGNOSIS  Your caregiver will diagnose dehydration based on your symptoms and your exam. Blood and urine tests will help confirm the diagnosis. The diagnostic evaluation should also identify the cause of dehydration. TREATMENT  Treatment of mild or moderate dehydration can often be done at home by increasing the amount of fluids that you drink. It is best to drink small amounts of fluid more often. Drinking too much at one time can make vomiting worse. Refer to the home care instructions below. Severe dehydration needs to be treated at the hospital where you will probably be given intravenous (IV) fluids that contain water and electrolytes. HOME CARE INSTRUCTIONS   Ask your caregiver about specific rehydration instructions.  Drink enough fluids to keep your urine clear or pale yellow.  Drink small amounts frequently if you have nausea and vomiting.  Eat as you normally do.  Avoid:  Foods or drinks high in sugar.  Carbonated  drinks.  Juice.  Extremely hot or cold fluids.  Drinks with caffeine.  Fatty, greasy foods.  Alcohol.  Tobacco.  Overeating.  Gelatin desserts.  Wash your hands well to avoid spreading bacteria and viruses.  Only take over-the-counter or prescription medicines for pain, discomfort, or fever as directed by your caregiver.  Ask your caregiver if you should continue all prescribed and over-the-counter medicines.  Keep all follow-up appointments with your caregiver. SEEK MEDICAL CARE IF:  You have abdominal pain and it increases or stays in one area (localizes).  You have a rash, stiff neck, or severe headache.  You are irritable, sleepy, or difficult to awaken.  You are weak, dizzy, or extremely thirsty. SEEK IMMEDIATE MEDICAL CARE IF:   You are unable to keep fluids down or you get worse despite treatment.  You have frequent episodes of vomiting or diarrhea.  You have blood or green matter (bile) in your vomit.  You have blood in your stool or your stool looks black and tarry.  You have not urinated in 6 to 8 hours, or you have only urinated a small amount of very dark urine.  You have a fever.  You faint. MAKE SURE YOU:   Understand these instructions.  Will watch your condition.  Will get help right away if you are not doing well or get worse. Document Released: 03/21/2005 Document Revised: 06/13/2011 Document Reviewed: 11/08/2010 ExitCare Patient Information 2015 [employer]. This information is not intended to replace advice given to you by your health care provider. Make sure you discuss any questions you have with your health care   provider.  

## 2013-11-04 ENCOUNTER — Encounter: Payer: Self-pay | Admitting: *Deleted

## 2013-11-04 NOTE — Progress Notes (Signed)
Melissa Ball Psychosocial Distress Screening Clinical Social Work  Clinical Social Work was referred by distress screening protocol.  The patient scored a 7 on the Psychosocial Distress Thermometer which indicates moderate distress. Clinical Social Worker contacted patient by phone to assess for distress and other psychosocial needs.  Melissa Ball reported she is doing well and is tolerating treatments.  She expressed anxiety with "whether or not it's working" and shared she is unable to go to church because she gets easily fatigued.  CSW encouraged patient to attend GYN support group to gain support from other women with similar experiences.  The patient was very interested in attending support group and shared she enjoys receiving and giving support.  Melissa Ball is concerned with obtaining medications- stated she is unable to afford all medications.  CSW strongly encouraged her to meet with financial advocates to determine if she qualifies for the [employer].  CSW will continue to follow patient as needed.  ONCBCN DISTRESS SCREENING 10/18/2013  Screening Type Initial Screening  Mark the number that describes how much distress you have been experiencing in the past week 7  Practical problem type Food  Family Problem type (No Data)  Emotional problem type Nervousness/Anxiety;Adjusting to illness  Spiritual/Religous concerns type (No Data)  Information Concerns Type (No Data)  Physical Problem type Nausea/vomiting;Sleep/insomnia;Loss of appetitie;Constipation/diarrhea  Physician notified of physical symptoms Yes  Referral to clinical social work Yes    Clinical Social Worker follow up needed: Yes.    If yes, follow up plan:  Will follow up in infusion room on 11/08/13.

## 2013-11-05 ENCOUNTER — Other Ambulatory Visit: Payer: Self-pay | Admitting: Oncology

## 2013-11-05 ENCOUNTER — Telehealth: Payer: Self-pay | Admitting: *Deleted

## 2013-11-05 DIAGNOSIS — D649 Anemia, unspecified: Secondary | ICD-10-CM

## 2013-11-05 NOTE — Telephone Encounter (Signed)
Spoke with Darlena and she passed Dr. Mariana Kaufman message from below along to [place] who will follow up with patient.

## 2013-11-05 NOTE — Telephone Encounter (Signed)
Message copied by Christa See on Tue Nov 05, 2013  9:51 AM ------      Message from: Gordy Levan      Created: Tue Nov 05, 2013  7:15 AM       Per social worker note, patient cannot afford meds. Please have financial counselors meet with her at least on 8-7 when she is at office. If Gaspar Bidding or other could call her ahead and let her know what information they need for [place] etc, that would help.      Thank you            KF, [place]      Cc Darlena ------

## 2013-11-08 ENCOUNTER — Encounter: Payer: Self-pay | Admitting: Oncology

## 2013-11-08 ENCOUNTER — Other Ambulatory Visit (HOSPITAL_BASED_OUTPATIENT_CLINIC_OR_DEPARTMENT_OTHER): Payer: BC Managed Care – PPO

## 2013-11-08 ENCOUNTER — Ambulatory Visit (HOSPITAL_BASED_OUTPATIENT_CLINIC_OR_DEPARTMENT_OTHER): Payer: BC Managed Care – PPO | Admitting: Oncology

## 2013-11-08 ENCOUNTER — Other Ambulatory Visit: Payer: BC Managed Care – PPO

## 2013-11-08 ENCOUNTER — Ambulatory Visit (HOSPITAL_BASED_OUTPATIENT_CLINIC_OR_DEPARTMENT_OTHER): Payer: BC Managed Care – PPO

## 2013-11-08 VITALS — BP 132/90 | HR 113 | Temp 98.1°F | Resp 18 | Ht 64.0 in | Wt 220.3 lb

## 2013-11-08 DIAGNOSIS — K219 Gastro-esophageal reflux disease without esophagitis: Secondary | ICD-10-CM

## 2013-11-08 DIAGNOSIS — C549 Malignant neoplasm of corpus uteri, unspecified: Secondary | ICD-10-CM

## 2013-11-08 DIAGNOSIS — D649 Anemia, unspecified: Secondary | ICD-10-CM

## 2013-11-08 DIAGNOSIS — I10 Essential (primary) hypertension: Secondary | ICD-10-CM

## 2013-11-08 DIAGNOSIS — E876 Hypokalemia: Secondary | ICD-10-CM

## 2013-11-08 DIAGNOSIS — C541 Malignant neoplasm of endometrium: Secondary | ICD-10-CM

## 2013-11-08 DIAGNOSIS — K5900 Constipation, unspecified: Secondary | ICD-10-CM

## 2013-11-08 DIAGNOSIS — Z5111 Encounter for antineoplastic chemotherapy: Secondary | ICD-10-CM

## 2013-11-08 LAB — COMPREHENSIVE METABOLIC PANEL (CC13)
ALT: 14 U/L (ref 0–55)
ANION GAP: 10 meq/L (ref 3–11)
AST: 18 U/L (ref 5–34)
Albumin: 3.5 g/dL (ref 3.5–5.0)
Alkaline Phosphatase: 87 U/L (ref 40–150)
BILIRUBIN TOTAL: 0.31 mg/dL (ref 0.20–1.20)
BUN: 21.2 mg/dL (ref 7.0–26.0)
CO2: 21 meq/L — AB (ref 22–29)
CREATININE: 1 mg/dL (ref 0.6–1.1)
Calcium: 9.7 mg/dL (ref 8.4–10.4)
Chloride: 105 mEq/L (ref 98–109)
GLUCOSE: 122 mg/dL (ref 70–140)
Potassium: 4.3 mEq/L (ref 3.5–5.1)
SODIUM: 135 meq/L — AB (ref 136–145)
TOTAL PROTEIN: 8.3 g/dL (ref 6.4–8.3)

## 2013-11-08 LAB — CBC WITH DIFFERENTIAL/PLATELET
BASO%: 0.3 % (ref 0.0–2.0)
Basophils Absolute: 0 10*3/uL (ref 0.0–0.1)
EOS%: 0 % (ref 0.0–7.0)
Eosinophils Absolute: 0 10*3/uL (ref 0.0–0.5)
HEMATOCRIT: 29.6 % — AB (ref 34.8–46.6)
HGB: 9.3 g/dL — ABNORMAL LOW (ref 11.6–15.9)
LYMPH%: 20.2 % (ref 14.0–49.7)
MCH: 24.2 pg — ABNORMAL LOW (ref 25.1–34.0)
MCHC: 31.5 g/dL (ref 31.5–36.0)
MCV: 77 fL — AB (ref 79.5–101.0)
MONO#: 0 10*3/uL — ABNORMAL LOW (ref 0.1–0.9)
MONO%: 1.1 % (ref 0.0–14.0)
NEUT%: 78.4 % — ABNORMAL HIGH (ref 38.4–76.8)
NEUTROS ABS: 2.7 10*3/uL (ref 1.5–6.5)
PLATELETS: 263 10*3/uL (ref 145–400)
RBC: 3.84 10*6/uL (ref 3.70–5.45)
RDW: 17.8 % — ABNORMAL HIGH (ref 11.2–14.5)
WBC: 3.4 10*3/uL — ABNORMAL LOW (ref 3.9–10.3)
lymph#: 0.7 10*3/uL — ABNORMAL LOW (ref 0.9–3.3)

## 2013-11-08 LAB — IRON AND TIBC CHCC
%SAT: 12 % — ABNORMAL LOW (ref 21–57)
IRON: 42 ug/dL (ref 41–142)
TIBC: 365 ug/dL (ref 236–444)
UIBC: 323 ug/dL (ref 120–384)

## 2013-11-08 LAB — HOLD TUBE, BLOOD BANK

## 2013-11-08 LAB — FERRITIN CHCC: FERRITIN: 882 ng/mL — AB (ref 9–269)

## 2013-11-08 MED ORDER — ONDANSETRON 8 MG/NS 50 ML IVPB
INTRAVENOUS | Status: AC
  Filled 2013-11-08: qty 8

## 2013-11-08 MED ORDER — PACLITAXEL CHEMO INJECTION 300 MG/50ML
80.0000 mg/m2 | Freq: Once | INTRAVENOUS | Status: AC
  Administered 2013-11-08: 168 mg via INTRAVENOUS
  Filled 2013-11-08: qty 28

## 2013-11-08 MED ORDER — FAMOTIDINE IN NACL 20-0.9 MG/50ML-% IV SOLN
INTRAVENOUS | Status: AC
  Filled 2013-11-08: qty 50

## 2013-11-08 MED ORDER — DIPHENHYDRAMINE HCL 50 MG/ML IJ SOLN
50.0000 mg | Freq: Once | INTRAMUSCULAR | Status: AC
  Administered 2013-11-08: 50 mg via INTRAVENOUS

## 2013-11-08 MED ORDER — DIPHENHYDRAMINE HCL 50 MG/ML IJ SOLN
INTRAMUSCULAR | Status: AC
  Filled 2013-11-08: qty 1

## 2013-11-08 MED ORDER — DEXAMETHASONE SODIUM PHOSPHATE 20 MG/5ML IJ SOLN
INTRAMUSCULAR | Status: AC
  Filled 2013-11-08: qty 5

## 2013-11-08 MED ORDER — DEXAMETHASONE SODIUM PHOSPHATE 20 MG/5ML IJ SOLN
20.0000 mg | Freq: Once | INTRAMUSCULAR | Status: AC
  Administered 2013-11-08: 20 mg via INTRAVENOUS

## 2013-11-08 MED ORDER — ONDANSETRON 8 MG/50ML IVPB (CHCC)
8.0000 mg | Freq: Once | INTRAVENOUS | Status: AC
  Administered 2013-11-08: 8 mg via INTRAVENOUS

## 2013-11-08 MED ORDER — SODIUM CHLORIDE 0.9 % IV SOLN
Freq: Once | INTRAVENOUS | Status: AC
  Administered 2013-11-08: 13:00:00 via INTRAVENOUS

## 2013-11-08 MED ORDER — FAMOTIDINE IN NACL 20-0.9 MG/50ML-% IV SOLN
20.0000 mg | Freq: Once | INTRAVENOUS | Status: AC
  Administered 2013-11-08: 20 mg via INTRAVENOUS

## 2013-11-08 NOTE — Progress Notes (Signed)
OFFICE PROGRESS NOTE   11/08/2013   Physicians:Emma Nada Maclachlan, MD , Aloha Gell   INTERVAL HISTORY:   Patient is seen, together with niece, in continuing attention to advanced gyn carcinoma, for which chemotherapy is underway as initial intervention.She had a difficult time with full dose taxol carboplatin cycle 1 on 10-11-13, with ANC nadir 1.0 by day 13, severe taxol aches and severe constipation. She was transfused 2 units PRBCs on 10-25-13, hgb 8.2 and symptomatic then. Chemo regimen was changed to dose dense weekly as of day 1 cycle 2 on 11-01-13, which she has tolerated much better thus far; she has not had gCSF thus far cycle 2. She is due day 8 cycle 2 today, and did take premed decadron 20 mg 12 hrs prior.  Patient seems better overall, with abdomen "softer", bowels moving well daily, appetite much better and energy better. She was tired after singing at a funeral and at church this past weekend. Taxol aches were minimal with the 80 mg/m2 dose, and she denies peripheral neuropathy symptoms.  She does not have PAC.  Son is back in [place] as he teaches and coaches at high school there. Family locally is extremely close and supportive. 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 14cm adnexal mass.     Endometrial cancer determined by uterine biopsy   09/16/2013 Initial Diagnosis Endometrial cancer determined by uterine biopsy, and endocervical biopsy  Patient had been in usual good health until acute episode of abdominal pain in Dec 2014, which eventually resolved without medical attention. More recently she has had vague diffuse abdominal discomfort, low grade nausea and abdominal bloating. She was seen in June 2015 for first gyn exam in years, PAP with [facility]  and follow up biopsies of endocervix and endometrium by Dr Pamala Hurry both with serous endometrial cancer (pathology from [facility] 09-12-13, case # [id number] to be scanned into this EMR). Biopsy of cervix had normal squamous epithelium. CA125 from [facility] 09-16-13 was 964. She was seen by Dr Denman George on 09-23-13, her exam remarkable for large pelvic mass minimally mobile and extending to umbilicus. She had CT CAP 09-26-13 had prominent but not enlarged juxtapericardiac lymph nodes, trace right pleural effusion, large amount of abnormal soft tissue thruout peritoneal cavity, predominately with omentum and especially LUQ, largest area 3.8 x 3.0 x 4.0 cm anterior to proximal descending colon, implants adjacent to liver, large complex multicystic lesion in pelvis inseperable from uterus and adnexae, with mass effect on bladder, questionable area in central aspect of dome of liver, borderline enlarged retroperitoneal nodes. Due to extent of disease on CT, Dr Denman George has recommended that treatment begin with chemotherapy, with consideration of interval debulking surgery depending on response to systemic treatment. Cycle one taxol carboplatin was given on 09-17-05, complicated by severe nausea/vomiting, constipation and severe taxol aches. Cycle 2 day 1 11-01-13 was changed to dose dense carbo taxol.   Review of systems as above, also: No bleeding, is tolerating oral iron now. No SOB walking today. No new or different pain. No LE swelling. Nausea controlled with prn antiemetics. Remainder of 10 point Review of Systems negative.  Objective:  Vital signs in last 24 hours:  BP 132/90  Pulse 113  Temp(Src) 98.1 F (36.7 C) (Oral)  Resp 18  Ht 5\' 4"  (  1.626 m)  Wt 220 lb 4.8 oz (99.927 kg)  BMI 37.80 kg/m2 weight is up 2 lbs  Alert, oriented and appropriate. Ambulatory without assistance. Able to get on and off exam table with minimal assistance. Looks much more comfortable overall, tho note she is on  steroids. Alopecia  HEENT:PERRL, sclerae not icteric. Oral mucosa moist without lesions, posterior pharynx clear.  Neck supple. No JVD.  Lymphatics:no cervical,suraclavicular, axillary adenopathy Resp: clear to auscultation bilaterally and normal percussion bilaterally Cardio: regular rate and rhythm. No gallop. GI: full but soft, nontender, not as distended, no clear mass or organomegaly. Some bowel sounds Musculoskeletal/ Extremities: without pitting edema, cords, tenderness Neuro: no peripheral neuropathy. Otherwise nonfocal. PSYCH cheerful mood and appropriate affect. Skin without rash, ecchymosis, petechiae   Lab Results:  Results for orders placed in visit on 11/08/13  CBC WITH DIFFERENTIAL      Result Value Ref Range   WBC 3.4 (*) 3.9 - 10.3 10e3/uL   NEUT# 2.7  1.5 - 6.5 10e3/uL   HGB 9.3 (*) 11.6 - 15.9 g/dL   HCT 29.6 (*) 34.8 - 46.6 %   Platelets 263  145 - 400 10e3/uL   MCV 77.0 (*) 79.5 - 101.0 fL   MCH 24.2 (*) 25.1 - 34.0 pg   MCHC 31.5  31.5 - 36.0 g/dL   RBC 3.84  3.70 - 5.45 10e6/uL   RDW 17.8 (*) 11.2 - 14.5 %   lymph# 0.7 (*) 0.9 - 3.3 10e3/uL   MONO# 0.0 (*) 0.1 - 0.9 10e3/uL   Eosinophils Absolute 0.0  0.0 - 0.5 10e3/uL   Basophils Absolute 0.0  0.0 - 0.1 10e3/uL   NEUT% 78.4 (*) 38.4 - 76.8 %   LYMPH% 20.2  14.0 - 49.7 %   MONO% 1.1  0.0 - 14.0 %   EOS% 0.0  0.0 - 7.0 %   BASO% 0.3  0.0 - 2.0 %  COMPREHENSIVE METABOLIC PANEL (IW58)      Result Value Ref Range   Sodium 135 (*) 136 - 145 mEq/L   Potassium 4.3  3.5 - 5.1 mEq/L   Chloride 105  98 - 109 mEq/L   CO2 21 (*) 22 - 29 mEq/L   Glucose 122  70 - 140 mg/dl   BUN 21.2  7.0 - 26.0 mg/dL   Creatinine 1.0  0.6 - 1.1 mg/dL   Total Bilirubin 0.31  0.20 - 1.20 mg/dL   Alkaline Phosphatase 87  40 - 150 U/L   AST 18  5 - 34 U/L   ALT 14  0 - 55 U/L   Total Protein 8.3  6.4 - 8.3 g/dL   Albumin 3.5  3.5 - 5.0 g/dL   Calcium 9.7  8.4 - 10.4 mg/dL   Anion Gap 10  3 - 11 mEq/L  FERRITIN [facility]       Result Value Ref Range   Ferritin 882 (*) 9 - 269 ng/ml  IRON AND TIBC [facility]      Result Value Ref Range   Iron 42  41 - 142 ug/dL   TIBC 365  236 - 444 ug/dL   UIBC 323  120 - 384 ug/dL   %SAT 12 (*) 21 - 57 %  HOLD TUBE, BLOOD BANK      Result Value Ref Range   Hold Tube, Blood Bank Blood Bank Order Cancelled      CA 124 from 7-22 was 1405, this having been 1283 on 7-2 and 964 on 09-16-13 (first  chemo 7-10) Studies/Results: none new  Medications: I have reviewed the patient's current medications. When she completes present ferrous sulfate, we will change to hemocyte/ ferrous fumarate for better GI tolerance.  DISCUSSION: We have clarified that 3 weeks' treatments = one cycle on dose dense regimen. We have discussed following CA 125 marker (next 11-15-13) and clinical status between imaging, with scans possibly after 3-4 cycles if questions, or possibly a bit longer if she is obviously improving by other measures. She will see Dr Denman George for consideration of interval debulking after she has made some good improvement with chemotherapy. She and niece understand that she needs to be careful around babies or others who might have contagious illness.  Assessment/Plan:   1. Clinical stage IV serous endometrial carcinoma:Tolerating dose dense regimen much better so far, + disease also seems improved clinically over presentation, which is helping symptoms. She will have day 8 cycle 2 today and I will see her next week with day 15 cycle 2. She may need gCSF depending on labs next week. Expect 3-6 treatments with interval debulking surgery depending on response.  2.reported small volume hematemesis after several episodes of vomiting day 2-3 of cycle 1. None since and epigastric discomfort much better on protonix and carafate which she will continue. GERD preceded chemotherapy.  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  HTN: Resumed lisinopril HCTZ , follow BP  7.constipation: continue laxatives to allow bowels to move well daily if possible.  8.mild hypokalemia: improved on KCl 10 mEq daily with the HCTZ, follow 9. Anemia: mutlifactorial, post transfusion 2 units PRBCs 7-17 and continuing oral iron. Will change iron preparation when next refill needed. Follow.  Chemo orders confirmed. All questions answered. She knows that I am glad to speak with son if desired. They will call if concerns prior to visit next week.  Message to schedulers re ok to move MD appointments to Fridays if my clinics allow.    LIVESAY,LENNIS P, MD   11/08/2013, 1:18 PM

## 2013-11-08 NOTE — Patient Instructions (Signed)
[facility] Discharge Instructions for Patients Receiving Chemotherapy  Today you received the following chemotherapy agents Taxol  To help prevent nausea and vomiting after your treatment, we encourage you to take your nausea medication Zofran 8 mg  One every 8 eight hours as needed by mouth  If you develop nausea and vomiting that is not controlled by your nausea medication, call the clinic.   BELOW ARE SYMPTOMS THAT SHOULD BE REPORTED IMMEDIATELY:  *FEVER GREATER THAN 100.5 F  *CHILLS WITH OR WITHOUT FEVER  NAUSEA AND VOMITING THAT IS NOT CONTROLLED WITH YOUR NAUSEA MEDICATION  *UNUSUAL SHORTNESS OF BREATH  *UNUSUAL BRUISING OR BLEEDING  TENDERNESS IN MOUTH AND THROAT WITH OR WITHOUT PRESENCE OF ULCERS  *URINARY PROBLEMS  *BOWEL PROBLEMS  UNUSUAL RASH Items with * indicate a potential emergency and should be followed up as soon as possible.  Feel free to call the clinic you have any questions or concerns. The clinic phone number is [phone].

## 2013-11-10 ENCOUNTER — Other Ambulatory Visit: Payer: Self-pay | Admitting: Oncology

## 2013-11-11 ENCOUNTER — Encounter: Payer: Self-pay | Admitting: Oncology

## 2013-11-11 ENCOUNTER — Telehealth: Payer: Self-pay | Admitting: *Deleted

## 2013-11-11 NOTE — Telephone Encounter (Signed)
Cld & spoke w/pt in re to sch-gave pt time & dates of appt-adv I would mail appts out to address onacct-pt understood

## 2013-11-11 NOTE — Telephone Encounter (Signed)
Per staff message and POF I have scheduled appts. Advised scheduler of appts. JMW  

## 2013-11-11 NOTE — Progress Notes (Signed)
Pt is approved for $400 [facility] and $400 [employer] grants.

## 2013-11-15 ENCOUNTER — Encounter: Payer: Self-pay | Admitting: Oncology

## 2013-11-15 ENCOUNTER — Ambulatory Visit (HOSPITAL_BASED_OUTPATIENT_CLINIC_OR_DEPARTMENT_OTHER): Payer: BC Managed Care – PPO

## 2013-11-15 ENCOUNTER — Telehealth: Payer: Self-pay | Admitting: Oncology

## 2013-11-15 ENCOUNTER — Telehealth: Payer: Self-pay | Admitting: *Deleted

## 2013-11-15 ENCOUNTER — Other Ambulatory Visit: Payer: BC Managed Care – PPO

## 2013-11-15 ENCOUNTER — Other Ambulatory Visit: Payer: Self-pay | Admitting: *Deleted

## 2013-11-15 ENCOUNTER — Other Ambulatory Visit (HOSPITAL_BASED_OUTPATIENT_CLINIC_OR_DEPARTMENT_OTHER): Payer: BC Managed Care – PPO

## 2013-11-15 ENCOUNTER — Ambulatory Visit (HOSPITAL_BASED_OUTPATIENT_CLINIC_OR_DEPARTMENT_OTHER): Payer: BC Managed Care – PPO | Admitting: Oncology

## 2013-11-15 VITALS — BP 130/82 | HR 98 | Temp 97.8°F | Resp 20 | Ht 64.0 in | Wt 221.4 lb

## 2013-11-15 DIAGNOSIS — E876 Hypokalemia: Secondary | ICD-10-CM

## 2013-11-15 DIAGNOSIS — C549 Malignant neoplasm of corpus uteri, unspecified: Secondary | ICD-10-CM

## 2013-11-15 DIAGNOSIS — C541 Malignant neoplasm of endometrium: Secondary | ICD-10-CM

## 2013-11-15 DIAGNOSIS — Z5111 Encounter for antineoplastic chemotherapy: Secondary | ICD-10-CM

## 2013-11-15 DIAGNOSIS — I10 Essential (primary) hypertension: Secondary | ICD-10-CM

## 2013-11-15 LAB — CBC WITH DIFFERENTIAL/PLATELET
BASO%: 0.1 % (ref 0.0–2.0)
BASOS ABS: 0 10*3/uL (ref 0.0–0.1)
EOS ABS: 0 10*3/uL (ref 0.0–0.5)
EOS%: 0 % (ref 0.0–7.0)
HCT: 28.2 % — ABNORMAL LOW (ref 34.8–46.6)
HEMOGLOBIN: 9.1 g/dL — AB (ref 11.6–15.9)
LYMPH%: 31.4 % (ref 14.0–49.7)
MCH: 24.8 pg — AB (ref 25.1–34.0)
MCHC: 32.2 g/dL (ref 31.5–36.0)
MCV: 77 fL — AB (ref 79.5–101.0)
MONO#: 0.1 10*3/uL (ref 0.1–0.9)
MONO%: 2.6 % (ref 0.0–14.0)
NEUT%: 65.9 % (ref 38.4–76.8)
NEUTROS ABS: 1.5 10*3/uL (ref 1.5–6.5)
PLATELETS: 268 10*3/uL (ref 145–400)
RBC: 3.66 10*6/uL — ABNORMAL LOW (ref 3.70–5.45)
RDW: 19 % — ABNORMAL HIGH (ref 11.2–14.5)
WBC: 2.3 10*3/uL — ABNORMAL LOW (ref 3.9–10.3)
lymph#: 0.7 10*3/uL — ABNORMAL LOW (ref 0.9–3.3)

## 2013-11-15 LAB — COMPREHENSIVE METABOLIC PANEL (CC13)
ALT: 12 U/L (ref 0–55)
AST: 16 U/L (ref 5–34)
Albumin: 3.5 g/dL (ref 3.5–5.0)
Alkaline Phosphatase: 79 U/L (ref 40–150)
Anion Gap: 10 mEq/L (ref 3–11)
BUN: 19.9 mg/dL (ref 7.0–26.0)
CALCIUM: 10 mg/dL (ref 8.4–10.4)
CO2: 21 mEq/L — ABNORMAL LOW (ref 22–29)
Chloride: 106 mEq/L (ref 98–109)
Creatinine: 1 mg/dL (ref 0.6–1.1)
GLUCOSE: 133 mg/dL (ref 70–140)
Potassium: 3.9 mEq/L (ref 3.5–5.1)
Sodium: 137 mEq/L (ref 136–145)
Total Bilirubin: <0.2 mg/dL (ref 0.20–1.20)
Total Protein: 7.9 g/dL (ref 6.4–8.3)

## 2013-11-15 LAB — CA 125: CA 125: 221.7 U/mL — ABNORMAL HIGH (ref 0.0–30.2)

## 2013-11-15 MED ORDER — HEPARIN SOD (PORK) LOCK FLUSH 100 UNIT/ML IV SOLN
500.0000 [IU] | Freq: Once | INTRAVENOUS | Status: DC | PRN
  Filled 2013-11-15: qty 5

## 2013-11-15 MED ORDER — ONDANSETRON 8 MG/NS 50 ML IVPB
INTRAVENOUS | Status: AC
  Filled 2013-11-15: qty 8

## 2013-11-15 MED ORDER — DIPHENHYDRAMINE HCL 50 MG/ML IJ SOLN
INTRAMUSCULAR | Status: AC
  Filled 2013-11-15: qty 1

## 2013-11-15 MED ORDER — DIPHENHYDRAMINE HCL 50 MG/ML IJ SOLN
50.0000 mg | Freq: Once | INTRAMUSCULAR | Status: AC
  Administered 2013-11-15: 50 mg via INTRAVENOUS

## 2013-11-15 MED ORDER — FAMOTIDINE IN NACL 20-0.9 MG/50ML-% IV SOLN
INTRAVENOUS | Status: AC
  Filled 2013-11-15: qty 50

## 2013-11-15 MED ORDER — SODIUM CHLORIDE 0.9 % IJ SOLN
10.0000 mL | INTRAMUSCULAR | Status: DC | PRN
  Filled 2013-11-15: qty 10

## 2013-11-15 MED ORDER — FAMOTIDINE IN NACL 20-0.9 MG/50ML-% IV SOLN
20.0000 mg | Freq: Once | INTRAVENOUS | Status: AC
  Administered 2013-11-15: 20 mg via INTRAVENOUS

## 2013-11-15 MED ORDER — SODIUM CHLORIDE 0.9 % IV SOLN
Freq: Once | INTRAVENOUS | Status: AC
  Administered 2013-11-15: 12:00:00 via INTRAVENOUS

## 2013-11-15 MED ORDER — ONDANSETRON 8 MG/50ML IVPB (CHCC)
8.0000 mg | Freq: Once | INTRAVENOUS | Status: AC
  Administered 2013-11-15: 8 mg via INTRAVENOUS

## 2013-11-15 MED ORDER — DEXAMETHASONE SODIUM PHOSPHATE 20 MG/5ML IJ SOLN
INTRAMUSCULAR | Status: AC
  Filled 2013-11-15: qty 5

## 2013-11-15 MED ORDER — POTASSIUM CHLORIDE ER 10 MEQ PO TBCR: 10.0000 meq | EXTENDED_RELEASE_TABLET | Freq: Every day | ORAL | Status: DC

## 2013-11-15 MED ORDER — DEXAMETHASONE SODIUM PHOSPHATE 20 MG/5ML IJ SOLN
20.0000 mg | Freq: Once | INTRAMUSCULAR | Status: AC
Start: 2013-11-15 — End: 2013-11-15
  Administered 2013-11-15: 20 mg via INTRAVENOUS

## 2013-11-15 MED ORDER — DEXAMETHASONE 4 MG PO TABS: ORAL_TABLET | ORAL | Status: DC

## 2013-11-15 MED ORDER — DEXTROSE 5 % IV SOLN
80.0000 mg/m2 | Freq: Once | INTRAVENOUS | Status: AC
  Administered 2013-11-15: 168 mg via INTRAVENOUS
  Filled 2013-11-15: qty 28

## 2013-11-15 NOTE — Telephone Encounter (Signed)
Per staff phone call and POF I have schedueld appts. Scheduler advised of appts.  JMW  

## 2013-11-15 NOTE — Telephone Encounter (Signed)
per pof to sch pt appt-sch & gave pt copy of sch °

## 2013-11-15 NOTE — Patient Instructions (Signed)
[facility] Discharge Instructions for Patients Receiving Chemotherapy  Today you received the following chemotherapy agents Taxol  To help prevent nausea and vomiting after your treatment, we encourage you to take your nausea medication Zofran 8 mg  One every 8 eight hours as needed by mouth  If you develop nausea and vomiting that is not controlled by your nausea medication, call the clinic.   BELOW ARE SYMPTOMS THAT SHOULD BE REPORTED IMMEDIATELY:  *FEVER GREATER THAN 100.5 F  *CHILLS WITH OR WITHOUT FEVER  NAUSEA AND VOMITING THAT IS NOT CONTROLLED WITH YOUR NAUSEA MEDICATION  *UNUSUAL SHORTNESS OF BREATH  *UNUSUAL BRUISING OR BLEEDING  TENDERNESS IN MOUTH AND THROAT WITH OR WITHOUT PRESENCE OF ULCERS  *URINARY PROBLEMS  *BOWEL PROBLEMS  UNUSUAL RASH Items with * indicate a potential emergency and should be followed up as soon as possible.  Feel free to call the clinic you have any questions or concerns. The clinic phone number is [phone].

## 2013-11-15 NOTE — Progress Notes (Signed)
OFFICE PROGRESS NOTE   11/15/2013   Physicians:Emma Nada Maclachlan, MD , Aloha Gell   INTERVAL HISTORY:   Patient is seen, initially alone and then with niece, in continuing attention to chemotherapy in process as initial intervention for advanced gyn carcinoma. She had first cycle with standard dose carboplatin and taxol, tolerated poorly, and is now due day 15 cycle 2 with regimen changed to dose dense carbo taxol. She has tolerated the dose dense regimen much better and clinically is much improved since beginning chemotherapy. She has not had gCSF with dose dense regimen thus far.  Patient took premedicatin decadron last pm as instructed. She denies difficulty sleeping. Appetite is very good without nausea, vomiting or GERD symptoms, abdomen feels soft and bowels are moving regularly. She has had no fever or symptoms of infection and no bleeding.  She does not have PAC.    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 14cm adnexal mass.     Endometrial cancer determined by uterine biopsy   09/16/2013 Initial Diagnosis Endometrial cancer determined by uterine biopsy, and endocervical biopsy  Patient had been in usual good health until acute episode of abdominal pain in Dec 2014, which eventually resolved without medical attention. More recently she has had vague diffuse abdominal discomfort, low grade nausea and abdominal bloating. She was seen in June 2015 for first gyn exam in years, PAP with AGUS and follow up biopsies of endocervix and endometrium by Dr Pamala Hurry both with serous endometrial cancer (pathology from [facility] 09-12-13, case # [id number] to be scanned into this EMR). Biopsy of cervix had normal squamous epithelium. CA125 from [facility] 09-16-13 was 964. She was seen by Dr Denman George on 09-23-13, her exam remarkable for large pelvic mass minimally mobile and extending to umbilicus.  She had CT CAP 09-26-13 had prominent but not enlarged juxtapericardiac lymph nodes, trace right pleural effusion, large amount of abnormal soft tissue thruout peritoneal cavity, predominately with omentum and especially LUQ, largest area 3.8 x 3.0 x 4.0 cm anterior to proximal descending colon, implants adjacent to liver, large complex multicystic lesion in pelvis inseperable from uterus and adnexae, with mass effect on bladder, questionable area in central aspect of dome of liver, borderline enlarged retroperitoneal nodes. Due to extent of disease on CT, Dr Denman George has recommended that treatment begin with chemotherapy, with consideration of interval debulking surgery depending on response to systemic treatment. Cycle one taxol carboplatin was given on 4-40-10, complicated by severe nausea/vomiting, constipation and severe taxol aches. CA 125 on 10-23-13 was 1400. Cycle 2 chemo changed to dose dense carbo taxol, tolerated well.    Review of systems as above, also: No increased SOB or other respiratory symptoms. No chest pain or other new or different pain. No LE swelling. Minimal intermittent tingling in tips of fingers only. Alopecia. She has needed more than one attempt for IV starts. Remainder of 10 point Review of Systems negative.  Objective:  Vital signs in last 24 hours:  BP 130/82  Pulse 98  Temp(Src) 97.8 F (36.6 C) (Oral)  Resp 20  Ht 5\' 4"  (1.626 m)  Wt 221 lb 6.4 oz (100.426 kg)  BMI 37.98 kg/m2 weight is up 1 lb.  Alert, oriented and appropriate. Ambulatory without difficulty.  Alopecia  HEENT:PERRL, sclerae not icteric. Oral mucosa moist without lesions, posterior pharynx clear.  Neck supple. No JVD.  Lymphatics:no cervical,suraclavicular or inguinal adenopathy Resp: clear to auscultation bilaterally  and normal percussion bilaterally Cardio: regular rate and rhythm. No gallop. GI: abdomen obese, soft, nontender, not distended, no appreciable mass or organomegaly. Normally  active bowel sounds.  Musculoskeletal/ Extremities: without pitting edema, cords, tenderness Neuro: no peripheral neuropathy. Otherwise nonfocal Skin without rash, ecchymosis, petechiae. Discoloration at sites of initial IVs right forearm, no erythema or tenderness. Breasts: without dominant mass, skin or nipple findings.    Lab Results:  Results for orders placed in visit on 11/15/13  CBC WITH DIFFERENTIAL      Result Value Ref Range   WBC 2.3 (*) 3.9 - 10.3 10e3/uL   NEUT# 1.5  1.5 - 6.5 10e3/uL   HGB 9.1 (*) 11.6 - 15.9 g/dL   HCT 28.2 (*) 34.8 - 46.6 %   Platelets 268  145 - 400 10e3/uL   MCV 77.0 (*) 79.5 - 101.0 fL   MCH 24.8 (*) 25.1 - 34.0 pg   MCHC 32.2  31.5 - 36.0 g/dL   RBC 3.66 (*) 3.70 - 5.45 10e6/uL   RDW 19.0 (*) 11.2 - 14.5 %   lymph# 0.7 (*) 0.9 - 3.3 10e3/uL   MONO# 0.1  0.1 - 0.9 10e3/uL   Eosinophils Absolute 0.0  0.0 - 0.5 10e3/uL   Basophils Absolute 0.0  0.0 - 0.1 10e3/uL   NEUT% 65.9  38.4 - 76.8 %   LYMPH% 31.4  14.0 - 49.7 %   MONO% 2.6  0.0 - 14.0 %   EOS% 0.0  0.0 - 7.0 %   BASO% 0.1  0.0 - 2.0 %  COMPREHENSIVE METABOLIC PANEL (ZO10)      Result Value Ref Range   Sodium 137  136 - 145 mEq/L   Potassium 3.9  3.5 - 5.1 mEq/L   Chloride 106  98 - 109 mEq/L   CO2 21 (*) 22 - 29 mEq/L   Glucose 133  70 - 140 mg/dl   BUN 19.9  7.0 - 26.0 mg/dL   Creatinine 1.0  0.6 - 1.1 mg/dL   Total Bilirubin <0.20  0.20 - 1.20 mg/dL   Alkaline Phosphatase 79  40 - 150 U/L   AST 16  5 - 34 U/L   ALT 12  0 - 55 U/L   Total Protein 7.9  6.4 - 8.3 g/dL   Albumin 3.5  3.5 - 5.0 g/dL   Calcium 10.0  8.4 - 10.4 mg/dL   Anion Gap 10  3 - 11 mEq/L    CA125 available after visit down to 221, this having been 1406 shortly after cycle 1 chemo. We will be back in touch with patient to let her know this result.  Studies/Results:  No results found.  Medications: I have reviewed the patient's current medications, including going thru all of her medication bottles. She  has qualified for financial assistance thru [facility], however I have also told her to use either protonix or previous ranitidine or carafate (not H2 blocker + carafate now). I have given her written instructions on prn meds (tylenol, ativan, zofran) and on meds that can be optional just for now (MVI, calcium/D, lipitor)  DISCUSSION: meds as above. MD to see on Fridays with treatments when that is possible.   Assessment/Plan: 1. Clinical stage IV serous endometrial carcinoma: Clinically much improved and tolerating present regimen well. May need gCSF added with next cycle, but ok for day 15 taxol today. Expect 3-6 treatments with possible interval debulking surgery depending on response.  2.GERD much improved, meds decreased as  above. 3.morbid obesity  4.post left total knee replacement for degenerative arthritis  5.minimal but ongoing tobacco: discussed  6.HTN: BP good today back on lisinopril. Follow. 7.constipation: resolved with laxatives 8.mild hypokalemia: improved, follow    She will have day 1 cycle 3 on 11-22-12 as long as ANC >=1.5 and plt >=100k (if counts do not meet parameters, may still be able to give taxol only that day - RN to discuss with this MD if so). I will see her with day 8 cycle 3. Chemo orders confirmed. All questions answered. Niece to update patient's son by phone; I am glad to speak with him also if requested.    Niki Cosman P, MD   11/15/2013, 11:50 AM

## 2013-11-18 ENCOUNTER — Telehealth: Payer: Self-pay | Admitting: *Deleted

## 2013-11-18 NOTE — Telephone Encounter (Signed)
Per Dr. Marko Plume, I informed patient that her CA 125 is down from 1405.9 to 221.7. Patient verbalized understanding.

## 2013-11-19 ENCOUNTER — Telehealth: Payer: Self-pay | Admitting: Oncology

## 2013-11-21 ENCOUNTER — Other Ambulatory Visit: Payer: Self-pay | Admitting: Oncology

## 2013-11-22 ENCOUNTER — Other Ambulatory Visit (HOSPITAL_BASED_OUTPATIENT_CLINIC_OR_DEPARTMENT_OTHER): Payer: BC Managed Care – PPO

## 2013-11-22 ENCOUNTER — Ambulatory Visit (HOSPITAL_BASED_OUTPATIENT_CLINIC_OR_DEPARTMENT_OTHER): Payer: BC Managed Care – PPO

## 2013-11-22 VITALS — BP 143/96 | HR 110 | Temp 98.0°F | Resp 18

## 2013-11-22 DIAGNOSIS — C541 Malignant neoplasm of endometrium: Secondary | ICD-10-CM

## 2013-11-22 DIAGNOSIS — C549 Malignant neoplasm of corpus uteri, unspecified: Secondary | ICD-10-CM

## 2013-11-22 DIAGNOSIS — Z5111 Encounter for antineoplastic chemotherapy: Secondary | ICD-10-CM

## 2013-11-22 LAB — CBC WITH DIFFERENTIAL/PLATELET
BASO%: 0.2 % (ref 0.0–2.0)
Basophils Absolute: 0 10*3/uL (ref 0.0–0.1)
EOS%: 0 % (ref 0.0–7.0)
Eosinophils Absolute: 0 10*3/uL (ref 0.0–0.5)
HCT: 31 % — ABNORMAL LOW (ref 34.8–46.6)
HGB: 10 g/dL — ABNORMAL LOW (ref 11.6–15.9)
LYMPH#: 0.9 10*3/uL (ref 0.9–3.3)
LYMPH%: 29.3 % (ref 14.0–49.7)
MCH: 25 pg — AB (ref 25.1–34.0)
MCHC: 32.1 g/dL (ref 31.5–36.0)
MCV: 78 fL — ABNORMAL LOW (ref 79.5–101.0)
MONO#: 0 10*3/uL — AB (ref 0.1–0.9)
MONO%: 1.2 % (ref 0.0–14.0)
NEUT#: 2.2 10*3/uL (ref 1.5–6.5)
NEUT%: 69.3 % (ref 38.4–76.8)
Platelets: 258 10*3/uL (ref 145–400)
RBC: 3.98 10*6/uL (ref 3.70–5.45)
RDW: 21 % — AB (ref 11.2–14.5)
WBC: 3.2 10*3/uL — AB (ref 3.9–10.3)

## 2013-11-22 LAB — COMPREHENSIVE METABOLIC PANEL (CC13)
ALT: 10 U/L (ref 0–55)
ANION GAP: 11 meq/L (ref 3–11)
AST: 17 U/L (ref 5–34)
Albumin: 3.8 g/dL (ref 3.5–5.0)
Alkaline Phosphatase: 76 U/L (ref 40–150)
BUN: 28.1 mg/dL — ABNORMAL HIGH (ref 7.0–26.0)
CALCIUM: 10.3 mg/dL (ref 8.4–10.4)
CHLORIDE: 103 meq/L (ref 98–109)
CO2: 21 meq/L — AB (ref 22–29)
CREATININE: 1.1 mg/dL (ref 0.6–1.1)
Glucose: 125 mg/dl (ref 70–140)
POTASSIUM: 3.9 meq/L (ref 3.5–5.1)
SODIUM: 135 meq/L — AB (ref 136–145)
TOTAL PROTEIN: 8.4 g/dL — AB (ref 6.4–8.3)
Total Bilirubin: <0.2 mg/dL (ref 0.20–1.20)

## 2013-11-22 MED ORDER — FOSAPREPITANT DIMEGLUMINE INJECTION 150 MG
150.0000 mg | Freq: Once | INTRAVENOUS | Status: AC
  Administered 2013-11-22: 150 mg via INTRAVENOUS
  Filled 2013-11-22: qty 5

## 2013-11-22 MED ORDER — DEXAMETHASONE SODIUM PHOSPHATE 20 MG/5ML IJ SOLN
12.0000 mg | Freq: Once | INTRAMUSCULAR | Status: AC
  Administered 2013-11-22: 12 mg via INTRAVENOUS

## 2013-11-22 MED ORDER — DEXAMETHASONE SODIUM PHOSPHATE 20 MG/5ML IJ SOLN
INTRAMUSCULAR | Status: AC
  Filled 2013-11-22: qty 5

## 2013-11-22 MED ORDER — SODIUM CHLORIDE 0.9 % IV SOLN
566.5000 mg | Freq: Once | INTRAVENOUS | Status: AC
  Administered 2013-11-22: 570 mg via INTRAVENOUS
  Filled 2013-11-22: qty 57

## 2013-11-22 MED ORDER — ONDANSETRON 16 MG/50ML IVPB (CHCC)
16.0000 mg | Freq: Once | INTRAVENOUS | Status: AC
  Administered 2013-11-22: 16 mg via INTRAVENOUS

## 2013-11-22 MED ORDER — SODIUM CHLORIDE 0.9 % IV SOLN
Freq: Once | INTRAVENOUS | Status: AC
  Administered 2013-11-22: 11:00:00 via INTRAVENOUS

## 2013-11-22 MED ORDER — FAMOTIDINE IN NACL 20-0.9 MG/50ML-% IV SOLN
20.0000 mg | Freq: Once | INTRAVENOUS | Status: AC
Start: 2013-11-22 — End: 2013-11-22
  Administered 2013-11-22: 20 mg via INTRAVENOUS

## 2013-11-22 MED ORDER — FAMOTIDINE IN NACL 20-0.9 MG/50ML-% IV SOLN
INTRAVENOUS | Status: AC
  Filled 2013-11-22: qty 50

## 2013-11-22 MED ORDER — PACLITAXEL CHEMO INJECTION 300 MG/50ML
80.0000 mg/m2 | Freq: Once | INTRAVENOUS | Status: AC
  Administered 2013-11-22: 168 mg via INTRAVENOUS
  Filled 2013-11-22: qty 28

## 2013-11-22 MED ORDER — DIPHENHYDRAMINE HCL 50 MG/ML IJ SOLN
50.0000 mg | Freq: Once | INTRAMUSCULAR | Status: AC
  Administered 2013-11-22: 50 mg via INTRAVENOUS

## 2013-11-22 MED ORDER — ONDANSETRON 16 MG/50ML IVPB (CHCC)
INTRAVENOUS | Status: AC
  Filled 2013-11-22: qty 16

## 2013-11-22 MED ORDER — DIPHENHYDRAMINE HCL 50 MG/ML IJ SOLN
INTRAMUSCULAR | Status: AC
  Filled 2013-11-22: qty 1

## 2013-11-22 NOTE — Progress Notes (Signed)
          Patient Demographics     Patient Name Sex DOB SSN Address Phone    Melissa Ball, Melissa Ball Female 1950-07-19 [id number] [place] [address] [phone] (Home)              Message Received: 6 days ago     Gordy Levan, MD Baruch Merl, RN; Christa See, RN            For day 1 cycle 3 chemo on Friday 8-21.  FYI Treatment parameters in chemo orders under nursing communication for infusion RN.   In addition to those parameters, if ANC <=1.5 and she is treated on 8-21, she will also need granix 480 on 8-22. Will need apt and orders for 8-22 if so.   Thanks  Cc LA, KF

## 2013-11-22 NOTE — Progress Notes (Signed)
Ms. Melissa Ball today 11-22-13 is 2.2 so she does not need granix tomorrow per Dr. Mariana Kaufman parameters noted below.

## 2013-11-22 NOTE — Patient Instructions (Signed)
Cancer Center Discharge Instructions for Patients Receiving Chemotherapy  Today you received the following chemotherapy agents taxol/carboplatin  To help prevent nausea and vomiting after your treatment, we encourage you to take your nausea medication as directed   If you develop nausea and vomiting that is not controlled by your nausea medication, call the clinic.   BELOW ARE SYMPTOMS THAT SHOULD BE REPORTED IMMEDIATELY:  *FEVER GREATER THAN 100.5 F  *CHILLS WITH OR WITHOUT FEVER  NAUSEA AND VOMITING THAT IS NOT CONTROLLED WITH YOUR NAUSEA MEDICATION  *UNUSUAL SHORTNESS OF BREATH  *UNUSUAL BRUISING OR BLEEDING  TENDERNESS IN MOUTH AND THROAT WITH OR WITHOUT PRESENCE OF ULCERS  *URINARY PROBLEMS  *BOWEL PROBLEMS  UNUSUAL RASH Items with * indicate a potential emergency and should be followed up as soon as possible.  Feel free to call the clinic you have any questions or concerns. The clinic phone number is [phone].  

## 2013-11-24 ENCOUNTER — Other Ambulatory Visit: Payer: Self-pay | Admitting: Oncology

## 2013-11-24 DIAGNOSIS — C541 Malignant neoplasm of endometrium: Secondary | ICD-10-CM

## 2013-11-27 ENCOUNTER — Other Ambulatory Visit: Payer: BC Managed Care – PPO

## 2013-11-27 ENCOUNTER — Ambulatory Visit: Payer: BC Managed Care – PPO | Admitting: Oncology

## 2013-11-29 ENCOUNTER — Ambulatory Visit (HOSPITAL_BASED_OUTPATIENT_CLINIC_OR_DEPARTMENT_OTHER): Payer: BC Managed Care – PPO | Admitting: Oncology

## 2013-11-29 ENCOUNTER — Other Ambulatory Visit: Payer: BC Managed Care – PPO

## 2013-11-29 ENCOUNTER — Other Ambulatory Visit (HOSPITAL_BASED_OUTPATIENT_CLINIC_OR_DEPARTMENT_OTHER): Payer: BC Managed Care – PPO

## 2013-11-29 ENCOUNTER — Telehealth: Payer: Self-pay | Admitting: Oncology

## 2013-11-29 ENCOUNTER — Ambulatory Visit (HOSPITAL_BASED_OUTPATIENT_CLINIC_OR_DEPARTMENT_OTHER): Payer: BC Managed Care – PPO

## 2013-11-29 ENCOUNTER — Encounter: Payer: Self-pay | Admitting: Oncology

## 2013-11-29 VITALS — BP 117/93 | HR 110 | Temp 98.2°F | Resp 19 | Ht 64.0 in | Wt 214.1 lb

## 2013-11-29 VITALS — BP 107/73 | HR 96 | Resp 18

## 2013-11-29 DIAGNOSIS — C549 Malignant neoplasm of corpus uteri, unspecified: Secondary | ICD-10-CM

## 2013-11-29 DIAGNOSIS — C541 Malignant neoplasm of endometrium: Secondary | ICD-10-CM

## 2013-11-29 DIAGNOSIS — Z5111 Encounter for antineoplastic chemotherapy: Secondary | ICD-10-CM

## 2013-11-29 DIAGNOSIS — R1110 Vomiting, unspecified: Secondary | ICD-10-CM

## 2013-11-29 LAB — CBC WITH DIFFERENTIAL/PLATELET
BASO%: 0 % (ref 0.0–2.0)
Basophils Absolute: 0 10*3/uL (ref 0.0–0.1)
EOS%: 0 % (ref 0.0–7.0)
Eosinophils Absolute: 0 10*3/uL (ref 0.0–0.5)
HEMATOCRIT: 29.7 % — AB (ref 34.8–46.6)
HGB: 9.7 g/dL — ABNORMAL LOW (ref 11.6–15.9)
LYMPH#: 0.9 10*3/uL (ref 0.9–3.3)
LYMPH%: 35.6 % (ref 14.0–49.7)
MCH: 25.4 pg (ref 25.1–34.0)
MCHC: 32.7 g/dL (ref 31.5–36.0)
MCV: 77.7 fL — ABNORMAL LOW (ref 79.5–101.0)
MONO#: 0.1 10*3/uL (ref 0.1–0.9)
MONO%: 2.4 % (ref 0.0–14.0)
NEUT#: 1.5 10*3/uL (ref 1.5–6.5)
NEUT%: 62 % (ref 38.4–76.8)
Platelets: 248 10*3/uL (ref 145–400)
RBC: 3.82 10*6/uL (ref 3.70–5.45)
RDW: 20.9 % — ABNORMAL HIGH (ref 11.2–14.5)
WBC: 2.5 10*3/uL — AB (ref 3.9–10.3)

## 2013-11-29 LAB — COMPREHENSIVE METABOLIC PANEL (CC13)
ALT: 15 U/L (ref 0–55)
AST: 17 U/L (ref 5–34)
Albumin: 3.7 g/dL (ref 3.5–5.0)
Alkaline Phosphatase: 75 U/L (ref 40–150)
Anion Gap: 11 mEq/L (ref 3–11)
BILIRUBIN TOTAL: 0.35 mg/dL (ref 0.20–1.20)
BUN: 26.6 mg/dL — ABNORMAL HIGH (ref 7.0–26.0)
CO2: 21 mEq/L — ABNORMAL LOW (ref 22–29)
CREATININE: 1 mg/dL (ref 0.6–1.1)
Calcium: 9.9 mg/dL (ref 8.4–10.4)
Chloride: 101 mEq/L (ref 98–109)
Glucose: 123 mg/dl (ref 70–140)
Potassium: 3.9 mEq/L (ref 3.5–5.1)
SODIUM: 134 meq/L — AB (ref 136–145)
TOTAL PROTEIN: 7.9 g/dL (ref 6.4–8.3)

## 2013-11-29 MED ORDER — SODIUM CHLORIDE 0.9 % IV SOLN
Freq: Once | INTRAVENOUS | Status: AC
  Administered 2013-11-29: 11:00:00 via INTRAVENOUS

## 2013-11-29 MED ORDER — DEXAMETHASONE SODIUM PHOSPHATE 20 MG/5ML IJ SOLN
INTRAMUSCULAR | Status: AC
  Filled 2013-11-29: qty 5

## 2013-11-29 MED ORDER — FAMOTIDINE IN NACL 20-0.9 MG/50ML-% IV SOLN
20.0000 mg | Freq: Once | INTRAVENOUS | Status: AC
  Administered 2013-11-29: 20 mg via INTRAVENOUS

## 2013-11-29 MED ORDER — PACLITAXEL CHEMO INJECTION 300 MG/50ML
80.0000 mg/m2 | Freq: Once | INTRAVENOUS | Status: AC
  Administered 2013-11-29: 168 mg via INTRAVENOUS
  Filled 2013-11-29: qty 28

## 2013-11-29 MED ORDER — FERROUS FUMARATE 325 (106 FE) MG PO TABS: ORAL_TABLET | ORAL | Status: DC

## 2013-11-29 MED ORDER — ONDANSETRON 8 MG/50ML IVPB (CHCC)
8.0000 mg | Freq: Once | INTRAVENOUS | Status: AC
  Administered 2013-11-29: 8 mg via INTRAVENOUS

## 2013-11-29 MED ORDER — FAMOTIDINE IN NACL 20-0.9 MG/50ML-% IV SOLN
INTRAVENOUS | Status: AC
  Filled 2013-11-29: qty 50

## 2013-11-29 MED ORDER — DEXAMETHASONE SODIUM PHOSPHATE 20 MG/5ML IJ SOLN
20.0000 mg | Freq: Once | INTRAMUSCULAR | Status: AC
  Administered 2013-11-29: 20 mg via INTRAVENOUS

## 2013-11-29 MED ORDER — DIPHENHYDRAMINE HCL 50 MG/ML IJ SOLN
50.0000 mg | Freq: Once | INTRAMUSCULAR | Status: AC
  Administered 2013-11-29: 50 mg via INTRAVENOUS

## 2013-11-29 MED ORDER — ONDANSETRON 8 MG/NS 50 ML IVPB
INTRAVENOUS | Status: AC
  Filled 2013-11-29: qty 8

## 2013-11-29 MED ORDER — DIPHENHYDRAMINE HCL 50 MG/ML IJ SOLN
INTRAMUSCULAR | Status: AC
  Filled 2013-11-29: qty 1

## 2013-11-29 NOTE — Telephone Encounter (Signed)
cld pt to give remaining inj appt time & dates-pt understood

## 2013-11-29 NOTE — Patient Instructions (Signed)
[facility] Discharge Instructions for Patients Receiving Chemotherapy  Today you received the following chemotherapy agents: taxol  To help prevent nausea and vomiting after your treatment, we encourage you to take your nausea medication as prescribed.    If you develop nausea and vomiting that is not controlled by your nausea medication, call the clinic.   BELOW ARE SYMPTOMS THAT SHOULD BE REPORTED IMMEDIATELY:  *FEVER GREATER THAN 100.5 F  *CHILLS WITH OR WITHOUT FEVER  NAUSEA AND VOMITING THAT IS NOT CONTROLLED WITH YOUR NAUSEA MEDICATION  *UNUSUAL SHORTNESS OF BREATH  *UNUSUAL BRUISING OR BLEEDING  TENDERNESS IN MOUTH AND THROAT WITH OR WITHOUT PRESENCE OF ULCERS  *URINARY PROBLEMS  *BOWEL PROBLEMS  UNUSUAL RASH Items with * indicate a potential emergency and should be followed up as soon as possible.  Feel free to call the clinic you have any questions or concerns. The clinic phone number is [phone].    

## 2013-11-29 NOTE — Progress Notes (Signed)
OFFICE PROGRESS NOTE   11/29/2013   Physicians:Emma Nada Maclachlan, MD , Aloha Gell   INTERVAL HISTORY:   Patient is seen, alone for visit, due day 8 cycle 3 dose dense carboplatin taxol today, chemotherapy as initial intervention for advanced gyn carcinoma. Clinically she is responding well to chemotherapy, with improvement in physical exam and CA 125 marker, which is down from 1405 to 221 by 11-15-13.  Patient has noticed a little unsteadiness at times in past 2 days. She has some sinus congestion and clear drainage, and ears have been poppping. She has tender skin area midback this week, better with hot shower there. She has had no fever, appetite is mostly good, bowels moving regularly, no abdominal or pelvic discomfort now.  She does not have PAC.  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 14cm adnexal mass.     Endometrial cancer determined by uterine biopsy   09/16/2013 Initial Diagnosis Endometrial cancer determined by uterine biopsy, and endocervical biopsy  Patient had been in usual good health until acute episode of abdominal pain in Dec 2014, which eventually resolved without medical attention. More recently she has had vague diffuse abdominal discomfort, low grade nausea and abdominal bloating. She was seen in June 2015 for first gyn exam in years, PAP with AGUS and follow up biopsies of endocervix and endometrium by Dr Pamala Hurry both with serous endometrial cancer (pathology from [facility] 09-12-13, case # [id number] to be scanned into this EMR). Biopsy of cervix had normal squamous epithelium. CA125 from [facility] 09-16-13 was 964. She was seen by Dr Denman George on 09-23-13, her exam remarkable for large pelvic mass minimally mobile and extending to umbilicus. She had CT CAP 09-26-13 had prominent but not enlarged juxtapericardiac lymph nodes, trace right pleural effusion, large amount  of abnormal soft tissue thruout peritoneal cavity, predominately with omentum and especially LUQ, largest area 3.8 x 3.0 x 4.0 cm anterior to proximal descending colon, implants adjacent to liver, large complex multicystic lesion in pelvis inseperable from uterus and adnexae, with mass effect on bladder, questionable area in central aspect of dome of liver, borderline enlarged retroperitoneal nodes. Due to extent of disease on CT, Dr Denman George has recommended that treatment begin with chemotherapy, with consideration of interval debulking surgery depending on response to systemic treatment. Cycle one taxol carboplatin was given on 2-53-66, complicated by severe nausea/vomiting, constipation and severe taxol aches. Cycle 2 was changed to dose dense carbo taxol, day 1 on 11-01-13.  Review of systems as above, also: No fever. No HA. Not clearly vertigo. No SOB or cardiac symptoms. No other neurologic symptoms including no significant peripheral neuropathy.Has been drinking fluids. She has not been taking any medications for environmental allergies. No bleeding. Bladder ok. Remainder of 10 point Review of Systems negative.  Objective:  Vital signs in last 24 hours:  BP 117/93  Pulse 110  Temp(Src) 98.2 F (36.8 C) (Oral)  Resp 19  Ht 5\' 4"  (1.626 m)  Wt 214 lb 1.6 oz (97.115 kg)  BMI 36.73 kg/m2 weight stable.  Alert, oriented and appropriate. Ambulatory without assistance, noticed slightly unsteady with standing quickly then gail not remarkable. Alopecia  HEENT:PERRL, sclerae not icteric. Oral mucosa moist without lesions, posterior pharynx clear.  Neck supple. No JVD. Minimal cerumen, TMs clear bilaterally Lymphatics:no cervical,suraclavicular or inguinal adenopathy Resp: clear to auscultation bilaterally and normal percussion bilaterally Cardio: regular rate and rhythm. No gallop. GI: soft, nontender,  much less distended than on presentation, no mass or organomegaly. Normally active bowel  sounds.  Musculoskeletal/ Extremities: without pitting edema, cords, tenderness Neuro: no peripheral neuropathy. Speech fluent and appropriate, CN intact, no nystagmus. Otherwise nonfocal Skin without rash, ecchymosis, petechiae. Apparent SQ sebaceous cyst mid back slightly tender, smooth, 1.5 x 2.5 cm, no erythema or heat.    Lab Results:  Results for orders placed in visit on 11/29/13  CBC WITH DIFFERENTIAL      Result Value Ref Range   WBC 2.5 (*) 3.9 - 10.3 10e3/uL   NEUT# 1.5  1.5 - 6.5 10e3/uL   HGB 9.7 (*) 11.6 - 15.9 g/dL   HCT 29.7 (*) 34.8 - 46.6 %   Platelets 248  145 - 400 10e3/uL   MCV 77.7 (*) 79.5 - 101.0 fL   MCH 25.4  25.1 - 34.0 pg   MCHC 32.7  31.5 - 36.0 g/dL   RBC 3.82  3.70 - 5.45 10e6/uL   RDW 20.9 (*) 11.2 - 14.5 %   lymph# 0.9  0.9 - 3.3 10e3/uL   MONO# 0.1  0.1 - 0.9 10e3/uL   Eosinophils Absolute 0.0  0.0 - 0.5 10e3/uL   Basophils Absolute 0.0  0.0 - 0.1 10e3/uL   NEUT% 62.0  38.4 - 76.8 %   LYMPH% 35.6  14.0 - 49.7 %   MONO% 2.4  0.0 - 14.0 %   EOS% 0.0  0.0 - 7.0 %   BASO% 0.0  0.0 - 2.0 %  COMPREHENSIVE METABOLIC PANEL (BM84)      Result Value Ref Range   Sodium 134 (*) 136 - 145 mEq/L   Potassium 3.9  3.5 - 5.1 mEq/L   Chloride 101  98 - 109 mEq/L   CO2 21 (*) 22 - 29 mEq/L   Glucose 123  70 - 140 mg/dl   BUN 26.6 (*) 7.0 - 26.0 mg/dL   Creatinine 1.0  0.6 - 1.1 mg/dL   Total Bilirubin 0.35  0.20 - 1.20 mg/dL   Alkaline Phosphatase 75  40 - 150 U/L   AST 17  5 - 34 U/L   ALT 15  0 - 55 U/L   Total Protein 7.9  6.4 - 8.3 g/dL   Albumin 3.7  3.5 - 5.0 g/dL   Calcium 9.9  8.4 - 10.4 mg/dL   Anion Gap 11  3 - 11 mEq/L     Studies/Results:  No results found.  Medications: I have reviewed the patient's current medications. She should start daily Claritin or equivalent. WIll add granix day after each chemo.  DISCUSSION: I have spoken directly with gyn oncology, as Dr Denman George had wanted patient seen around cycle 3 or 4; will need CT prior to  that visit. Warm soaks to the sebaceous cyst and family to look at this daily to be sure not red or hot  Add gCSF.   Assessment/Plan: 1. Clinical stage IV serous endometrial carcinoma:Tolerating dose dense regimen much better than q 3 week dosing, clinically improving. Add gCSF day after each chemo, begin 8-29.  Follow up with gyn oncology requested, and will set up CT just prior.  Possible interval debulking surgery depending on response.  2. Slight unsteadiness: push po fluids, regular claritin or equivalent. Follow. 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 HTN: Resumed lisinopril HCTZ , follow BP  7.constipation: controlled with present laxatives.  8.mild hypokalemia: improved on KCl 10 mEq  daily with the HCTZ, follow  9. Anemia: mutlifactorial, post transfusion 2 units PRBCs 7-17 and continuing oral iron. Will change iron preparation when next refill needed. Follow. 10.reported small volume hematemesis after several episodes of vomiting day 2-3 of cycle 1. None since and epigastric discomfort much better on protonix and carafate which she will continue. GERD preceded chemotherapy.     All questions answered. Chemo and gCSF orders entered/ confirmed. Time spent 25 min including >50% counseling and coordination of care.   LIVESAY,LENNIS P, MD   11/29/2013, 1:10 PM

## 2013-11-30 ENCOUNTER — Other Ambulatory Visit: Payer: Self-pay | Admitting: Oncology

## 2013-11-30 ENCOUNTER — Ambulatory Visit (HOSPITAL_BASED_OUTPATIENT_CLINIC_OR_DEPARTMENT_OTHER): Payer: BC Managed Care – PPO

## 2013-11-30 VITALS — BP 118/79 | HR 91 | Temp 97.5°F | Resp 18

## 2013-11-30 DIAGNOSIS — Z5189 Encounter for other specified aftercare: Secondary | ICD-10-CM

## 2013-11-30 DIAGNOSIS — C541 Malignant neoplasm of endometrium: Secondary | ICD-10-CM

## 2013-11-30 DIAGNOSIS — C549 Malignant neoplasm of corpus uteri, unspecified: Secondary | ICD-10-CM

## 2013-11-30 MED ORDER — TBO-FILGRASTIM 300 MCG/0.5ML ~~LOC~~ SOSY
300.0000 ug | PREFILLED_SYRINGE | Freq: Once | SUBCUTANEOUS | Status: AC
  Administered 2013-11-30: 300 ug via SUBCUTANEOUS

## 2013-12-02 ENCOUNTER — Telehealth: Payer: Self-pay | Admitting: Oncology

## 2013-12-02 NOTE — Telephone Encounter (Signed)
per pof CT sch-Ct [place] made/Dr Denman George sch made cld pt & spoke to pt about time & date-pt understood

## 2013-12-04 ENCOUNTER — Other Ambulatory Visit (HOSPITAL_BASED_OUTPATIENT_CLINIC_OR_DEPARTMENT_OTHER): Payer: BC Managed Care – PPO

## 2013-12-04 ENCOUNTER — Ambulatory Visit (HOSPITAL_BASED_OUTPATIENT_CLINIC_OR_DEPARTMENT_OTHER): Payer: BC Managed Care – PPO | Admitting: Oncology

## 2013-12-04 ENCOUNTER — Encounter: Payer: Self-pay | Admitting: Oncology

## 2013-12-04 VITALS — BP 126/88 | HR 84 | Temp 98.2°F | Resp 18 | Ht 64.0 in | Wt 221.9 lb

## 2013-12-04 DIAGNOSIS — C549 Malignant neoplasm of corpus uteri, unspecified: Secondary | ICD-10-CM

## 2013-12-04 DIAGNOSIS — F17200 Nicotine dependence, unspecified, uncomplicated: Secondary | ICD-10-CM

## 2013-12-04 DIAGNOSIS — C541 Malignant neoplasm of endometrium: Secondary | ICD-10-CM

## 2013-12-04 DIAGNOSIS — R42 Dizziness and giddiness: Secondary | ICD-10-CM

## 2013-12-04 LAB — COMPREHENSIVE METABOLIC PANEL (CC13)
ALK PHOS: 79 U/L (ref 40–150)
ALT: 15 U/L (ref 0–55)
AST: 15 U/L (ref 5–34)
Albumin: 3.3 g/dL — ABNORMAL LOW (ref 3.5–5.0)
Anion Gap: 8 mEq/L (ref 3–11)
BUN: 18.8 mg/dL (ref 7.0–26.0)
CO2: 26 mEq/L (ref 22–29)
CREATININE: 0.9 mg/dL (ref 0.6–1.1)
Calcium: 9 mg/dL (ref 8.4–10.4)
Chloride: 105 mEq/L (ref 98–109)
GLUCOSE: 96 mg/dL (ref 70–140)
Potassium: 3.4 mEq/L — ABNORMAL LOW (ref 3.5–5.1)
Sodium: 139 mEq/L (ref 136–145)
Total Protein: 6.9 g/dL (ref 6.4–8.3)

## 2013-12-04 LAB — CBC WITH DIFFERENTIAL/PLATELET
BASO%: 0.1 % (ref 0.0–2.0)
BASOS ABS: 0 10*3/uL (ref 0.0–0.1)
EOS%: 0 % (ref 0.0–7.0)
Eosinophils Absolute: 0 10*3/uL (ref 0.0–0.5)
HCT: 26.2 % — ABNORMAL LOW (ref 34.8–46.6)
HEMOGLOBIN: 8.4 g/dL — AB (ref 11.6–15.9)
LYMPH%: 46.7 % (ref 14.0–49.7)
MCH: 25.3 pg (ref 25.1–34.0)
MCHC: 32 g/dL (ref 31.5–36.0)
MCV: 79.1 fL — AB (ref 79.5–101.0)
MONO#: 0.8 10*3/uL (ref 0.1–0.9)
MONO%: 12.8 % (ref 0.0–14.0)
NEUT#: 2.6 10*3/uL (ref 1.5–6.5)
NEUT%: 40.4 % (ref 38.4–76.8)
Platelets: 204 10*3/uL (ref 145–400)
RBC: 3.31 10*6/uL — AB (ref 3.70–5.45)
RDW: 23.3 % — AB (ref 11.2–14.5)
WBC: 6.5 10*3/uL (ref 3.9–10.3)
lymph#: 3 10*3/uL (ref 0.9–3.3)

## 2013-12-04 NOTE — Progress Notes (Signed)
OFFICE PROGRESS NOTE   12/04/2013   Physicians:Emma Nada Maclachlan, MD , Aloha Gell   INTERVAL HISTORY:  Patient is seen, alone for visit, in continuing attention to chemotherapy being used as initial intervention for advanced gyn carcinoma, clinically responding well. She is receiving third cycle of carboplatin + taxol now, first cycle given as full dose regimen, then changed to dose dense with cycle 2. Dr Denman George had requested to see her back around cycle 3 or 4, set up with gyn oncology now for 12-16-13 with repeat CT AP shortly before that visit.  Has begun gCSF day after each chemo starting 11-30-13.  Patient is feeling very well other than a little unsteady at times "woozy in head", which seems somewhat positional and not clearly orthostatic, suggests vertigo. She denies HA or focal neurologic deficits otherwise; she does not have history of inner ear problems. She tried meclizine last pm and this AM, feels it has helped somewhat. She is not checking BP at home, now back on lisinopril HCTZ, which was held earlier in course for low BP. Appetite is good without nausea or vomiting. Bowels are moving well. She has no discomfort or noted distension abdomen or pelvis. Minimal neuropathy tips of fingers. No significant aching after granix injection  She does not have PAC.   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 14cm adnexal mass.     Endometrial cancer determined by uterine biopsy   09/16/2013 Initial Diagnosis Endometrial cancer determined by uterine biopsy, and endocervical biopsy  Patient had been in usual good health until acute episode of abdominal pain in Dec 2014, which eventually resolved without medical attention. More recently she has had vague diffuse abdominal discomfort, low grade nausea and abdominal bloating. She was seen in June 2015 for first gyn exam in years, PAP with AGUS and  follow up biopsies of endocervix and endometrium by Dr Pamala Hurry both with serous endometrial cancer (pathology from [facility] 09-12-13, case # [id number] to be scanned into this EMR). Biopsy of cervix had normal squamous epithelium. CA125 from [facility] 09-16-13 was 964. She was seen by Dr Denman George on 09-23-13, her exam remarkable for large pelvic mass minimally mobile and extending to umbilicus. She had CT CAP 09-26-13 had prominent but not enlarged juxtapericardiac lymph nodes, trace right pleural effusion, large amount of abnormal soft tissue thruout peritoneal cavity, predominately with omentum and especially LUQ, largest area 3.8 x 3.0 x 4.0 cm anterior to proximal descending colon, implants adjacent to liver, large complex multicystic lesion in pelvis inseperable from uterus and adnexae, with mass effect on bladder, questionable area in central aspect of dome of liver, borderline enlarged retroperitoneal nodes. Due to extent of disease on CT, Dr Denman George has recommended that treatment begin with chemotherapy, with consideration of interval debulking surgery depending on response to systemic treatment. Cycle one taxol carboplatin was given on 8-41-32, complicated by severe nausea/vomiting, constipation and severe taxol aches. Cycle 2 was changed to dose dense carbo taxol, day 1 on 11-01-13. She needed gCSF support by cycle 3.   Review of systems as above, also: No fever or symptoms of infection. No chest pain or other new or different pain. No bleeding. No LE swelling. Energy would be good other than the slight dizziness, tho still up and active at home including sweeping porch. Remainder of 10 point Review of Systems negative.  Objective:  Vital signs in last 24 hours:  BP 126/88  Pulse 84  Temp(Src) 98.2 F (36.8 C) (Oral)  Resp 18  Ht 5\' 4"  (1.626 m)  Wt 221 lb 14.4 oz (100.653 kg)  BMI 38.07 kg/m2 Orthostatic vitals done by RN after MD exam show no orthostatic change - documented in EMR  vitals. Weight up 6 lbs Alert, oriented and appropriate, does not appear in any distress.  Ambulatory a little slowly, without assistance. Able to get on and off exam table with minimal assistance. Respirations not labored RA.  HEENT:PERRL, sclerae not icteric. Oral mucosa moist without lesions, posterior pharynx clear.  Neck supple. No JVD. TMs not remarkable. Lymphatics:no supraclavicular adenopathy Resp: clear to auscultation bilaterally and normal percussion bilaterally Cardio: regular rate and rhythm. No gallop. No murmur appreciated. GI: abdomen obese, soft, nontender, not distended, no clear mass or organomegaly. Normally active bowel sounds.  Musculoskeletal/ Extremities: without pitting edema, cords, tenderness Neuro: Speech fluent and appropriate, CN intact, no nystagmus, strength symmetrical. PSYCH appropriate mood and affect Skin without rash, ecchymosis, petechiae   Lab Results:  Results for orders placed in visit on 12/04/13  CBC WITH DIFFERENTIAL      Result Value Ref Range   WBC 6.5  3.9 - 10.3 10e3/uL   NEUT# 2.6  1.5 - 6.5 10e3/uL   HGB 8.4 (*) 11.6 - 15.9 g/dL   HCT 26.2 (*) 34.8 - 46.6 %   Platelets 204  145 - 400 10e3/uL   MCV 79.1 (*) 79.5 - 101.0 fL   MCH 25.3  25.1 - 34.0 pg   MCHC 32.0  31.5 - 36.0 g/dL   RBC 3.31 (*) 3.70 - 5.45 10e6/uL   RDW 23.3 (*) 11.2 - 14.5 %   lymph# 3.0  0.9 - 3.3 10e3/uL   MONO# 0.8  0.1 - 0.9 10e3/uL   Eosinophils Absolute 0.0  0.0 - 0.5 10e3/uL   Basophils Absolute 0.0  0.0 - 0.1 10e3/uL   NEUT% 40.4  38.4 - 76.8 %   LYMPH% 46.7  14.0 - 49.7 %   MONO% 12.8  0.0 - 14.0 %   EOS% 0.0  0.0 - 7.0 %   BASO% 0.1  0.0 - 2.0 %  COMPREHENSIVE METABOLIC PANEL (BO17)      Result Value Ref Range   Sodium 139  136 - 145 mEq/L   Potassium 3.4 (*) 3.5 - 5.1 mEq/L   Chloride 105  98 - 109 mEq/L   CO2 26  22 - 29 mEq/L   Glucose 96  70 - 140 mg/dl   BUN 18.8  7.0 - 26.0 mg/dL   Creatinine 0.9  0.6 - 1.1 mg/dL   Total Bilirubin <0.20   0.20 - 1.20 mg/dL   Alkaline Phosphatase 79  40 - 150 U/L   AST 15  5 - 34 U/L   ALT 15  0 - 55 U/L   Total Protein 6.9  6.4 - 8.3 g/dL   Albumin 3.3 (*) 3.5 - 5.0 g/dL   Calcium 9.0  8.4 - 10.4 mg/dL   Anion Gap 8  3 - 11 mEq/L    CA 125 available after visit, by previous lab method now 76 (and by new lab method now 101), having been 221 on 11-15-13 and 1283 in early July  Studies/Results:  No results found.  Medications: I have reviewed the patient's current medications with her, all bottles brought to visit as requested. She can use the meclizine 25 mg every 6 hrs if helpful. She will stop lisinopril HCTZ for now. Continue  carafate/ not using protonix now. She has not picked up hemocyte yet, but understands that she needs to begin.  DISCUSSION: if vertigo continues would get CT head and may need neurorehab PT. If hemoglobin decreases further or more symptomatic, would given PRBCs.  Assessment/Plan: 1. Clinical stage IV serous endometrial carcinoma:Tolerating dose dense regimen much better than q 3 week dosing, clinically improving. Using gCSF day after each chemo, begun 8-29. Follow up CT and reevaluation by gyn oncology upcoming, continue chemo on schedule unless other recommendations then. 2. Slight unsteadiness: may be inner ear, or other. Plan as above and patient aware to call if symptoms worsen or do not improve. 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 HTN: Resumed lisinopril HCTZ , follow BP  7.constipation: controlled with present laxatives.  8.mild hypokalemia: improved on KCl 10 mEq daily. Now holding HCTZ 9. Anemia: mutlifactorial, post transfusion 2 units PRBCs 7-17 and continuing oral iron. Will change iron preparation when next refill needed. Follow.  10.reported small volume hematemesis after several episodes of vomiting day 2-3 of cycle 1. None since and epigastric discomfort much  better on protonix initially and now carafate, which she will continue. GERD preceded chemotherapy.     Patient understood discussion and plan, and is in agreement. Chemo orders confirmed.    Shun Pletz P, MD   12/04/2013, 12:56 PM

## 2013-12-04 NOTE — Patient Instructions (Signed)
So not take blood pressure medicine lisinopril/HCTZ  You can take meclizine 25 mg = one tablet every 6 hours if helpful for vertigo  Let Dr Marko Plume know if you are short of breath with activity, as you may need blood transfusion for anemia.

## 2013-12-05 ENCOUNTER — Telehealth: Payer: Self-pay | Admitting: *Deleted

## 2013-12-05 LAB — CA 125(PREVIOUS METHOD): CA 125: 76.4 U/mL — AB (ref 0.0–30.2)

## 2013-12-05 LAB — CA 125: CA 125: 101 U/mL — AB (ref <35)

## 2013-12-05 NOTE — Telephone Encounter (Signed)
Called patient and let her know results as noted below by Dr. Marko Plume. Patient very appreciative of the call.

## 2013-12-05 NOTE — Telephone Encounter (Signed)
Message copied by Christa See on Thu Dec 05, 2013  1:25 PM ------      Message from: Gordy Levan      Created: Thu Dec 05, 2013  8:23 AM       Labs seen and need follow up: please let her know the ca125 marker was better again yesterday, down to 76 by the lab method that we have used for testing so far, and a little different but still lower at 101 by the new lab method that we are switching to - but she is continuing to improve by the ca125 marker also ------

## 2013-12-06 ENCOUNTER — Ambulatory Visit (HOSPITAL_BASED_OUTPATIENT_CLINIC_OR_DEPARTMENT_OTHER): Payer: BC Managed Care – PPO

## 2013-12-06 ENCOUNTER — Other Ambulatory Visit: Payer: Self-pay

## 2013-12-06 ENCOUNTER — Other Ambulatory Visit (HOSPITAL_BASED_OUTPATIENT_CLINIC_OR_DEPARTMENT_OTHER): Payer: BC Managed Care – PPO

## 2013-12-06 ENCOUNTER — Telehealth: Payer: Self-pay

## 2013-12-06 VITALS — BP 131/75 | HR 97 | Temp 98.3°F | Resp 16

## 2013-12-06 DIAGNOSIS — C541 Malignant neoplasm of endometrium: Secondary | ICD-10-CM

## 2013-12-06 DIAGNOSIS — Z5111 Encounter for antineoplastic chemotherapy: Secondary | ICD-10-CM

## 2013-12-06 DIAGNOSIS — C549 Malignant neoplasm of corpus uteri, unspecified: Secondary | ICD-10-CM

## 2013-12-06 LAB — CBC WITH DIFFERENTIAL/PLATELET
BASO%: 0 % (ref 0.0–2.0)
BASOS ABS: 0 10*3/uL (ref 0.0–0.1)
EOS ABS: 0 10*3/uL (ref 0.0–0.5)
EOS%: 0 % (ref 0.0–7.0)
HEMATOCRIT: 26.3 % — AB (ref 34.8–46.6)
HEMOGLOBIN: 8.7 g/dL — AB (ref 11.6–15.9)
LYMPH#: 2.1 10*3/uL (ref 0.9–3.3)
LYMPH%: 23.5 % (ref 14.0–49.7)
MCH: 25.5 pg (ref 25.1–34.0)
MCHC: 33.1 g/dL (ref 31.5–36.0)
MCV: 77.1 fL — ABNORMAL LOW (ref 79.5–101.0)
MONO#: 0.6 10*3/uL (ref 0.1–0.9)
MONO%: 6.8 % (ref 0.0–14.0)
NEUT%: 69.7 % (ref 38.4–76.8)
NEUTROS ABS: 6.3 10*3/uL (ref 1.5–6.5)
Platelets: 171 10*3/uL (ref 145–400)
RBC: 3.41 10*6/uL — ABNORMAL LOW (ref 3.70–5.45)
RDW: 21.6 % — AB (ref 11.2–14.5)
WBC: 9 10*3/uL (ref 3.9–10.3)
nRBC: 1 % — ABNORMAL HIGH (ref 0–0)

## 2013-12-06 LAB — COMPREHENSIVE METABOLIC PANEL (CC13)
ALBUMIN: 3.4 g/dL — AB (ref 3.5–5.0)
ALK PHOS: 78 U/L (ref 40–150)
ALT: 16 U/L (ref 0–55)
AST: 19 U/L (ref 5–34)
Anion Gap: 9 mEq/L (ref 3–11)
BUN: 22.1 mg/dL (ref 7.0–26.0)
CHLORIDE: 106 meq/L (ref 98–109)
CO2: 22 mEq/L (ref 22–29)
Calcium: 9.6 mg/dL (ref 8.4–10.4)
Creatinine: 0.9 mg/dL (ref 0.6–1.1)
GLUCOSE: 116 mg/dL (ref 70–140)
Potassium: 3.8 mEq/L (ref 3.5–5.1)
SODIUM: 137 meq/L (ref 136–145)
TOTAL PROTEIN: 7.4 g/dL (ref 6.4–8.3)
Total Bilirubin: <0.2 mg/dL (ref 0.20–1.20)

## 2013-12-06 MED ORDER — DEXAMETHASONE SODIUM PHOSPHATE 20 MG/5ML IJ SOLN
INTRAMUSCULAR | Status: AC
  Filled 2013-12-06: qty 5

## 2013-12-06 MED ORDER — ONDANSETRON 8 MG/NS 50 ML IVPB
INTRAVENOUS | Status: AC
  Filled 2013-12-06: qty 8

## 2013-12-06 MED ORDER — DEXAMETHASONE SODIUM PHOSPHATE 20 MG/5ML IJ SOLN
20.0000 mg | Freq: Once | INTRAMUSCULAR | Status: AC
  Administered 2013-12-06: 20 mg via INTRAVENOUS

## 2013-12-06 MED ORDER — FAMOTIDINE IN NACL 20-0.9 MG/50ML-% IV SOLN
20.0000 mg | Freq: Once | INTRAVENOUS | Status: AC
  Administered 2013-12-06: 20 mg via INTRAVENOUS

## 2013-12-06 MED ORDER — DIPHENHYDRAMINE HCL 50 MG/ML IJ SOLN
50.0000 mg | Freq: Once | INTRAMUSCULAR | Status: AC
  Administered 2013-12-06: 50 mg via INTRAVENOUS

## 2013-12-06 MED ORDER — ONDANSETRON 8 MG/50ML IVPB (CHCC)
8.0000 mg | Freq: Once | INTRAVENOUS | Status: AC
  Administered 2013-12-06: 8 mg via INTRAVENOUS

## 2013-12-06 MED ORDER — SODIUM CHLORIDE 0.9 % IV SOLN
Freq: Once | INTRAVENOUS | Status: AC
  Administered 2013-12-06: 12:00:00 via INTRAVENOUS

## 2013-12-06 MED ORDER — DEXTROSE 5 % IV SOLN
80.0000 mg/m2 | Freq: Once | INTRAVENOUS | Status: AC
  Administered 2013-12-06: 168 mg via INTRAVENOUS
  Filled 2013-12-06: qty 28

## 2013-12-06 MED ORDER — DIPHENHYDRAMINE HCL 50 MG/ML IJ SOLN
INTRAMUSCULAR | Status: AC
  Filled 2013-12-06: qty 1

## 2013-12-06 MED ORDER — FAMOTIDINE IN NACL 20-0.9 MG/50ML-% IV SOLN
INTRAVENOUS | Status: AC
  Filled 2013-12-06: qty 50

## 2013-12-06 NOTE — Telephone Encounter (Addendum)
Spoke with [name] in the infusion room while receiving her chemotherapy today.  She states that the dizziness is 85% gone since using the Antivert 25 mg q 6 hrs and holding her Lisinopril-HCTZ as directed by Dr. Marko Plume at visit 12-04-13.  Told [name] that Dr. Marko Plume wants to get a CT of her head in addition to the CT A/P on 12-12-13 since her symptoms have not completely resolved.  Patient verbalized understanding.             Patient Demographics     Patient Name Sex DOB SSN Address Phone    Melissa Ball, Melissa Ball Female [birth year] [id number] [place] [address] [phone] (Home)              Message Received: 1 day ago     Gordy Levan, MD Baruch Merl, RN; Christa See, RN            For chemo on 9-4.  RN please let MD know that day if unsteadiness is still a problem, or if meclizine + DC BP med resolved it. If still present, needs CT head with contrast; if that is ok, will need referral to neurorehab PT   thanks

## 2013-12-06 NOTE — Patient Instructions (Signed)
[facility] Discharge Instructions for Patients Receiving Chemotherapy  Today you received the following chemotherapy agents: taxol  To help prevent nausea and vomiting after your treatment, we encourage you to take your nausea medication as prescribed.    If you develop nausea and vomiting that is not controlled by your nausea medication, call the clinic.   BELOW ARE SYMPTOMS THAT SHOULD BE REPORTED IMMEDIATELY:  *FEVER GREATER THAN 100.5 F  *CHILLS WITH OR WITHOUT FEVER  NAUSEA AND VOMITING THAT IS NOT CONTROLLED WITH YOUR NAUSEA MEDICATION  *UNUSUAL SHORTNESS OF BREATH  *UNUSUAL BRUISING OR BLEEDING  TENDERNESS IN MOUTH AND THROAT WITH OR WITHOUT PRESENCE OF ULCERS  *URINARY PROBLEMS  *BOWEL PROBLEMS  UNUSUAL RASH Items with * indicate a potential emergency and should be followed up as soon as possible.  Feel free to call the clinic you have any questions or concerns. The clinic phone number is [phone].    

## 2013-12-07 ENCOUNTER — Ambulatory Visit (HOSPITAL_BASED_OUTPATIENT_CLINIC_OR_DEPARTMENT_OTHER): Payer: BC Managed Care – PPO

## 2013-12-07 VITALS — BP 146/86 | HR 121 | Temp 98.0°F

## 2013-12-07 DIAGNOSIS — C549 Malignant neoplasm of corpus uteri, unspecified: Secondary | ICD-10-CM

## 2013-12-07 DIAGNOSIS — Z5189 Encounter for other specified aftercare: Secondary | ICD-10-CM

## 2013-12-07 DIAGNOSIS — C541 Malignant neoplasm of endometrium: Secondary | ICD-10-CM

## 2013-12-07 MED ORDER — TBO-FILGRASTIM 300 MCG/0.5ML ~~LOC~~ SOSY
300.0000 ug | PREFILLED_SYRINGE | Freq: Once | SUBCUTANEOUS | Status: AC
  Administered 2013-12-07: 300 ug via SUBCUTANEOUS

## 2013-12-07 NOTE — Patient Instructions (Signed)

## 2013-12-09 ENCOUNTER — Other Ambulatory Visit: Payer: Self-pay | Admitting: Oncology

## 2013-12-12 ENCOUNTER — Ambulatory Visit (HOSPITAL_COMMUNITY)
Admission: RE | Admit: 2013-12-12 | Discharge: 2013-12-12 | Disposition: A | Discharge status: Home / Self Care | Payer: BC Managed Care – PPO | Source: Ambulatory Visit | Attending: Oncology | Admitting: Oncology

## 2013-12-12 ENCOUNTER — Other Ambulatory Visit: Payer: Self-pay | Admitting: Oncology

## 2013-12-12 ENCOUNTER — Encounter (HOSPITAL_COMMUNITY): Payer: Self-pay

## 2013-12-12 DIAGNOSIS — I700 Atherosclerosis of aorta: Secondary | ICD-10-CM | POA: Insufficient documentation

## 2013-12-12 DIAGNOSIS — C549 Malignant neoplasm of corpus uteri, unspecified: Secondary | ICD-10-CM | POA: Diagnosis not present

## 2013-12-12 DIAGNOSIS — K449 Diaphragmatic hernia without obstruction or gangrene: Secondary | ICD-10-CM | POA: Diagnosis not present

## 2013-12-12 DIAGNOSIS — C541 Malignant neoplasm of endometrium: Secondary | ICD-10-CM

## 2013-12-12 DIAGNOSIS — E278 Other specified disorders of adrenal gland: Secondary | ICD-10-CM | POA: Diagnosis not present

## 2013-12-12 DIAGNOSIS — K7689 Other specified diseases of liver: Secondary | ICD-10-CM | POA: Diagnosis not present

## 2013-12-12 DIAGNOSIS — Z79899 Other long term (current) drug therapy: Secondary | ICD-10-CM | POA: Diagnosis not present

## 2013-12-12 DIAGNOSIS — R1900 Intra-abdominal and pelvic swelling, mass and lump, unspecified site: Secondary | ICD-10-CM | POA: Diagnosis not present

## 2013-12-12 DIAGNOSIS — M5137 Other intervertebral disc degeneration, lumbosacral region: Secondary | ICD-10-CM | POA: Insufficient documentation

## 2013-12-12 DIAGNOSIS — M51379 Other intervertebral disc degeneration, lumbosacral region without mention of lumbar back pain or lower extremity pain: Secondary | ICD-10-CM | POA: Insufficient documentation

## 2013-12-12 MED ORDER — IOHEXOL 300 MG/ML  SOLN
100.0000 mL | Freq: Once | INTRAMUSCULAR | Status: AC | PRN
  Administered 2013-12-12: 100 mL via INTRAVENOUS

## 2013-12-13 ENCOUNTER — Other Ambulatory Visit: Payer: BC Managed Care – PPO

## 2013-12-13 ENCOUNTER — Other Ambulatory Visit (HOSPITAL_BASED_OUTPATIENT_CLINIC_OR_DEPARTMENT_OTHER): Payer: BC Managed Care – PPO

## 2013-12-13 ENCOUNTER — Ambulatory Visit (HOSPITAL_BASED_OUTPATIENT_CLINIC_OR_DEPARTMENT_OTHER): Payer: BC Managed Care – PPO

## 2013-12-13 ENCOUNTER — Encounter: Payer: Self-pay | Admitting: Oncology

## 2013-12-13 ENCOUNTER — Ambulatory Visit (HOSPITAL_BASED_OUTPATIENT_CLINIC_OR_DEPARTMENT_OTHER): Payer: BC Managed Care – PPO | Admitting: Oncology

## 2013-12-13 VITALS — BP 131/88 | HR 122 | Temp 97.6°F | Resp 20 | Ht 64.0 in | Wt 227.3 lb

## 2013-12-13 DIAGNOSIS — Z5111 Encounter for antineoplastic chemotherapy: Secondary | ICD-10-CM

## 2013-12-13 DIAGNOSIS — C541 Malignant neoplasm of endometrium: Secondary | ICD-10-CM

## 2013-12-13 DIAGNOSIS — C549 Malignant neoplasm of corpus uteri, unspecified: Secondary | ICD-10-CM

## 2013-12-13 LAB — COMPREHENSIVE METABOLIC PANEL (CC13)
ALT: 12 U/L (ref 0–55)
ANION GAP: 11 meq/L (ref 3–11)
AST: 18 U/L (ref 5–34)
Albumin: 3.4 g/dL — ABNORMAL LOW (ref 3.5–5.0)
Alkaline Phosphatase: 70 U/L (ref 40–150)
BUN: 15.2 mg/dL (ref 7.0–26.0)
CO2: 21 meq/L — AB (ref 22–29)
CREATININE: 0.9 mg/dL (ref 0.6–1.1)
Calcium: 9.4 mg/dL (ref 8.4–10.4)
Chloride: 108 mEq/L (ref 98–109)
Glucose: 112 mg/dl (ref 70–140)
Potassium: 3.8 mEq/L (ref 3.5–5.1)
SODIUM: 140 meq/L (ref 136–145)
TOTAL PROTEIN: 7.3 g/dL (ref 6.4–8.3)
Total Bilirubin: 0.2 mg/dL (ref 0.20–1.20)

## 2013-12-13 LAB — CBC WITH DIFFERENTIAL/PLATELET
BASO%: 0.1 % (ref 0.0–2.0)
Basophils Absolute: 0 10*3/uL (ref 0.0–0.1)
EOS ABS: 0 10*3/uL (ref 0.0–0.5)
EOS%: 0 % (ref 0.0–7.0)
HEMATOCRIT: 25.6 % — AB (ref 34.8–46.6)
HGB: 8.2 g/dL — ABNORMAL LOW (ref 11.6–15.9)
LYMPH%: 23.2 % (ref 14.0–49.7)
MCH: 25.4 pg (ref 25.1–34.0)
MCHC: 32.1 g/dL (ref 31.5–36.0)
MCV: 79.3 fL — AB (ref 79.5–101.0)
MONO#: 0.1 10*3/uL (ref 0.1–0.9)
MONO%: 2.4 % (ref 0.0–14.0)
NEUT%: 74.3 % (ref 38.4–76.8)
NEUTROS ABS: 4 10*3/uL (ref 1.5–6.5)
PLATELETS: 128 10*3/uL — AB (ref 145–400)
RBC: 3.23 10*6/uL — ABNORMAL LOW (ref 3.70–5.45)
RDW: 24.5 % — ABNORMAL HIGH (ref 11.2–14.5)
WBC: 5.4 10*3/uL (ref 3.9–10.3)
lymph#: 1.2 10*3/uL (ref 0.9–3.3)

## 2013-12-13 MED ORDER — DIPHENHYDRAMINE HCL 50 MG/ML IJ SOLN
INTRAMUSCULAR | Status: AC
  Filled 2013-12-13: qty 1

## 2013-12-13 MED ORDER — ONDANSETRON 16 MG/50ML IVPB (CHCC)
16.0000 mg | Freq: Once | INTRAVENOUS | Status: AC
  Administered 2013-12-13: 16 mg via INTRAVENOUS

## 2013-12-13 MED ORDER — FAMOTIDINE IN NACL 20-0.9 MG/50ML-% IV SOLN
20.0000 mg | Freq: Once | INTRAVENOUS | Status: AC
  Administered 2013-12-13: 20 mg via INTRAVENOUS

## 2013-12-13 MED ORDER — ONDANSETRON 16 MG/50ML IVPB (CHCC)
INTRAVENOUS | Status: AC
  Filled 2013-12-13: qty 16

## 2013-12-13 MED ORDER — DIPHENHYDRAMINE HCL 50 MG/ML IJ SOLN
50.0000 mg | Freq: Once | INTRAMUSCULAR | Status: AC
  Administered 2013-12-13: 50 mg via INTRAVENOUS

## 2013-12-13 MED ORDER — DEXAMETHASONE SODIUM PHOSPHATE 20 MG/5ML IJ SOLN
INTRAMUSCULAR | Status: AC
  Filled 2013-12-13: qty 5

## 2013-12-13 MED ORDER — PACLITAXEL CHEMO INJECTION 300 MG/50ML
80.0000 mg/m2 | Freq: Once | INTRAVENOUS | Status: AC
  Administered 2013-12-13: 168 mg via INTRAVENOUS
  Filled 2013-12-13: qty 28

## 2013-12-13 MED ORDER — DEXAMETHASONE SODIUM PHOSPHATE 20 MG/5ML IJ SOLN
12.0000 mg | Freq: Once | INTRAMUSCULAR | Status: AC
  Administered 2013-12-13: 12 mg via INTRAVENOUS

## 2013-12-13 MED ORDER — CARBOPLATIN CHEMO INJECTION 600 MG/60ML
615.5000 mg | Freq: Once | INTRAVENOUS | Status: AC
  Administered 2013-12-13: 620 mg via INTRAVENOUS
  Filled 2013-12-13: qty 62

## 2013-12-13 MED ORDER — FAMOTIDINE IN NACL 20-0.9 MG/50ML-% IV SOLN
INTRAVENOUS | Status: AC
  Filled 2013-12-13: qty 50

## 2013-12-13 MED ORDER — SODIUM CHLORIDE 0.9 % IV SOLN
Freq: Once | INTRAVENOUS | Status: AC
  Administered 2013-12-13: 12:00:00 via INTRAVENOUS

## 2013-12-13 MED ORDER — SODIUM CHLORIDE 0.9 % IV SOLN
150.0000 mg | Freq: Once | INTRAVENOUS | Status: AC
  Administered 2013-12-13: 150 mg via INTRAVENOUS
  Filled 2013-12-13: qty 5

## 2013-12-13 NOTE — Patient Instructions (Signed)
You can use Claritin (generic lortadine) 10 mg once daily for allergies, or benadryl (diphenhydramine) 25 mg at bedtime for allergies. The Claritin will not make you sleepy, but the benadryl will make you sleepy  You do not need to take Vitamin D or E now if you need less pills  OK to start back on your blood pressure medicine every other day for now

## 2013-12-13 NOTE — Patient Instructions (Addendum)
[facility] Discharge Instructions for Patients Receiving Chemotherapy  Today you received the following chemotherapy agents Carboplatin,taxol To help prevent nausea and vomiting after your treatment, we encourage you to take your nausea medication as prescribed-Zofran and ativan.   If you develop nausea and vomiting that is not controlled by your nausea medication, call the clinic.   BELOW ARE SYMPTOMS THAT SHOULD BE REPORTED IMMEDIATELY:  *FEVER GREATER THAN 100.5 F  *CHILLS WITH OR WITHOUT FEVER  NAUSEA AND VOMITING THAT IS NOT CONTROLLED WITH YOUR NAUSEA MEDICATION  *UNUSUAL SHORTNESS OF BREATH  *UNUSUAL BRUISING OR BLEEDING  TENDERNESS IN MOUTH AND THROAT WITH OR WITHOUT PRESENCE OF ULCERS  *URINARY PROBLEMS  *BOWEL PROBLEMS  UNUSUAL RASH Items with * indicate a potential emergency and should be followed up as soon as possible.  Feel free to call the clinic you have any questions or concerns. The clinic phone number is [phone].

## 2013-12-13 NOTE — Progress Notes (Signed)
OFFICE PROGRESS NOTE   12/13/2013   Physicians:Emma Nada Maclachlan, MD , Aloha Gell   INTERVAL HISTORY:  Patient is seen, alone for visit, as we continue dose dense carboplatin and taxol as initial intervention for advanced gyn cancer. She had CT AP on 12-12-13, with improvement in all areas of disease, and is to see Dr Denman George again on 12-16-13. Clinically she is tolerating treatment well other than progressive anemia.  Patient had unremarkable CT head also on 12-12-13, done for vertigo symptoms which had improved but not resolved with meclizine and holding lisinopril/HCTZ. The CT head showed nothing of concern; she has had more symptoms of environmental allergies this week, with clear rhinorrhea and some cough with clear sputum. She has not had allergy symptoms in past and is not using anything for this, tho could use claritin or benadryl prn.   She does not have much exercise tolerance, but is not SOB with limited activity. She has had no bleeding. She has not taken oral iron regularly but will do this. Appetite is good, no abdominal or pelvic distension or discomfort, bowels moving regularly. She has had some pedal edema since stopping lisinopril/HCTZ.   She does not have PAC. Peripheral IV access is not easy, however she is still reluctant to consider PAC. She has not had flu vaccine, which she needs on day that she is not receiving chemo She has not had genetics testing.  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 14cm adnexal mass.     Endometrial cancer determined by uterine biopsy   09/16/2013 Initial Diagnosis Endometrial cancer determined by uterine biopsy, and endocervical biopsy  Patient had been in usual good health until acute episode of abdominal pain in Dec 2014, which eventually resolved without medical attention. More recently she has had vague diffuse abdominal discomfort, low grade  nausea and abdominal bloating. She was seen in June 2015 for first gyn exam in years, PAP with AGUS and follow up biopsies of endocervix and endometrium by Dr Pamala Hurry both with serous endometrial cancer (pathology from [facility] 09-12-13, case # [id number] to be scanned into this EMR). Biopsy of cervix had normal squamous epithelium. CA125 from [facility] 09-16-13 was 964, and was up to 1406 by 10-23-13.Marland Kitchen She was seen by Dr Denman George on 09-23-13, her exam remarkable for large pelvic mass minimally mobile and extending to umbilicus. She had CT CAP 09-26-13 had prominent but not enlarged juxtapericardiac lymph nodes, trace right pleural effusion, large amount of abnormal soft tissue thruout peritoneal cavity, predominately with omentum and especially LUQ, largest area 3.8 x 3.0 x 4.0 cm anterior to proximal descending colon, implants adjacent to liver, large complex multicystic lesion in pelvis inseperable from uterus and adnexae, with mass effect on bladder, questionable area in central aspect of dome of liver, borderline enlarged retroperitoneal nodes. Due to extent of disease on CT, Dr Denman George has recommended that treatment begin with chemotherapy, with consideration of interval debulking surgery depending on response to systemic treatment. Cycle one taxol carboplatin was given on 2-77-82, complicated by severe nausea/vomiting, constipation and severe taxol aches. Cycle 2 was changed to dose dense carbo taxol, day 1 on 11-01-13. She needed gCSF support by cycle 3.   Review of systems as above, also: No fever or symptoms of infection. No LE pain. No nausea/ vomiting. No bothersome peripheral neuropathy Remainder of 10 point Review of Systems negative.  Objective:  Vital signs in last 24  hours:  BP 131/88  Pulse 122  Temp(Src) 97.6 F (36.4 C) (Oral)  Resp 20  Ht 5\' 4"  (1.626 m)  Wt 227 lb 4.8 oz (103.103 kg)  BMI 39.00 kg/m2 weight up 6 lbs  Alert, oriented and appropriate, very pleasant as  always, very talkative on steroids. Ambulatory without assistance, able to get on and off exam table.  Alopecia  HEENT:PERRL, sclerae not icteric. Oral mucosa moist without lesions, posterior pharynx clear.  Neck supple. No JVD.  Lymphatics:no cervical,suraclavicular  or inguinal adenopathy Resp: clear to auscultation bilaterally and normal percussion bilaterally Cardio: tachy, regular rate and rhythm. No gallop. GI: abdomen obese, soft, nontender, not obviously distended, no appreciable mass or organomegaly. Normally active bowel sounds.  Musculoskeletal/ Extremities: new 1+ pedal edema bilaterally without cords, tenderness Neuro: no peripheral neuropathy. Otherwise nonfocal. PSYCH appropriate mood and affect Skin without rash, ecchymosis, petechiae   Lab Results:  Results for orders placed in visit on 12/13/13  CBC WITH DIFFERENTIAL      Result Value Ref Range   WBC 5.4  3.9 - 10.3 10e3/uL   NEUT# 4.0  1.5 - 6.5 10e3/uL   HGB 8.2 (*) 11.6 - 15.9 g/dL   HCT 25.6 (*) 34.8 - 46.6 %   Platelets 128 (*) 145 - 400 10e3/uL   MCV 79.3 (*) 79.5 - 101.0 fL   MCH 25.4  25.1 - 34.0 pg   MCHC 32.1  31.5 - 36.0 g/dL   RBC 3.23 (*) 3.70 - 5.45 10e6/uL   RDW 24.5 (*) 11.2 - 14.5 %   lymph# 1.2  0.9 - 3.3 10e3/uL   MONO# 0.1  0.1 - 0.9 10e3/uL   Eosinophils Absolute 0.0  0.0 - 0.5 10e3/uL   Basophils Absolute 0.0  0.0 - 0.1 10e3/uL   NEUT% 74.3  38.4 - 76.8 %   LYMPH% 23.2  14.0 - 49.7 %   MONO% 2.4  0.0 - 14.0 %   EOS% 0.0  0.0 - 7.0 %   BASO% 0.1  0.0 - 2.0 %  COMPREHENSIVE METABOLIC PANEL (YC14)      Result Value Ref Range   Sodium 140  136 - 145 mEq/L   Potassium 3.8  3.5 - 5.1 mEq/L   Chloride 108  98 - 109 mEq/L   CO2 21 (*) 22 - 29 mEq/L   Glucose 112  70 - 140 mg/dl   BUN 15.2  7.0 - 26.0 mg/dL   Creatinine 0.9  0.6 - 1.1 mg/dL   Total Bilirubin 0.20  0.20 - 1.20 mg/dL   Alkaline Phosphatase 70  40 - 150 U/L   AST 18  5 - 34 U/L   ALT 12  0 - 55 U/L   Total Protein 7.3   6.4 - 8.3 g/dL   Albumin 3.4 (*) 3.5 - 5.0 g/dL   Calcium 9.4  8.4 - 10.4 mg/dL   Anion Gap 11  3 - 11 mEq/L    CA 125 from 12-04-13 down to 76.4 by previous lab method (101 by new lab method, which has given higher values in all patients); this was 1406 on 10-23-13  Studies/Results:  Ct Head W Wo Contrast  12/12/2013   CLINICAL DATA:  63 year old female with endometrial cancer and new onset dizziness. Subsequent encounter.  EXAM: CT HEAD WITHOUT AND WITH CONTRAST  TECHNIQUE: Contiguous axial images were obtained from the base of the skull through the vertex without and with intravenous contrast  CONTRAST:  170mL OMNIPAQUE IOHEXOL 300 MG/ML  SOLN in conjunction with contrast enhanced imaging of the abdomen and pelvis reported separately.  COMPARISON:  None.  FINDINGS: Mild hyperostosis of the calvarium. No acute or suspicious osseous lesion identified. Minor paranasal sinus mucosal thickening. Mastoids are clear. Visualized orbit soft tissues are within normal limits. Visualized scalp soft tissues are within normal limits.  Cerebral volume is normal. No midline shift, ventriculomegaly, mass effect, evidence of mass lesion, intracranial hemorrhage or evidence of cortically based acute infarction. Gray-white matter differentiation is within normal limits throughout the brain. No abnormal enhancement identified. Major intracranial vascular structures appear to be normally enhancing. No cerebellopontine angle mass is evident.  IMPRESSION: 1. Normal CT appearance of the brain. 2.  CT Abdomen and Pelvis reported separately.   Electronically Signed   By: Lars Pinks M.D.   On: 12/12/2013 15:02   Ct Abdomen Pelvis W Contrast  12/12/2013   CLINICAL DATA:  Endometrial cancer diagnosed in 2015. Evaluate for tumor size prior to surgery. Ongoing chemotherapy.  EXAM: CT ABDOMEN AND PELVIS WITH CONTRAST  TECHNIQUE: Multidetector CT imaging of the abdomen and pelvis was performed using the standard protocol following bolus  administration of intravenous contrast.  CONTRAST:  155mL OMNIPAQUE IOHEXOL 300 MG/ML  SOLN  COMPARISON:  09/26/2013  FINDINGS: Lower chest: Clear lung bases. Moderate hiatal hernia. Normal heart size without pericardial or pleural effusion. Resolution of right-sided pleural effusion. Right cardiophrenic angle nodes are slightly decreased in size, not pathologic by size criteria.  Hepatobiliary: Mild hepatic steatosis. The questioned hepatic dome lesion is no longer identified and may have been artifactual. Normal gallbladder, without biliary ductal dilatation.  Spleen: Normal  Pancreas: Normal, without mass or pancreatic ductal dilatation.  Stomach/Bowel: Normal distal stomach. Normal colon and terminal ileum. Normal small bowel caliber.  Urinary tract: Left adrenal thickening and nodularity is not significantly changed. A right adrenal nodule measures 2.2 x 1.3 cm. Felt to be similar to on the prior exam. This remains low in density, favor an adenoma.  Normal kidneys, without evidence of hydronephrosis or hydroureter. Normal urinary bladder.  Vascular/Lymphatic: Aortic and branch vessel atherosclerosis. Improvement to near resolution of retroperitoneal adenopathy. Index left periaortic node measures 1.0 cm today versus 1.5 cm on the prior. No pelvic adenopathy.  Reproductive: Improved appearance of the uterus and surrounding periuterine structures. The cystic mass centered in the high left hemipelvis the measures 9.5 x 9.6 cm on image 70. Compare 11.9 x 11.6 cm on the prior exam. The portion involving the uterus has resolved, with relative normal appearance of the lower uterine segment and cervix. There is decreased fluid versus posterior extension of cystic process into the cul-de-sac, including on image 79.  Musculoskeletal: Presumed bone island in the right iliac. Degenerative disc disease at multiple lumbar levels.  Other: Improved omental/ peritoneal disease with resolution of abdominal ascites. Left-sided  omental implant measures 1.8 x 2.2 cm on image 32 versus 3.8 x 3.0 cm on the prior.  IMPRESSION: 1. Response to therapy of pelvic primary, nodal, and omental/peritoneal metastasis. 2. Resolved right-sided pleural effusion and abdominal ascites. 3. No evidence of new or progressive disease. 4. 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   Electronically Signed   By: Abigail Miyamoto M.D.   On: 12/12/2013 13:25    Medications: I have reviewed the patient's current medications, all of which she brought with her to appointment. Iron encouraged. She can hold Vit D and Vit E if she wants fewer pills.  DISCUSSION: CT findings reviewed as above and I have  given written report of the CT AP for her son. She is aware that Dr Denman George may suggest interval debulking or may prefer continuing chemo for a bit longer rather than going to surgery now. I am glad to adjust chemo schedule if needed. If hemoglobin continues to drop and/or she is more symptomatic from the anemia, she will need PRBCs.  Assessment/Plan:   1. Clinical stage IV serous endometrial carcinoma:Tolerating dose dense regimen much better than q 3 week dosing, with gCSF day after each chemo. She is improving clinically, by repeat CT scan and by marker. She will see Dr Denman George on 12-16-13. Appointments in place for continuing chemo if recommended. 2. Slight unsteadiness: probably multifactorial including environmental allergies/ inner ear. Better, CT head not remarkable.  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 HTN: BP higher and pedal edema since antihypertensive held again. Resume lisinopril HCTZ every other day for now,  follow  7.constipation: controlled with present laxatives.  8.mild hypokalemia: improved on KCl 10 mEq daily.  9. Anemia: mutlifactorial, post transfusion 2 units PRBCs 7-17 and continuing oral iron. Somewhat symptomatic and may drop further with  carboplatin given today. Would give PRBCs if <=8.0 or more symptomatic 10.reported small volume hematemesis after several episodes of vomiting day 2-3 of cycle 1. None since and epigastric discomfort much better on protonix initially and now carafate, which she will continue. GERD preceded chemotherapy.    All questions answered, Chemo and gCSF orders confirmed. She will call prior to next scheduled visit if needed.   LIVESAY,LENNIS P, MD   12/13/2013, 11:45 AM

## 2013-12-14 ENCOUNTER — Other Ambulatory Visit: Payer: Self-pay | Admitting: Oncology

## 2013-12-14 ENCOUNTER — Ambulatory Visit (HOSPITAL_BASED_OUTPATIENT_CLINIC_OR_DEPARTMENT_OTHER): Payer: BC Managed Care – PPO

## 2013-12-14 ENCOUNTER — Ambulatory Visit: Payer: BC Managed Care – PPO

## 2013-12-14 VITALS — BP 115/73 | HR 107 | Temp 98.0°F | Resp 20

## 2013-12-14 DIAGNOSIS — C541 Malignant neoplasm of endometrium: Secondary | ICD-10-CM

## 2013-12-14 DIAGNOSIS — Z5189 Encounter for other specified aftercare: Secondary | ICD-10-CM

## 2013-12-14 DIAGNOSIS — C549 Malignant neoplasm of corpus uteri, unspecified: Secondary | ICD-10-CM

## 2013-12-14 MED ORDER — TBO-FILGRASTIM 300 MCG/0.5ML ~~LOC~~ SOSY
300.0000 ug | PREFILLED_SYRINGE | Freq: Once | SUBCUTANEOUS | Status: AC
  Administered 2013-12-14: 300 ug via SUBCUTANEOUS

## 2013-12-14 NOTE — Patient Instructions (Signed)

## 2013-12-16 ENCOUNTER — Telehealth: Payer: Self-pay | Admitting: Oncology

## 2013-12-16 ENCOUNTER — Ambulatory Visit: Payer: BC Managed Care – PPO | Attending: Gynecologic Oncology | Admitting: Gynecologic Oncology

## 2013-12-16 ENCOUNTER — Encounter: Payer: Self-pay | Admitting: Gynecologic Oncology

## 2013-12-16 VITALS — BP 109/69 | HR 90 | Temp 98.1°F | Resp 20 | Ht 64.0 in | Wt 223.4 lb

## 2013-12-16 DIAGNOSIS — N83209 Unspecified ovarian cyst, unspecified side: Secondary | ICD-10-CM | POA: Insufficient documentation

## 2013-12-16 DIAGNOSIS — Z90710 Acquired absence of both cervix and uterus: Secondary | ICD-10-CM | POA: Diagnosis not present

## 2013-12-16 DIAGNOSIS — R1900 Intra-abdominal and pelvic swelling, mass and lump, unspecified site: Secondary | ICD-10-CM | POA: Insufficient documentation

## 2013-12-16 DIAGNOSIS — F17200 Nicotine dependence, unspecified, uncomplicated: Secondary | ICD-10-CM | POA: Diagnosis not present

## 2013-12-16 DIAGNOSIS — Z79899 Other long term (current) drug therapy: Secondary | ICD-10-CM | POA: Diagnosis not present

## 2013-12-16 DIAGNOSIS — C549 Malignant neoplasm of corpus uteri, unspecified: Secondary | ICD-10-CM | POA: Diagnosis present

## 2013-12-16 DIAGNOSIS — C541 Malignant neoplasm of endometrium: Secondary | ICD-10-CM

## 2013-12-16 DIAGNOSIS — Z01818 Encounter for other preprocedural examination: Secondary | ICD-10-CM

## 2013-12-16 DIAGNOSIS — Z780 Asymptomatic menopausal state: Secondary | ICD-10-CM | POA: Insufficient documentation

## 2013-12-16 NOTE — Patient Instructions (Signed)
Preparing for your Surgery  Plan for surgery on October 20 with Dr. Denman George.  Pre-operative Testing -You will receive a phone call from presurgical testing at [facility] to arrange for a pre-operative testing appointment before your surgery.  This appointment normally occurs one to two weeks before your scheduled surgery.   -Bring your insurance card, copy of an advanced directive if applicable, medication list  -At that visit, you will be asked to sign a consent for a possible blood transfusion in case a transfusion becomes necessary during surgery.  The need for a blood transfusion is rare but having consent is a necessary part of your care.     -You should not be taking blood thinners or aspirin at least ten days prior to surgery unless instructed by your surgeon.  Day Before Surgery at [facility] will be asked to take in only clear liquids the day before surgery.  Examples of clear liquids include broths, jello, and clear juices.  You will be advised to have nothing to eat or drink after midnight the evening before.    Your role in recovery Your role is to become active as soon as directed by your doctor, while still giving yourself time to heal.  Rest when you feel tired. You will be asked to do the following in order to speed your recovery:  - Cough and breathe deeply. This helps toclear and expand your lungs and can prevent pneumonia. You may be given a spirometer to practice deep breathing. A staff member will show you how to use the spirometer. - Do mild physical activity. Walking or moving your legs help your circulation and body functions return to normal. A staff member will help you when you try to walk and will provide you with simple exercises. Do not try to get up or walk alone the first time. - Actively manage your pain. Managing your pain lets you move in comfort. We will ask you to rate your pain on a scale of zero to 10. It is your responsibility to tell  your doctor or nurse where and how much you hurt so your pain can be treated.  -You may experience clear drainage from your incisions and vaginal spotting after surgery.  Special Considerations -If you are diabetic, you may be placed on insulin after surgery to have closer control over your blood sugars to promote healing and recovery.  This does not mean that you will be discharged on insulin.  If applicable, your oral antidiabetics will be resumed when you are tolerating a solid diet.  -Your final pathology results from surgery should be available by the Friday after surgery and the results will be relayed to you when available.  Blood Transfusion Information  WHAT IS A BLOOD TRANSFUSION? A transfusion is the replacement of blood or some of its parts. Blood is made up of multiple cells which provide different functions.  Red blood cells carry oxygen and are used for blood loss replacement.  White blood cells fight against infection.  Platelets control bleeding.  Plasma helps clot blood.  Other blood products are available for specialized needs, such as hemophilia or other clotting disorders. BEFORE THE TRANSFUSION  Who gives blood for transfusions?   You may be able to donate blood to be used at a later date on yourself (autologous donation).  Relatives can be asked to donate blood. This is generally not any safer than if you have received blood from a stranger. The same precautions are taken  to ensure safety when a relative's blood is donated.  Healthy volunteers who are fully evaluated to make sure their blood is safe. This is blood bank blood. Transfusion therapy is the safest it has ever been in the practice of medicine. Before blood is taken from a donor, a complete history is taken to make sure that person has no history of diseases nor engages in risky social behavior (examples are intravenous drug use or sexual activity with multiple partners). The donor's travel history is  screened to minimize risk of transmitting infections, such as malaria. The donated blood is tested for signs of infectious diseases, such as HIV and hepatitis. The blood is then tested to be sure it is compatible with you in order to minimize the chance of a transfusion reaction. If you or a relative donates blood, this is often done in anticipation of surgery and is not appropriate for emergency situations. It takes many days to process the donated blood. RISKS AND COMPLICATIONS Although transfusion therapy is very safe and saves many lives, the main dangers of transfusion include:   Getting an infectious disease.  Developing a transfusion reaction. This is an allergic reaction to something in the blood you were given. Every precaution is taken to prevent this. The decision to have a blood transfusion has been considered carefully by your caregiver before blood is given. Blood is not given unless the benefits outweigh the risks.

## 2013-12-16 NOTE — Progress Notes (Signed)
Preop Note: Gyn-Onc  Consult was requested by Dr. Pamala Hurry for the evaluation of Melissa Ball 63 y.o. female  CC: Dr Marko Plume Chief Complaint  Patient presents with  . Stage IV serous endometrial cancer, s/p 3 cycles of dose dense carboplatin and paclitaxel.     Assessment/Plan:  Melissa Ball  is a 63 y.o.  year old woman with stage IV serous endometrial cancer who is seen for preoperative evaluation and consideration after 3 cycles of dose dense carboplatin and paclitaxel.  She is having a very good response to therapy (radiographic, biomarker and symptom reporting) and I am recommending that we intervene with surgery in approximately 6 weeks. I recommend 1 additional cycle (cycle 4) of paclitaxel and carboplatin, and then a period of 2-3 weeks to normalize counts. We would then attempt surgery with robotic assisted hysterectomy, BSO, possible omentectomy and lymphadenectomy. I discussed that the role of this surgery is largely palliative (to achieve local control in the pelvis, whch was the site of the greatest burden of her disease originally). I discussed that widely metastatic endometrial cancer is not cured with surgery and that she will require at least an additional 3 cycles of consolidative chemotherapy after surgery. I discussed the minimally invasive approach which may be associated with improved healing and decreased perioperative morbidity. We again discussed operative risks including  bleeding, infection, damage to internal organs (such as bladder,ureters, bowels), blood clot, reoperation and rehospitalization.  We will need to reassess her labs (particularly CBC within 1 week of surgery).  HPI: Melissa Ball was originally seen by me on June 22nd, 2015 in consultation at the request of Dr Valentino Saxon for advanced endometrial cancer.  She had first noted an episode of severe sharp lower abdominal pain in December 2014 which was postprandial and resolved after 12 hours with bowel  rest. She has had persistent right lower quadrant, flank pain and abdominal bloating since. Symptoms were worse with eating. She denied vaginal bleeding or discharge.   She underwent a routine gyn care in early June, 2015, with Dr Valentino Saxon after many years of no care. At that time a pap smear revealed AGUS results. This prompted an office examination and biopsy of the endocervix and endometrium on 09/12/13(both of which revealed serous endometrial cancer) and an office [place] which determined the pelvic mass (14.2 x 12 x 13.4cm) in the posterior cul de sac. The uterus was 7x5x2.3cm.   CA 125 drawn on 09/16/13: 964 (elevated)  CT scan on 09/26/13 revealed distant metastatic disease with pleural effusion, omental caking, peritoneal lesions, aortic adenopathy, and a very large 15cm pelvic mass.   Given her advanced disease and poor prognosis, in addition to the anticipated morbidity associated with a radical debulking for her disease distribution, she was recommended to undergo neoadjuvant chemotherapy with paclitaxel and carboplatin.  Dr Marko Plume is Melissa Ball's treating oncologist and she began therapy in July 2015. Her first cycle resulted in substantial toxicity and she was changed to a dose dense protocol which she tolerated much better. Her last cycle was in August 2015 (she is day 12 of cycle 3 today).  CA 125 has trended: 10/03/13: cycle 1:  1283 10/23/13: cycle 2: 1405 11/15/13: cycle 3: 221 12/04/13: 76/101(with new lab value)  CT scan on 12/12/13 revealed:  Improvement to near resolution of retroperitoneal adenopathy. Index left periaortic node measures 1.0 cm today versus 1.5 cm on the prior. No pelvic adenopathy. Improved appearance of the uterus and surrounding periuterine structures. The cystic mass  centered in the high left hemipelvis the measures 9.5 x 9.6 cm. Compare 11.9 x 11.6 cm on the prior exam. The portion involving the uterus has resolved, with relative normal appearance of the lower uterine  segment and cervix. There is decreased fluid versus posterior extension of cystic process into the cul-de-sac. Presumed bone island in the right iliac. Improved omental/ peritoneal disease with resolution of abdominal ascites. Left-sided omental implant measures 1.8 x 2.2 cm versus 3.8 x 3.0 cm on the prior.    Interval History: She is feeling dramatically better compared to when I first evaluated her. Her pain has resolved, she feels much less bloating, her appetite is good. She denies vaginal bleeding.  Current Meds:  Outpatient Encounter Prescriptions as of 12/16/2013  Medication Sig  . acetaminophen (TYLENOL) 500 MG tablet Take 500 mg by mouth as needed.  Marland Kitchen atorvastatin (LIPITOR) 10 MG tablet Take 1 tablet (10 mg total) by mouth daily.  Marland Kitchen dexamethasone (DECADRON) 4 MG tablet Take 5 tablets=20 mg with food 12 hrs prior to Taxol chemotherapy.  . ferrous fumarate (HEMOCYTE - 106 MG FE) 325 (106 FE) MG TABS tablet Take 1 tablet daily on empty stomach with orange juice.  Marland Kitchen HYDROcodone-acetaminophen (NORCO/VICODIN) 5-325 MG per tablet Take 1 tablet by mouth every 6 (six) hours as needed for moderate pain.  Marland Kitchen KLOR-CON M10 10 MEQ tablet   . lisinopril-hydrochlorothiazide (PRINZIDE,ZESTORETIC) 20-25 MG per tablet TAKE 1 TABLET BY MOUTH DAILY.  Marland Kitchen LORazepam (ATIVAN) 1 MG tablet Take 1/2-1 tablet under the tongue or swallow every 6 hrs as needed for nausea.  Will make Drowsy.  . meclizine (ANTIVERT) 25 MG tablet Take 25 mg by mouth every 6 (six) hours as needed for dizziness.   . Multiple Vitamin (MULTIVITAMIN) tablet Take 1 tablet by mouth daily.  . ondansetron (ZOFRAN) 8 MG tablet Take 1 tablet (8 mg total) by mouth every 8 (eight) hours as needed for nausea or vomiting.  . pantoprazole (PROTONIX) 40 MG tablet Take 1 tablet (40 mg total) by mouth daily.  . potassium chloride (K-DUR) 10 MEQ tablet Take 1 tablet (10 mEq total) by mouth daily.  . sennosides-docusate sodium (SENOKOT-S) 8.6-50 MG tablet  Take 1-4 tablets by mouth 2 (two) times daily as needed for constipation.  . sucralfate (CARAFATE) 1 GM/10ML suspension Take 10 mLs (1 g total) by mouth 4 (four) times daily -  with meals and at bedtime.  . Vitamin D, Cholecalciferol, 400 UNITS CHEW Chew by mouth.    Allergy: No Known Allergies  Social Hx:   History   Social History  . Marital Status: Divorced    Spouse Name: N/A    Number of Children: N/A  . Years of Education: N/A   Occupational History  . Housekeeper [employer]   Social History Main Topics  . Smoking status: Current Some Day Smoker  . Smokeless tobacco: Not on file  . Alcohol Use: 0.0 oz/week    0 Glasses of wine per week  . Drug Use: No  . Sexual Activity: Not on file   Other Topics Concern  . Not on file   Social History Narrative   Regular exercise-yes   Caffeine use-no             Past Surgical Hx:  Past Surgical History  Procedure Laterality Date  . Replacement total knee Left     Past Medical Hx:  Past Medical History  Diagnosis Date  . Hypertension   . Other and unspecified hyperlipidemia 05/28/2012  .  Hyperlipidemia   . Cancer     endometrial ca    Past Gynecological History: LMP age 39, postmenopausal. Hx of SVD x 1  Family Hx:  Family History  Problem Relation Age of Onset  . Hypertension Mother   . Cancer Mother 20    colon cancer  . Hypertension Father   . Heart disease Father   . Cancer Sister 71    Breast Cancer  . Alcohol abuse Brother   . Cancer Brother 60    Throat Cancer,lung cancer, smoker    Review of Systems:  Constitutional  Feels well  ENT Normal appearing ears and nares bilaterally Skin/Breast  No rash, sores, jaundice, itching, dryness Cardiovascular  No chest pain, shortness of breath, or edema  Pulmonary  No cough or wheeze.  Gastro Intestinal  No nausea, vomitting, or diarrhoea. No bright red blood per rectum, no abdominal pain, no change in bowel movement, no constipation, no bloating.   Genito Urinary  No frequency, urgency, dysuria, discharge or vaginal bleeding Musculo Skeletal  No myalgia, arthralgia, joint swelling or pain  Neurologic  No weakness, numbness, change in gait,  Psychology  No depression, anxiety, insomnia.   Vitals:  Blood pressure 109/69, pulse 90, temperature 98.1 F (36.7 C), temperature source Oral, resp. rate 20, height 5\' 4"  (1.626 m), weight 223 lb 6.4 oz (101.334 kg).  Physical Exam: WD in NAD Neck  Supple NROM, without any enlargements.  Lymph Node Survey No cervical supraclavicular or inguinal adenopathy Cardiovascular  Pulse normal rate, regularity and rhythm. S1 and S2 normal.  Lungs  Clear to auscultation bilateraly, without wheezes/crackles/rhonchi. Good air movement.  Skin  No rash/lesions/breakdown  Psychiatry  Alert and oriented to person, place, and time  Abdomen  Obese, no prior incisional scars. Normoactive bowel sounds, abdomen non-tender, without evidence of hernia. The previously palpable abdominal mass is no longer appreciated. Back No CVA tenderness Genito Urinary  Vulva/vagina: Normal external female genitalia.  No lesions. No discharge or bleeding.  Bladder/urethra:  No lesions or masses, well supported bladder  Vagina: normal appearing, some mild relaxation, normal rugation  Cervix: Normal appearing, no lesions, appears smooth.  Uterus:globular and increased in size, feels most consistent with small uterus and attached larger ovarian cyst.  No discrete parametrial involvement or nodularity.  Adnexa: fullness from the ovarian cyst appreciated posteriorally. Rectal  Good tone, no masses no cul de sac nodularity. Rectum feels free from the mass Extremities  No bilateral cyanosis, clubbing or edema.   Donaciano Eva, MD   12/16/2013, 5:22 PM

## 2013-12-16 NOTE — Telephone Encounter (Signed)
pt here today req earlier appt-gave pt new sch with updated time & date

## 2013-12-16 NOTE — Telephone Encounter (Signed)
, °

## 2013-12-20 ENCOUNTER — Ambulatory Visit: Payer: BC Managed Care – PPO

## 2013-12-20 ENCOUNTER — Other Ambulatory Visit: Payer: BC Managed Care – PPO

## 2013-12-20 ENCOUNTER — Telehealth: Payer: Self-pay | Admitting: *Deleted

## 2013-12-20 ENCOUNTER — Other Ambulatory Visit: Payer: Self-pay | Admitting: Oncology

## 2013-12-20 ENCOUNTER — Ambulatory Visit (HOSPITAL_BASED_OUTPATIENT_CLINIC_OR_DEPARTMENT_OTHER): Payer: BC Managed Care – PPO | Admitting: Oncology

## 2013-12-20 ENCOUNTER — Encounter: Payer: Self-pay | Admitting: Oncology

## 2013-12-20 ENCOUNTER — Ambulatory Visit: Payer: BC Managed Care – PPO | Admitting: Oncology

## 2013-12-20 ENCOUNTER — Encounter (HOSPITAL_COMMUNITY): Payer: Self-pay | Admitting: Pharmacy Technician

## 2013-12-20 ENCOUNTER — Other Ambulatory Visit (HOSPITAL_BASED_OUTPATIENT_CLINIC_OR_DEPARTMENT_OTHER): Payer: BC Managed Care – PPO

## 2013-12-20 VITALS — BP 113/80 | HR 105 | Temp 98.1°F | Resp 18 | Ht 64.0 in | Wt 222.5 lb

## 2013-12-20 DIAGNOSIS — K219 Gastro-esophageal reflux disease without esophagitis: Secondary | ICD-10-CM

## 2013-12-20 DIAGNOSIS — F17200 Nicotine dependence, unspecified, uncomplicated: Secondary | ICD-10-CM

## 2013-12-20 DIAGNOSIS — C541 Malignant neoplasm of endometrium: Secondary | ICD-10-CM

## 2013-12-20 DIAGNOSIS — C549 Malignant neoplasm of corpus uteri, unspecified: Secondary | ICD-10-CM

## 2013-12-20 DIAGNOSIS — D649 Anemia, unspecified: Secondary | ICD-10-CM

## 2013-12-20 DIAGNOSIS — I10 Essential (primary) hypertension: Secondary | ICD-10-CM

## 2013-12-20 DIAGNOSIS — E876 Hypokalemia: Secondary | ICD-10-CM

## 2013-12-20 DIAGNOSIS — Z23 Encounter for immunization: Secondary | ICD-10-CM

## 2013-12-20 LAB — CBC WITH DIFFERENTIAL/PLATELET
BASO%: 0 % (ref 0.0–2.0)
Basophils Absolute: 0 10*3/uL (ref 0.0–0.1)
EOS ABS: 0 10*3/uL (ref 0.0–0.5)
EOS%: 0 % (ref 0.0–7.0)
HCT: 25.5 % — ABNORMAL LOW (ref 34.8–46.6)
HGB: 8.4 g/dL — ABNORMAL LOW (ref 11.6–15.9)
LYMPH#: 0.8 10*3/uL — AB (ref 0.9–3.3)
LYMPH%: 30.4 % (ref 14.0–49.7)
MCH: 26.3 pg (ref 25.1–34.0)
MCHC: 32.9 g/dL (ref 31.5–36.0)
MCV: 79.7 fL (ref 79.5–101.0)
MONO#: 0.1 10*3/uL (ref 0.1–0.9)
MONO%: 2.4 % (ref 0.0–14.0)
NEUT#: 1.7 10*3/uL (ref 1.5–6.5)
NEUT%: 67.2 % (ref 38.4–76.8)
Platelets: 190 10*3/uL (ref 145–400)
RBC: 3.2 10*6/uL — ABNORMAL LOW (ref 3.70–5.45)
RDW: 23.5 % — ABNORMAL HIGH (ref 11.2–14.5)
WBC: 2.5 10*3/uL — AB (ref 3.9–10.3)

## 2013-12-20 LAB — COMPREHENSIVE METABOLIC PANEL (CC13)
ALT: 14 U/L (ref 0–55)
ANION GAP: 11 meq/L (ref 3–11)
AST: 18 U/L (ref 5–34)
Albumin: 3.6 g/dL (ref 3.5–5.0)
Alkaline Phosphatase: 80 U/L (ref 40–150)
BUN: 16.8 mg/dL (ref 7.0–26.0)
CALCIUM: 9.8 mg/dL (ref 8.4–10.4)
CHLORIDE: 106 meq/L (ref 98–109)
CO2: 21 meq/L — AB (ref 22–29)
CREATININE: 0.9 mg/dL (ref 0.6–1.1)
GLUCOSE: 126 mg/dL (ref 70–140)
Potassium: 4.3 mEq/L (ref 3.5–5.1)
SODIUM: 138 meq/L (ref 136–145)
TOTAL PROTEIN: 7.6 g/dL (ref 6.4–8.3)
Total Bilirubin: 0.29 mg/dL (ref 0.20–1.20)

## 2013-12-20 MED ORDER — INFLUENZA VAC SPLIT QUAD 0.5 ML IM SUSY
0.5000 mL | PREFILLED_SYRINGE | Freq: Once | INTRAMUSCULAR | Status: AC
  Administered 2013-12-20: 0.5 mL via INTRAMUSCULAR
  Filled 2013-12-20: qty 0.5

## 2013-12-20 MED ORDER — LIDOCAINE-PRILOCAINE 2.5-2.5 % EX CREA: TOPICAL_CREAM | CUTANEOUS | Status: DC

## 2013-12-20 NOTE — Progress Notes (Signed)
OFFICE PROGRESS NOTE   12/20/2013   Physicians:Emma Nada Maclachlan, MD , Aloha Gell   INTERVAL HISTORY:   Patient is seen, alone for visit, continuing neoadjuvant dose dense carbo taxol for advanced gyn cancer. She is clearly responding to this treatment, with plan now to complete this cycle then take 2-3 week break prior to surgery by gyn oncology on 01-21-14. She was due day 8 cycle 3 today, however this will be delayed a week due to IV access concerns.  Peripheral IV access is no longer adequate for chemotherapy, evidenced by skin irritation left forearm since day 1 cycle 3 (=cycle 4 total) given 12-13-13. Patient used hydrocortisone cream initially without improvement, and is not using emollient cream which is more soothing. She does not have skin desquamation and this does not seem infected. I was not aware of this problem until visit today. We have discussed PAC, which will be needed prior to further chemo; she understands that we expect additional chemo after surgery also.  Otherwise she is feeling generally well. Allergic sinus drainage and associated cough are some better since she is avoiding outdoors, taking honey, did not try claritin/ benadryl etc. She still has some clear rhinorrhea, no cough, no fever. Appetite is good without N/V. SOB with exertion no worse, none with lesser activity. No abdominal discomfort now. Bowels moving well. No bleeding, is taking oral iron. No significant peripheral neuropathy.  For PAC by IR next available Flu vaccine given today.  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 14cm adnexal mass.     Endometrial cancer determined by uterine biopsy   09/16/2013 Initial Diagnosis Endometrial cancer determined by uterine biopsy, and endocervical biopsy  Patient had been in usual good health until acute episode of abdominal pain in Dec 2014, which eventually resolved  without medical attention. More recently she has had vague diffuse abdominal discomfort, low grade nausea and abdominal bloating. She was seen in June 2015 for first gyn exam in years, PAP with AGUS and follow up biopsies of endocervix and endometrium by Dr Pamala Hurry both with serous endometrial cancer (pathology from [facility] 09-12-13, case # [id number] to be scanned into this EMR). Biopsy of cervix had normal squamous epithelium. CA125 from [facility] 09-16-13 was 964. She was seen by Dr Denman George on 09-23-13, her exam remarkable for large pelvic mass minimally mobile and extending to umbilicus. She had CT CAP 09-26-13 had prominent but not enlarged juxtapericardiac lymph nodes, trace right pleural effusion, large amount of abnormal soft tissue thruout peritoneal cavity, predominately with omentum and especially LUQ, largest area 3.8 x 3.0 x 4.0 cm anterior to proximal descending colon, implants adjacent to liver, large complex multicystic lesion in pelvis inseperable from uterus and adnexae, with mass effect on bladder, questionable area in central aspect of dome of liver, borderline enlarged retroperitoneal nodes. Due to extent of disease on CT, Dr Denman George has recommended that treatment begin with chemotherapy, with consideration of interval debulking surgery depending on response to systemic treatment. Cycle one taxol carboplatin was given on 07-03-00, complicated by severe nausea/vomiting, constipation and severe taxol aches. Cycle 2 was changed to dose dense carbo taxol, day 1 on 11-01-13. She needed gCSF support by cycle 3.   Review of systems as above, also: Bladder ok. No other skin rash. No chest pain. Slight vertigo symptoms improved/ essentially resolved. Remainder of 10 point Review of Systems negative.  Objective:  Vital signs in last  24 hours:  BP 113/80  Pulse 105  Temp(Src) 98.1 F (36.7 C) (Oral)  Resp 18  Ht 5\' 4"  (1.626 m)  Wt 222 lb 8 oz (100.925 kg)  BMI 38.17 kg/m2 weight  down 1 lb  Alert, oriented and appropriate. Ambulatory without assistance. Respirations not labored RA, does not sound congested. Near complete alopecia  HEENT:PERRL, sclerae not icteric. Oral mucosa moist without lesions, posterior pharynx clear.  Neck supple. No JVD.  Lymphatics:no cervical,supraclavicular or inguinal adenopathy Resp: clear to auscultation bilaterally and normal percussion bilaterally Cardio: regular rate and rhythm. No gallop. GI: abdomen obese, soft, nontender, not distended, no appreciable mass or organomegaly. Normally active bowel sounds.  Musculoskeletal/ Extremities: LE without pitting edema, cords, tenderness Neuro: no peripheral neuropathy. Otherwise nonfocal. PSYCH appropriate mood and affect Skin: Right mid forearm with area of discoloration and slightly bullous skin changes/ not vesicles ~ 2.5 x 4 cm, no streaking, no heat, not painful, no desquamation, no swelling of arm. Area dorsum right hand slight ecchymosis, apparently subsequent stick (?). Otherwise without ecchymosis, petechiae.    Lab Results:  Results for orders placed in visit on 12/20/13  CBC WITH DIFFERENTIAL      Result Value Ref Range   WBC 2.5 (*) 3.9 - 10.3 10e3/uL   NEUT# 1.7  1.5 - 6.5 10e3/uL   HGB 8.4 (*) 11.6 - 15.9 g/dL   HCT 25.5 (*) 34.8 - 46.6 %   Platelets 190  145 - 400 10e3/uL   MCV 79.7  79.5 - 101.0 fL   MCH 26.3  25.1 - 34.0 pg   MCHC 32.9  31.5 - 36.0 g/dL   RBC 3.20 (*) 3.70 - 5.45 10e6/uL   RDW 23.5 (*) 11.2 - 14.5 %   lymph# 0.8 (*) 0.9 - 3.3 10e3/uL   MONO# 0.1  0.1 - 0.9 10e3/uL   Eosinophils Absolute 0.0  0.0 - 0.5 10e3/uL   Basophils Absolute 0.0  0.0 - 0.1 10e3/uL   NEUT% 67.2  38.4 - 76.8 %   LYMPH% 30.4  14.0 - 49.7 %   MONO% 2.4  0.0 - 14.0 %   EOS% 0.0  0.0 - 7.0 %   BASO% 0.0  0.0 - 2.0 %    CMET available after visit unremarkable  Studies/Results:  No results found. CT AP 12-13-13 as previously noted.  Medications: I have reviewed the patient's  current medications.  DISCUSSION: Discussed need for [facility], teaching also done by RN now. Timing to complete this cycle chemo even with a week delay for [facility] placement and still have break prior to surgery will work. I spoke directly with IR, not able to place PICC today either, but able to assist with PAC within the week, which is needed long term regardless. Son plans to come for surgery.  Assessment/Plan:  1. Clinical stage IV serous endometrial carcinoma:Tolerating dose dense regimen well, clinically much improved. Using gCSF day after each chemo. Delay treatment from today to next week for [facility] placement. Will complete present cycle, then surgery by gyn oncology Oct 20. Expect additional chemo will be appropriate after surgery.  2. Peripheral IV access no longer adequate for chemo, with skin irritation right forearm since last treatment. PAC by IR requested. Continue moisturizing cream to arm and follow. 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 HTN: Resumed lisinopril HCTZ , follow BP  7.constipation: controlled with present laxatives.  8.mild hypokalemia: improved on KCl 10  mEq daily, seems related to HCTZ 9. Anemia: mutlifactorial, post transfusion 2 units PRBCs 7-17 and continuing oral iron. May need PRBCs around surgery. 10.reported small volume hematemesis after several episodes of vomiting day 2-3 of cycle 1. None since and epigastric discomfort much better on protonix initially and now carafate, which she will continue. GERD preceded chemotherapy. 11.allergic sinusitis and vertigo: nearly resolved 12.flu vaccine given today since chemo held  I will see her with treatment 10-2. Chemo and gCSF orders adjusted   Marsela Kuan P, MD   12/20/2013, 9:02 AM

## 2013-12-20 NOTE — Telephone Encounter (Signed)
Per staff message and POF I have scheduled appts. Advised scheduler of appts. JMW  

## 2013-12-21 ENCOUNTER — Ambulatory Visit: Payer: BC Managed Care – PPO

## 2013-12-23 ENCOUNTER — Other Ambulatory Visit: Payer: Self-pay | Admitting: Radiology

## 2013-12-23 ENCOUNTER — Ambulatory Visit: Payer: BC Managed Care – PPO | Admitting: Oncology

## 2013-12-24 ENCOUNTER — Other Ambulatory Visit: Payer: Self-pay | Admitting: Radiology

## 2013-12-25 ENCOUNTER — Ambulatory Visit (HOSPITAL_COMMUNITY)
Admission: RE | Admit: 2013-12-25 | Discharge: 2013-12-25 | Disposition: A | Discharge status: Home / Self Care | Payer: BC Managed Care – PPO | Source: Ambulatory Visit | Attending: Oncology | Admitting: Oncology

## 2013-12-25 ENCOUNTER — Encounter (HOSPITAL_COMMUNITY): Payer: Self-pay

## 2013-12-25 ENCOUNTER — Other Ambulatory Visit: Payer: Self-pay | Admitting: Oncology

## 2013-12-25 DIAGNOSIS — C541 Malignant neoplasm of endometrium: Secondary | ICD-10-CM

## 2013-12-25 DIAGNOSIS — Z79899 Other long term (current) drug therapy: Secondary | ICD-10-CM | POA: Diagnosis not present

## 2013-12-25 DIAGNOSIS — I10 Essential (primary) hypertension: Secondary | ICD-10-CM | POA: Diagnosis not present

## 2013-12-25 DIAGNOSIS — E785 Hyperlipidemia, unspecified: Secondary | ICD-10-CM | POA: Diagnosis not present

## 2013-12-25 DIAGNOSIS — F17200 Nicotine dependence, unspecified, uncomplicated: Secondary | ICD-10-CM | POA: Insufficient documentation

## 2013-12-25 DIAGNOSIS — C549 Malignant neoplasm of corpus uteri, unspecified: Secondary | ICD-10-CM | POA: Insufficient documentation

## 2013-12-25 DIAGNOSIS — Z452 Encounter for adjustment and management of vascular access device: Secondary | ICD-10-CM | POA: Diagnosis present

## 2013-12-25 LAB — CBC WITH DIFFERENTIAL/PLATELET
BASOS ABS: 0 10*3/uL (ref 0.0–0.1)
Basophils Relative: 0 % (ref 0–1)
EOS ABS: 0 10*3/uL (ref 0.0–0.7)
Eosinophils Relative: 0 % (ref 0–5)
HCT: 23.6 % — ABNORMAL LOW (ref 36.0–46.0)
Hemoglobin: 7.6 g/dL — ABNORMAL LOW (ref 12.0–15.0)
Lymphocytes Relative: 49 % — ABNORMAL HIGH (ref 12–46)
Lymphs Abs: 2.2 10*3/uL (ref 0.7–4.0)
MCH: 26.7 pg (ref 26.0–34.0)
MCHC: 32.2 g/dL (ref 30.0–36.0)
MCV: 82.8 fL (ref 78.0–100.0)
Monocytes Absolute: 0.4 10*3/uL (ref 0.1–1.0)
Monocytes Relative: 9 % (ref 3–12)
NEUTROS PCT: 42 % — AB (ref 43–77)
Neutro Abs: 1.9 10*3/uL (ref 1.7–7.7)
PLATELETS: 275 10*3/uL (ref 150–400)
RBC: 2.85 MIL/uL — ABNORMAL LOW (ref 3.87–5.11)
RDW: 25.6 % — AB (ref 11.5–15.5)
WBC: 4.5 10*3/uL (ref 4.0–10.5)

## 2013-12-25 LAB — PROTIME-INR
INR: 0.92 (ref 0.00–1.49)
PROTHROMBIN TIME: 12.4 s (ref 11.6–15.2)

## 2013-12-25 LAB — APTT: aPTT: 30 seconds (ref 24–37)

## 2013-12-25 MED ORDER — FENTANYL CITRATE 0.05 MG/ML IJ SOLN
INTRAMUSCULAR | Status: AC
  Filled 2013-12-25: qty 4

## 2013-12-25 MED ORDER — HEPARIN SOD (PORK) LOCK FLUSH 100 UNIT/ML IV SOLN
INTRAVENOUS | Status: AC
  Filled 2013-12-25: qty 5

## 2013-12-25 MED ORDER — CEFAZOLIN SODIUM-DEXTROSE 2-3 GM-% IV SOLR
INTRAVENOUS | Status: AC
  Filled 2013-12-25: qty 50

## 2013-12-25 MED ORDER — LIDOCAINE HCL 1 % IJ SOLN
INTRAMUSCULAR | Status: AC
  Filled 2013-12-25: qty 20

## 2013-12-25 MED ORDER — FENTANYL CITRATE 0.05 MG/ML IJ SOLN
INTRAMUSCULAR | Status: AC | PRN
  Administered 2013-12-25: 100 ug via INTRAVENOUS

## 2013-12-25 MED ORDER — SODIUM CHLORIDE 0.9 % IV SOLN
INTRAVENOUS | Status: DC
  Administered 2013-12-25: 12:00:00 via INTRAVENOUS

## 2013-12-25 MED ORDER — HEPARIN SOD (PORK) LOCK FLUSH 100 UNIT/ML IV SOLN
500.0000 [IU] | Freq: Once | INTRAVENOUS | Status: AC
  Administered 2013-12-25: 500 [IU] via INTRAVENOUS

## 2013-12-25 MED ORDER — CEFAZOLIN SODIUM-DEXTROSE 2-3 GM-% IV SOLR
2.0000 g | INTRAVENOUS | Status: AC
  Administered 2013-12-25: 2 g via INTRAVENOUS

## 2013-12-25 MED ORDER — MIDAZOLAM HCL 2 MG/2ML IJ SOLN
INTRAMUSCULAR | Status: AC
  Filled 2013-12-25: qty 4

## 2013-12-25 MED ORDER — LIDOCAINE-EPINEPHRINE (PF) 2 %-1:200000 IJ SOLN
INTRAMUSCULAR | Status: AC
  Filled 2013-12-25: qty 20

## 2013-12-25 MED ORDER — MIDAZOLAM HCL 2 MG/2ML IJ SOLN
INTRAMUSCULAR | Status: AC | PRN
  Administered 2013-12-25 (×3): 1 mg via INTRAVENOUS

## 2013-12-25 NOTE — Procedures (Signed)
Interventional Radiology Procedure Note  Procedure: Placement of a right IJ approach single lumen PowerPort.  Tip is positioned at the superior cavoatrial junction and catheter is ready for immediate use.  Complications: No immediate Recommendations:  - Ok to shower tomorrow - Do not submerge for 7 days - Routine line care   Signed,  Maitland Lesiak K. Jaishon Krisher, MD   

## 2013-12-25 NOTE — Discharge Instructions (Signed)
Implanted Port Insertion, Care After °Refer to this sheet in the next few weeks. These instructions provide you with information on caring for yourself after your procedure. Your health care provider may also give you more specific instructions. Your treatment has been planned according to current medical practices, but problems sometimes occur. Call your health care provider if you have any problems or questions after your procedure. °WHAT TO EXPECT AFTER THE PROCEDURE °After your procedure, it is typical to have the following:  °· Discomfort at the port insertion site. Ice packs to the area will help. °· Bruising on the skin over the port. This will subside in 3-4 days. °HOME CARE INSTRUCTIONS °· After your port is placed, you will get a manufacturer's information card. The card has information about your port. Keep this card with you at all times.   °· Know what kind of port you have. There are many types of ports available.   °· Wear a medical alert bracelet in case of an emergency. This can help alert health care workers that you have a port.   °· The port can stay in for as long as your health care provider believes it is necessary.   °· A home health care nurse may give medicines and take care of the port.   °· You or a family member can get special training and directions for giving medicine and taking care of the port at home.   °SEEK MEDICAL CARE IF:  °· Your port does not flush or you are unable to get a blood return.   °· You have a fever or chills. °SEEK IMMEDIATE MEDICAL CARE IF: °· You have new fluid or pus coming from your incision.   °· You notice a bad smell coming from your incision site.   °· You have swelling, pain, or more redness at the incision or port site.   °· You have chest pain or shortness of breath. °Document Released: 01/09/2013 Document Revised: 03/26/2013 Document Reviewed: 01/09/2013 °Exit[employer]® Patient Information ©2015 [employer]. This information is not intended to replace  advice given to you by your health care provider. Make sure you discuss any questions you have with your health care provider. °Conscious Sedation, Adult, Care After °Refer to this sheet in the next few weeks. These instructions provide you with information on caring for yourself after your procedure. Your health care provider may also give you more specific instructions. Your treatment has been planned according to current medical practices, but problems sometimes occur. Call your health care provider if you have any problems or questions after your procedure. °WHAT TO EXPECT AFTER THE PROCEDURE  °After your procedure: °· You may feel sleepy, clumsy, and have poor balance for several hours. °· Vomiting may occur if you eat too soon after the procedure. °HOME CARE INSTRUCTIONS °· Do not participate in any activities where you could become injured for at least 24 hours. Do not: °¨ Drive. °¨ Swim. °¨ Ride a bicycle. °¨ Operate heavy machinery. °¨ Cook. °¨ Use power tools. °¨ Climb ladders. °¨ Work from a high place. °· Do not make important decisions or sign legal documents until you are improved. °· If you vomit, drink water, juice, or soup when you can drink without vomiting. Make sure you have little or no nausea before eating solid foods. °· Only take over-the-counter or prescription medicines for pain, discomfort, or fever as directed by your health care provider. °· Make sure you and your family fully understand everything about the medicines given to you, including what side effects   may occur. °· You should not drink alcohol, take sleeping pills, or take medicines that cause drowsiness for at least 24 hours. °· If you smoke, do not smoke without supervision. °· If you are feeling better, you may resume normal activities 24 hours after you were sedated. °· Keep all appointments with your health care provider. °SEEK MEDICAL CARE IF: °· Your skin is pale or bluish in color. °· You continue to feel nauseous or  vomit. °· Your pain is getting worse and is not helped by medicine. °· You have bleeding or swelling. °· You are still sleepy or feeling clumsy after 24 hours. °SEEK IMMEDIATE MEDICAL CARE IF: °· You develop a rash. °· You have difficulty breathing. °· You develop any type of allergic problem. °· You have a fever. °MAKE SURE YOU: °· Understand these instructions. °· Will watch your condition. °· Will get help right away if you are not doing well or get worse. °Document Released: 01/09/2013 Document Reviewed: 01/09/2013 °ExitCare® Patient Information ©2015 [employer]. This information is not intended to replace advice given to you by your health care provider. Make sure you discuss any questions you have with your health care provider. ° ° °

## 2013-12-25 NOTE — H&P (Signed)
Chief Complaint: "I'm here for a port" Referring Physician:Livesay HPI: Melissa Ball is an 63 y.o. female with endometrial cancer. She is scheduled today for port placement to begin chemotherapy. PMHx, meds, labs reviewed. Pt has been NPO today Denies recent fevers, chills, illness  Past Medical History:  Past Medical History  Diagnosis Date  . Hypertension   . Other and unspecified hyperlipidemia 05/28/2012  . Hyperlipidemia   . Cancer     endometrial ca    Past Surgical History:  Past Surgical History  Procedure Laterality Date  . Replacement total knee Left     Family History:  Family History  Problem Relation Age of Onset  . Hypertension Mother   . Cancer Mother 68    colon cancer  . Hypertension Father   . Heart disease Father   . Cancer Sister 73    Breast Cancer  . Alcohol abuse Brother   . Cancer Brother 60    Throat Cancer,lung cancer, smoker    Social History:  reports that she has been smoking.  She does not have any smokeless tobacco history on file. She reports that she drinks alcohol. She reports that she does not use illicit drugs.  Allergies: No Known Allergies  Medications:   Medication List    ASK your doctor about these medications       acetaminophen 500 MG tablet  Commonly known as:  TYLENOL  Take 500-1,000 mg by mouth as needed for mild pain.     atorvastatin 10 MG tablet  Commonly known as:  LIPITOR  Take 10 mg by mouth every morning.     dexamethasone 4 MG tablet  Commonly known as:  DECADRON  Take 5 tablets=20 mg with food 12 hrs prior to Taxol chemotherapy.     docusate sodium 100 MG capsule  Commonly known as:  COLACE  Take 100 mg by mouth daily.     ferrous fumarate 325 (106 FE) MG Tabs tablet  Commonly known as:  HEMOCYTE - 106 mg FE  Take 1 tablet daily on empty stomach with orange juice.     HYDROcodone-acetaminophen 5-325 MG per tablet  Commonly known as:  NORCO/VICODIN  Take 1 tablet by mouth every 6 (six)  hours as needed for moderate pain.     lidocaine-prilocaine cream  Commonly known as:  EMLA  Apply to Porta-Cath 1-2 hours prior to access as directed     lisinopril-hydrochlorothiazide 20-25 MG per tablet  Commonly known as:  PRINZIDE,ZESTORETIC  Take 1 tablet by mouth every morning.     LORazepam 1 MG tablet  Commonly known as:  ATIVAN  Take 1/2-1 tablet under the tongue or swallow every 6 hrs as needed for nausea.  Will make Drowsy.     meclizine 25 MG tablet  Commonly known as:  ANTIVERT  Take 25 mg by mouth every 6 (six) hours as needed for dizziness.     multivitamin tablet  Take 1 tablet by mouth daily.     ondansetron 8 MG tablet  Commonly known as:  ZOFRAN  Take 1 tablet (8 mg total) by mouth every 8 (eight) hours as needed for nausea or vomiting.     pantoprazole 40 MG tablet  Commonly known as:  PROTONIX  Take 1 tablet (40 mg total) by mouth daily.     potassium chloride 10 MEQ tablet  Commonly known as:  K-DUR  Take 1 tablet (10 mEq total) by mouth daily.     sennosides-docusate sodium 8.6-50 MG  tablet  Commonly known as:  SENOKOT-S  Take 1-4 tablets by mouth 2 (two) times daily as needed for constipation.     sucralfate 1 GM/10ML suspension  Commonly known as:  CARAFATE  Take 10 mLs (1 g total) by mouth 4 (four) times daily -  with meals and at bedtime.     vitamin E 400 UNIT capsule  Take 400 Units by mouth every morning.        Please HPI for pertinent positives, otherwise complete 10 system ROS negative.  Physical Exam: BP 121/82  Pulse 99  Temp(Src) 98.1 F (36.7 C) (Oral)  Resp 18  SpO2 100% There is no weight on file to calculate BMI.   General Appearance:  Alert, cooperative, no distress, appears stated age  Head:  Normocephalic, without obvious abnormality, atraumatic  ENT: Unremarkable  Neck: Supple, symmetrical, trachea midline  Lungs:   Clear to auscultation bilaterally, no w/r/r, respirations unlabored without use of accessory  muscles.  Chest Wall:  No tenderness or deformity  Heart:  Regular rate and rhythm, S1, S2 normal, no murmur, rub or gallop.  Neurologic: Normal affect, no gross deficits.  Labs: Results for orders placed during the hospital encounter of 12/25/13 (from the past 48 hour(s))  CBC WITH DIFFERENTIAL     Status: Abnormal   Collection Time    12/25/13 12:11 PM      Result Value Ref Range   WBC 4.5  4.0 - 10.5 K/uL   RBC 2.85 (*) 3.87 - 5.11 MIL/uL   Hemoglobin 7.6 (*) 12.0 - 15.0 g/dL   HCT 23.6 (*) 36.0 - 46.0 %   MCV 82.8  78.0 - 100.0 fL   MCH 26.7  26.0 - 34.0 pg   MCHC 32.2  30.0 - 36.0 g/dL   RDW 25.6 (*) 11.5 - 15.5 %   Platelets 275  150 - 400 K/uL   Neutrophils Relative % 42 (*) 43 - 77 %   Neutro Abs 1.9  1.7 - 7.7 K/uL   Lymphocytes Relative 49 (*) 12 - 46 %   Lymphs Abs 2.2  0.7 - 4.0 K/uL   Monocytes Relative 9  3 - 12 %   Monocytes Absolute 0.4  0.1 - 1.0 K/uL   Eosinophils Relative 0  0 - 5 %   Eosinophils Absolute 0.0  0.0 - 0.7 K/uL   Basophils Relative 0  0 - 1 %   Basophils Absolute 0.0  0.0 - 0.1 K/uL    Imaging: No results found.  Assessment/Plan Endometrial cancer Explained port placement procedure, including risks, complications, use of sedation. Labs reviewed Consent signed in chart  Ascencion Dike PA-C 12/25/2013, 1:22 PM

## 2013-12-27 ENCOUNTER — Ambulatory Visit (HOSPITAL_BASED_OUTPATIENT_CLINIC_OR_DEPARTMENT_OTHER): Payer: BC Managed Care – PPO

## 2013-12-27 ENCOUNTER — Other Ambulatory Visit: Payer: BC Managed Care – PPO

## 2013-12-27 ENCOUNTER — Other Ambulatory Visit: Payer: Self-pay

## 2013-12-27 ENCOUNTER — Other Ambulatory Visit: Payer: Self-pay | Admitting: Oncology

## 2013-12-27 ENCOUNTER — Ambulatory Visit (HOSPITAL_COMMUNITY)
Admission: RE | Admit: 2013-12-27 | Discharge: 2013-12-27 | Disposition: A | Discharge status: Home / Self Care | Payer: BC Managed Care – PPO | Source: Ambulatory Visit | Attending: Oncology | Admitting: Oncology

## 2013-12-27 ENCOUNTER — Other Ambulatory Visit (HOSPITAL_BASED_OUTPATIENT_CLINIC_OR_DEPARTMENT_OTHER): Payer: BC Managed Care – PPO

## 2013-12-27 ENCOUNTER — Ambulatory Visit: Payer: BC Managed Care – PPO

## 2013-12-27 VITALS — BP 103/62 | HR 105 | Temp 98.2°F | Resp 16

## 2013-12-27 DIAGNOSIS — Z1504 Genetic susceptibility to malignant neoplasm of endometrium: Secondary | ICD-10-CM | POA: Diagnosis not present

## 2013-12-27 DIAGNOSIS — C549 Malignant neoplasm of corpus uteri, unspecified: Secondary | ICD-10-CM

## 2013-12-27 DIAGNOSIS — C541 Malignant neoplasm of endometrium: Secondary | ICD-10-CM

## 2013-12-27 DIAGNOSIS — D649 Anemia, unspecified: Secondary | ICD-10-CM | POA: Diagnosis not present

## 2013-12-27 DIAGNOSIS — Z5111 Encounter for antineoplastic chemotherapy: Secondary | ICD-10-CM

## 2013-12-27 DIAGNOSIS — Z95828 Presence of other vascular implants and grafts: Secondary | ICD-10-CM

## 2013-12-27 LAB — CBC WITH DIFFERENTIAL/PLATELET
BASO%: 0 % (ref 0.0–2.0)
Basophils Absolute: 0 10*3/uL (ref 0.0–0.1)
EOS%: 0 % (ref 0.0–7.0)
Eosinophils Absolute: 0 10*3/uL (ref 0.0–0.5)
HEMATOCRIT: 24.1 % — AB (ref 34.8–46.6)
HGB: 7.7 g/dL — ABNORMAL LOW (ref 11.6–15.9)
LYMPH%: 24.9 % (ref 14.0–49.7)
MCH: 26.2 pg (ref 25.1–34.0)
MCHC: 32 g/dL (ref 31.5–36.0)
MCV: 82 fL (ref 79.5–101.0)
MONO#: 0.2 10*3/uL (ref 0.1–0.9)
MONO%: 4.4 % (ref 0.0–14.0)
NEUT#: 3.2 10*3/uL (ref 1.5–6.5)
NEUT%: 70.7 % (ref 38.4–76.8)
Platelets: 230 10*3/uL (ref 145–400)
RBC: 2.94 10*6/uL — ABNORMAL LOW (ref 3.70–5.45)
RDW: 26.1 % — ABNORMAL HIGH (ref 11.2–14.5)
WBC: 4.6 10*3/uL (ref 3.9–10.3)
lymph#: 1.1 10*3/uL (ref 0.9–3.3)

## 2013-12-27 LAB — COMPREHENSIVE METABOLIC PANEL (CC13)
ALK PHOS: 67 U/L (ref 40–150)
ALT: 9 U/L (ref 0–55)
ANION GAP: 11 meq/L (ref 3–11)
AST: 16 U/L (ref 5–34)
Albumin: 3.5 g/dL (ref 3.5–5.0)
BILIRUBIN TOTAL: 0.21 mg/dL (ref 0.20–1.20)
BUN: 29.8 mg/dL — ABNORMAL HIGH (ref 7.0–26.0)
CO2: 20 mEq/L — ABNORMAL LOW (ref 22–29)
CREATININE: 1.1 mg/dL (ref 0.6–1.1)
Calcium: 9.6 mg/dL (ref 8.4–10.4)
Chloride: 105 mEq/L (ref 98–109)
Glucose: 112 mg/dl (ref 70–140)
Potassium: 4.1 mEq/L (ref 3.5–5.1)
Sodium: 136 mEq/L (ref 136–145)
Total Protein: 7.4 g/dL (ref 6.4–8.3)

## 2013-12-27 LAB — PREPARE RBC (CROSSMATCH)

## 2013-12-27 MED ORDER — SODIUM CHLORIDE 0.9 % IJ SOLN
10.0000 mL | INTRAMUSCULAR | Status: DC | PRN
  Administered 2013-12-27: 10 mL via INTRAVENOUS
  Filled 2013-12-27: qty 10

## 2013-12-27 MED ORDER — ONDANSETRON 8 MG/50ML IVPB (CHCC)
8.0000 mg | Freq: Once | INTRAVENOUS | Status: AC
  Administered 2013-12-27: 8 mg via INTRAVENOUS

## 2013-12-27 MED ORDER — DEXAMETHASONE SODIUM PHOSPHATE 20 MG/5ML IJ SOLN
20.0000 mg | Freq: Once | INTRAMUSCULAR | Status: AC
  Administered 2013-12-27: 20 mg via INTRAVENOUS

## 2013-12-27 MED ORDER — FAMOTIDINE IN NACL 20-0.9 MG/50ML-% IV SOLN
INTRAVENOUS | Status: AC
  Filled 2013-12-27: qty 50

## 2013-12-27 MED ORDER — PACLITAXEL CHEMO INJECTION 300 MG/50ML
80.0000 mg/m2 | Freq: Once | INTRAVENOUS | Status: AC
  Administered 2013-12-27: 168 mg via INTRAVENOUS
  Filled 2013-12-27: qty 28

## 2013-12-27 MED ORDER — FAMOTIDINE IN NACL 20-0.9 MG/50ML-% IV SOLN
20.0000 mg | Freq: Once | INTRAVENOUS | Status: AC
  Administered 2013-12-27: 20 mg via INTRAVENOUS

## 2013-12-27 MED ORDER — SODIUM CHLORIDE 0.9 % IV SOLN
Freq: Once | INTRAVENOUS | Status: AC
  Administered 2013-12-27: 12:00:00 via INTRAVENOUS

## 2013-12-27 MED ORDER — SODIUM CHLORIDE 0.9 % IJ SOLN
10.0000 mL | INTRAMUSCULAR | Status: DC | PRN
  Administered 2013-12-27: 10 mL
  Filled 2013-12-27: qty 10

## 2013-12-27 MED ORDER — HEPARIN SOD (PORK) LOCK FLUSH 100 UNIT/ML IV SOLN
500.0000 [IU] | Freq: Once | INTRAVENOUS | Status: AC
  Administered 2013-12-27: 500 [IU] via INTRAVENOUS
  Filled 2013-12-27: qty 5

## 2013-12-27 MED ORDER — ONDANSETRON 8 MG/NS 50 ML IVPB
INTRAVENOUS | Status: AC
  Filled 2013-12-27: qty 8

## 2013-12-27 MED ORDER — HEPARIN SOD (PORK) LOCK FLUSH 100 UNIT/ML IV SOLN
500.0000 [IU] | Freq: Once | INTRAVENOUS | Status: AC | PRN
  Administered 2013-12-27: 500 [IU]
  Filled 2013-12-27: qty 5

## 2013-12-27 MED ORDER — DEXAMETHASONE SODIUM PHOSPHATE 20 MG/5ML IJ SOLN
INTRAMUSCULAR | Status: AC
  Filled 2013-12-27: qty 5

## 2013-12-27 MED ORDER — DIPHENHYDRAMINE HCL 50 MG/ML IJ SOLN
INTRAMUSCULAR | Status: AC
  Filled 2013-12-27: qty 1

## 2013-12-27 MED ORDER — DIPHENHYDRAMINE HCL 50 MG/ML IJ SOLN
50.0000 mg | Freq: Once | INTRAMUSCULAR | Status: AC
  Administered 2013-12-27: 50 mg via INTRAVENOUS

## 2013-12-27 NOTE — Progress Notes (Signed)
Per Dr.Livesay, may treat pt today. Set pt up from blood transfusion of 1 unit next week.

## 2013-12-27 NOTE — Patient Instructions (Signed)

## 2013-12-27 NOTE — Progress Notes (Unsigned)
Medical Oncology  Spoke with RN in infusion now re CBC today. OK for weekly taxol today. Not too symptomatic from anemia, but still lower than needed with upcoming treatment and surgery. I would like for her to have 1 unit PRBCs in next few days if patient agrees. RN can put in orders when date known. She will keep appointment with me next week, and should be able to have day 15 cycle 3 taxol then. Chemo will be held after 01-03-14 for upcoming surgery.  Godfrey Pick, MD

## 2013-12-27 NOTE — Patient Instructions (Signed)
[place] Discharge Instructions for Patients Receiving Chemotherapy  Use Ecerin creame on abrasion on right forearm per Barbaraann Share, RN.  Today you received the following chemotherapy agents Taxol.  To help prevent nausea and vomiting after your treatment, we encourage you to take your nausea medication as prescribed.   If you develop nausea and vomiting that is not controlled by your nausea medication, call the clinic.   BELOW ARE SYMPTOMS THAT SHOULD BE REPORTED IMMEDIATELY:  *FEVER GREATER THAN 100.5 F  *CHILLS WITH OR WITHOUT FEVER  NAUSEA AND VOMITING THAT IS NOT CONTROLLED WITH YOUR NAUSEA MEDICATION  *UNUSUAL SHORTNESS OF BREATH  *UNUSUAL BRUISING OR BLEEDING  TENDERNESS IN MOUTH AND THROAT WITH OR WITHOUT PRESENCE OF ULCERS  *URINARY PROBLEMS  *BOWEL PROBLEMS  UNUSUAL RASH Items with * indicate a potential emergency and should be followed up as soon as possible.  Feel free to call the clinic you have any questions or concerns. The clinic phone number is [phone].

## 2013-12-28 ENCOUNTER — Ambulatory Visit (HOSPITAL_BASED_OUTPATIENT_CLINIC_OR_DEPARTMENT_OTHER): Payer: BC Managed Care – PPO

## 2013-12-28 VITALS — BP 121/81 | HR 101 | Temp 98.4°F | Resp 16

## 2013-12-28 DIAGNOSIS — C541 Malignant neoplasm of endometrium: Secondary | ICD-10-CM

## 2013-12-28 DIAGNOSIS — C549 Malignant neoplasm of corpus uteri, unspecified: Secondary | ICD-10-CM

## 2013-12-28 DIAGNOSIS — Z5189 Encounter for other specified aftercare: Secondary | ICD-10-CM

## 2013-12-28 MED ORDER — TBO-FILGRASTIM 300 MCG/0.5ML ~~LOC~~ SOSY
300.0000 ug | PREFILLED_SYRINGE | Freq: Once | SUBCUTANEOUS | Status: AC
  Administered 2013-12-28: 300 ug via SUBCUTANEOUS

## 2013-12-29 ENCOUNTER — Other Ambulatory Visit: Payer: Self-pay | Admitting: Oncology

## 2013-12-30 ENCOUNTER — Ambulatory Visit (HOSPITAL_BASED_OUTPATIENT_CLINIC_OR_DEPARTMENT_OTHER): Payer: BC Managed Care – PPO

## 2013-12-30 VITALS — BP 117/83 | HR 95 | Temp 97.9°F | Resp 16

## 2013-12-30 DIAGNOSIS — D649 Anemia, unspecified: Secondary | ICD-10-CM | POA: Diagnosis not present

## 2013-12-30 DIAGNOSIS — C541 Malignant neoplasm of endometrium: Secondary | ICD-10-CM

## 2013-12-30 MED ORDER — SODIUM CHLORIDE 0.9 % IJ SOLN
10.0000 mL | INTRAMUSCULAR | Status: AC | PRN
  Administered 2013-12-30: 10 mL
  Filled 2013-12-30: qty 10

## 2013-12-30 MED ORDER — DIPHENHYDRAMINE HCL 25 MG PO CAPS
25.0000 mg | ORAL_CAPSULE | Freq: Once | ORAL | Status: AC
  Administered 2013-12-30: 25 mg via ORAL

## 2013-12-30 MED ORDER — DIPHENHYDRAMINE HCL 25 MG PO CAPS
ORAL_CAPSULE | ORAL | Status: AC
  Filled 2013-12-30: qty 1

## 2013-12-30 MED ORDER — HEPARIN SOD (PORK) LOCK FLUSH 100 UNIT/ML IV SOLN
500.0000 [IU] | Freq: Every day | INTRAVENOUS | Status: AC | PRN
  Administered 2013-12-30: 500 [IU]
  Filled 2013-12-30: qty 5

## 2013-12-30 MED ORDER — SODIUM CHLORIDE 0.9 % IV SOLN
250.0000 mL | Freq: Once | INTRAVENOUS | Status: AC
  Administered 2013-12-30: 250 mL via INTRAVENOUS

## 2013-12-30 MED ORDER — ACETAMINOPHEN 325 MG PO TABS
ORAL_TABLET | ORAL | Status: AC
  Filled 2013-12-30: qty 2

## 2013-12-30 MED ORDER — ACETAMINOPHEN 325 MG PO TABS
650.0000 mg | ORAL_TABLET | Freq: Once | ORAL | Status: AC
  Administered 2013-12-30: 650 mg via ORAL

## 2013-12-30 NOTE — Patient Instructions (Signed)

## 2013-12-31 ENCOUNTER — Telehealth: Payer: Self-pay | Admitting: *Deleted

## 2013-12-31 DIAGNOSIS — C549 Malignant neoplasm of corpus uteri, unspecified: Secondary | ICD-10-CM

## 2013-12-31 LAB — TYPE AND SCREEN
ABO/RH(D): O POS
ANTIBODY SCREEN: NEGATIVE
Unit division: 0

## 2013-12-31 MED ORDER — DEXAMETHASONE 4 MG PO TABS: ORAL_TABLET | ORAL | Status: DC

## 2013-12-31 NOTE — Telephone Encounter (Signed)
Received VM from patient asking how many more chemo treatments she is going to have. Called patient back and advised her that per Dr. Mariana Kaufman last office note she is set up on Friday, 01/03/14, for labs, office visit and treatment starting at 8:45. Told patient that Dr. Marko Plume will review future treatments at her visit. Patient agreeable to this. She also states she is out of the decadron tablets - told her there are refills remaining at [facility] outpatient pharmacy. She states her voucher to help with costs of prescriptions has run out and would like the tablets sent to her [facility] on [address]. Prescription sent for patient.

## 2014-01-03 ENCOUNTER — Other Ambulatory Visit (HOSPITAL_BASED_OUTPATIENT_CLINIC_OR_DEPARTMENT_OTHER): Payer: BC Managed Care – PPO

## 2014-01-03 ENCOUNTER — Encounter: Payer: Self-pay | Admitting: Oncology

## 2014-01-03 ENCOUNTER — Ambulatory Visit: Payer: BC Managed Care – PPO

## 2014-01-03 ENCOUNTER — Ambulatory Visit (HOSPITAL_BASED_OUTPATIENT_CLINIC_OR_DEPARTMENT_OTHER): Payer: BC Managed Care – PPO | Admitting: Oncology

## 2014-01-03 ENCOUNTER — Other Ambulatory Visit: Payer: BC Managed Care – PPO

## 2014-01-03 ENCOUNTER — Ambulatory Visit (HOSPITAL_BASED_OUTPATIENT_CLINIC_OR_DEPARTMENT_OTHER): Payer: BC Managed Care – PPO

## 2014-01-03 VITALS — BP 127/84 | HR 104 | Temp 97.5°F | Resp 18 | Ht 64.0 in | Wt 229.5 lb

## 2014-01-03 DIAGNOSIS — Z95828 Presence of other vascular implants and grafts: Secondary | ICD-10-CM

## 2014-01-03 DIAGNOSIS — C541 Malignant neoplasm of endometrium: Secondary | ICD-10-CM

## 2014-01-03 DIAGNOSIS — E876 Hypokalemia: Secondary | ICD-10-CM

## 2014-01-03 DIAGNOSIS — Z5111 Encounter for antineoplastic chemotherapy: Secondary | ICD-10-CM

## 2014-01-03 DIAGNOSIS — D649 Anemia, unspecified: Secondary | ICD-10-CM

## 2014-01-03 DIAGNOSIS — Z720 Tobacco use: Secondary | ICD-10-CM

## 2014-01-03 LAB — CBC WITH DIFFERENTIAL/PLATELET
BASO%: 0.1 % (ref 0.0–2.0)
BASOS ABS: 0 10*3/uL (ref 0.0–0.1)
EOS ABS: 0 10*3/uL (ref 0.0–0.5)
EOS%: 0 % (ref 0.0–7.0)
HEMATOCRIT: 29.2 % — AB (ref 34.8–46.6)
HEMOGLOBIN: 9.4 g/dL — AB (ref 11.6–15.9)
LYMPH#: 1.9 10*3/uL (ref 0.9–3.3)
LYMPH%: 20.1 % (ref 14.0–49.7)
MCH: 26.8 pg (ref 25.1–34.0)
MCHC: 32.2 g/dL (ref 31.5–36.0)
MCV: 83.2 fL (ref 79.5–101.0)
MONO#: 0.4 10*3/uL (ref 0.1–0.9)
MONO%: 3.8 % (ref 0.0–14.0)
NEUT%: 76 % (ref 38.4–76.8)
NEUTROS ABS: 7 10*3/uL — AB (ref 1.5–6.5)
Platelets: 205 10*3/uL (ref 145–400)
RBC: 3.51 10*6/uL — ABNORMAL LOW (ref 3.70–5.45)
RDW: 24.8 % — ABNORMAL HIGH (ref 11.2–14.5)
WBC: 9.2 10*3/uL (ref 3.9–10.3)

## 2014-01-03 LAB — COMPREHENSIVE METABOLIC PANEL (CC13)
ALT: 14 U/L (ref 0–55)
ANION GAP: 10 meq/L (ref 3–11)
AST: 17 U/L (ref 5–34)
Albumin: 3.7 g/dL (ref 3.5–5.0)
Alkaline Phosphatase: 79 U/L (ref 40–150)
BUN: 23.9 mg/dL (ref 7.0–26.0)
CALCIUM: 9.9 mg/dL (ref 8.4–10.4)
CHLORIDE: 105 meq/L (ref 98–109)
CO2: 22 mEq/L (ref 22–29)
CREATININE: 0.9 mg/dL (ref 0.6–1.1)
GLUCOSE: 121 mg/dL (ref 70–140)
Potassium: 4.3 mEq/L (ref 3.5–5.1)
Sodium: 137 mEq/L (ref 136–145)
Total Bilirubin: 0.22 mg/dL (ref 0.20–1.20)
Total Protein: 7.8 g/dL (ref 6.4–8.3)

## 2014-01-03 MED ORDER — HEPARIN SOD (PORK) LOCK FLUSH 100 UNIT/ML IV SOLN
500.0000 [IU] | Freq: Once | INTRAVENOUS | Status: AC | PRN
  Administered 2014-01-03: 500 [IU]
  Filled 2014-01-03: qty 5

## 2014-01-03 MED ORDER — FAMOTIDINE IN NACL 20-0.9 MG/50ML-% IV SOLN
INTRAVENOUS | Status: AC
  Filled 2014-01-03: qty 50

## 2014-01-03 MED ORDER — FAMOTIDINE IN NACL 20-0.9 MG/50ML-% IV SOLN
20.0000 mg | Freq: Once | INTRAVENOUS | Status: AC
  Administered 2014-01-03: 20 mg via INTRAVENOUS

## 2014-01-03 MED ORDER — ONDANSETRON 8 MG/NS 50 ML IVPB
INTRAVENOUS | Status: AC
  Filled 2014-01-03: qty 8

## 2014-01-03 MED ORDER — PACLITAXEL CHEMO INJECTION 300 MG/50ML
80.0000 mg/m2 | Freq: Once | INTRAVENOUS | Status: AC
  Administered 2014-01-03: 168 mg via INTRAVENOUS
  Filled 2014-01-03: qty 28

## 2014-01-03 MED ORDER — SODIUM CHLORIDE 0.9 % IJ SOLN
10.0000 mL | INTRAMUSCULAR | Status: DC | PRN
  Administered 2014-01-03: 10 mL
  Filled 2014-01-03: qty 10

## 2014-01-03 MED ORDER — DEXAMETHASONE SODIUM PHOSPHATE 20 MG/5ML IJ SOLN
INTRAMUSCULAR | Status: AC
  Filled 2014-01-03: qty 5

## 2014-01-03 MED ORDER — DIPHENHYDRAMINE HCL 50 MG/ML IJ SOLN
INTRAMUSCULAR | Status: AC
  Filled 2014-01-03: qty 1

## 2014-01-03 MED ORDER — DIPHENHYDRAMINE HCL 50 MG/ML IJ SOLN
50.0000 mg | Freq: Once | INTRAMUSCULAR | Status: AC
  Administered 2014-01-03: 50 mg via INTRAVENOUS

## 2014-01-03 MED ORDER — DEXAMETHASONE SODIUM PHOSPHATE 20 MG/5ML IJ SOLN
20.0000 mg | Freq: Once | INTRAMUSCULAR | Status: AC
  Administered 2014-01-03: 20 mg via INTRAVENOUS

## 2014-01-03 MED ORDER — ONDANSETRON 8 MG/50ML IVPB (CHCC)
8.0000 mg | Freq: Once | INTRAVENOUS | Status: AC
  Administered 2014-01-03: 8 mg via INTRAVENOUS

## 2014-01-03 MED ORDER — SODIUM CHLORIDE 0.9 % IJ SOLN
10.0000 mL | INTRAMUSCULAR | Status: DC | PRN
  Administered 2014-01-03: 10 mL via INTRAVENOUS
  Filled 2014-01-03: qty 10

## 2014-01-03 MED ORDER — SODIUM CHLORIDE 0.9 % IV SOLN
Freq: Once | INTRAVENOUS | Status: AC
  Administered 2014-01-03: 11:00:00 via INTRAVENOUS

## 2014-01-03 NOTE — Patient Instructions (Signed)

## 2014-01-03 NOTE — Patient Instructions (Signed)
[place] [facility] Discharge Instructions for Patients Receiving Chemotherapy  Today you received the following chemotherapy agents: Taxol.  To help prevent nausea and vomiting after your treatment, we encourage you to take your nausea medication: Zofran 8mg  every 8 hours.   If you develop nausea and vomiting that is not controlled by your nausea medication, call the clinic.   BELOW ARE SYMPTOMS THAT SHOULD BE REPORTED IMMEDIATELY:  *FEVER GREATER THAN 100.5 F  *CHILLS WITH OR WITHOUT FEVER  NAUSEA AND VOMITING THAT IS NOT CONTROLLED WITH YOUR NAUSEA MEDICATION  *UNUSUAL SHORTNESS OF BREATH  *UNUSUAL BRUISING OR BLEEDING  TENDERNESS IN MOUTH AND THROAT WITH OR WITHOUT PRESENCE OF ULCERS  *URINARY PROBLEMS  *BOWEL PROBLEMS  UNUSUAL RASH Items with * indicate a potential emergency and should be followed up as soon as possible.  Feel free to call the clinic you have any questions or concerns. The clinic phone number is [phone].

## 2014-01-03 NOTE — Progress Notes (Signed)
OFFICE PROGRESS NOTE   01/03/2014   Physicians:Emma Nada Maclachlan, MD , Aloha Gell   INTERVAL HISTORY:  Patient is seen, alone for visit, in continuing attention to neoadjuvant dose dense carboplatin and taxol, due day 15 cycle 3 today. Dr Denman George plans surgery on 01-21-14, so that we will hold chemo after today until following surgery.  Day 8 cycle 3 was delayed to have PAC placed. Patient has felt less fatigued (and "less cold") since 1 unit PRBCs on 12-30-13, and otherwise continues to tolerated the chemotherapy well. No nausea, good appetite, no abdominal discomfort or swelling, no bleeding, no significant peripheral neuropathy.   She has PAC, placed by IR 12-25-13.  She has had flu vaccine  65 yo granddaughter in [place] just diagnosed with MS, which patient has talked about at length.   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 14cm adnexal mass.     Endometrial cancer determined by uterine biopsy   09/16/2013 Initial Diagnosis Endometrial cancer determined by uterine biopsy, and endocervical biopsy  Patient had been in usual good health until acute episode of abdominal pain in Dec 2014, which eventually resolved without medical attention. More recently she has had vague diffuse abdominal discomfort, low grade nausea and abdominal bloating. She was seen in June 2015 for first gyn exam in years, PAP with AGUS and follow up biopsies of endocervix and endometrium by Dr Pamala Hurry both with serous endometrial cancer (pathology from [facility] 09-12-13, case # [id number] to be scanned into this EMR). Biopsy of cervix had normal squamous epithelium. CA125 from [facility] 09-16-13 was 964. She was seen by Dr Denman George on 09-23-13, her exam remarkable for large pelvic mass minimally mobile and extending to umbilicus. She had CT CAP 09-26-13 had prominent but not enlarged juxtapericardiac lymph nodes,  trace right pleural effusion, large amount of abnormal soft tissue thruout peritoneal cavity, predominately with omentum and especially LUQ, largest area 3.8 x 3.0 x 4.0 cm anterior to proximal descending colon, implants adjacent to liver, large complex multicystic lesion in pelvis inseperable from uterus and adnexae, with mass effect on bladder, questionable area in central aspect of dome of liver, borderline enlarged retroperitoneal nodes. Due to extent of disease on CT, Dr Denman George has recommended that treatment begin with chemotherapy, with consideration of interval debulking surgery depending on response to systemic treatment. Cycle one taxol carboplatin was given on 8-92-11, complicated by severe nausea/vomiting, constipation and severe taxol aches. Cycle 2 was changed to dose dense carbo taxol, day 1 on 11-01-13. She needed gCSF support by cycle 3.   Review of systems as above, also: PAC has functioned well. No fever or symptoms of infection. No SOB at rest. No LE swelling. Able to sleep. Remainder of 10 point Review of Systems negative.  Objective:  Vital signs in last 24 hours:  BP 127/84  Pulse 104  Temp(Src) 97.5 F (36.4 C) (Oral)  Resp 18  Ht 5\' 4"  (1.626 m)  Wt 229 lb 8 oz (104.101 kg)  BMI 39.37 kg/m2 weight is up 7 lbs.  Alert, oriented and appropriate. Talkative and in good spirits (also on premed steroids), respirations not labored RA.  Ambulatory without difficulty.  Alopecia  HEENT:PERRL, sclerae not icteric. Oral mucosa moist without lesions, posterior pharynx clear.  Neck supple. No JVD.  Lymphatics:no cervical,suraclavicular adenopathy Resp: clear to auscultation bilaterally and normal percussion bilaterally Cardio: regular rate and rhythm. No gallop. GI: abdomen obese, soft,  nontender, not distended, no mass or organomegaly. Normally active bowel sounds. Surgical incision not remarkable. Musculoskeletal/ Extremities: without pitting edema, cords, tenderness Neuro: no  peripheral neuropathy. Otherwise nonfocal. PSYCH appropriate mood and affect. Skin without rash, ecchymosis, petechiae Portacath-without erythema or tenderness, surgical glue nearly gone, no ecchymosis  Lab Results:  Results for orders placed in visit on 01/03/14  CBC WITH DIFFERENTIAL      Result Value Ref Range   WBC 9.2  3.9 - 10.3 10e3/uL   NEUT# 7.0 (*) 1.5 - 6.5 10e3/uL   HGB 9.4 (*) 11.6 - 15.9 g/dL   HCT 29.2 (*) 34.8 - 46.6 %   Platelets 205  145 - 400 10e3/uL   MCV 83.2  79.5 - 101.0 fL   MCH 26.8  25.1 - 34.0 pg   MCHC 32.2  31.5 - 36.0 g/dL   RBC 3.51 (*) 3.70 - 5.45 10e6/uL   RDW 24.8 (*) 11.2 - 14.5 %   lymph# 1.9  0.9 - 3.3 10e3/uL   MONO# 0.4  0.1 - 0.9 10e3/uL   Eosinophils Absolute 0.0  0.0 - 0.5 10e3/uL   Basophils Absolute 0.0  0.0 - 0.1 10e3/uL   NEUT% 76.0  38.4 - 76.8 %   LYMPH% 20.1  14.0 - 49.7 %   MONO% 3.8  0.0 - 14.0 %   EOS% 0.0  0.0 - 7.0 %   BASO% 0.1  0.0 - 2.0 %  COMPREHENSIVE METABOLIC PANEL (KW40)      Result Value Ref Range   Sodium 137  136 - 145 mEq/L   Potassium 4.3  3.5 - 5.1 mEq/L   Chloride 105  98 - 109 mEq/L   CO2 22  22 - 29 mEq/L   Glucose 121  70 - 140 mg/dl   BUN 23.9  7.0 - 26.0 mg/dL   Creatinine 0.9  0.6 - 1.1 mg/dL   Total Bilirubin 0.22  0.20 - 1.20 mg/dL   Alkaline Phosphatase 79  40 - 150 U/L   AST 17  5 - 34 U/L   ALT 14  0 - 55 U/L   Total Protein 7.8  6.4 - 8.3 g/dL   Albumin 3.7  3.5 - 5.0 g/dL   Calcium 9.9  8.4 - 10.4 mg/dL   Anion Gap 10  3 - 11 mEq/L    CA 125 available after visit by previous lab method down to 47 from 1405 in July. By new lab method this is 12, doen from 101 on 12-04-13.  Studies/Results:  No results found.  Medications: I have reviewed the patient's current medications. She will try carafate just prn as no indigestion now; is not presently taking protonix. Continue K.   DISCUSSION: she understands that we expect she will need additional chemo after surgery.   Assessment/Plan: 1.  Clinical stage IV serous endometrial carcinoma:Tolerating dose dense regimen well, clinically much improved. Using gCSF day after each chemo. Delay treatment from today to next week for [facility] placement. Will complete present cycle, then surgery by gyn oncology Oct 20. Expect additional chemo will be appropriate after surgery.  2. Peripheral IV access no longer adequate for chemo, with skin irritation right forearm since last treatment. PAC by IR requested. Continue moisturizing cream to arm and follow.  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 HTN: Resumed lisinopril HCTZ , follow BP  7.constipation: controlled with present laxatives.  8.mild hypokalemia: improved on KCl 10  mEq daily, seems related to HCTZ  9. Anemia: mutlifactorial, post transfusion 2 units PRBCs 7-17 and continuing oral iron. May need PRBCs around surgery.  10.reported small volume hematemesis after several episodes of vomiting day 2-3 of cycle 1. None since and epigastric discomfort much better on protonix initially and now carafate, which she will continue. GERD preceded chemotherapy.  11.allergic sinusitis and vertigo: nearly resolved  12.flu vaccine given   Appointments to be changed as no chemo after today until after 01-21-14 surgery, per my communication with gyn oncology prior to today's visit. I will see her back with labs ~ 2-3 weeks after surgery and will set up additional chemo from there. She knows to call if needed prior to scheduled appointments.     LIVESAY,LENNIS P, MD   01/03/2014, 12:41 PM

## 2014-01-04 ENCOUNTER — Ambulatory Visit (HOSPITAL_BASED_OUTPATIENT_CLINIC_OR_DEPARTMENT_OTHER): Payer: BC Managed Care – PPO

## 2014-01-04 VITALS — BP 132/80 | HR 101 | Temp 98.2°F | Resp 18

## 2014-01-04 DIAGNOSIS — C541 Malignant neoplasm of endometrium: Secondary | ICD-10-CM

## 2014-01-04 DIAGNOSIS — Z5189 Encounter for other specified aftercare: Secondary | ICD-10-CM

## 2014-01-04 LAB — CA 125(PREVIOUS METHOD): CA 125: 47.1 U/mL — AB (ref 0.0–30.2)

## 2014-01-04 LAB — CA 125: CA 125: 70 U/mL — ABNORMAL HIGH (ref <35)

## 2014-01-04 MED ORDER — TBO-FILGRASTIM 300 MCG/0.5ML ~~LOC~~ SOSY
300.0000 ug | PREFILLED_SYRINGE | Freq: Once | SUBCUTANEOUS | Status: AC
  Administered 2014-01-04: 300 ug via SUBCUTANEOUS

## 2014-01-07 ENCOUNTER — Telehealth: Payer: Self-pay | Admitting: Oncology

## 2014-01-07 NOTE — Telephone Encounter (Signed)
per pof to CX pt RX & labs-cld & spoke to pt & gave date of nest sch 10/16-pt understood

## 2014-01-08 ENCOUNTER — Encounter (HOSPITAL_COMMUNITY): Payer: Self-pay | Admitting: Pharmacy Technician

## 2014-01-10 ENCOUNTER — Other Ambulatory Visit: Payer: BC Managed Care – PPO

## 2014-01-10 ENCOUNTER — Ambulatory Visit: Payer: BC Managed Care – PPO

## 2014-01-15 NOTE — Patient Instructions (Addendum)
Melissa Ball  01/15/2014                           YOUR PROCEDURE IS SCHEDULED ON:  01/21/14                ENTER FROM [address] - GO TO [address]ARKING DECK               LOOK FOR VALET [facility]ARKING  / [employer]S                              FOLLOW  SIGNS TO SHORT STAY CENTER                 ARRIVE AT SHORT STAY AT: 8:45 am               CALL THIS NUMBER IF ANY PROBLEMS THE DAY OF SURGERY :               [phone]                                REMEMBER:   Do not eat food or drink liquids AFTER MIDNIGHT                  Take these medicines the morning of surgery with               A SIPS OF WATER :   May take VICODIN for pain or LORAZAPAM if needed ( do not take both ) DO NOT TAKE IF YOU DON'T NEED THEM                                                 TAKE PANTOPROZOLE      Do not wear jewelry, make-up   Do not wear lotions, powders, or perfumes.   Do not shave legs or underarms 12 hrs. before surgery (men may shave face)  Do not bring valuables to the hospital.  Contacts, dentures or bridgework may not be worn into surgery.  Leave suitcase in the car. After surgery it may be brought to your room.  For patients admitted to the hospital more than one night, checkout time is            11:00 AM                                                      [place] Allowed                                                                     Foods Excluded  Coffee and tea, regular and decaf  liquids that you cannot  Plain Jell-O in any flavor                                             see through such as: Fruit ices (not with fruit pulp)                                     milk, soups, orange juice  Iced Popsicles                                    All solid food Carbonated beverages, regular and diet                                    Cranberry, grape and apple juices Sports drinks  like Gatorade Lightly seasoned clear broth or consume(fat free) Sugar, honey syrup  _____________________________________________________________________    ________________________________________________________________________                                                                                                  [place]  Before surgery, you can play an important role.  Because skin is not sterile, your skin needs to be as free of germs as possible.  You can reduce the number of germs on your skin by washing with CHG (chlorahexidine gluconate) soap before surgery.  CHG is an antiseptic cleaner which kills germs and bonds with the skin to continue killing germs even after washing. Please DO NOT use if you have an allergy to CHG or antibacterial soaps.  If your skin becomes reddened/irritated stop using the CHG and inform your nurse when you arrive at Short Stay. Do not shave (including legs and underarms) for at least 48 hours prior to the first CHG shower.  You may shave your face. Please follow these instructions carefully:   1.  Shower with CHG Soap the night before surgery and the  morning of Surgery.   2.  If you choose to wash your hair, wash your hair first as usual with your  normal  Shampoo.   3.  After you shampoo, rinse your hair and body thoroughly to remove the  shampoo.                                         4.  Use CHG as you would any other liquid soap.  You can apply chg directly  to the skin and wash . Gently wash with scrungie or clean wascloth    5.  Apply the CHG Soap to your body ONLY FROM THE NECK DOWN.   Do not use on open  Wound or open sores. Avoid contact with eyes, ears mouth and genitals (private parts).                        Genitals (private parts) with your normal soap.              6.  Wash thoroughly, paying special attention to the area where your surgery  will be performed.   7.   Thoroughly rinse your body with warm water from the neck down.   8.  DO NOT shower/wash with your normal soap after using and rinsing off  the CHG Soap .                9.  Pat yourself dry with a clean towel.             10.  Wear clean pajamas.             11.  Place clean sheets on your bed the night of your first shower and do not  sleep with pets.  Day of Surgery : Do not apply any lotions/deodorants the morning of surgery.  Please wear clean clothes to the hospital/surgery center.  FAILURE TO FOLLOW THESE INSTRUCTIONS MAY RESULT IN THE CANCELLATION OF YOUR SURGERY    PATIENT SIGNATURE_________________________________  ______________________________________________________________________    Adam Phenix  An incentive spirometer is a tool that can help keep your lungs clear and active. This tool measures how well you are filling your lungs with each breath. Taking long deep breaths may help reverse or decrease the chance of developing breathing (pulmonary) problems (especially infection) following:  A long period of time when you are unable to move or be active. BEFORE THE PROCEDURE   If the spirometer includes an indicator to show your best effort, your nurse or respiratory therapist will set it to a desired goal.  If possible, sit up straight or lean slightly forward. Try not to slouch.  Hold the incentive spirometer in an upright position. INSTRUCTIONS FOR USE  1. Sit on the edge of your bed if possible, or sit up as far as you can in bed or on a chair. 2. Hold the incentive spirometer in an upright position. 3. Breathe out normally. 4. Place the mouthpiece in your mouth and seal your lips tightly around it. 5. Breathe in slowly and as deeply as possible, raising the piston or the ball toward the top of the column. 6. Hold your breath for 3-5 seconds or for as long as possible. Allow the piston or ball to fall to the bottom of the column. 7. Remove the  mouthpiece from your mouth and breathe out normally. 8. Rest for a few seconds and repeat Steps 1 through 7 at least 10 times every 1-2 hours when you are awake. Take your time and take a few normal breaths between deep breaths. 9. The spirometer may include an indicator to show your best effort. Use the indicator as a goal to work toward during each repetition. 10. After each set of 10 deep breaths, practice coughing to be sure your lungs are clear. If you have an incision (the cut made at the time of surgery), support your incision when coughing by placing a pillow or rolled up towels firmly against it. Once you are able to get out of bed, walk around indoors and cough well. You may stop using the incentive spirometer when instructed by your caregiver.  RISKS AND COMPLICATIONS  Take  your time so you do not get dizzy or light-headed.  If you are in pain, you may need to take or ask for pain medication before doing incentive spirometry. It is harder to take a deep breath if you are having pain. AFTER USE  Rest and breathe slowly and easily.  It can be helpful to keep track of a log of your progress. Your caregiver can provide you with a simple table to help with this. If you are using the spirometer at home, follow these instructions: [place] IF:   You are having difficultly using the spirometer.  You have trouble using the spirometer as often as instructed.  Your pain medication is not giving enough relief while using the spirometer.  You develop fever of 100.5 F (38.1 C) or higher. SEEK IMMEDIATE MEDICAL CARE IF:   You cough up bloody sputum that had not been present before.  You develop fever of 102 F (38.9 C) or greater.  You develop worsening pain at or near the incision site. MAKE SURE YOU:   Understand these instructions.  Will watch your condition.  Will get help right away if you are not doing well or get worse. Document Released: 08/01/2006 Document  Revised: 06/13/2011 Document Reviewed: 10/02/2006 ExitCare Patient Information 2014 Exit[employer], [place].   ________________________________________________________________________  WHAT IS A BLOOD TRANSFUSION? Blood Transfusion Information  A transfusion is the replacement of blood or some of its parts. Blood is made up of multiple cells which provide different functions.  Red blood cells carry oxygen and are used for blood loss replacement.  White blood cells fight against infection.  Platelets control bleeding.  Plasma helps clot blood.  Other blood products are available for specialized needs, such as hemophilia or other clotting disorders. BEFORE THE TRANSFUSION  Who gives blood for transfusions?   Healthy volunteers who are fully evaluated to make sure their blood is safe. This is blood bank blood. Transfusion therapy is the safest it has ever been in the practice of medicine. Before blood is taken from a donor, a complete history is taken to make sure that person has no history of diseases nor engages in risky social behavior (examples are intravenous drug use or sexual activity with multiple partners). The donor's travel history is screened to minimize risk of transmitting infections, such as malaria. The donated blood is tested for signs of infectious diseases, such as HIV and hepatitis. The blood is then tested to be sure it is compatible with you in order to minimize the chance of a transfusion reaction. If you or a relative donates blood, this is often done in anticipation of surgery and is not appropriate for emergency situations. It takes many days to process the donated blood. RISKS AND COMPLICATIONS Although transfusion therapy is very safe and saves many lives, the main dangers of transfusion include:   Getting an infectious disease.  Developing a transfusion reaction. This is an allergic reaction to something in the blood you were given. Every precaution is taken to prevent  this. The decision to have a blood transfusion has been considered carefully by your caregiver before blood is given. Blood is not given unless the benefits outweigh the risks. AFTER THE TRANSFUSION  Right after receiving a blood transfusion, you will usually feel much better and more energetic. This is especially true if your red blood cells have gotten low (anemic). The transfusion raises the level of the red blood cells which carry oxygen, and this usually causes an energy increase.  The nurse administering the transfusion will monitor you carefully for complications. HOME CARE INSTRUCTIONS  No special instructions are needed after a transfusion. You may find your energy is better. Speak with your caregiver about any limitations on activity for underlying diseases you may have. SEEK MEDICAL CARE IF:   Your condition is not improving after your transfusion.  You develop redness or irritation at the intravenous (IV) site. SEEK IMMEDIATE MEDICAL CARE IF:  Any of the following symptoms occur over the next 12 hours:  Shaking chills.  You have a temperature by mouth above 102 F (38.9 C), not controlled by medicine.  Chest, back, or muscle pain.  People around you feel you are not acting correctly or are confused.  Shortness of breath or difficulty breathing.  Dizziness and fainting.  You get a rash or develop hives.  You have a decrease in urine output.  Your urine turns a dark color or changes to pink, red, or brown. Any of the following symptoms occur over the next 10 days:  You have a temperature by mouth above 102 F (38.9 C), not controlled by medicine.  Shortness of breath.  Weakness after normal activity.  The white part of the eye turns yellow (jaundice).  You have a decrease in the amount of urine or are urinating less often.  Your urine turns a dark color or changes to pink, red, or brown. Document Released: 03/18/2000 Document Revised: 06/13/2011 Document  Reviewed: 11/05/2007 [facility] Patient Information 2014 [place], [place].  _______________________________________________________________________

## 2014-01-15 NOTE — Progress Notes (Signed)
Need orders in epic please - pt coming for preop fri 01/17/14 - thank you

## 2014-01-16 ENCOUNTER — Telehealth: Payer: Self-pay | Admitting: *Deleted

## 2014-01-16 NOTE — Telephone Encounter (Signed)
Received VM from [facility] in pharmacy stating that pt is still scheduled for chemo tomorrow but Dr. Mariana Kaufman last office note said to hold chemo until after surgery. Called Lattie Haw and let her know that pt will not be getting chemo tomorrow. Lab, flush, and chemo appt canceled and I called patient to let her know so she wouldn't come for appt. Pt states she is aware that Dr. Marko Plume is hold chemo until after surgery.

## 2014-01-17 ENCOUNTER — Encounter (HOSPITAL_COMMUNITY): Payer: Self-pay

## 2014-01-17 ENCOUNTER — Encounter (HOSPITAL_COMMUNITY)
Admission: RE | Admit: 2014-01-17 | Discharge: 2014-01-17 | Disposition: A | Discharge status: Home / Self Care | Payer: BC Managed Care – PPO | Source: Ambulatory Visit | Attending: Gynecologic Oncology | Admitting: Gynecologic Oncology

## 2014-01-17 ENCOUNTER — Ambulatory Visit: Payer: BC Managed Care – PPO

## 2014-01-17 ENCOUNTER — Ambulatory Visit (HOSPITAL_COMMUNITY)
Admission: RE | Admit: 2014-01-17 | Discharge: 2014-01-17 | Disposition: A | Discharge status: Home / Self Care | Payer: BC Managed Care – PPO | Source: Ambulatory Visit | Attending: Gynecologic Oncology | Admitting: Gynecologic Oncology

## 2014-01-17 ENCOUNTER — Other Ambulatory Visit: Payer: BC Managed Care – PPO

## 2014-01-17 DIAGNOSIS — C541 Malignant neoplasm of endometrium: Secondary | ICD-10-CM | POA: Insufficient documentation

## 2014-01-17 DIAGNOSIS — Z01818 Encounter for other preprocedural examination: Secondary | ICD-10-CM | POA: Diagnosis not present

## 2014-01-17 HISTORY — DX: Unspecified osteoarthritis, unspecified site: M19.90

## 2014-01-17 HISTORY — DX: Gastro-esophageal reflux disease without esophagitis: K21.9

## 2014-01-17 HISTORY — DX: Personal history of other medical treatment: Z92.89

## 2014-01-17 LAB — URINE MICROSCOPIC-ADD ON

## 2014-01-17 LAB — CBC WITH DIFFERENTIAL/PLATELET
Basophils Absolute: 0 10*3/uL (ref 0.0–0.1)
Basophils Relative: 0 % (ref 0–1)
EOS PCT: 1 % (ref 0–5)
Eosinophils Absolute: 0.1 10*3/uL (ref 0.0–0.7)
HCT: 28 % — ABNORMAL LOW (ref 36.0–46.0)
Hemoglobin: 9.1 g/dL — ABNORMAL LOW (ref 12.0–15.0)
Lymphocytes Relative: 30 % (ref 12–46)
Lymphs Abs: 2.2 10*3/uL (ref 0.7–4.0)
MCH: 27.7 pg (ref 26.0–34.0)
MCHC: 32.5 g/dL (ref 30.0–36.0)
MCV: 85.4 fL (ref 78.0–100.0)
MONO ABS: 0.7 10*3/uL (ref 0.1–1.0)
MONOS PCT: 10 % (ref 3–12)
NEUTROS PCT: 59 % (ref 43–77)
Neutro Abs: 4.3 10*3/uL (ref 1.7–7.7)
PLATELETS: 185 10*3/uL (ref 150–400)
RBC: 3.28 MIL/uL — ABNORMAL LOW (ref 3.87–5.11)
RDW: 22.5 % — AB (ref 11.5–15.5)
WBC: 7.3 10*3/uL (ref 4.0–10.5)

## 2014-01-17 LAB — URINALYSIS, ROUTINE W REFLEX MICROSCOPIC
Bilirubin Urine: NEGATIVE
Glucose, UA: NEGATIVE mg/dL
Ketones, ur: NEGATIVE mg/dL
Leukocytes, UA: NEGATIVE
Nitrite: NEGATIVE
Protein, ur: NEGATIVE mg/dL
Specific Gravity, Urine: 1.017 (ref 1.005–1.030)
Urobilinogen, UA: 0.2 mg/dL (ref 0.0–1.0)
pH: 5.5 (ref 5.0–8.0)

## 2014-01-17 LAB — COMPREHENSIVE METABOLIC PANEL
ALT: 11 U/L (ref 0–35)
AST: 17 U/L (ref 0–37)
Albumin: 3.5 g/dL (ref 3.5–5.2)
Alkaline Phosphatase: 72 U/L (ref 39–117)
Anion gap: 13 (ref 5–15)
BUN: 23 mg/dL (ref 6–23)
CO2: 26 meq/L (ref 19–32)
Calcium: 10 mg/dL (ref 8.4–10.5)
Chloride: 106 mEq/L (ref 96–112)
Creatinine, Ser: 0.8 mg/dL (ref 0.50–1.10)
GFR calc Af Amer: 88 mL/min — ABNORMAL LOW (ref >90)
GFR, EST NON AFRICAN AMERICAN: 76 mL/min — AB (ref >90)
Glucose, Bld: 93 mg/dL (ref 70–99)
Potassium: 4 mEq/L (ref 3.7–5.3)
SODIUM: 145 meq/L (ref 137–147)
TOTAL PROTEIN: 7.3 g/dL (ref 6.0–8.3)
Total Bilirubin: <0.2 mg/dL — ABNORMAL LOW (ref 0.3–1.2)

## 2014-01-17 NOTE — Progress Notes (Signed)
Abnormal CBC faxed to Joylene John NP

## 2014-01-21 ENCOUNTER — Ambulatory Visit (HOSPITAL_COMMUNITY): Payer: BC Managed Care – PPO | Admitting: Anesthesiology

## 2014-01-21 ENCOUNTER — Inpatient Hospital Stay (HOSPITAL_COMMUNITY)
Admission: RE | Admit: 2014-01-21 | Discharge: 2014-01-23 | DRG: 737 | Disposition: A | Discharge status: Home / Self Care | Payer: BC Managed Care – PPO | Source: Ambulatory Visit | Attending: Gynecologic Oncology | Admitting: Gynecologic Oncology

## 2014-01-21 ENCOUNTER — Encounter (HOSPITAL_COMMUNITY): Payer: BC Managed Care – PPO | Admitting: Anesthesiology

## 2014-01-21 ENCOUNTER — Encounter (HOSPITAL_COMMUNITY)
Admission: RE | Disposition: A | Discharge status: Home / Self Care | Payer: Self-pay | Source: Ambulatory Visit | Attending: Gynecologic Oncology

## 2014-01-21 ENCOUNTER — Encounter (HOSPITAL_COMMUNITY): Payer: Self-pay | Admitting: Anesthesiology

## 2014-01-21 DIAGNOSIS — Z90710 Acquired absence of both cervix and uterus: Secondary | ICD-10-CM

## 2014-01-21 DIAGNOSIS — N138 Other obstructive and reflux uropathy: Secondary | ICD-10-CM | POA: Diagnosis present

## 2014-01-21 DIAGNOSIS — I10 Essential (primary) hypertension: Secondary | ICD-10-CM | POA: Diagnosis present

## 2014-01-21 DIAGNOSIS — C541 Malignant neoplasm of endometrium: Secondary | ICD-10-CM

## 2014-01-21 DIAGNOSIS — F17210 Nicotine dependence, cigarettes, uncomplicated: Secondary | ICD-10-CM | POA: Diagnosis present

## 2014-01-21 DIAGNOSIS — Z9079 Acquired absence of other genital organ(s): Secondary | ICD-10-CM

## 2014-01-21 DIAGNOSIS — C787 Secondary malignant neoplasm of liver and intrahepatic bile duct: Secondary | ICD-10-CM | POA: Diagnosis present

## 2014-01-21 DIAGNOSIS — Z6839 Body mass index (BMI) 39.0-39.9, adult: Secondary | ICD-10-CM

## 2014-01-21 DIAGNOSIS — C7982 Secondary malignant neoplasm of genital organs: Secondary | ICD-10-CM | POA: Diagnosis present

## 2014-01-21 DIAGNOSIS — Z808 Family history of malignant neoplasm of other organs or systems: Secondary | ICD-10-CM

## 2014-01-21 DIAGNOSIS — Z1504 Genetic susceptibility to malignant neoplasm of endometrium: Secondary | ICD-10-CM

## 2014-01-21 DIAGNOSIS — C786 Secondary malignant neoplasm of retroperitoneum and peritoneum: Secondary | ICD-10-CM | POA: Diagnosis present

## 2014-01-21 DIAGNOSIS — Z803 Family history of malignant neoplasm of breast: Secondary | ICD-10-CM

## 2014-01-21 DIAGNOSIS — Z801 Family history of malignant neoplasm of trachea, bronchus and lung: Secondary | ICD-10-CM

## 2014-01-21 DIAGNOSIS — E785 Hyperlipidemia, unspecified: Secondary | ICD-10-CM | POA: Diagnosis present

## 2014-01-21 DIAGNOSIS — D62 Acute posthemorrhagic anemia: Secondary | ICD-10-CM | POA: Diagnosis not present

## 2014-01-21 DIAGNOSIS — Z800 Family history of malignant neoplasm of digestive organs: Secondary | ICD-10-CM

## 2014-01-21 DIAGNOSIS — R000 Tachycardia, unspecified: Secondary | ICD-10-CM | POA: Diagnosis present

## 2014-01-21 DIAGNOSIS — C569 Malignant neoplasm of unspecified ovary: Secondary | ICD-10-CM | POA: Diagnosis present

## 2014-01-21 DIAGNOSIS — Z90722 Acquired absence of ovaries, bilateral: Secondary | ICD-10-CM

## 2014-01-21 DIAGNOSIS — Z9221 Personal history of antineoplastic chemotherapy: Secondary | ICD-10-CM

## 2014-01-21 DIAGNOSIS — N736 Female pelvic peritoneal adhesions (postinfective): Secondary | ICD-10-CM | POA: Diagnosis present

## 2014-01-21 DIAGNOSIS — K219 Gastro-esophageal reflux disease without esophagitis: Secondary | ICD-10-CM | POA: Diagnosis present

## 2014-01-21 DIAGNOSIS — C562 Malignant neoplasm of left ovary: Principal | ICD-10-CM | POA: Diagnosis present

## 2014-01-21 HISTORY — PX: ROBOTIC ASSISTED TOTAL HYSTERECTOMY WITH BILATERAL SALPINGO OOPHERECTOMY: SHX6086

## 2014-01-21 SURGERY — ROBOTIC ASSISTED TOTAL HYSTERECTOMY WITH BILATERAL SALPINGO OOPHORECTOMY
Anesthesia: General

## 2014-01-21 MED ORDER — NEOSTIGMINE METHYLSULFATE 10 MG/10ML IV SOLN
INTRAVENOUS | Status: AC
  Filled 2014-01-21: qty 1

## 2014-01-21 MED ORDER — HYDROMORPHONE HCL 1 MG/ML IJ SOLN
0.2500 mg | INTRAMUSCULAR | Status: DC | PRN
  Administered 2014-01-21 (×2): 0.5 mg via INTRAVENOUS

## 2014-01-21 MED ORDER — GLYCOPYRROLATE 0.2 MG/ML IJ SOLN
INTRAMUSCULAR | Status: DC | PRN
  Administered 2014-01-21: 0.6 mg via INTRAVENOUS

## 2014-01-21 MED ORDER — LORAZEPAM 2 MG/ML IJ SOLN: 1.0000 mg | Freq: Once | INTRAMUSCULAR | Status: DC | PRN

## 2014-01-21 MED ORDER — MIDAZOLAM HCL 5 MG/5ML IJ SOLN
INTRAMUSCULAR | Status: DC | PRN
  Administered 2014-01-21: 2 mg via INTRAVENOUS

## 2014-01-21 MED ORDER — ONDANSETRON HCL 4 MG/2ML IJ SOLN: 4.0000 mg | Freq: Four times a day (QID) | INTRAMUSCULAR | Status: DC | PRN

## 2014-01-21 MED ORDER — GLYCOPYRROLATE 0.2 MG/ML IJ SOLN
INTRAMUSCULAR | Status: AC
  Filled 2014-01-21: qty 3

## 2014-01-21 MED ORDER — ENOXAPARIN SODIUM 40 MG/0.4ML ~~LOC~~ SOLN
40.0000 mg | SUBCUTANEOUS | Status: AC
  Administered 2014-01-21: 40 mg via SUBCUTANEOUS
  Filled 2014-01-21: qty 0.4

## 2014-01-21 MED ORDER — LORAZEPAM 2 MG/ML IJ SOLN: 1.0000 mg | Freq: Once | INTRAMUSCULAR | Status: AC | PRN

## 2014-01-21 MED ORDER — LIDOCAINE HCL (CARDIAC) 20 MG/ML IV SOLN
INTRAVENOUS | Status: AC
  Filled 2014-01-21: qty 5

## 2014-01-21 MED ORDER — PROMETHAZINE HCL 25 MG/ML IJ SOLN: 6.2500 mg | INTRAMUSCULAR | Status: DC | PRN

## 2014-01-21 MED ORDER — HYDROMORPHONE HCL 1 MG/ML IJ SOLN
INTRAMUSCULAR | Status: DC | PRN
  Administered 2014-01-21 (×2): 1 mg via INTRAVENOUS

## 2014-01-21 MED ORDER — LISINOPRIL-HYDROCHLOROTHIAZIDE 20-25 MG PO TABS: 1.0000 | ORAL_TABLET | Freq: Every morning | ORAL | Status: DC

## 2014-01-21 MED ORDER — CEFAZOLIN SODIUM-DEXTROSE 2-3 GM-% IV SOLR
INTRAVENOUS | Status: AC
  Filled 2014-01-21: qty 50

## 2014-01-21 MED ORDER — LISINOPRIL 20 MG PO TABS
20.0000 mg | ORAL_TABLET | Freq: Every day | ORAL | Status: DC
  Administered 2014-01-22: 20 mg via ORAL
  Filled 2014-01-21: qty 1

## 2014-01-21 MED ORDER — ROCURONIUM BROMIDE 100 MG/10ML IV SOLN
INTRAVENOUS | Status: DC | PRN
  Administered 2014-01-21 (×2): 10 mg via INTRAVENOUS
  Administered 2014-01-21: 40 mg via INTRAVENOUS

## 2014-01-21 MED ORDER — HYDROMORPHONE HCL 1 MG/ML IJ SOLN: 0.2500 mg | INTRAMUSCULAR | Status: DC | PRN

## 2014-01-21 MED ORDER — CEFAZOLIN SODIUM-DEXTROSE 2-3 GM-% IV SOLR
2.0000 g | INTRAVENOUS | Status: AC
  Administered 2014-01-21: 2 g via INTRAVENOUS

## 2014-01-21 MED ORDER — LABETALOL HCL 5 MG/ML IV SOLN
INTRAVENOUS | Status: DC | PRN
  Administered 2014-01-21: 5 mg via INTRAVENOUS

## 2014-01-21 MED ORDER — DEXAMETHASONE SODIUM PHOSPHATE 10 MG/ML IJ SOLN
INTRAMUSCULAR | Status: DC | PRN
  Administered 2014-01-21: 10 mg via INTRAVENOUS

## 2014-01-21 MED ORDER — MIDAZOLAM HCL 2 MG/2ML IJ SOLN
INTRAMUSCULAR | Status: AC
  Filled 2014-01-21: qty 2

## 2014-01-21 MED ORDER — NEOSTIGMINE METHYLSULFATE 10 MG/10ML IV SOLN
INTRAVENOUS | Status: DC | PRN
  Administered 2014-01-21: 5 mg via INTRAVENOUS

## 2014-01-21 MED ORDER — OXYCODONE-ACETAMINOPHEN 5-325 MG PO TABS: 1.0000 | ORAL_TABLET | ORAL | Status: DC | PRN

## 2014-01-21 MED ORDER — HYDROMORPHONE HCL 1 MG/ML IJ SOLN: 0.5000 mg | INTRAMUSCULAR | Status: DC | PRN

## 2014-01-21 MED ORDER — TRAMADOL HCL 50 MG PO TABS: 100.0000 mg | ORAL_TABLET | Freq: Four times a day (QID) | ORAL | Status: DC | PRN

## 2014-01-21 MED ORDER — SODIUM CHLORIDE 0.9 % IV SOLN
8.0000 mg | Freq: Once | INTRAVENOUS | Status: AC | PRN
  Filled 2014-01-21: qty 4

## 2014-01-21 MED ORDER — ENOXAPARIN SODIUM 40 MG/0.4ML ~~LOC~~ SOLN
40.0000 mg | SUBCUTANEOUS | Status: DC
  Administered 2014-01-22 – 2014-01-23 (×2): 40 mg via SUBCUTANEOUS
  Filled 2014-01-21 (×3): qty 0.4

## 2014-01-21 MED ORDER — ONDANSETRON HCL 4 MG/2ML IJ SOLN
INTRAMUSCULAR | Status: DC | PRN
  Administered 2014-01-21: 4 mg via INTRAVENOUS

## 2014-01-21 MED ORDER — ROCURONIUM BROMIDE 100 MG/10ML IV SOLN
INTRAVENOUS | Status: AC
  Filled 2014-01-21: qty 1

## 2014-01-21 MED ORDER — LIDOCAINE HCL (CARDIAC) 20 MG/ML IV SOLN
INTRAVENOUS | Status: DC | PRN
  Administered 2014-01-21: 100 mg via INTRAVENOUS

## 2014-01-21 MED ORDER — FENTANYL CITRATE 0.05 MG/ML IJ SOLN
INTRAMUSCULAR | Status: AC
Start: 2014-01-21 — End: 2014-01-21
  Filled 2014-01-21: qty 5

## 2014-01-21 MED ORDER — ATORVASTATIN CALCIUM 10 MG PO TABS
10.0000 mg | ORAL_TABLET | Freq: Every day | ORAL | Status: DC
  Administered 2014-01-21 – 2014-01-23 (×3): 10 mg via ORAL
  Filled 2014-01-21 (×3): qty 1

## 2014-01-21 MED ORDER — KCL IN DEXTROSE-NACL 20-5-0.45 MEQ/L-%-% IV SOLN
INTRAVENOUS | Status: DC
  Administered 2014-01-21: 16:00:00 via INTRAVENOUS
  Administered 2014-01-22: 75 mL via INTRAVENOUS
  Filled 2014-01-21 (×3): qty 1000

## 2014-01-21 MED ORDER — HYDROMORPHONE HCL 2 MG/ML IJ SOLN
INTRAMUSCULAR | Status: AC
  Filled 2014-01-21: qty 1

## 2014-01-21 MED ORDER — HYDROCHLOROTHIAZIDE 25 MG PO TABS
25.0000 mg | ORAL_TABLET | Freq: Every day | ORAL | Status: DC
  Administered 2014-01-22: 25 mg via ORAL
  Filled 2014-01-21: qty 1

## 2014-01-21 MED ORDER — PROMETHAZINE HCL 25 MG/ML IJ SOLN
6.2500 mg | INTRAMUSCULAR | Status: DC | PRN
  Administered 2014-01-21: 6.25 mg via INTRAVENOUS

## 2014-01-21 MED ORDER — OXYCODONE-ACETAMINOPHEN 5-325 MG PO TABS
1.0000 | ORAL_TABLET | ORAL | Status: DC | PRN
  Administered 2014-01-21: 2 via ORAL
  Filled 2014-01-21: qty 2

## 2014-01-21 MED ORDER — LACTATED RINGERS IV SOLN
INTRAVENOUS | Status: DC | PRN
  Administered 2014-01-21 (×2): via INTRAVENOUS

## 2014-01-21 MED ORDER — HYDROMORPHONE HCL 1 MG/ML IJ SOLN
INTRAMUSCULAR | Status: AC
  Filled 2014-01-21: qty 1

## 2014-01-21 MED ORDER — PROPOFOL 10 MG/ML IV BOLUS
INTRAVENOUS | Status: DC | PRN
  Administered 2014-01-21: 120 mg via INTRAVENOUS

## 2014-01-21 MED ORDER — FENTANYL CITRATE 0.05 MG/ML IJ SOLN
INTRAMUSCULAR | Status: DC | PRN
  Administered 2014-01-21 (×3): 50 ug via INTRAVENOUS
  Administered 2014-01-21: 100 ug via INTRAVENOUS

## 2014-01-21 MED ORDER — PROPOFOL 10 MG/ML IV BOLUS
INTRAVENOUS | Status: AC
  Filled 2014-01-21: qty 20

## 2014-01-21 MED ORDER — PROMETHAZINE HCL 25 MG/ML IJ SOLN
INTRAMUSCULAR | Status: AC
  Filled 2014-01-21: qty 1

## 2014-01-21 MED ORDER — SUCCINYLCHOLINE CHLORIDE 20 MG/ML IJ SOLN
INTRAMUSCULAR | Status: DC | PRN
  Administered 2014-01-21: 100 mg via INTRAVENOUS

## 2014-01-21 MED ORDER — ONDANSETRON HCL 4 MG PO TABS: 4.0000 mg | ORAL_TABLET | Freq: Four times a day (QID) | ORAL | Status: DC | PRN

## 2014-01-21 SURGICAL SUPPLY — 52 items
ADH SKN CLS APL DERMABOND .7 (GAUZE/BANDAGES/DRESSINGS) ×1
APL ESCP 34 STRL LF DISP (HEMOSTASIS) ×1
APPLICATOR SURGIFLO ENDO (HEMOSTASIS) ×1 IMPLANT
BAG SPEC RTRVL LRG 6X4 10 (ENDOMECHANICALS)
CABLE HIGH FREQUENCY MONO STRZ (ELECTRODE) ×2 IMPLANT
CHLORAPREP W/TINT 26ML (MISCELLANEOUS) ×2 IMPLANT
CORDS BIPOLAR (ELECTRODE) ×2 IMPLANT
COVER SURGICAL LIGHT HANDLE (MISCELLANEOUS) ×2 IMPLANT
COVER TIP SHEARS 8 DVNC (MISCELLANEOUS) ×1 IMPLANT
COVER TIP SHEARS 8MM DA VINCI (MISCELLANEOUS) ×1
DERMABOND ADVANCED (GAUZE/BANDAGES/DRESSINGS) ×1
DERMABOND ADVANCED .7 DNX12 (GAUZE/BANDAGES/DRESSINGS) ×1 IMPLANT
DRAPE SHEET LG 3/4 BI-LAMINATE (DRAPES) ×4 IMPLANT
DRAPE SURG IRRIG POUCH 19X23 (DRAPES) ×2 IMPLANT
DRAPE TABLE BACK 44X90 PK DISP (DRAPES) ×4 IMPLANT
DRAPE WARM FLUID 44X44 (DRAPE) ×2 IMPLANT
DRSG TEGADERM 6X8 (GAUZE/BANDAGES/DRESSINGS) ×4 IMPLANT
ELECT REM PT RETURN 9FT ADLT (ELECTROSURGICAL) ×2
ELECTRODE REM PT RTRN 9FT ADLT (ELECTROSURGICAL) ×1 IMPLANT
FLOSEAL 10ML (HEMOSTASIS) ×1 IMPLANT
GLOVE BIO SURGEON STRL SZ 6 (GLOVE) ×6 IMPLANT
GLOVE BIO SURGEON STRL SZ 6.5 (GLOVE) ×4 IMPLANT
GOWN STRL REUS W/ TWL LRG LVL4 (GOWN DISPOSABLE) ×3 IMPLANT
GOWN STRL REUS W/TWL LRG LVL3 (GOWN DISPOSABLE) ×6 IMPLANT
GOWN STRL REUS W/TWL LRG LVL4 (GOWN DISPOSABLE) ×6
HOLDER FOLEY CATH W/STRAP (MISCELLANEOUS) ×2 IMPLANT
KIT ACCESSORY DA VINCI DISP (KITS) ×1
KIT ACCESSORY DVNC DISP (KITS) ×1 IMPLANT
KIT BASIN OR (CUSTOM PROCEDURE TRAY) ×2 IMPLANT
MANIPULATOR UTERINE 4.5 ZUMI (MISCELLANEOUS) ×2 IMPLANT
OCCLUDER COLPOPNEUMO (BALLOONS) ×2 IMPLANT
PEN SKIN MARKING BROAD (MISCELLANEOUS) ×2 IMPLANT
POUCH SPECIMEN RETRIEVAL 10MM (ENDOMECHANICALS) IMPLANT
SET TUBE IRRIG SUCTION NO TIP (IRRIGATION / IRRIGATOR) ×2 IMPLANT
SHEET LAVH (DRAPES) ×2 IMPLANT
SOLUTION ANTI FOG 6CC (MISCELLANEOUS) ×2 IMPLANT
SOLUTION ELECTROLUBE (MISCELLANEOUS) ×2 IMPLANT
SUT VIC AB 0 CT1 27 (SUTURE) ×2
SUT VIC AB 0 CT1 27XBRD ANTBC (SUTURE) ×1 IMPLANT
SUT VIC AB 4-0 PS2 27 (SUTURE) ×4 IMPLANT
SUT VICRYL 0 UR6 27IN ABS (SUTURE) ×2 IMPLANT
SYR 50ML LL SCALE MARK (SYRINGE) ×2 IMPLANT
TOWEL OR 17X26 10 PK STRL BLUE (TOWEL DISPOSABLE) ×4 IMPLANT
TOWEL OR NON WOVEN STRL DISP B (DISPOSABLE) ×2 IMPLANT
TRAP SPECIMEN MUCOUS 40CC (MISCELLANEOUS) IMPLANT
TRAY FOLEY CATH 14FRSI W/METER (CATHETERS) ×2 IMPLANT
TRAY LAPAROSCOPIC (CUSTOM PROCEDURE TRAY) ×2 IMPLANT
TROCAR 12M 150ML BLUNT (TROCAR) ×2 IMPLANT
TROCAR BLADELESS OPT 5 100 (ENDOMECHANICALS) ×2 IMPLANT
TROCAR XCEL 12X100 BLDLESS (ENDOMECHANICALS) ×2 IMPLANT
TUBING INSUFFLATION 10FT LAP (TUBING) ×2 IMPLANT
WATER STERILE IRR 1500ML POUR (IV SOLUTION) ×4 IMPLANT

## 2014-01-21 NOTE — Anesthesia Postprocedure Evaluation (Signed)
  Anesthesia Post-op Note  Patient: Melissa Ball  Procedure(s) Performed: Procedure(s) (LRB): ROBOTIC ASSISTED TOTAL HYSTERECTOMY WITH BILATERAL SALPINGO OOPHORECTOMY with omentectomy (N/A)  Patient Location: PACU  Anesthesia Type: General  Level of Consciousness: awake and alert   Airway and Oxygen Therapy: Patient Spontanous Breathing  Post-op Pain: mild  Post-op Assessment: Post-op Vital signs reviewed, Patient's Cardiovascular Status Stable, Respiratory Function Stable, Patent Airway and No signs of Nausea or vomiting  Last Vitals:  Filed Vitals:   01/21/14 1615  BP: 129/75  Pulse: 72  Temp: 36.4 C  Resp: 18    Post-op Vital Signs: stable   Complications: No apparent anesthesia complications

## 2014-01-21 NOTE — H&P (Signed)
Preop Note: Gyn-Onc  Consult was requested by Dr. Pamala Hurry for the evaluation of Gardiner Ramus 63 y.o. female  CC: Dr Marko Plume  Chief Complaint   Patient presents with   .  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   Assessment/Plan:  Ms. CARIZMA DUNSWORTH is a 63 y.o. year old woman with stage IV serous endometrial cancer who is seen for preoperative evaluation and consideration after 4 cycles of dose dense carboplatin and paclitaxel (last dose was 01/03/14).    She is having a very good response to therapy (radiographic, biomarker and symptom reporting) and I am recommending that we intervene with surgery. CBC was at acceptable levels on 01/15/14 (appropriate platelets and Hb). No neutropenia.  CA 125 at 41 (using original assay) which demonstrates continued reduction.  We would attempt surgery with robotic assisted hysterectomy, BSO, possible omentectomy and lymphadenectomy. I discussed that the role of this surgery is largely palliative (to achieve local control in the pelvis, whch was the site of the greatest burden of her disease originally). I discussed that widely metastatic endometrial cancer is not cured with surgery and that she will require at least an additional 3 cycles of consolidative chemotherapy after surgery. I discussed the minimally invasive approach which may be associated with improved healing and decreased perioperative morbidity. We again discussed operative risks including bleeding, infection, damage to internal organs (such as bladder,ureters, bowels), blood clot, reoperation and rehospitalization.    HPI: Ms Killam was originally seen by me on June 22nd, 2015 in consultation at the request of Dr Valentino Saxon for advanced endometrial cancer.  She had first noted an episode of severe sharp lower abdominal pain in December 2014 which was postprandial and resolved after 12 hours with bowel rest. She has had persistent right lower  quadrant, flank pain and abdominal bloating since. Symptoms were worse with eating. She denied vaginal bleeding or discharge.  She underwent a routine gyn care in early June, 2015, with Dr Valentino Saxon after many years of no care. At that time a pap smear revealed AGUS results. This prompted an office examination and biopsy of the endocervix and endometrium on 09/12/13(both of which revealed serous endometrial cancer) and an office [place] which determined the pelvic mass (14.2 x 12 x 13.4cm) in the posterior cul de sac. The uterus was 7x5x2.3cm.  CA 125 drawn on 09/16/13: 964 (elevated)  CT scan on 09/26/13 revealed distant metastatic disease with pleural effusion, omental caking, peritoneal lesions, aortic adenopathy, and a very large 15cm pelvic mass.  Given her advanced disease and poor prognosis, in addition to the anticipated morbidity associated with a radical debulking for her disease distribution, she was recommended to undergo neoadjuvant chemotherapy with paclitaxel and carboplatin.  Dr Marko Plume is Ms Bonds's treating oncologist and she began therapy in July 2015. Her first cycle resulted in substantial toxicity and she was changed to a dose dense protocol which she tolerated much better.  Her last cycle was in October 2015 (01/03/14).  CA 125 has trended:  10/03/13: cycle 1: 1283  10/23/13: cycle 2: 1405  11/15/13: cycle 3: 221  12/04/13: 76/101(with new lab value)  01/03/14:41 CT scan on 12/12/13 revealed:  Improvement to near resolution of retroperitoneal adenopathy. Index left periaortic node measures 1.0 cm today versus 1.5 cm on the prior. No pelvic adenopathy. Improved appearance of the uterus and surrounding periuterine structures. The cystic mass centered in the high left hemipelvis the measures 9.5 x 9.6 cm. Compare  11.9 x 11.6 cm on the prior exam. The portion involving the uterus has resolved, with relative normal appearance of the lower uterine segment and cervix. There is decreased fluid versus  posterior extension of cystic process into the cul-de-sac. Presumed bone island in the right iliac. Improved omental/ peritoneal disease with resolution of abdominal ascites. Left-sided omental implant measures 1.8 x 2.2 cm versus 3.8 x 3.0 cm on the prior.  Interval History: She is feeling dramatically better compared to when I first evaluated her. Her pain has resolved, she feels much less bloating, her appetite is good. She denies vaginal bleeding.  Current Meds:  Outpatient Encounter Prescriptions as of 12/16/2013   Medication  Sig   .  acetaminophen (TYLENOL) 500 MG tablet  Take 500 mg by mouth as needed.   Marland Kitchen  atorvastatin (LIPITOR) 10 MG tablet  Take 1 tablet (10 mg total) by mouth daily.   Marland Kitchen  dexamethasone (DECADRON) 4 MG tablet  Take 5 tablets=20 mg with food 12 hrs prior to Taxol chemotherapy.   .  ferrous fumarate (HEMOCYTE - 106 MG FE) 325 (106 FE) MG TABS tablet  Take 1 tablet daily on empty stomach with orange juice.   Marland Kitchen  HYDROcodone-acetaminophen (NORCO/VICODIN) 5-325 MG per tablet  Take 1 tablet by mouth every 6 (six) hours as needed for moderate pain.   Marland Kitchen  KLOR-CON M10 10 MEQ tablet    .  lisinopril-hydrochlorothiazide (PRINZIDE,ZESTORETIC) 20-25 MG per tablet  TAKE 1 TABLET BY MOUTH DAILY.   Marland Kitchen  LORazepam (ATIVAN) 1 MG tablet  Take 1/2-1 tablet under the tongue or swallow every 6 hrs as needed for nausea. Will make Drowsy.   .  meclizine (ANTIVERT) 25 MG tablet  Take 25 mg by mouth every 6 (six) hours as needed for dizziness.   .  Multiple Vitamin (MULTIVITAMIN) tablet  Take 1 tablet by mouth daily.   .  ondansetron (ZOFRAN) 8 MG tablet  Take 1 tablet (8 mg total) by mouth every 8 (eight) hours as needed for nausea or vomiting.   .  pantoprazole (PROTONIX) 40 MG tablet  Take 1 tablet (40 mg total) by mouth daily.   .  potassium chloride (K-DUR) 10 MEQ tablet  Take 1 tablet (10 mEq total) by mouth daily.   .  sennosides-docusate sodium (SENOKOT-S) 8.6-50 MG tablet  Take 1-4 tablets by  mouth 2 (two) times daily as needed for constipation.   .  sucralfate (CARAFATE) 1 GM/10ML suspension  Take 10 mLs (1 g total) by mouth 4 (four) times daily - with meals and at bedtime.   .  Vitamin D, Cholecalciferol, 400 UNITS CHEW  Chew by mouth.   Allergy: No Known Allergies  Social Hx:  History    Social History   .  Marital Status:  Divorced     Spouse Name:  N/A     Number of Children:  N/A   .  Years of Education:  N/A    Occupational History   .  Housekeeper  [employer]    Social History Main Topics   .  Smoking status:  Current Some Day Smoker   .  Smokeless tobacco:  Not on file   .  Alcohol Use:  0.0 oz/week     0 Glasses of wine per week   .  Drug Use:  No   .  Sexual Activity:  Not on file    Other Topics  Concern   .  Not on file  Social History Narrative    Regular exercise-yes    Caffeine use-no            Past Surgical Hx:  Past Surgical History   Procedure  Laterality  Date   .  Replacement total knee  Left    Past Medical Hx:  Past Medical History   Diagnosis  Date   .  Hypertension    .  Other and unspecified hyperlipidemia  05/28/2012   .  Hyperlipidemia    .  Cancer      endometrial ca   Past Gynecological History: LMP age 51, postmenopausal. Hx of SVD x 1  Family Hx:  Family History   Problem  Relation  Age of Onset   .  Hypertension  Mother    .  Cancer  Mother  78     colon cancer   .  Hypertension  Father    .  Heart disease  Father    .  Cancer  Sister  41     Breast Cancer   .  Alcohol abuse  Brother    .  Cancer  Brother  60     Throat Cancer,lung cancer, smoker   Review of Systems:  Constitutional  Feels well  ENT  Normal appearing ears and nares bilaterally  Skin/Breast  No rash, sores, jaundice, itching, dryness  Cardiovascular  No chest pain, shortness of breath, or edema  Pulmonary  No cough or wheeze.  Gastro Intestinal  No nausea, vomitting, or diarrhoea. No bright red blood per rectum, no abdominal pain, no change  in bowel movement, no constipation, no bloating.  Genito Urinary  No frequency, urgency, dysuria, discharge or vaginal bleeding  Musculo Skeletal  No myalgia, arthralgia, joint swelling or pain  Neurologic  No weakness, numbness, change in gait,  Psychology  No depression, anxiety, insomnia.  Vitals: Blood pressure 109/69, pulse 90, temperature 98.1 F (36.7 C), temperature source Oral, resp. rate 20, height 5\' 4"  (1.626 m), weight 223 lb 6.4 oz (101.334 kg).  Physical Exam:  WD in NAD  Neck  Supple NROM, without any enlargements.  Lymph Node Survey  No cervical supraclavicular or inguinal adenopathy  Cardiovascular  Pulse normal rate, regularity and rhythm. S1 and S2 normal.  Lungs  Clear to auscultation bilateraly, without wheezes/crackles/rhonchi. Good air movement.  Skin  No rash/lesions/breakdown  Psychiatry  Alert and oriented to person, place, and time  Abdomen  Obese, no prior incisional scars. Normoactive bowel sounds, abdomen non-tender, without evidence of hernia. The previously palpable abdominal mass is no longer appreciated.  Back  No CVA tenderness  Genito Urinary  Vulva/vagina: Normal external female genitalia. No lesions. No discharge or bleeding.  Bladder/urethra: No lesions or masses, well supported bladder  Vagina: normal appearing, some mild relaxation, normal rugation  Cervix: Normal appearing, no lesions, appears smooth.  Uterus:globular and increased in size, feels most consistent with small uterus and attached larger ovarian cyst. No discrete parametrial involvement or nodularity.  Adnexa: fullness from the ovarian cyst appreciated posteriorally.  Rectal  Good tone, no masses no cul de sac nodularity. Rectum feels free from the mass  Extremities  No bilateral cyanosis, clubbing or edema.  Donaciano Eva, MD

## 2014-01-21 NOTE — Transfer of Care (Signed)
Immediate Anesthesia Transfer of Care Note  Patient: Melissa Ball  Procedure(s) Performed: Procedure(s): ROBOTIC ASSISTED TOTAL HYSTERECTOMY WITH BILATERAL SALPINGO OOPHORECTOMY with omentectomy (N/A)  Patient Location: PACU  Anesthesia Type:General  Level of Consciousness: sedated  Airway & Oxygen Therapy: Patient Spontanous Breathing and Patient connected to face mask oxygen  Post-op Assessment: Report given to PACU RN and Post -op Vital signs reviewed and unstable, Anesthesiologist notified  Post vital signs: Reviewed and stable  Complications: No apparent anesthesia complications

## 2014-01-21 NOTE — Anesthesia Preprocedure Evaluation (Addendum)
Anesthesia Evaluation  Patient identified by MRN, date of birth, ID band Patient awake    Reviewed: Allergy & Precautions, H&P , NPO status , Patient's Chart, lab work & pertinent test results  Airway Mallampati: II TM Distance: >3 FB Neck ROM: Full    Dental  (+) Edentulous Lower, Edentulous Upper   Pulmonary Current Smoker,  breath sounds clear to auscultation  Pulmonary exam normal       Cardiovascular hypertension, Pt. on medications Rhythm:Regular Rate:Normal     Neuro/Psych negative neurological ROS  negative psych ROS   GI/Hepatic Neg liver ROS, GERD-  Medicated,  Endo/Other  Morbid obesity  Renal/GU negative Renal ROS  negative genitourinary   Musculoskeletal  (+) Arthritis -,   Abdominal (+) + obese,   Peds negative pediatric ROS (+)  Hematology negative hematology ROS (+)   Anesthesia Other Findings   Reproductive/Obstetrics negative OB ROS                          Anesthesia Physical Anesthesia Plan  ASA: III  Anesthesia Plan: General   Post-op Pain Management:    Induction: Intravenous  Airway Management Planned: Oral ETT  Additional Equipment:   Intra-op Plan:   Post-operative Plan: Extubation in OR  Informed Consent: I have reviewed the patients History and Physical, chart, labs and discussed the procedure including the risks, benefits and alternatives for the proposed anesthesia with the patient or authorized representative who has indicated his/her understanding and acceptance.   Dental advisory given  Plan Discussed with: CRNA  Anesthesia Plan Comments:         Anesthesia Quick Evaluation

## 2014-01-21 NOTE — Op Note (Signed)
Surgeon: Donaciano Eva   Assistants: Lahoma Crocker MD (an MD assistant was necessary for tissue manipulation, management of robotic instrumentation, retraction and positioning due to the complexity of the case and hospital policies).    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 (22 modifier for dense pelvic adhesive disease causing dense adhesions)  Surgeon: Donaciano Eva  Assistant Surgeon: Lahoma Crocker MD  Anesthesia: GET  Urine Output: 700cc  Operative Findings:  : No visible residual upper abdominal disease. No ascites. No visible omental nodules. 9cm residual left ovarian cystic mass, densely adherent to sigmoid colon and left pelvic side wall. Right ovary and tube densely adherent to right ovarian fossa and left ovary. 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 Fibrotic changes to tissue plane between bladder and lower uterine segment.  R0 rection, no visible macroscopic residual tumor at completion of case.  Procedure Details  The patient was seen in the Holding Room. The risks, benefits, complications, treatment options, and expected outcomes were discussed with the patient.  The patient concurred with the proposed plan, giving informed consent.  The site of surgery properly noted/marked. The patient was identified as Melissa Ball and the procedure verified as a Robotic-assisted hysterectomy with bilateral salpingo oophorectomy. A Time Out was held and the above information confirmed.  After induction of anesthesia, the patient was draped and prepped in the usual  sterile manner. Pt was placed in supine position after anesthesia and draped and prepped in the usual sterile manner. The abdominal drape was placed after the CholoraPrep had been allowed to dry for 3 minutes.  Her arms were tucked to her side with all appropriate precautions.  The chest was secured to the table.  The patient was placed in the semi-lithotomy position in [place].  The perineum was prepped with Betadine.  Foley catheter was placed.  A sterile speculum was placed in the vagina.  The cervix was grasped with a single-tooth tenaculum and dilated with Kennon Rounds dilators.  The ZUMI uterine manipulator with a medium colpotomizer ring was placed without difficulty.  A pneum occluder balloon was placed over the manipulator.  A second time-out was performed.  OG tube placement was confirmed and to suction.     Procedure:  The patient was brought to the operating room where general anesthesia was administered with no complications.  The patient was placed in the dorsal lithotomy position in padded Allen stirrups.  The arms were tucked at the sides with gel pads protecting the elbows and foam protecting the hands. The patient was then prepped.  A Foley was placed to gravity.  A medium size KOH ring was used to place around the cervix after the cervix had been dilated and then a RUMI manipulator was attached in the normal manner.  The patient was then draped in the normal manner.  Next, a 5 mm skin incision was made 1 cm below the subcostal margin in the midclavicular line.  The 5 mm Optiview port and scope was used for direct entry.  Opening pressure was under 10 mm CO2.  The abdomen was insufflated and the findings were noted as above.   At this point and all points during the procedure, the patient's intra-abdominal pressure did not exceed 15 mmHg. Next,  a 10 mm skin incision was made about 2 cm above the umbilicus and a right and left port was placed about 10 cm lateral to and 15 degree caudad to the  robot port on the right and left side.  A fourth arm was placed in the left lower quadrant 2 cm above and superior and medial to the anterior superior iliac spine.  All ports were placed under direct visualization.  The patient was placed in steep Trendelenburg.  Bowel was away into the upper abdomen.  The robot was docked in the normal manner.  The dense adhesions between the sigmoid colon and the left ovarian cyst were taken taken down with sharp dissection. The left retroperitoneum was opened to identify the left ureter. The left retroperitoneal fibrosis was taken down with sharp dissection and the left ureter was skeletonized free from the left IP ligament. The cecum and appendix were tethered to the right IP ligament and were sharply taken down to skeletonize the right IP ligament at the pelvic brim. The right round ligament was incised and the retroperitoneum was entered and the pararectal space was developed.  The ureter was noted to be on the medial leaf of the broad ligament.  It was encased in dense retroperitoneal fibrosis. This was sharply incised to mobilize and skeletonize the right IP ligament. The peritoneum above the ureter was incised and stretched and the infundibulopelvic ligament was skeletonized, cauterized and cut.  The posterior peritoneum was taken down to the level of the KOH ring.  The anterior peritoneum was also taken down with meticulous sharp and monopolar dissection. The bladder was densely adherent to the uterine fundus and required meticulous dissection to remain in the correct tissue plane.  The bladder flap was created to the level of the KOH ring.  The uterine artery on the right side was skeletonized, cauterized and cut in the normal manner. Attention was again turned to the left. Again a window was created above the skeletonized left broad ligament above the ureter. It facilitated skeletonization of the IP ligament which was fulgarated. The left ureterine artery was  skeletonized.  The colpotomy was made and the uterus, cervix, bilateral ovaries and tubes were amputated and delivered through the vagina.  Pedicles were inspected and excellent hemostasis was achieved.     The omentectomy was performed by delivering the omentum into the mid abdomen and suspending it with the 4th arm such that it was possible to identify the transverse colon. Windows were created with sharp dissection in the infracolic omentum. Bipolar energy sealed the major omental vessels and the omentum was successively separated from right to left. As there was no visible disease in the gastrocolic omentum it was not removed.  The colpotomy at the vaginal cuff was closed with Vicryl on a CT1 needle in a running manner.  Irrigation was used and excellent hemostasis was achieved and the raw surfaces of the sigmoid colon resection were made hemostatic with floseal.  At this point in the procedure was completed.  Robotic instruments were removed under direct visulaization.  The robot was undocked. The 10 mm ports were closed with Vicryl on a UR-5 needle and the fascia was closed with 0 Vicryl on a UR-5 needle.  The skin was closed with 4-0 Vicryl in a subcuticular manner.  Steri-Strips were applied and bandages were also applied.  Sponge, lap and needle counts correct x 2.  The patient was taken to the recovery room in stable condition.  The vagina was swabbed  with  minimal bleeding noted.   All instrument and needle counts were correct x  3.   The patient was transferred to the recovery room in stable condition.  Estimated Blood Loss:  200 mL      Total IV Fluids: 2700 ml         Specimens: PATHOLOGY uterus tubes ovaries, omentum         Complications:  None; patient tolerated the procedure well.         Disposition: PACU - hemodynamically stable.

## 2014-01-22 ENCOUNTER — Ambulatory Visit (HOSPITAL_COMMUNITY): Payer: BC Managed Care – PPO

## 2014-01-22 ENCOUNTER — Encounter (HOSPITAL_COMMUNITY): Payer: Self-pay | Admitting: Gynecologic Oncology

## 2014-01-22 LAB — COMPREHENSIVE METABOLIC PANEL
ALT: 9 U/L (ref 0–35)
AST: 15 U/L (ref 0–37)
Albumin: 3.1 g/dL — ABNORMAL LOW (ref 3.5–5.2)
Alkaline Phosphatase: 54 U/L (ref 39–117)
Anion gap: 14 (ref 5–15)
BILIRUBIN TOTAL: 0.4 mg/dL (ref 0.3–1.2)
BUN: 16 mg/dL (ref 6–23)
CALCIUM: 8.8 mg/dL (ref 8.4–10.5)
CHLORIDE: 101 meq/L (ref 96–112)
CO2: 24 mEq/L (ref 19–32)
Creatinine, Ser: 1.02 mg/dL (ref 0.50–1.10)
GFR calc Af Amer: 66 mL/min — ABNORMAL LOW (ref >90)
GFR calc non Af Amer: 57 mL/min — ABNORMAL LOW (ref >90)
Glucose, Bld: 114 mg/dL — ABNORMAL HIGH (ref 70–99)
POTASSIUM: 3.7 meq/L (ref 3.7–5.3)
Sodium: 139 mEq/L (ref 137–147)
Total Protein: 6.4 g/dL (ref 6.0–8.3)

## 2014-01-22 LAB — CBC
HCT: 24.1 % — ABNORMAL LOW (ref 36.0–46.0)
Hemoglobin: 7.9 g/dL — ABNORMAL LOW (ref 12.0–15.0)
MCH: 27.9 pg (ref 26.0–34.0)
MCHC: 32.8 g/dL (ref 30.0–36.0)
MCV: 85.2 fL (ref 78.0–100.0)
PLATELETS: 166 10*3/uL (ref 150–400)
RBC: 2.83 MIL/uL — AB (ref 3.87–5.11)
RDW: 21.5 % — AB (ref 11.5–15.5)
WBC: 9.5 10*3/uL (ref 4.0–10.5)

## 2014-01-22 LAB — CBC WITH DIFFERENTIAL/PLATELET
Basophils Absolute: 0 10*3/uL (ref 0.0–0.1)
Basophils Relative: 0 % (ref 0–1)
EOS PCT: 0 % (ref 0–5)
Eosinophils Absolute: 0 10*3/uL (ref 0.0–0.7)
HCT: 22.8 % — ABNORMAL LOW (ref 36.0–46.0)
Hemoglobin: 7.5 g/dL — ABNORMAL LOW (ref 12.0–15.0)
Lymphocytes Relative: 28 % (ref 12–46)
Lymphs Abs: 2.5 10*3/uL (ref 0.7–4.0)
MCH: 28.3 pg (ref 26.0–34.0)
MCHC: 32.9 g/dL (ref 30.0–36.0)
MCV: 86 fL (ref 78.0–100.0)
MONOS PCT: 13 % — AB (ref 3–12)
Monocytes Absolute: 1.2 10*3/uL — ABNORMAL HIGH (ref 0.1–1.0)
NEUTROS PCT: 59 % (ref 43–77)
Neutro Abs: 5.3 10*3/uL (ref 1.7–7.7)
PLATELETS: 157 10*3/uL (ref 150–400)
RBC: 2.65 MIL/uL — ABNORMAL LOW (ref 3.87–5.11)
RDW: 21.6 % — ABNORMAL HIGH (ref 11.5–15.5)
WBC: 9 10*3/uL (ref 4.0–10.5)

## 2014-01-22 LAB — BASIC METABOLIC PANEL
Anion gap: 14 (ref 5–15)
BUN: 16 mg/dL (ref 6–23)
CALCIUM: 8.8 mg/dL (ref 8.4–10.5)
CO2: 23 mEq/L (ref 19–32)
Chloride: 106 mEq/L (ref 96–112)
Creatinine, Ser: 0.91 mg/dL (ref 0.50–1.10)
GFR, EST AFRICAN AMERICAN: 76 mL/min — AB (ref >90)
GFR, EST NON AFRICAN AMERICAN: 65 mL/min — AB (ref >90)
Glucose, Bld: 120 mg/dL — ABNORMAL HIGH (ref 70–99)
Potassium: 4.4 mEq/L (ref 3.7–5.3)
Sodium: 143 mEq/L (ref 137–147)

## 2014-01-22 LAB — TROPONIN I

## 2014-01-22 LAB — TSH: TSH: 1.17 u[IU]/mL (ref 0.350–4.500)

## 2014-01-22 LAB — PREPARE RBC (CROSSMATCH)

## 2014-01-22 MED ORDER — ENOXAPARIN (LOVENOX) PATIENT EDUCATION KIT
PACK | Freq: Once | Status: AC
  Administered 2014-01-22: 11:00:00
  Filled 2014-01-22: qty 1

## 2014-01-22 MED ORDER — SODIUM CHLORIDE 0.9 % IV SOLN
Freq: Once | INTRAVENOUS | Status: AC
  Administered 2014-01-22: 19:00:00 via INTRAVENOUS

## 2014-01-22 MED ORDER — SODIUM CHLORIDE 0.9 % IJ SOLN: 10.0000 mL | Freq: Two times a day (BID) | INTRAMUSCULAR | Status: DC

## 2014-01-22 MED ORDER — KCL IN DEXTROSE-NACL 20-5-0.45 MEQ/L-%-% IV SOLN
INTRAVENOUS | Status: DC
  Administered 2014-01-22: 16:00:00 via INTRAVENOUS
  Filled 2014-01-22 (×4): qty 1000

## 2014-01-22 MED ORDER — SODIUM CHLORIDE 0.9 % IJ SOLN
10.0000 mL | INTRAMUSCULAR | Status: DC | PRN
  Administered 2014-01-22 – 2014-01-23 (×2): 10 mL

## 2014-01-22 MED ORDER — IOHEXOL 350 MG/ML SOLN
100.0000 mL | Freq: Once | INTRAVENOUS | Status: AC | PRN
  Administered 2014-01-22: 100 mL via INTRAVENOUS

## 2014-01-22 MED ORDER — ENOXAPARIN SODIUM 40 MG/0.4ML ~~LOC~~ SOLN: 40.0000 mg | SUBCUTANEOUS | Status: DC

## 2014-01-22 MED ORDER — OXYCODONE-ACETAMINOPHEN 5-325 MG PO TABS: 1.0000 | ORAL_TABLET | ORAL | Status: DC | PRN

## 2014-01-22 MED ORDER — SODIUM CHLORIDE 0.9 % IV BOLUS (SEPSIS)
500.0000 mL | Freq: Once | INTRAVENOUS | Status: AC
  Administered 2014-01-22: 500 mL via INTRAVENOUS

## 2014-01-22 NOTE — Progress Notes (Addendum)
Patient vital signs obtained.  Melissa John NP paged to let her know patient heart rate 118.  Await return page.  Lovenox teaching given to patient and son.  Questions answered.  1300 orders received to fluid bolus patient per port and complete an EKG.  IV team notified for access to [facility]

## 2014-01-22 NOTE — Discharge Instructions (Signed)
01/22/2014  Return to work: 4-6 weeks if applicable  Activity: 1. Be up and out of the bed during the day.  Take a nap if needed.  You may walk up steps but be careful and use the hand rail.  Stair climbing will tire you more than you think, you may need to stop part way and rest.   2. No lifting or straining for 6 weeks.  3. No driving for 1 week.  Do not drive if you are taking narcotic pain medicine.  4. Shower daily.  Use soap and water on your incision and pat dry; don't rub.  No tub baths until cleared by your surgeon.   5. No sexual activity and nothing in the vagina for 8 weeks.  Diet: 1. Low sodium Heart Healthy Diet is recommended.  2. It is safe to use a laxative, such as Miralax or Colace, if you have difficulty moving your bowels.   Wound Care: 1. Keep clean and dry.  Shower daily.  Reasons to call the Doctor:  Fever - Oral temperature greater than 100.4 degrees Fahrenheit  Foul-smelling vaginal discharge  Difficulty urinating  Nausea and vomiting  Increased pain at the site of the incision that is unrelieved with pain medicine.  Difficulty breathing with or without chest pain  New calf pain especially if only on one side  Sudden, continuing increased vaginal bleeding with or without clots.   Contacts: For questions or concerns you should contact:  Dr. Everitt Amber at [phone]  Dr. Lahoma Crocker at [phone]  Joylene John, NP at [phone]  After Hours: call [phone] and ask for the GYN Oncologist on call  Acetaminophen; Oxycodone tablets What is this medicine? ACETAMINOPHEN; OXYCODONE (a set a MEE noe fen; ox i KOE done) is a pain reliever. It is used to treat mild to moderate pain. This medicine may be used for other purposes; ask your health care provider or pharmacist if you have questions. COMMON BRAND NAME(S): Endocet, Magnacet, Narvox, Percocet, Perloxx, Primalev, Primlev, Roxicet, Xolox What should I tell my health care  provider before I take this medicine? They need to know if you have any of these conditions: -brain tumor -Crohn's disease, inflammatory bowel disease, or ulcerative colitis -drug abuse or addiction -head injury -heart or circulation problems -if you often drink alcohol -kidney disease or problems going to the bathroom -liver disease -lung disease, asthma, or breathing problems -an unusual or allergic reaction to acetaminophen, oxycodone, other opioid analgesics, other medicines, foods, dyes, or preservatives -pregnant or trying to get pregnant -breast-feeding How should I use this medicine? Take this medicine by mouth with a full glass of water. Follow the directions on the prescription label. Take your medicine at regular intervals. Do not take your medicine more often than directed. Talk to your pediatrician regarding the use of this medicine in children. Special care may be needed. Patients over 71 years old may have a stronger reaction and need a smaller dose. Overdosage: If you think you have taken too much of this medicine contact a poison control center or emergency room at once. NOTE: This medicine is only for you. Do not share this medicine with others. What if I miss a dose? If you miss a dose, take it as soon as you can. If it is almost time for your next dose, take only that dose. Do not take double or extra doses. What may interact with this medicine? -alcohol -antihistamines -barbiturates like amobarbital, butalbital, butabarbital, methohexital, pentobarbital, phenobarbital, thiopental, and  secobarbital -benztropine -drugs for bladder problems like solifenacin, trospium, oxybutynin, tolterodine, hyoscyamine, and methscopolamine -drugs for breathing problems like ipratropium and tiotropium -drugs for certain stomach or intestine problems like propantheline, homatropine methylbromide, glycopyrrolate, atropine, belladonna, and dicyclomine -general anesthetics like etomidate,  ketamine, nitrous oxide, propofol, desflurane, enflurane, halothane, isoflurane, and sevoflurane -medicines for depression, anxiety, or psychotic disturbances -medicines for sleep -muscle relaxants -naltrexone -narcotic medicines (opiates) for pain -phenothiazines like perphenazine, thioridazine, chlorpromazine, mesoridazine, fluphenazine, prochlorperazine, promazine, and trifluoperazine -scopolamine -tramadol -trihexyphenidyl This list may not describe all possible interactions. Give your health care provider a list of all the medicines, herbs, non-prescription drugs, or dietary supplements you use. Also tell them if you smoke, drink alcohol, or use illegal drugs. Some items may interact with your medicine. What should I watch for while using this medicine? Tell your doctor or health care professional if your pain does not go away, if it gets worse, or if you have new or a different type of pain. You may develop tolerance to the medicine. Tolerance means that you will need a higher dose of the medication for pain relief. Tolerance is normal and is expected if you take this medicine for a long time. Do not suddenly stop taking your medicine because you may develop a severe reaction. Your body becomes used to the medicine. This does NOT mean you are addicted. Addiction is a behavior related to getting and using a drug for a non-medical reason. If you have pain, you have a medical reason to take pain medicine. Your doctor will tell you how much medicine to take. If your doctor wants you to stop the medicine, the dose will be slowly lowered over time to avoid any side effects. You may get drowsy or dizzy. Do not drive, use machinery, or do anything that needs mental alertness until you know how this medicine affects you. Do not stand or sit up quickly, especially if you are an older patient. This reduces the risk of dizzy or fainting spells. Alcohol may interfere with the effect of this medicine. Avoid  alcoholic drinks. There are different types of narcotic medicines (opiates) for pain. If you take more than one type at the same time, you may have more side effects. Give your health care provider a list of all medicines you use. Your doctor will tell you how much medicine to take. Do not take more medicine than directed. Call emergency for help if you have problems breathing. The medicine will cause constipation. Try to have a bowel movement at least every 2 to 3 days. If you do not have a bowel movement for 3 days, call your doctor or health care professional. Do not take Tylenol (acetaminophen) or medicines that have acetaminophen with this medicine. Too much acetaminophen can be very dangerous. Many nonprescription medicines contain acetaminophen. Always read the labels carefully to avoid taking more acetaminophen. What side effects may I notice from receiving this medicine? Side effects that you should report to your doctor or health care professional as soon as possible: -allergic reactions like skin rash, itching or hives, swelling of the face, lips, or tongue -breathing difficulties, wheezing -confusion -light headedness or fainting spells -severe stomach pain -unusually weak or tired -yellowing of the skin or the whites of the eyes Side effects that usually do not require medical attention (report to your doctor or health care professional if they continue or are bothersome): -dizziness -drowsiness -nausea -vomiting This list may not describe all possible side effects. Call your doctor for  medical advice about side effects. You may report side effects to FDA at 1-800-FDA-1088. Where should I keep my medicine? Keep out of the reach of children. This medicine can be abused. Keep your medicine in a safe place to protect it from theft. Do not share this medicine with anyone. Selling or giving away this medicine is dangerous and against the law. Store at room temperature between 20 and 25  degrees C (68 and 77 degrees F). Keep container tightly closed. Protect from light. This medicine may cause accidental overdose and death if it is taken by other adults, children, or pets. Flush any unused medicine down the toilet to reduce the chance of harm. Do not use the medicine after the expiration date. NOTE: This sheet is a summary. It may not cover all possible information. If you have questions about this medicine, talk to your doctor, pharmacist, or health care provider.  2015, [employer]. (2012-11-12 13:17:35)

## 2014-01-22 NOTE — Plan of Care (Signed)
Problem: Phase II Progression Outcomes Goal: Afebrile, VS remain stable Outcome: Progressing See progress notes

## 2014-01-22 NOTE — Progress Notes (Signed)
Discussed with Joylene John NP patient hemoglobin and hct and heart rate of 119.  Pt just received am medications will reassess before discharge

## 2014-01-22 NOTE — Progress Notes (Addendum)
Discharge on hold at this time.  EKG sinus tach.  HR 123 per nursing staff.  Per Dr. Delsa Sale: CT angiogram to rule out PE, CBC, Cmet, TSH, troponin.  IVF at 125 cc/hr, transfusion of 2 units PRBCs.  Possibility for having blood discussed with the patient this am.  Verbalizing understanding at that time including risks/benefits with no reactions reported when she received blood in the past.  16:09 pm:  Patient assessed at the bedside.  Alert, oriented, in no distress.  Tachycardic at HR 124 with regular rhythm.  No clicks, rubs, or murmurs.  Lungs clear with no crackles or rales.  Port accessed.  Denies lightheadedness, dizziness, dyspnea, chest pain, nausea, emesis.  Reporting mild abdominal discomfort with deep inspiration.  Tolerating diet and voiding without difficulty. Above plan discussed with patient and son.  Agreeable with the plan and no concerns voiced.  Patient agreeable with receiving a blood transfusion, including risks and benefits.  Blood consent up to date from recent surgery.  When exiting the room, patient starting to go for a walk and began experiencing mild lightheadedness but denied feeling weak or unstable.  Son walking with patient.  Advised to inform nursing staff of any changes or concerns.

## 2014-01-22 NOTE — Discharge Summary (Signed)
Physician Discharge Summary  Patient ID: Melissa Ball MRN: [id number] DOB/AGE: [birth year] 63 y.o.  Admit date: 01/21/2014 Discharge date: 01/22/2014  Admission Diagnoses: Endometrial cancer determined by uterine biopsy  Discharge Diagnoses:  Principal Problem:   Endometrial cancer determined by uterine biopsy Active Problems:   Ovarian cancer  Discharged Condition:  The patient is in good condition and stable for discharge.    Hospital Course: On 01/21/2014, the patient underwent the following: Procedure(s): ROBOTIC ASSISTED TOTAL HYSTERECTOMY WITH BILATERAL SALPINGO OOPHORECTOMY with omentectomy.  The postoperative course was uneventful.  She was discharged to home on postoperative day 1 tolerating a regular diet, passing flatus, with minimal pain reported.  She was discharged home with lovenox prophylactic dose daily for a total of 28 days per Dr. Denman George for DVT prevention post-op.  Consults: None  Significant Diagnostic Studies: None  Treatments: surgery: see above  Discharge Exam: Blood pressure 129/73, pulse 119, temperature 98.8 F (37.1 C), temperature source Oral, resp. rate 18, height 5\' 4"  (1.626 m), weight 230 lb (104.327 kg), SpO2 100.00%. General appearance: alert, cooperative and no distress Resp: clear to auscultation bilaterally Cardio: regular rate and rhythm, S1, S2 normal, no murmur, click, rub or gallop GI: soft, non-tender; bowel sounds normal; no masses,  no organomegaly Extremities: extremities normal, atraumatic, no cyanosis or edema Incision/Wound: Lap sites to abdomen with dermabond without erythema or drainage  Disposition: 01-Home or Self Care      Discharge Instructions   Call MD for:  difficulty breathing, headache or visual disturbances    Complete by:  As directed      Call MD for:  extreme fatigue    Complete by:  As directed      Call MD for:  hives    Complete by:  As directed      Call MD for:  persistant dizziness or  light-headedness    Complete by:  As directed      Call MD for:  persistant nausea and vomiting    Complete by:  As directed      Call MD for:  redness, tenderness, or signs of infection (pain, swelling, redness, odor or green/yellow discharge around incision site)    Complete by:  As directed      Call MD for:  severe uncontrolled pain    Complete by:  As directed      Call MD for:  temperature >100.4    Complete by:  As directed      Diet - low sodium heart healthy    Complete by:  As directed      Driving Restrictions    Complete by:  As directed   No driving for 1 week.  Do not take narcotics and drive.     Increase activity slowly    Complete by:  As directed      Lifting restrictions    Complete by:  As directed   No lifting greater than 10 lbs.     Sexual Activity Restrictions    Complete by:  As directed   No sexual activity, nothing in the vagina, for 8 weeks.            Medication List    STOP taking these medications       HYDROcodone-acetaminophen 5-325 MG per tablet  Commonly known as:  NORCO/VICODIN      TAKE these medications       acetaminophen 500 MG tablet  Commonly known as:  TYLENOL  Take 500-1,000 mg  by mouth as needed for mild pain.     atorvastatin 10 MG tablet  Commonly known as:  LIPITOR  Take 10 mg by mouth daily.     dexamethasone 4 MG tablet  Commonly known as:  DECADRON  Take 20 mg by mouth See admin instructions. Take 12 hours prior to chemotherapy.     docusate sodium 100 MG capsule  Commonly known as:  COLACE  Take 100 mg by mouth daily.     enoxaparin 40 MG/0.4ML injection  Commonly known as:  LOVENOX  Inject 0.4 mLs (40 mg total) into the skin daily.     IRON PO  Take 1 tablet by mouth every morning.     lidocaine-prilocaine cream  Commonly known as:  EMLA  Apply 1 application topically See admin instructions. Apply to Porta-Cath 1-2 hours prior to access as directed.     lisinopril-hydrochlorothiazide 20-25 MG per  tablet  Commonly known as:  PRINZIDE,ZESTORETIC  Take 1 tablet by mouth every morning.     LORazepam 1 MG tablet  Commonly known as:  ATIVAN  Take 0.5-1 mg by mouth every 6 (six) hours as needed for anxiety.     meclizine 25 MG tablet  Commonly known as:  ANTIVERT  Take 25 mg by mouth every 6 (six) hours as needed for dizziness.     multivitamin tablet  Take 1 tablet by mouth every morning.     ondansetron 8 MG tablet  Commonly known as:  ZOFRAN  Take 8 mg by mouth every 8 (eight) hours as needed for nausea or vomiting.     oxyCODONE-acetaminophen 5-325 MG per tablet  Commonly known as:  PERCOCET/ROXICET  Take 1-2 tablets by mouth every 4 (four) hours as needed for moderate pain or severe pain (moderate to severe pain).     pantoprazole 40 MG tablet  Commonly known as:  PROTONIX  Take 40 mg by mouth daily as needed (acid reflux).     potassium chloride 10 MEQ tablet  Commonly known as:  K-DUR,KLOR-CON  Take 10 mEq by mouth every morning.     sennosides-docusate sodium 8.6-50 MG tablet  Commonly known as:  SENOKOT-S  Take 1 tablet by mouth every morning.     sucralfate 1 GM/10ML suspension  Commonly known as:  CARAFATE  Take 2 g by mouth 4 (four) times daily.       Follow-up Information   Follow up with Donaciano Eva, MD On 01/30/2014. (at 10:45am at the [facility])    Specialty:  Obstetrics and Gynecology   Contact information:   [place] [address] [phone]       Greater than thirty minutes were spend for face to face discharge instructions and discharge orders/summary in EPIC.   Signed: Fredi Hurtado DEAL 01/22/2014, 10:36 AM

## 2014-01-23 DIAGNOSIS — N138 Other obstructive and reflux uropathy: Secondary | ICD-10-CM | POA: Diagnosis present

## 2014-01-23 DIAGNOSIS — C7982 Secondary malignant neoplasm of genital organs: Secondary | ICD-10-CM | POA: Diagnosis present

## 2014-01-23 DIAGNOSIS — E785 Hyperlipidemia, unspecified: Secondary | ICD-10-CM | POA: Diagnosis present

## 2014-01-23 DIAGNOSIS — Z9221 Personal history of antineoplastic chemotherapy: Secondary | ICD-10-CM | POA: Diagnosis not present

## 2014-01-23 DIAGNOSIS — D62 Acute posthemorrhagic anemia: Secondary | ICD-10-CM | POA: Diagnosis not present

## 2014-01-23 DIAGNOSIS — N736 Female pelvic peritoneal adhesions (postinfective): Secondary | ICD-10-CM | POA: Diagnosis present

## 2014-01-23 DIAGNOSIS — I10 Essential (primary) hypertension: Secondary | ICD-10-CM | POA: Diagnosis present

## 2014-01-23 DIAGNOSIS — C562 Malignant neoplasm of left ovary: Secondary | ICD-10-CM | POA: Diagnosis present

## 2014-01-23 DIAGNOSIS — Z801 Family history of malignant neoplasm of trachea, bronchus and lung: Secondary | ICD-10-CM | POA: Diagnosis not present

## 2014-01-23 DIAGNOSIS — Z90722 Acquired absence of ovaries, bilateral: Secondary | ICD-10-CM

## 2014-01-23 DIAGNOSIS — C787 Secondary malignant neoplasm of liver and intrahepatic bile duct: Secondary | ICD-10-CM | POA: Diagnosis present

## 2014-01-23 DIAGNOSIS — C786 Secondary malignant neoplasm of retroperitoneum and peritoneum: Secondary | ICD-10-CM | POA: Diagnosis present

## 2014-01-23 DIAGNOSIS — Z9079 Acquired absence of other genital organ(s): Secondary | ICD-10-CM

## 2014-01-23 DIAGNOSIS — K219 Gastro-esophageal reflux disease without esophagitis: Secondary | ICD-10-CM | POA: Diagnosis present

## 2014-01-23 DIAGNOSIS — Z90710 Acquired absence of both cervix and uterus: Secondary | ICD-10-CM

## 2014-01-23 DIAGNOSIS — C541 Malignant neoplasm of endometrium: Secondary | ICD-10-CM | POA: Diagnosis present

## 2014-01-23 DIAGNOSIS — Z803 Family history of malignant neoplasm of breast: Secondary | ICD-10-CM | POA: Diagnosis not present

## 2014-01-23 DIAGNOSIS — F17210 Nicotine dependence, cigarettes, uncomplicated: Secondary | ICD-10-CM | POA: Diagnosis present

## 2014-01-23 DIAGNOSIS — Z6839 Body mass index (BMI) 39.0-39.9, adult: Secondary | ICD-10-CM | POA: Diagnosis not present

## 2014-01-23 DIAGNOSIS — R000 Tachycardia, unspecified: Secondary | ICD-10-CM | POA: Diagnosis present

## 2014-01-23 DIAGNOSIS — Z800 Family history of malignant neoplasm of digestive organs: Secondary | ICD-10-CM | POA: Diagnosis not present

## 2014-01-23 DIAGNOSIS — Z808 Family history of malignant neoplasm of other organs or systems: Secondary | ICD-10-CM | POA: Diagnosis not present

## 2014-01-23 LAB — CBC
HCT: 28.9 % — ABNORMAL LOW (ref 36.0–46.0)
HEMOGLOBIN: 9.7 g/dL — AB (ref 12.0–15.0)
MCH: 28.8 pg (ref 26.0–34.0)
MCHC: 33.6 g/dL (ref 30.0–36.0)
MCV: 85.8 fL (ref 78.0–100.0)
Platelets: 144 10*3/uL — ABNORMAL LOW (ref 150–400)
RBC: 3.37 MIL/uL — AB (ref 3.87–5.11)
RDW: 19.7 % — ABNORMAL HIGH (ref 11.5–15.5)
WBC: 9 10*3/uL (ref 4.0–10.5)

## 2014-01-23 MED ORDER — HEPARIN SOD (PORK) LOCK FLUSH 100 UNIT/ML IV SOLN
500.0000 [IU] | INTRAVENOUS | Status: AC | PRN
  Administered 2014-01-23: 500 [IU]

## 2014-01-23 MED ORDER — ACETAMINOPHEN 325 MG PO TABS
650.0000 mg | ORAL_TABLET | ORAL | Status: DC | PRN
  Administered 2014-01-23: 650 mg via ORAL
  Filled 2014-01-23: qty 2

## 2014-01-23 NOTE — Discharge Summary (Signed)
Physician Discharge Summary  Patient ID: Melissa Ball MRN: [id number] DOB/AGE: [birth year] 65 y.o.  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 She had 200cc EBL. Postoperatively she did well however had tachycardia and anemia (7.5). Was transfused 2 units. Now doing better with appropriate response in Hb. Good UO. No signs of ongoing bleeding. Mild persistent tachycardia. Chest CT scan negative for PE, and EKG revealed sinus tachy with no signs of MI.  Consults: None  Significant Diagnostic Studies: CT of chest, EKG, labs  Treatments: surgery: see above; blood transfusion postop - 2 units PRBC  Discharge Exam: Blood pressure 111/82, pulse 106, temperature 97.8 F (36.6 C), temperature source Oral, resp. rate 16, height 5\' 4"  (1.626 m), weight 230 lb (104.327 kg), SpO2 100.00%. General appearance: alert and cooperative Resp: clear to auscultation bilaterally Cardio: regular rate and rhythm, S1, S2 normal, no murmur, click, rub or gallop and tachycardic Pelvic: vagina normal without discharge Extremities: extremities normal, atraumatic, no cyanosis or edema Pulses: 2+ and symmetric  Disposition: 01-Home or Self Care  Discharge Instructions   Call MD for:  difficulty breathing, headache or visual disturbances    Complete by:  As directed      Call MD for:  extreme fatigue    Complete by:  As directed      Call MD for:  hives    Complete by:  As directed      Call MD for:  persistant dizziness or light-headedness    Complete by:  As directed      Call MD for:  persistant nausea and vomiting    Complete by:  As directed       Call MD for:  redness, tenderness, or signs of infection (pain, swelling, redness, odor or green/yellow discharge around incision site)    Complete by:  As directed      Call MD for:  severe uncontrolled pain    Complete by:  As directed      Call MD for:  temperature >100.4    Complete by:  As directed      Diet - low sodium heart healthy    Complete by:  As directed      Driving Restrictions    Complete by:  As directed   No driving for 1 week.  Do not take narcotics and drive.     Increase activity slowly    Complete by:  As directed      Lifting restrictions    Complete by:  As directed   No lifting greater than 10 lbs.     Sexual Activity Restrictions    Complete by:  As directed   No sexual activity, nothing in the vagina, for 8 weeks.            Medication List    STOP taking these medications       HYDROcodone-acetaminophen 5-325 MG per tablet  Commonly known as:  NORCO/VICODIN      TAKE these medications       acetaminophen 500 MG tablet  Commonly known as:  TYLENOL  Take 500-1,000 mg by mouth as needed for mild pain.     atorvastatin 10 MG tablet  Commonly known as:  LIPITOR  Take 10 mg by mouth daily.     dexamethasone 4 MG tablet  Commonly known as:  DECADRON  Take 20 mg by mouth See admin instructions. Take 12 hours prior to chemotherapy.     docusate sodium 100 MG capsule  Commonly known as:  COLACE  Take 100 mg by mouth daily.     enoxaparin 40 MG/0.4ML injection  Commonly known as:  LOVENOX  Inject 0.4 mLs (40 mg total) into the skin daily.     IRON PO  Take 1 tablet by mouth every morning.     lidocaine-prilocaine cream  Commonly known as:  EMLA  Apply 1 application topically See admin instructions. Apply to Porta-Cath 1-2 hours prior to access as directed.     lisinopril-hydrochlorothiazide 20-25 MG per tablet  Commonly known as:  PRINZIDE,ZESTORETIC  Take 1 tablet by mouth every morning.     LORazepam 1 MG tablet  Commonly  known as:  ATIVAN  Take 0.5-1 mg by mouth every 6 (six) hours as needed for anxiety.     meclizine 25 MG tablet  Commonly known as:  ANTIVERT  Take 25 mg by mouth every 6 (six) hours as needed for dizziness.     multivitamin tablet  Take 1 tablet by mouth every morning.     ondansetron 8 MG tablet  Commonly known as:  ZOFRAN  Take 8 mg by mouth every 8 (eight) hours as needed for nausea or vomiting.     oxyCODONE-acetaminophen 5-325 MG per tablet  Commonly known as:  PERCOCET/ROXICET  Take 1-2 tablets by mouth every 4 (four) hours as needed for moderate pain or severe pain (moderate to severe pain).     pantoprazole 40 MG tablet  Commonly known as:  PROTONIX  Take 40 mg by mouth daily as needed (acid reflux).     potassium chloride 10 MEQ tablet  Commonly known as:  K-DUR,KLOR-CON  Take 10 mEq by mouth every morning.     sennosides-docusate sodium 8.6-50 MG tablet  Commonly known as:  SENOKOT-S  Take 1 tablet by mouth every morning.     sucralfate 1 GM/10ML suspension  Commonly known as:  CARAFATE  Take 2 g by mouth 4 (four) times daily.           Follow-up Information   Follow up with Donaciano Eva, MD On 01/30/2014. (at 10:45am at the [facility])    Specialty:  Obstetrics and Gynecology   Contact information:   [place] [address] [phone]       Signed: Donaciano Eva 01/23/2014, 10:00 AM

## 2014-01-23 NOTE — Progress Notes (Signed)
[facility] notified of pt's tachycardia- orders received.

## 2014-01-23 NOTE — Progress Notes (Signed)
2 Days Post-Op Procedure(s) (LRB): ROBOTIC ASSISTED TOTAL HYSTERECTOMY WITH BILATERAL SALPINGO OOPHORECTOMY with omentectomy (N/A)  Subjective: Patient reports passing flatus, no pain, feeling well,  no problems voiding.    Objective: Vital signs in last 24 hours: Temp:  [97.8 F (36.6 C)-99.4 F (37.4 C)] 97.8 F (36.6 C) (10/22 0943) Pulse Rate:  [93-123] 106 (10/22 0943) Resp:  [16-18] 16 (10/22 0943) BP: (94-120)/(52-89) 111/82 mmHg (10/22 0943) SpO2:  [97 %-100 %] 100 % (10/22 0943) Last BM Date: 01/20/14  Intake/Output from previous day: 10/21 0701 - 10/22 0700 In: 2870.2 [P.O.:1700; I.V.:197.9; Blood:972.3] Out: 2350 [Urine:2350]  Physical Examination: General: alert and cooperative Resp: clear to auscultation bilaterally Cardio: regular rate and rhythm, S1, S2 normal, no murmur, click, rub or gallop and tachycardic GI: soft, non-tender; bowel sounds normal; no masses,  no organomegaly and incision: clean, dry and intact Extremities: extremities normal, atraumatic, no cyanosis or edema Vaginal Bleeding: none  Labs: WBC/Hgb/Hct/Plts:  9.0/9.7/28.9/144 (10/22 0840) BUN/Cr/glu/ALT/AST/amyl/lip:  16/1.02/--/9/15/--/-- (10/21 1647)   Assessment:  63 y.o. s/p Procedure(s): ROBOTIC ASSISTED TOTAL HYSTERECTOMY WITH BILATERAL SALPINGO OOPHORECTOMY with omentectomy: stable Pain:  Pain is well-controlled on oral medications (taking only tylenol)  Heme:Anemia: s/p blood transfusion of 2 units of PRBC for acute blood loss anemia associated with tachycardia. However, patient's Hb bumped/responded appropriately and her HR improved also. She has no signs of ongoing blood loss.  CV: sinus tachycardia - sinus tach on EKG (no arrythmia). CT chest negative for PE. Patient not in pain. May be from mild anemia. Patient appears clincally well. No chest pain or SOB.    GI:  Tolerating po: Yes.   FEN: will d.c IVF's  Prophylaxis: pharmacologic prophylaxis (with any of the following:  enoxaparin (Lovenox) 40mg  SQ 2 hours prior to surgery then every day). For 4 weeks postop  Plan: Discharge home will return to office in 1 week for followup to clear patient for additional chemotherapy cycles. Dispo:  Discharge plan to include :consults: @CM @, Social Work The patient is to be discharged to home.   LOS: 2 days    Donaciano Eva 01/23/2014, 9:54 AM

## 2014-01-23 NOTE — Progress Notes (Signed)
Pt administered own Lovenox in abd tissue this am- teaching done and pt demonstrated understanding. Discharge instructions given along with prescription. VS done- pt's heart apical pulse slightly tachy- Dr. Denman George aware

## 2014-01-24 ENCOUNTER — Other Ambulatory Visit: Payer: BC Managed Care – PPO

## 2014-01-24 ENCOUNTER — Ambulatory Visit: Payer: BC Managed Care – PPO

## 2014-01-24 LAB — TYPE AND SCREEN
ABO/RH(D): O POS
Antibody Screen: NEGATIVE
UNIT DIVISION: 0
Unit division: 0

## 2014-01-30 ENCOUNTER — Ambulatory Visit: Payer: BC Managed Care – PPO | Attending: Gynecologic Oncology | Admitting: Gynecologic Oncology

## 2014-01-30 ENCOUNTER — Encounter: Payer: Self-pay | Admitting: Gynecologic Oncology

## 2014-01-30 DIAGNOSIS — C569 Malignant neoplasm of unspecified ovary: Secondary | ICD-10-CM | POA: Insufficient documentation

## 2014-01-30 DIAGNOSIS — I10 Essential (primary) hypertension: Secondary | ICD-10-CM | POA: Insufficient documentation

## 2014-01-30 DIAGNOSIS — K219 Gastro-esophageal reflux disease without esophagitis: Secondary | ICD-10-CM | POA: Insufficient documentation

## 2014-01-30 DIAGNOSIS — Z90710 Acquired absence of both cervix and uterus: Secondary | ICD-10-CM | POA: Insufficient documentation

## 2014-01-30 DIAGNOSIS — Z719 Counseling, unspecified: Secondary | ICD-10-CM | POA: Insufficient documentation

## 2014-01-30 DIAGNOSIS — Z9079 Acquired absence of other genital organ(s): Secondary | ICD-10-CM | POA: Insufficient documentation

## 2014-01-30 DIAGNOSIS — E785 Hyperlipidemia, unspecified: Secondary | ICD-10-CM | POA: Diagnosis not present

## 2014-01-30 DIAGNOSIS — Z90722 Acquired absence of ovaries, bilateral: Secondary | ICD-10-CM | POA: Diagnosis not present

## 2014-01-30 DIAGNOSIS — F17210 Nicotine dependence, cigarettes, uncomplicated: Secondary | ICD-10-CM | POA: Insufficient documentation

## 2014-01-30 DIAGNOSIS — Z483 Aftercare following surgery for neoplasm: Secondary | ICD-10-CM

## 2014-01-30 NOTE — Patient Instructions (Signed)
Plan for three more cycles (9 treatments) with a scan after then plan to see Dr. Denman George.  Please call for any questions or concerns.

## 2014-01-30 NOTE — Progress Notes (Signed)
Postop Followup HPI:  Melissa Ball is a 63 y.o. year old G1 initially seen in consultation on [place] for clinical stage IVB endometrial cancer.  She then underwent neoadjuvant chemotherapy with platinum and taxane therapy x 4 cycles with Dr Marko Plume between July 2nd and October 14th. She had a significant clinical response to therapy and on 01/21/14 she underwent an interval robotic debulking including hysterectomy, BSO, omentectomy to microscopic disease without complications.  Her postoperative course was complicated by some anemia postoperatively for which she received a blood transfusion with an appropriate response.  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   She is seen today for a postoperative check and to discuss her pathology results and ongoing plan.  Since discharge from the hospital, she is feeling very well.  She has improving appetite, normal bowel and bladder function, and pain controlled with minimal PO medication. She had some mild separation of her periumbilical incision. No drainage. She has no other complaints today.  Current Outpatient Prescriptions on File Prior to Visit  Medication Sig Dispense Refill  . atorvastatin (LIPITOR) 10 MG tablet Take 10 mg by mouth daily.      Marland Kitchen dexamethasone (DECADRON) 4 MG tablet Take 20 mg by mouth See admin instructions. Take 12 hours prior to chemotherapy.      . docusate sodium (COLACE) 100 MG capsule Take 100 mg by mouth daily.      Marland Kitchen enoxaparin (LOVENOX) 40 MG/0.4ML injection Inject 0.4 mLs (40 mg total) into the skin daily.  27 Syringe  0  . IRON PO Take 1 tablet by mouth every morning.      . lidocaine-prilocaine (EMLA) cream Apply 1 application topically See admin instructions. Apply to Porta-Cath 1-2 hours prior to access as directed.      Marland Kitchen lisinopril-hydrochlorothiazide (PRINZIDE,ZESTORETIC) 20-25 MG per tablet Take 1 tablet by  mouth every morning.      Marland Kitchen LORazepam (ATIVAN) 1 MG tablet Take 0.5-1 mg by mouth every 6 (six) hours as needed for anxiety.      . Multiple Vitamin (MULTIVITAMIN) tablet Take 1 tablet by mouth every morning.       . ondansetron (ZOFRAN) 8 MG tablet Take 8 mg by mouth every 8 (eight) hours as needed for nausea or vomiting.      . potassium chloride (K-DUR,KLOR-CON) 10 MEQ tablet Take 10 mEq by mouth every morning.      Marland Kitchen acetaminophen (TYLENOL) 500 MG tablet Take 500-1,000 mg by mouth as needed for mild pain.       . meclizine (ANTIVERT) 25 MG tablet Take 25 mg by mouth every 6 (six) hours as needed for dizziness.       . pantoprazole (PROTONIX) 40 MG tablet Take 40 mg by mouth daily as needed (acid reflux).       No current facility-administered medications on file prior to visit.    No Known Allergies  Past Medical History  Diagnosis Date  . Hypertension   . Hyperlipidemia   . Arthritis   . GERD (gastroesophageal reflux disease)   . History of transfusion   . Ovarian cancer     Past Surgical History  Procedure Laterality Date  . Joint replacement  2012    L KNEE  . Portacath placement    . Robotic assisted total hysterectomy with bilateral salpingo oopherectomy N/A 01/21/2014    Procedure: ROBOTIC ASSISTED TOTAL HYSTERECTOMY WITH BILATERAL SALPINGO OOPHORECTOMY with  omentectomy;  Surgeon: Everitt Amber, MD;  Location: [facility];  Service: Gynecology;  Laterality: N/A;    Family History  Problem Relation Age of Onset  . Hypertension Mother   . Cancer Mother 35    colon cancer  . Hypertension Father   . Heart disease Father   . Cancer Sister 26    Breast Cancer  . Alcohol abuse Brother   . Cancer Brother 60    Throat Cancer,lung cancer, smoker    History   Social History  . Marital Status: Divorced    Spouse Name: N/A    Number of Children: N/A  . Years of Education: N/A   Occupational History  . Housekeeper [employer]   Social History Main Topics  . Smoking status:  Current Some Day Smoker  . Smokeless tobacco: Not on file  . Alcohol Use: 0.0 oz/week    0 Glasses of wine per week     Comment: RARE  . Drug Use: No  . Sexual Activity: Not on file   Other Topics Concern  . Not on file   Social History Narrative   Regular exercise-yes   Caffeine use-no              Review of systems: Constitutional:  She has no weight gain or weight loss. She has no fever or chills. Eyes: No blurred vision Ears, Nose, Mouth, Throat: No dizziness, headaches or changes in hearing. No mouth sores. Cardiovascular: No chest pain, palpitations or edema. Respiratory:  No shortness of breath, wheezing or cough Gastrointestinal: She has normal bowel movements without diarrhea or constipation. She denies any nausea or vomiting. She denies blood in her stool or heart burn. Genitourinary:  She denies pelvic pain, pelvic pressure or changes in her urinary function. She has no hematuria, dysuria, or incontinence. She has no irregular vaginal bleeding or vaginal discharge Musculoskeletal: Denies muscle weakness or joint pains.  Skin:  She has no skin changes, rashes or itching Neurological:  Denies dizziness or headaches. No neuropathy, no numbness or tingling. Psychiatric:  She denies depression or anxiety. Hematologic/Lymphatic:   No easy bruising or bleeding   Physical Exam: There were no vitals taken for this visit. General: Well dressed, well nourished in no apparent distress.   HEENT:  Normocephalic and atraumatic, no lesions.  Extraocular muscles intact. Sclerae anicteric. Pupils equal, round, reactive. No mouth sores or ulcers. Thyroid is normal size, not nodular, midline. Skin:  No lesions or rashes. Breasts:  Soft, symmetric.  No skin or nipple changes.  No palpable LN or masses. Lungs:  Clear to auscultation bilaterally.  No wheezes. Cardiovascular:  Regular rate and rhythm.  No murmurs or rubs. Abdomen:  Soft, nontender, nondistended.  No palpable masses.  No  hepatosplenomegaly.  No ascites. Normal bowel sounds.  No hernias.  Incisions are healing well. The supraumbilical incision is separated at the skin but not infected and with no drainage. I applied steristrips to aid in its healing. Genitourinary: deferred Extremities: No cyanosis, clubbing or edema.  No calf tenderness or erythema. No palpable cords. Psychiatric: Mood and affect are appropriate. Neurological: Awake, alert and oriented x 3. Sensation is intact, no neuropathy.  Musculoskeletal: No pain, normal strength and range of motion.  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   S/p robotic hysterectomy, BSO and omentectomy on 01/21/14 after 4 cycles of neoadjuvant chemotherapy with platinum and taxane (dose dense) x 4 cycles.   Plan: 1)  Pathology reports reviewed today 2) Treatment counseling - I discussed that Jolissa may have a slightly better prognosis and anticipated complete response to chemotherapy with a diagnosis of ovarian vs endometrial cancer. I discussed that she is NED after surgery, but still has microscopic disease which requires consolidation with at least 3 additional cycles of platinum and taxane chemotherapy. She is cleared from a postoperative stand point to start this. She was given the opportunity to ask questions, which were answered to her satisfaction, and she is agreement with the above mentioned plan of care.  3)  Return to clinic after completion of chemotherapy to commence surveillance/post-treatment monitoring.  Donaciano Eva, MD

## 2014-01-31 ENCOUNTER — Other Ambulatory Visit: Payer: BC Managed Care – PPO

## 2014-01-31 ENCOUNTER — Ambulatory Visit: Payer: BC Managed Care – PPO

## 2014-02-03 ENCOUNTER — Other Ambulatory Visit: Payer: BC Managed Care – PPO

## 2014-02-03 ENCOUNTER — Ambulatory Visit: Payer: BC Managed Care – PPO | Admitting: Oncology

## 2014-02-07 ENCOUNTER — Ambulatory Visit: Payer: BC Managed Care – PPO

## 2014-02-09 ENCOUNTER — Other Ambulatory Visit: Payer: Self-pay | Admitting: Oncology

## 2014-02-09 DIAGNOSIS — C562 Malignant neoplasm of left ovary: Secondary | ICD-10-CM

## 2014-02-10 ENCOUNTER — Telehealth: Payer: Self-pay | Admitting: *Deleted

## 2014-02-10 ENCOUNTER — Encounter: Payer: Self-pay | Admitting: Oncology

## 2014-02-10 ENCOUNTER — Ambulatory Visit (HOSPITAL_BASED_OUTPATIENT_CLINIC_OR_DEPARTMENT_OTHER): Payer: BC Managed Care – PPO | Admitting: Oncology

## 2014-02-10 ENCOUNTER — Telehealth: Payer: Self-pay | Admitting: Oncology

## 2014-02-10 ENCOUNTER — Ambulatory Visit: Payer: BC Managed Care – PPO

## 2014-02-10 ENCOUNTER — Other Ambulatory Visit (HOSPITAL_BASED_OUTPATIENT_CLINIC_OR_DEPARTMENT_OTHER): Payer: BC Managed Care – PPO

## 2014-02-10 VITALS — BP 92/62 | HR 96 | Temp 97.9°F | Resp 18 | Ht 64.0 in | Wt 221.8 lb

## 2014-02-10 DIAGNOSIS — D649 Anemia, unspecified: Secondary | ICD-10-CM

## 2014-02-10 DIAGNOSIS — C562 Malignant neoplasm of left ovary: Secondary | ICD-10-CM

## 2014-02-10 DIAGNOSIS — Z95828 Presence of other vascular implants and grafts: Secondary | ICD-10-CM

## 2014-02-10 DIAGNOSIS — I10 Essential (primary) hypertension: Secondary | ICD-10-CM

## 2014-02-10 LAB — CBC WITH DIFFERENTIAL/PLATELET
BASO%: 0.6 % (ref 0.0–2.0)
Basophils Absolute: 0 10*3/uL (ref 0.0–0.1)
EOS ABS: 0.1 10*3/uL (ref 0.0–0.5)
EOS%: 2.1 % (ref 0.0–7.0)
HEMATOCRIT: 31.9 % — AB (ref 34.8–46.6)
HGB: 10.2 g/dL — ABNORMAL LOW (ref 11.6–15.9)
LYMPH%: 35.9 % (ref 14.0–49.7)
MCH: 28.2 pg (ref 25.1–34.0)
MCHC: 32.1 g/dL (ref 31.5–36.0)
MCV: 88 fL (ref 79.5–101.0)
MONO#: 0.5 10*3/uL (ref 0.1–0.9)
MONO%: 8.1 % (ref 0.0–14.0)
NEUT%: 53.3 % (ref 38.4–76.8)
NEUTROS ABS: 3.5 10*3/uL (ref 1.5–6.5)
Platelets: 277 10*3/uL (ref 145–400)
RBC: 3.63 10*6/uL — ABNORMAL LOW (ref 3.70–5.45)
RDW: 17.8 % — ABNORMAL HIGH (ref 11.2–14.5)
WBC: 6.5 10*3/uL (ref 3.9–10.3)
lymph#: 2.3 10*3/uL (ref 0.9–3.3)

## 2014-02-10 LAB — COMPREHENSIVE METABOLIC PANEL (CC13)
ALT: 10 U/L (ref 0–55)
ANION GAP: 9 meq/L (ref 3–11)
AST: 13 U/L (ref 5–34)
Albumin: 3.4 g/dL — ABNORMAL LOW (ref 3.5–5.0)
Alkaline Phosphatase: 67 U/L (ref 40–150)
BUN: 26 mg/dL (ref 7.0–26.0)
CO2: 25 mEq/L (ref 22–29)
CREATININE: 1.1 mg/dL (ref 0.6–1.1)
Calcium: 10.1 mg/dL (ref 8.4–10.4)
Chloride: 105 mEq/L (ref 98–109)
GLUCOSE: 103 mg/dL (ref 70–140)
Potassium: 4 mEq/L (ref 3.5–5.1)
Sodium: 140 mEq/L (ref 136–145)
Total Protein: 7.5 g/dL (ref 6.4–8.3)

## 2014-02-10 MED ORDER — HEPARIN SOD (PORK) LOCK FLUSH 100 UNIT/ML IV SOLN
500.0000 [IU] | Freq: Once | INTRAVENOUS | Status: AC
  Administered 2014-02-10: 500 [IU] via INTRAVENOUS
  Filled 2014-02-10: qty 5

## 2014-02-10 MED ORDER — SODIUM CHLORIDE 0.9 % IJ SOLN
10.0000 mL | INTRAMUSCULAR | Status: DC | PRN
  Administered 2014-02-10: 10 mL via INTRAVENOUS
  Filled 2014-02-10: qty 10

## 2014-02-10 NOTE — Telephone Encounter (Signed)
Per staff message and POF I have scheduled appts. Advised scheduler of appts. JMW  

## 2014-02-10 NOTE — Patient Instructions (Signed)

## 2014-02-10 NOTE — Progress Notes (Signed)
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 She tolerated surgery well, was transfused PRBCs on 01-23-14, and has been progressively improving since DC from hospital 01-23-14. She saw Dr Denman George on 01-30-14, with mild separation of wound but stable to resume chemotherapy recommended for at least 3 additional cycles. She continues lovenox for total 4 weeks.   Patient denies discomfort at wound or abdominal/ pelvic pain. Appetite is good, bowels are moving daily with prune juice and she has had no bleeding. She was able to attend church this weekend.   She has PAC. She had flu vaccine We have discussed genetics counseling due to high grade serous ovarian cancer, and she is in agreement with this referral.   Brother died of metastatic lung cancer around time of her recent surgery.   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 14cm adnexal mass.  Significant radiographic and chemical response to neoadjuvant platinum and taxane neoadjuvant chemotherapy x 4 cycles.  Interval cytoreduction to microscopic disease on 01/21/14. Pathology was most consistent with ovarian primary.     Endometrial cancer determined by uterine biopsy (Resolved)   09/16/2013 Initial Diagnosis Endometrial cancer determined by uterine biopsy, and endocervical biopsy    Ovarian cancer   09/23/2013 Initial Diagnosis Ovarian cancer   10/03/2013 - 01/04/2014 Neo-Adjuvant Chemotherapy dose dense paclitaxel and carboplatin x 4 cycles   01/21/2014 Surgery robotic hysterectomy, BSO, omentectomy, interval cytoreduction to microscopic disease  Patient had been in usual good  health until acute episode of abdominal pain in Dec 2014, which eventually resolved without medical attention. More recently she has had vague diffuse abdominal discomfort, low grade nausea and abdominal bloating. She was seen in June 2015 for first gyn exam in years, PAP with AGUS and follow up biopsies of endocervix and endometrium by Dr Pamala Hurry both with serous endometrial cancer (pathology from [facility] 09-12-13, case # [id number] to be scanned into this EMR). Biopsy of cervix had normal squamous epithelium. CA125 from [facility] 09-16-13 was [id number]. She was seen by Dr Denman George on 09-23-13, her exam remarkable for large pelvic mass minimally mobile and extending to umbilicus. She had CT CAP 09-26-13 had prominent but not enlarged juxtapericardiac lymph nodes, trace right pleural effusion, large amount of abnormal soft tissue thruout peritoneal cavity, predominately with omentum and especially LUQ, largest area 3.8 x 3.0 x 4.0 cm anterior to proximal descending colon, implants adjacent to liver, large complex multicystic lesion in pelvis inseperable from uterus and adnexae, with mass effect on bladder, questionable area in central aspect of dome of liver, borderline enlarged retroperitoneal nodes. Due to extent of disease on CT, Dr Denman George has recommended that treatment begin with chemotherapy, with consideration of interval debulking surgery depending on response to systemic treatment. Cycle one taxol carboplatin was given on 2-77-82, complicated by severe nausea/vomiting, constipation and severe taxol aches. Cycle 2 was changed to dose dense carbo taxol, day 1 on 11-01-13. She needed gCSF support by cycle 3. She had total 4 cycles of chemotherapy (one full dose carbo taxol and three dose dense carbo taxol) thru 01-03-14. She had complete interval debulking by Dr Denman George on 01-21-14. Plan is for 3 additional cycles of chemotherapy.  Review of systems as above, also: No fever. Bladder ok. No LE swelling. No  increased SOB or other respiratory symptoms. PAC ok. She denies orthostatic symptoms, is drinking fluids. Slight tingling right foot at night since surgery. Remainder of 10 point Review of Systems negative.  Objective:  Vital signs in last 24 hours:  BP 92/62 mmHg  Pulse 96  Temp(Src) 97.9 F (36.6 C) (Oral)  Resp 18  Ht 5\' 4"  (1.626 m)  Wt 221 lb 12.8 oz (100.608 kg)  BMI 38.05 kg/m2  Weight is down 8 lbs. BP noted, has had lisinopril?HCTZ today, Alert, oriented and appropriate. Ambulatory without assistance, able to get on and off exam table..  Alopecia  HEENT:PERRL, sclerae not icteric. Oral mucosa moist without lesions, posterior pharynx clear.  Neck supple. No JVD.  Lymphatics:no cervical,suraclavicular or inguinal adenopathy Resp: clear to auscultation bilaterally and normal percussion bilaterally Cardio: regular rate and rhythm. No gallop. GI: abdomen obese, soft, nontender, not distended, no mass or organomegaly. Normally active bowel sounds. Laparoscopic surgical incisions not remarkable other than area above umbilicus with very superficial opening 0.5 x 0.4 cm, no drainage, no tenderness or erythema. Musculoskeletal/ Extremities: without pitting edema, cords, tenderness Neuro: no significant peripheral neuropathy. Otherwise nonfocal Skin without rash, ecchymosis, petechiae Portacath-without erythema or tenderness  Lab Results:  Results for orders placed or performed in visit on 02/10/14  CBC with Differential  Result Value Ref Range   WBC 6.5 3.9 - 10.3 10e3/uL   NEUT# 3.5 1.5 - 6.5 10e3/uL   HGB 10.2 (L) 11.6 - 15.9 g/dL   HCT 31.9 (L) 34.8 - 46.6 %   Platelets 277 145 - 400 10e3/uL   MCV 88.0 79.5 - 101.0 fL   MCH 28.2 25.1 - 34.0 pg   MCHC 32.1 31.5 - 36.0 g/dL   RBC 3.63 (L) 3.70 - 5.45 10e6/uL   RDW 17.8 (H) 11.2 - 14.5 %   lymph# 2.3 0.9 - 3.3 10e3/uL   MONO# 0.5 0.1 - 0.9 10e3/uL   Eosinophils Absolute 0.1 0.0 - 0.5 10e3/uL   Basophils Absolute 0.0 0.0  - 0.1 10e3/uL   NEUT% 53.3 38.4 - 76.8 %   LYMPH% 35.9 14.0 - 49.7 %   MONO% 8.1 0.0 - 14.0 %   EOS% 2.1 0.0 - 7.0 %   BASO% 0.6 0.0 - 2.0 %    CMET available after visit normal with exception of Alb 3.4  Studies/Results: PATHOLOGY  LESSLIE, MOSSA R Collected: 01/21/2014 Client: [facility] Accession: [id number] Received: 01/21/2014 Everitt Amber, MD REPORT OF SURGICAL PATHOLOGFINAL DIAGNOSIS Diagnosis 1. Uterus, ovaries and fallopian tubes, with cervix - LEFT OVARY: RESIDUAL HIGH GRADE SEROUS CARCINOMA. TREATMENT EFFECT. SEE ONCOLOGY TABLE. - UTERUS: -ENDOMETRIUM: INACTIVE ENDOMETRIUM WITH CYSTIC CHANGES. NO HYPERPLASIA OR MALIGNANCY. -MYOMETRIUM: LEIOMYOMATA. SUPERFICIAL INVASION BY HIGH GRADE SEROUS CARCINOMA, SEE COMMENT. -SEROSA: INVASIVE HIGH GRADE SEROUS CARCINOMA, SEE COMMENT. - CERVIX: BENIGN SQUAMOUS AND ENDOCERVICAL MUCOSA. NO DYSPLASIA OR MALIGNANCY. - RIGHT ADNEXA: METASTATIC HIGH GRADE SEROUS CARCINOMA. 2. Omentum, resection for tumor - METASTATIC CARCINOMA. Microscopic Comment 1. OVARY Specimen(s): Uterus, bilateral ovaries and fallopian tubes, omentum. Procedure: (including lymph node sampling): Total hysterectomy and bilateral salpingo-oophorectomy, omentum resection. Primary tumor site (including laterality): Left ovary, see comment. Ovarian surface involvement: See comment. Ovarian capsule intact without fragmentation: Intact. Maximum tumor size (cm): 8.5 cm. Histologic type: Serous carcinoma. Grade: FIGO stage N/A. (high grade). Peritoneal implants: (specify invasive or non-invasive): Present (omentum), invasive. Pelvic extension (list additional structures on separate lines and if  involved): Uterus serosa and myometrium. Right adnexal tissue. Lymph nodes: number examined 0; number positive 0 TNM code: ypT3b, pNX, pMX FIGO Stage (based on pathologic findings, needs clinical correlation): IIIB Comments: There is a large cystic structure  identified adherent to the uterus. Microscopically, the cystic structure is largely fibrotic with hemosiderin, consistent with treatment effect. There is minimal residual ovarian tissue. There are several foci of residual high grade serous carcinoma. Tumor extends to the outside of the fibrotic cystic capsule. There is a focal section of adherent benign fallopian tube, as well as another portion of dilated tube. There is no evidence of carcinoma in the endometrium, lower uterine segment or cervix (multiple additional sections were submitted). The thickened area in the endometrium described grossly consists of cystic atrophy and underlying leiomyomata. There is direct extension of tumor into the uterus serosa and superficial myometrium where the cystic mass was adherent. There is a small focus of metastatic carcinoma in the right adnexal tissue. There are numerous omental deposits of invasive tumor (macroscopically visible on gross examination, <2 cm). Overall, these findings are consistent with a high grade serous carcinoma arising from the left ovary.    Medications: I have reviewed the patient's current medications. She will again stop lisinopril/HCTZ due to excessive decrease in BP, and this will be followed here and at her next appointment with Dr Jenny Reichmann  DISCUSSION: We have discussed findings at surgery; patient understands rationale for continuing chemotherapy and is in agreement with resuming treatments. We have discussed q 3 week vs dose dense chemo and she wants to continue weekly dose dense due to symptoms after initial treatment. She prefers Fridays, has family event on 11-14, so requests to start on 11-20.   Assessment/Plan: 1. pT3bNx high grade carcinoma of left ovary: post 4 cycles neoadjuvant carboplatin taxol from 10-11-13 thru 01-03-14, complete interval debulking 01-21-14, and for additional 3 cycles dose dense carbo taxol. Day 1 will be 02-21-14. Minimal wound separation improving.  Genetics referral. 2. PAC in 3.morbid obesity  4.post left total knee replacement for degenerative arthritis  5.past minimal tobacco 6. HTN: Resumed lisinopril HCTZ , however BP again is lower than optimal likely with additional weight loss around surgery. She will stop the antihypertensive for now and will push fluids. 7.constipation: controlled with present laxatives.  8.mild hypokalemia: improved on KCl 10 mEq daily, seemed related to HCTZ. Follow off that medication now.  9. Anemia: mutlifactorial, post transfusion 2 units PRBCs 7-17 and after surgery. Continues oral iron.  10.no GERD since on initial protonix and now carafate 11.allergic sinusitis and vertigo: resolved 12.flu vaccine done 13.slight neuropathy symptoms right foot since surgery: positional, follow.  All questions answered and patient is in agreement with recommendations and plan. Chemo orders entered, will use neupogen days 2,9,16.  Time spent 30 min including >50% counseling and coordination of care.    LIVESAY,LENNIS P, MD   02/10/2014, 9:45 AM

## 2014-02-10 NOTE — Telephone Encounter (Signed)
per pof to sch pt appt-gave pt copy of sch °

## 2014-02-11 ENCOUNTER — Telehealth: Payer: Self-pay | Admitting: *Deleted

## 2014-02-11 MED ORDER — DEXAMETHASONE 4 MG PO TABS: ORAL_TABLET | ORAL | Status: DC

## 2014-02-11 NOTE — Addendum Note (Signed)
Addended by: Christa See on: 02/11/2014 03:11 PM   Modules accepted: Orders

## 2014-02-11 NOTE — Telephone Encounter (Signed)
-----   Message from Gordy Levan, MD sent at 02/11/2014 11:40 AM EST ----- Please be sure she has premed decadron 20 mg with food 12 hrs prior to taxol, and antiemetics, to resume weekly chemo 02-21-14. Fine to do decadron #15 of 4 mg tabs, with will be for 3 weeks, 2 RF.

## 2014-02-11 NOTE — Telephone Encounter (Signed)
Left VM for patient asking her to call us back regarding refills on decadron and nausea medications so she will have them before starting chemo on 02/21/14. Also needed to know which pharmacy pt wanted scripts sent to.

## 2014-02-11 NOTE — Telephone Encounter (Signed)
Pt called back and states she only needs a refill on the decadron tablets. She states she has plenty zofran and ativan tablets left from previous chemo. Decadron sent to [facility] for pt.

## 2014-02-18 ENCOUNTER — Other Ambulatory Visit: Payer: Self-pay | Admitting: Oncology

## 2014-02-19 ENCOUNTER — Ambulatory Visit: Payer: BC Managed Care – PPO | Admitting: Internal Medicine

## 2014-02-21 ENCOUNTER — Ambulatory Visit (HOSPITAL_BASED_OUTPATIENT_CLINIC_OR_DEPARTMENT_OTHER): Payer: BC Managed Care – PPO

## 2014-02-21 ENCOUNTER — Other Ambulatory Visit (HOSPITAL_BASED_OUTPATIENT_CLINIC_OR_DEPARTMENT_OTHER): Payer: BC Managed Care – PPO

## 2014-02-21 ENCOUNTER — Ambulatory Visit: Payer: BC Managed Care – PPO

## 2014-02-21 ENCOUNTER — Other Ambulatory Visit: Payer: BC Managed Care – PPO

## 2014-02-21 VITALS — BP 148/96 | HR 97 | Temp 97.5°F

## 2014-02-21 DIAGNOSIS — C541 Malignant neoplasm of endometrium: Secondary | ICD-10-CM

## 2014-02-21 DIAGNOSIS — C562 Malignant neoplasm of left ovary: Secondary | ICD-10-CM

## 2014-02-21 DIAGNOSIS — Z5111 Encounter for antineoplastic chemotherapy: Secondary | ICD-10-CM

## 2014-02-21 DIAGNOSIS — Z95828 Presence of other vascular implants and grafts: Secondary | ICD-10-CM

## 2014-02-21 LAB — COMPREHENSIVE METABOLIC PANEL (CC13)
ALBUMIN: 3.6 g/dL (ref 3.5–5.0)
ALT: 11 U/L (ref 0–55)
AST: 14 U/L (ref 5–34)
Alkaline Phosphatase: 74 U/L (ref 40–150)
Anion Gap: 11 mEq/L (ref 3–11)
BILIRUBIN TOTAL: 0.21 mg/dL (ref 0.20–1.20)
BUN: 18.8 mg/dL (ref 7.0–26.0)
CO2: 22 mEq/L (ref 22–29)
Calcium: 10.1 mg/dL (ref 8.4–10.4)
Chloride: 106 mEq/L (ref 98–109)
Creatinine: 1 mg/dL (ref 0.6–1.1)
GLUCOSE: 149 mg/dL — AB (ref 70–140)
POTASSIUM: 3.7 meq/L (ref 3.5–5.1)
Sodium: 139 mEq/L (ref 136–145)
Total Protein: 7.7 g/dL (ref 6.4–8.3)

## 2014-02-21 LAB — CBC WITH DIFFERENTIAL/PLATELET
BASO%: 0 % (ref 0.0–2.0)
BASOS ABS: 0 10*3/uL (ref 0.0–0.1)
EOS%: 0 % (ref 0.0–7.0)
Eosinophils Absolute: 0 10*3/uL (ref 0.0–0.5)
HEMATOCRIT: 32.4 % — AB (ref 34.8–46.6)
HEMOGLOBIN: 10.5 g/dL — AB (ref 11.6–15.9)
LYMPH#: 1.1 10*3/uL (ref 0.9–3.3)
LYMPH%: 18.4 % (ref 14.0–49.7)
MCH: 28.2 pg (ref 25.1–34.0)
MCHC: 32.4 g/dL (ref 31.5–36.0)
MCV: 86.9 fL (ref 79.5–101.0)
MONO#: 0.1 10*3/uL (ref 0.1–0.9)
MONO%: 1.8 % (ref 0.0–14.0)
NEUT%: 79.8 % — ABNORMAL HIGH (ref 38.4–76.8)
NEUTROS ABS: 5 10*3/uL (ref 1.5–6.5)
Platelets: 215 10*3/uL (ref 145–400)
RBC: 3.73 10*6/uL (ref 3.70–5.45)
RDW: 16.2 % — ABNORMAL HIGH (ref 11.2–14.5)
WBC: 6.2 10*3/uL (ref 3.9–10.3)

## 2014-02-21 MED ORDER — DIPHENHYDRAMINE HCL 50 MG/ML IJ SOLN
INTRAMUSCULAR | Status: AC
  Filled 2014-02-21: qty 1

## 2014-02-21 MED ORDER — FAMOTIDINE IN NACL 20-0.9 MG/50ML-% IV SOLN
INTRAVENOUS | Status: AC
  Filled 2014-02-21: qty 50

## 2014-02-21 MED ORDER — SODIUM CHLORIDE 0.9 % IJ SOLN
10.0000 mL | INTRAMUSCULAR | Status: DC | PRN
  Administered 2014-02-21: 10 mL via INTRAVENOUS
  Filled 2014-02-21: qty 10

## 2014-02-21 MED ORDER — FAMOTIDINE IN NACL 20-0.9 MG/50ML-% IV SOLN
20.0000 mg | Freq: Once | INTRAVENOUS | Status: AC
  Administered 2014-02-21: 20 mg via INTRAVENOUS

## 2014-02-21 MED ORDER — DIPHENHYDRAMINE HCL 50 MG/ML IJ SOLN
50.0000 mg | Freq: Once | INTRAMUSCULAR | Status: AC
  Administered 2014-02-21: 50 mg via INTRAVENOUS

## 2014-02-21 MED ORDER — DEXAMETHASONE SODIUM PHOSPHATE 20 MG/5ML IJ SOLN
INTRAMUSCULAR | Status: AC
  Filled 2014-02-21: qty 5

## 2014-02-21 MED ORDER — DEXAMETHASONE SODIUM PHOSPHATE 20 MG/5ML IJ SOLN
12.0000 mg | Freq: Once | INTRAMUSCULAR | Status: AC
  Administered 2014-02-21: 12 mg via INTRAVENOUS

## 2014-02-21 MED ORDER — HEPARIN SOD (PORK) LOCK FLUSH 100 UNIT/ML IV SOLN
500.0000 [IU] | Freq: Once | INTRAVENOUS | Status: DC
  Filled 2014-02-21: qty 5

## 2014-02-21 MED ORDER — SODIUM CHLORIDE 0.9 % IV SOLN
150.0000 mg | Freq: Once | INTRAVENOUS | Status: AC
  Administered 2014-02-21: 150 mg via INTRAVENOUS
  Filled 2014-02-21: qty 5

## 2014-02-21 MED ORDER — PACLITAXEL CHEMO INJECTION 300 MG/50ML
80.0000 mg/m2 | Freq: Once | INTRAVENOUS | Status: AC
  Administered 2014-02-21: 168 mg via INTRAVENOUS
  Filled 2014-02-21: qty 28

## 2014-02-21 MED ORDER — SODIUM CHLORIDE 0.9 % IJ SOLN
10.0000 mL | INTRAMUSCULAR | Status: DC | PRN
  Administered 2014-02-21: 10 mL
  Filled 2014-02-21: qty 10

## 2014-02-21 MED ORDER — HEPARIN SOD (PORK) LOCK FLUSH 100 UNIT/ML IV SOLN
500.0000 [IU] | Freq: Once | INTRAVENOUS | Status: AC | PRN
Start: 2014-02-21 — End: 2014-02-21
  Administered 2014-02-21: 500 [IU]
  Filled 2014-02-21: qty 5

## 2014-02-21 MED ORDER — ONDANSETRON 16 MG/50ML IVPB (CHCC)
INTRAVENOUS | Status: AC
  Filled 2014-02-21: qty 16

## 2014-02-21 MED ORDER — ONDANSETRON 16 MG/50ML IVPB (CHCC)
16.0000 mg | Freq: Once | INTRAVENOUS | Status: AC
  Administered 2014-02-21: 16 mg via INTRAVENOUS

## 2014-02-21 MED ORDER — SODIUM CHLORIDE 0.9 % IV SOLN
535.5000 mg | Freq: Once | INTRAVENOUS | Status: AC
  Administered 2014-02-21: 540 mg via INTRAVENOUS
  Filled 2014-02-21: qty 54

## 2014-02-21 MED ORDER — SODIUM CHLORIDE 0.9 % IV SOLN
Freq: Once | INTRAVENOUS | Status: AC
  Administered 2014-02-21: 11:00:00 via INTRAVENOUS

## 2014-02-21 NOTE — Patient Instructions (Signed)
[facility] Discharge Instructions for Patients Receiving Chemotherapy  Today you received the following chemotherapy agents Paclitaxel/Carboplatin.   To help prevent nausea and vomiting after your treatment, we encourage you to take your nausea medication as directed.    If you develop nausea and vomiting that is not controlled by your nausea medication, call the clinic.   BELOW ARE SYMPTOMS THAT SHOULD BE REPORTED IMMEDIATELY:  *FEVER GREATER THAN 100.5 F  *CHILLS WITH OR WITHOUT FEVER  NAUSEA AND VOMITING THAT IS NOT CONTROLLED WITH YOUR NAUSEA MEDICATION  *UNUSUAL SHORTNESS OF BREATH  *UNUSUAL BRUISING OR BLEEDING  TENDERNESS IN MOUTH AND THROAT WITH OR WITHOUT PRESENCE OF ULCERS  *URINARY PROBLEMS  *BOWEL PROBLEMS  UNUSUAL RASH Items with * indicate a potential emergency and should be followed up as soon as possible.  Feel free to call the clinic you have any questions or concerns. The clinic phone number is [phone].    

## 2014-02-21 NOTE — Patient Instructions (Signed)

## 2014-02-22 ENCOUNTER — Ambulatory Visit (HOSPITAL_BASED_OUTPATIENT_CLINIC_OR_DEPARTMENT_OTHER): Payer: BC Managed Care – PPO

## 2014-02-22 DIAGNOSIS — C562 Malignant neoplasm of left ovary: Secondary | ICD-10-CM

## 2014-02-22 DIAGNOSIS — Z5189 Encounter for other specified aftercare: Secondary | ICD-10-CM

## 2014-02-22 DIAGNOSIS — C569 Malignant neoplasm of unspecified ovary: Secondary | ICD-10-CM

## 2014-02-22 MED ORDER — TBO-FILGRASTIM 300 MCG/0.5ML ~~LOC~~ SOSY
300.0000 ug | PREFILLED_SYRINGE | Freq: Once | SUBCUTANEOUS | Status: AC
  Administered 2014-02-22: 300 ug via SUBCUTANEOUS

## 2014-02-22 NOTE — Patient Instructions (Signed)

## 2014-02-28 ENCOUNTER — Ambulatory Visit (HOSPITAL_BASED_OUTPATIENT_CLINIC_OR_DEPARTMENT_OTHER): Payer: BC Managed Care – PPO

## 2014-02-28 ENCOUNTER — Other Ambulatory Visit: Payer: BC Managed Care – PPO

## 2014-02-28 ENCOUNTER — Other Ambulatory Visit (HOSPITAL_BASED_OUTPATIENT_CLINIC_OR_DEPARTMENT_OTHER): Payer: BC Managed Care – PPO

## 2014-02-28 ENCOUNTER — Ambulatory Visit: Payer: BC Managed Care – PPO

## 2014-02-28 DIAGNOSIS — C562 Malignant neoplasm of left ovary: Secondary | ICD-10-CM

## 2014-02-28 DIAGNOSIS — Z95828 Presence of other vascular implants and grafts: Secondary | ICD-10-CM

## 2014-02-28 DIAGNOSIS — C541 Malignant neoplasm of endometrium: Secondary | ICD-10-CM

## 2014-02-28 DIAGNOSIS — Z5111 Encounter for antineoplastic chemotherapy: Secondary | ICD-10-CM

## 2014-02-28 LAB — COMPREHENSIVE METABOLIC PANEL (CC13)
ALBUMIN: 3.7 g/dL (ref 3.5–5.0)
ALT: 11 U/L (ref 0–55)
AST: 19 U/L (ref 5–34)
Alkaline Phosphatase: 78 U/L (ref 40–150)
Anion Gap: 10 mEq/L (ref 3–11)
BILIRUBIN TOTAL: 0.29 mg/dL (ref 0.20–1.20)
BUN: 19.2 mg/dL (ref 7.0–26.0)
CO2: 22 mEq/L (ref 22–29)
Calcium: 10 mg/dL (ref 8.4–10.4)
Chloride: 108 mEq/L (ref 98–109)
Creatinine: 0.8 mg/dL (ref 0.6–1.1)
Glucose: 117 mg/dl (ref 70–140)
POTASSIUM: 4.1 meq/L (ref 3.5–5.1)
SODIUM: 140 meq/L (ref 136–145)
Total Protein: 7.5 g/dL (ref 6.4–8.3)

## 2014-02-28 LAB — CBC WITH DIFFERENTIAL/PLATELET
BASO%: 0 % (ref 0.0–2.0)
Basophils Absolute: 0 10*3/uL (ref 0.0–0.1)
EOS%: 0 % (ref 0.0–7.0)
Eosinophils Absolute: 0 10*3/uL (ref 0.0–0.5)
HCT: 31.2 % — ABNORMAL LOW (ref 34.8–46.6)
HGB: 10.1 g/dL — ABNORMAL LOW (ref 11.6–15.9)
LYMPH%: 22.2 % (ref 14.0–49.7)
MCH: 28.5 pg (ref 25.1–34.0)
MCHC: 32.4 g/dL (ref 31.5–36.0)
MCV: 87.9 fL (ref 79.5–101.0)
MONO#: 0 10*3/uL — ABNORMAL LOW (ref 0.1–0.9)
MONO%: 0.4 % (ref 0.0–14.0)
NEUT#: 3.7 10*3/uL (ref 1.5–6.5)
NEUT%: 77.4 % — ABNORMAL HIGH (ref 38.4–76.8)
Platelets: 225 10*3/uL (ref 145–400)
RBC: 3.55 10*6/uL — AB (ref 3.70–5.45)
RDW: 15.4 % — AB (ref 11.2–14.5)
WBC: 4.8 10*3/uL (ref 3.9–10.3)
lymph#: 1.1 10*3/uL (ref 0.9–3.3)

## 2014-02-28 MED ORDER — DIPHENHYDRAMINE HCL 50 MG/ML IJ SOLN
50.0000 mg | Freq: Once | INTRAMUSCULAR | Status: AC
  Administered 2014-02-28: 50 mg via INTRAVENOUS

## 2014-02-28 MED ORDER — HEPARIN SOD (PORK) LOCK FLUSH 100 UNIT/ML IV SOLN
500.0000 [IU] | Freq: Once | INTRAVENOUS | Status: AC | PRN
  Administered 2014-02-28: 500 [IU]
  Filled 2014-02-28: qty 5

## 2014-02-28 MED ORDER — DEXAMETHASONE SODIUM PHOSPHATE 20 MG/5ML IJ SOLN
20.0000 mg | Freq: Once | INTRAMUSCULAR | Status: AC
  Administered 2014-02-28: 20 mg via INTRAVENOUS

## 2014-02-28 MED ORDER — PACLITAXEL CHEMO INJECTION 300 MG/50ML
80.0000 mg/m2 | Freq: Once | INTRAVENOUS | Status: AC
  Administered 2014-02-28: 168 mg via INTRAVENOUS
  Filled 2014-02-28: qty 28

## 2014-02-28 MED ORDER — ONDANSETRON 8 MG/50ML IVPB (CHCC)
8.0000 mg | Freq: Once | INTRAVENOUS | Status: AC
  Administered 2014-02-28: 8 mg via INTRAVENOUS

## 2014-02-28 MED ORDER — DIPHENHYDRAMINE HCL 50 MG/ML IJ SOLN
INTRAMUSCULAR | Status: AC
  Filled 2014-02-28: qty 1

## 2014-02-28 MED ORDER — FAMOTIDINE IN NACL 20-0.9 MG/50ML-% IV SOLN
INTRAVENOUS | Status: AC
  Filled 2014-02-28: qty 50

## 2014-02-28 MED ORDER — ONDANSETRON 8 MG/NS 50 ML IVPB
INTRAVENOUS | Status: AC
  Filled 2014-02-28: qty 8

## 2014-02-28 MED ORDER — SODIUM CHLORIDE 0.9 % IJ SOLN
10.0000 mL | INTRAMUSCULAR | Status: DC | PRN
Start: 2014-02-28 — End: 2014-02-28
  Administered 2014-02-28: 10 mL via INTRAVENOUS
  Filled 2014-02-28: qty 10

## 2014-02-28 MED ORDER — SODIUM CHLORIDE 0.9 % IJ SOLN
10.0000 mL | INTRAMUSCULAR | Status: DC | PRN
  Administered 2014-02-28: 10 mL
  Filled 2014-02-28: qty 10

## 2014-02-28 MED ORDER — SODIUM CHLORIDE 0.9 % IV SOLN
Freq: Once | INTRAVENOUS | Status: AC
  Administered 2014-02-28: 10:00:00 via INTRAVENOUS

## 2014-02-28 MED ORDER — DEXAMETHASONE SODIUM PHOSPHATE 20 MG/5ML IJ SOLN
INTRAMUSCULAR | Status: AC
Start: 2014-02-28 — End: 2014-02-28
  Filled 2014-02-28: qty 5

## 2014-02-28 MED ORDER — FAMOTIDINE IN NACL 20-0.9 MG/50ML-% IV SOLN
20.0000 mg | Freq: Once | INTRAVENOUS | Status: AC
  Administered 2014-02-28: 20 mg via INTRAVENOUS

## 2014-02-28 NOTE — Patient Instructions (Signed)
[facility] Discharge Instructions for Patients Receiving Chemotherapy  Today you received the following chemotherapy agents: Taxol  To help prevent nausea and vomiting after your treatment, we encourage you to take your nausea medication as prescribed by your physician.  If you develop nausea and vomiting that is not controlled by your nausea medication, call the clinic.   BELOW ARE SYMPTOMS THAT SHOULD BE REPORTED IMMEDIATELY:  *FEVER GREATER THAN 100.5 F  *CHILLS WITH OR WITHOUT FEVER  NAUSEA AND VOMITING THAT IS NOT CONTROLLED WITH YOUR NAUSEA MEDICATION  *UNUSUAL SHORTNESS OF BREATH  *UNUSUAL BRUISING OR BLEEDING  TENDERNESS IN MOUTH AND THROAT WITH OR WITHOUT PRESENCE OF ULCERS  *URINARY PROBLEMS  *BOWEL PROBLEMS  UNUSUAL RASH Items with * indicate a potential emergency and should be followed up as soon as possible.  Feel free to call the clinic you have any questions or concerns. The clinic phone number is [phone].    

## 2014-02-28 NOTE — Patient Instructions (Signed)

## 2014-03-01 ENCOUNTER — Ambulatory Visit (HOSPITAL_BASED_OUTPATIENT_CLINIC_OR_DEPARTMENT_OTHER): Payer: BC Managed Care – PPO

## 2014-03-01 ENCOUNTER — Other Ambulatory Visit: Payer: Self-pay | Admitting: Oncology

## 2014-03-01 DIAGNOSIS — Z5189 Encounter for other specified aftercare: Secondary | ICD-10-CM

## 2014-03-01 DIAGNOSIS — C569 Malignant neoplasm of unspecified ovary: Secondary | ICD-10-CM

## 2014-03-01 DIAGNOSIS — C562 Malignant neoplasm of left ovary: Secondary | ICD-10-CM

## 2014-03-01 MED ORDER — TBO-FILGRASTIM 300 MCG/0.5ML ~~LOC~~ SOSY
300.0000 ug | PREFILLED_SYRINGE | Freq: Once | SUBCUTANEOUS | Status: AC
  Administered 2014-03-01: 300 ug via SUBCUTANEOUS

## 2014-03-01 NOTE — Patient Instructions (Signed)

## 2014-03-03 ENCOUNTER — Ambulatory Visit: Payer: BC Managed Care – PPO | Admitting: Oncology

## 2014-03-03 ENCOUNTER — Other Ambulatory Visit: Payer: BC Managed Care – PPO

## 2014-03-05 ENCOUNTER — Ambulatory Visit: Payer: BC Managed Care – PPO | Admitting: Oncology

## 2014-03-05 ENCOUNTER — Other Ambulatory Visit: Payer: BC Managed Care – PPO

## 2014-03-07 ENCOUNTER — Other Ambulatory Visit (HOSPITAL_BASED_OUTPATIENT_CLINIC_OR_DEPARTMENT_OTHER): Payer: BC Managed Care – PPO

## 2014-03-07 ENCOUNTER — Encounter: Payer: Self-pay | Admitting: Oncology

## 2014-03-07 ENCOUNTER — Other Ambulatory Visit: Payer: Self-pay | Admitting: *Deleted

## 2014-03-07 ENCOUNTER — Telehealth: Payer: Self-pay | Admitting: *Deleted

## 2014-03-07 ENCOUNTER — Ambulatory Visit: Payer: BC Managed Care – PPO

## 2014-03-07 ENCOUNTER — Other Ambulatory Visit: Payer: BC Managed Care – PPO

## 2014-03-07 ENCOUNTER — Telehealth: Payer: Self-pay | Admitting: Oncology

## 2014-03-07 ENCOUNTER — Ambulatory Visit (HOSPITAL_BASED_OUTPATIENT_CLINIC_OR_DEPARTMENT_OTHER): Payer: BC Managed Care – PPO

## 2014-03-07 ENCOUNTER — Ambulatory Visit (HOSPITAL_BASED_OUTPATIENT_CLINIC_OR_DEPARTMENT_OTHER): Payer: BC Managed Care – PPO | Admitting: Oncology

## 2014-03-07 VITALS — BP 133/83 | HR 90 | Temp 98.2°F | Resp 20 | Ht 64.0 in | Wt 224.6 lb

## 2014-03-07 DIAGNOSIS — Z5111 Encounter for antineoplastic chemotherapy: Secondary | ICD-10-CM

## 2014-03-07 DIAGNOSIS — D649 Anemia, unspecified: Secondary | ICD-10-CM

## 2014-03-07 DIAGNOSIS — C562 Malignant neoplasm of left ovary: Secondary | ICD-10-CM

## 2014-03-07 DIAGNOSIS — Z95828 Presence of other vascular implants and grafts: Secondary | ICD-10-CM

## 2014-03-07 DIAGNOSIS — E876 Hypokalemia: Secondary | ICD-10-CM

## 2014-03-07 DIAGNOSIS — T451X5A Adverse effect of antineoplastic and immunosuppressive drugs, initial encounter: Secondary | ICD-10-CM

## 2014-03-07 DIAGNOSIS — G622 Polyneuropathy due to other toxic agents: Secondary | ICD-10-CM

## 2014-03-07 DIAGNOSIS — G620 Drug-induced polyneuropathy: Secondary | ICD-10-CM

## 2014-03-07 LAB — COMPREHENSIVE METABOLIC PANEL (CC13)
ALBUMIN: 3.7 g/dL (ref 3.5–5.0)
ALK PHOS: 80 U/L (ref 40–150)
ALT: 16 U/L (ref 0–55)
AST: 19 U/L (ref 5–34)
Anion Gap: 13 mEq/L — ABNORMAL HIGH (ref 3–11)
BUN: 20.1 mg/dL (ref 7.0–26.0)
CHLORIDE: 105 meq/L (ref 98–109)
CO2: 22 mEq/L (ref 22–29)
Calcium: 10.9 mg/dL — ABNORMAL HIGH (ref 8.4–10.4)
Creatinine: 0.9 mg/dL (ref 0.6–1.1)
EGFR: 80 mL/min/{1.73_m2} — ABNORMAL LOW (ref >90)
Glucose: 109 mg/dl (ref 70–140)
POTASSIUM: 4.2 meq/L (ref 3.5–5.1)
SODIUM: 139 meq/L (ref 136–145)
Total Bilirubin: 0.22 mg/dL (ref 0.20–1.20)
Total Protein: 7.6 g/dL (ref 6.4–8.3)

## 2014-03-07 MED ORDER — ONDANSETRON 8 MG/50ML IVPB (CHCC)
8.0000 mg | Freq: Once | INTRAVENOUS | Status: AC
  Administered 2014-03-07: 8 mg via INTRAVENOUS

## 2014-03-07 MED ORDER — HEPARIN SOD (PORK) LOCK FLUSH 100 UNIT/ML IV SOLN
500.0000 [IU] | Freq: Once | INTRAVENOUS | Status: AC | PRN
  Administered 2014-03-07: 500 [IU]
  Filled 2014-03-07: qty 5

## 2014-03-07 MED ORDER — SODIUM CHLORIDE 0.9 % IJ SOLN
10.0000 mL | INTRAMUSCULAR | Status: DC | PRN
  Administered 2014-03-07: 10 mL via INTRAVENOUS
  Filled 2014-03-07: qty 10

## 2014-03-07 MED ORDER — SODIUM CHLORIDE 0.9 % IV SOLN
Freq: Once | INTRAVENOUS | Status: AC
  Administered 2014-03-07: 11:00:00 via INTRAVENOUS

## 2014-03-07 MED ORDER — DEXAMETHASONE SODIUM PHOSPHATE 20 MG/5ML IJ SOLN
20.0000 mg | Freq: Once | INTRAMUSCULAR | Status: AC
  Administered 2014-03-07: 20 mg via INTRAVENOUS

## 2014-03-07 MED ORDER — FAMOTIDINE IN NACL 20-0.9 MG/50ML-% IV SOLN
20.0000 mg | Freq: Once | INTRAVENOUS | Status: AC
  Administered 2014-03-07: 20 mg via INTRAVENOUS

## 2014-03-07 MED ORDER — PACLITAXEL CHEMO INJECTION 300 MG/50ML
80.0000 mg/m2 | Freq: Once | INTRAVENOUS | Status: AC
  Administered 2014-03-07: 168 mg via INTRAVENOUS
  Filled 2014-03-07: qty 28

## 2014-03-07 MED ORDER — DIPHENHYDRAMINE HCL 50 MG/ML IJ SOLN
50.0000 mg | Freq: Once | INTRAMUSCULAR | Status: AC
  Administered 2014-03-07: 50 mg via INTRAVENOUS

## 2014-03-07 MED ORDER — SODIUM CHLORIDE 0.9 % IJ SOLN
10.0000 mL | INTRAMUSCULAR | Status: DC | PRN
  Administered 2014-03-07: 10 mL
  Filled 2014-03-07: qty 10

## 2014-03-07 NOTE — Progress Notes (Signed)
OFFICE PROGRESS NOTE   03/07/2014   Physicians:Emma Nada Maclachlan, MD , Aloha Gell  INTERVAL HISTORY:   Patient is seen, alone for visit, in continuing attention to adjuvant chemotherapy in progress for high grade serous ovarian cancer, due day 15 dose dense carboplatin taxol today, planning 2 additional cycles then reevaluation. She is tolerating chemotherapy well overall, with neupogen support days 2,9,16. She will have repeat scans and will see Dr Denman George after this chemo completes. She is to see PCP Dr Jenny Reichmann in early Jan.   Patient notices intermittent numbness distal right foot moreso that left, better with movement; she has minimal neuropathy in tips of fingers bilaterally. Foot is not painful or swollen, no difficulty walking. Energy is good, including mopping floor. Appetite is good, without nausea and bowels moving regularly without laxative or stool softener. Only pain was briefly in right hip after mopping, better with tylenol. No abdominal or pelvic pain. No SOB.  She completed post op lovenox injections. She remains off antihypertensive.  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 14cm adnexal mass.  Significant radiographic and chemical response to neoadjuvant platinum and taxane neoadjuvant chemotherapy x 4 cycles.  Interval cytoreduction to microscopic disease on 01/21/14. Pathology was most consistent with ovarian primary.     Endometrial cancer determined by uterine biopsy (Resolved)   09/16/2013 Initial Diagnosis Endometrial cancer determined by uterine biopsy, and endocervical biopsy    Ovarian cancer   09/23/2013 Initial Diagnosis Ovarian cancer   10/03/2013 - 01/04/2014 Neo-Adjuvant Chemotherapy dose dense paclitaxel and carboplatin x 4 cycles   01/21/2014 Surgery robotic hysterectomy, BSO, omentectomy, interval  cytoreduction to microscopic disease  Patient had been in usual good health until acute episode of abdominal pain in Dec 2014, which eventually resolved without medical attention. More recently she has had vague diffuse abdominal discomfort, low grade nausea and abdominal bloating. She was seen in June 2015 for first gyn exam in years, PAP with AGUS and follow up biopsies of endocervix and endometrium by Dr Pamala Hurry both with serous endometrial cancer (pathology from [facility] 09-12-13, case # [id number] to be scanned into this EMR). Biopsy of cervix had normal squamous epithelium. CA125 from [facility] 09-16-13 was [phone]. She was seen by Dr Denman George on 09-23-13, her exam remarkable for large pelvic mass minimally mobile and extending to umbilicus. She had CT CAP 09-26-13 had prominent but not enlarged juxtapericardiac lymph nodes, trace right pleural effusion, large amount of abnormal soft tissue thruout peritoneal cavity, predominately with omentum and especially LUQ, largest area 3.8 x 3.0 x 4.0 cm anterior to proximal descending colon, implants adjacent to liver, large complex multicystic lesion in pelvis inseperable from uterus and adnexae, with mass effect on bladder, questionable area in central aspect of dome of liver, borderline enlarged retroperitoneal nodes. Due to extent of disease on CT, Dr Denman George has recommended that treatment begin with chemotherapy, with consideration of interval debulking surgery depending on response to systemic treatment. Cycle one taxol carboplatin was given on 07-05-45, complicated by severe nausea/vomiting, constipation and severe taxol aches. Cycle 2 was changed to dose dense carbo taxol, day 1 on 11-01-13. She needed gCSF support by cycle 3. She had total 4 cycles of chemotherapy (one full dose carbo taxol and three dose dense carbo taxol) thru 01-03-14. She had complete interval debulking by Dr Denman George on 01-21-14. Plan is for 3 additional  cycles of chemotherapy, day 1  02-21-2014.  Review of systems as above, also: No problems with PAC. Hair is growing back a little. No bleeding. No swelling LE. Sleeps well Remainder of 10 point Review of Systems negative.  Objective:  Vital signs in last 24 hours:  BP 133/83 mmHg  Pulse 90  Temp(Src) 98.2 F (36.8 C) (Oral)  Resp 20  Ht 5\' 4"  (1.626 m)  Wt 224 lb 9.6 oz (101.878 kg)  BMI 38.53 kg/m2  SpO2 100% Weight is up 2 lbs Alert, oriented and appropriate. Ambulatory without difficulty.  Partial alopecia  HEENT:PERRL, sclerae not icteric. Oral mucosa moist without lesions, posterior pharynx clear.  Neck supple. No JVD.  Lymphatics:no cervical,supraclavicular or inguinal adenopathy Resp: clear to auscultation bilaterally and normal percussion bilaterally Cardio: regular rate and rhythm. No gallop. GI: abdomen obese, soft, nontender, not distended, no mass or organomegaly. Normally active bowel sounds. Laparoscopic surgical incisions not remarkable. Musculoskeletal/ Extremities: without pitting edema, cords, tenderness Neuro:  peripheral neuropathy distal right foot as noted. Otherwise nonfocal. PSYCH appropriate mood and affect Skin without rash, ecchymosis, petechiae Portacath-without erythema or tenderness  Lab Results: CBC today has WBC 5.76, ANC 4.18, Hgb 10.2 and plt 225k CMET electrolytes WNL, creatinine 0.88, LFTs WNL, Ca 10.8  CA125 pending today, this 70 prior to surgery  Studies/Results:  No results found.  Medications: I have reviewed the patient's current medications. She can hold multivitamin if eating good balanced diet. Continue po iron. 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 With Christmas, treatment schedule for last 2 cycles will be: carbo taxol 12-11, taxol 12-18, taxol last week Dec, carbo taxol 1-8, taxol 1-15, taxol 1-22.  Neupogen/ granix day  after each treatment.  She understands that she will see Dr Denman George after 2 more cycles of chemo.  Assessment/Plan: 1. pT3bNx high grade carcinoma of left ovary: post 4 cycles neoadjuvant carboplatin taxol from 10-11-13 thru 01-03-14, complete interval debulking 01-21-14, and for additional total 3 cycles dose dense carbo taxol. Day 15 of first adjuvant cycle today, rest of chemo schedule as above..Genetics referral. 2. PAC in 3.morbid obesity  4.post left total knee replacement for degenerative arthritis  5.past minimal tobacco 6. HTN: BP low when resumed lisinopril/HCTZ after surgery, still holding. Note she has f/u with PCP Dr Jenny Reichmann in early Jan 7.constipation: controlled with present laxatives.  8.mild hypokalemia: improved on KCl 10 mEq daily, seemed related to HCTZ. Follow off that medication now.  9. Anemia: mutlifactorial, post transfusion 2 units PRBCs 7-17 and after surgery. Continues oral iron. Some variation but essentially stable, not obviously symptomatic now. Follow 10.no GERD, protonix listed on meds instead of carafate now 11.allergic sinusitis and vertigo: resolved 12.flu vaccine done 13. neuropathy symptoms right foot since surgery and with taxol. Encouraged exercise, follow.    Patient is in agreement with recommendations and plans as above. Chemo orders entered and confirmed. Time spent 25 min including >50% counseling and coordination of care.   LIVESAY,LENNIS P, MD   03/07/2014, 10:24 AM

## 2014-03-07 NOTE — Telephone Encounter (Signed)
Per staff message and POF I have scheduled appts. Advised scheduler of appts. JMW  

## 2014-03-07 NOTE — Telephone Encounter (Signed)
, °

## 2014-03-07 NOTE — Patient Instructions (Signed)

## 2014-03-08 ENCOUNTER — Other Ambulatory Visit: Payer: Self-pay | Admitting: Oncology

## 2014-03-08 ENCOUNTER — Ambulatory Visit (HOSPITAL_BASED_OUTPATIENT_CLINIC_OR_DEPARTMENT_OTHER): Payer: BC Managed Care – PPO

## 2014-03-08 DIAGNOSIS — C562 Malignant neoplasm of left ovary: Secondary | ICD-10-CM

## 2014-03-08 DIAGNOSIS — Z5189 Encounter for other specified aftercare: Secondary | ICD-10-CM

## 2014-03-08 LAB — CBC WITH DIFFERENTIAL/PLATELET
BASO%: 0.2 % (ref 0.0–2.0)
Basophils Absolute: 0 10*3/uL (ref 0.0–0.1)
EOS%: 0 % (ref 0.0–7.0)
Eosinophils Absolute: 0 10*3/uL (ref 0.0–0.5)
HCT: 31 % — ABNORMAL LOW (ref 34.8–46.6)
HGB: 10.2 g/dL — ABNORMAL LOW (ref 11.6–15.9)
LYMPH%: 22.6 % (ref 14.0–49.7)
MCH: 28.9 pg (ref 25.1–34.0)
MCHC: 32.9 g/dL (ref 31.5–36.0)
MCV: 87.8 fL (ref 79.5–101.0)
MONO#: 0.3 10*3/uL (ref 0.1–0.9)
MONO%: 4.7 % (ref 0.0–14.0)
NEUT#: 4.2 10*3/uL (ref 1.5–6.5)
NEUT%: 72.5 % (ref 38.4–76.8)
NRBC: 0 % (ref 0–0)
Platelets: 225 10*3/uL (ref 145–400)
RBC: 3.53 10*6/uL — AB (ref 3.70–5.45)
RDW: 16 % — AB (ref 11.2–14.5)
WBC: 5.8 10*3/uL (ref 3.9–10.3)
lymph#: 1.3 10*3/uL (ref 0.9–3.3)

## 2014-03-08 LAB — CA 125: CA 125: 13 U/mL (ref <35)

## 2014-03-08 MED ORDER — TBO-FILGRASTIM 300 MCG/0.5ML ~~LOC~~ SOSY
300.0000 ug | PREFILLED_SYRINGE | Freq: Once | SUBCUTANEOUS | Status: AC
  Administered 2014-03-08: 300 ug via SUBCUTANEOUS

## 2014-03-10 ENCOUNTER — Telehealth: Payer: Self-pay | Admitting: Oncology

## 2014-03-10 NOTE — Telephone Encounter (Signed)
, °

## 2014-03-14 ENCOUNTER — Other Ambulatory Visit: Payer: BC Managed Care – PPO

## 2014-03-14 ENCOUNTER — Ambulatory Visit: Payer: BC Managed Care – PPO

## 2014-03-14 ENCOUNTER — Ambulatory Visit (HOSPITAL_BASED_OUTPATIENT_CLINIC_OR_DEPARTMENT_OTHER): Payer: BC Managed Care – PPO

## 2014-03-14 ENCOUNTER — Other Ambulatory Visit (HOSPITAL_BASED_OUTPATIENT_CLINIC_OR_DEPARTMENT_OTHER): Payer: BC Managed Care – PPO

## 2014-03-14 DIAGNOSIS — C562 Malignant neoplasm of left ovary: Secondary | ICD-10-CM

## 2014-03-14 DIAGNOSIS — Z95828 Presence of other vascular implants and grafts: Secondary | ICD-10-CM

## 2014-03-14 DIAGNOSIS — Z5111 Encounter for antineoplastic chemotherapy: Secondary | ICD-10-CM

## 2014-03-14 LAB — COMPREHENSIVE METABOLIC PANEL (CC13)
ALT: 15 U/L (ref 0–55)
ANION GAP: 12 meq/L — AB (ref 3–11)
AST: 16 U/L (ref 5–34)
Albumin: 3.5 g/dL (ref 3.5–5.0)
Alkaline Phosphatase: 83 U/L (ref 40–150)
BUN: 19.8 mg/dL (ref 7.0–26.0)
CO2: 20 meq/L — AB (ref 22–29)
CREATININE: 0.9 mg/dL (ref 0.6–1.1)
Calcium: 9.6 mg/dL (ref 8.4–10.4)
Chloride: 107 mEq/L (ref 98–109)
EGFR: 75 mL/min/{1.73_m2} — ABNORMAL LOW (ref >90)
GLUCOSE: 132 mg/dL (ref 70–140)
Potassium: 4.1 mEq/L (ref 3.5–5.1)
Sodium: 140 mEq/L (ref 136–145)
Total Protein: 7 g/dL (ref 6.4–8.3)

## 2014-03-14 LAB — CBC WITH DIFFERENTIAL/PLATELET
BASO%: 0.1 % (ref 0.0–2.0)
Basophils Absolute: 0 10*3/uL (ref 0.0–0.1)
EOS ABS: 0 10*3/uL (ref 0.0–0.5)
EOS%: 0 % (ref 0.0–7.0)
HCT: 29.9 % — ABNORMAL LOW (ref 34.8–46.6)
HGB: 9.5 g/dL — ABNORMAL LOW (ref 11.6–15.9)
LYMPH%: 16.6 % (ref 14.0–49.7)
MCH: 28.6 pg (ref 25.1–34.0)
MCHC: 31.8 g/dL (ref 31.5–36.0)
MCV: 89.9 fL (ref 79.5–101.0)
MONO#: 0.1 10*3/uL (ref 0.1–0.9)
MONO%: 1.6 % (ref 0.0–14.0)
NEUT#: 4.2 10*3/uL (ref 1.5–6.5)
NEUT%: 81.7 % — AB (ref 38.4–76.8)
PLATELETS: 186 10*3/uL (ref 145–400)
RBC: 3.33 10*6/uL — ABNORMAL LOW (ref 3.70–5.45)
RDW: 17 % — ABNORMAL HIGH (ref 11.2–14.5)
WBC: 5.2 10*3/uL (ref 3.9–10.3)
lymph#: 0.9 10*3/uL (ref 0.9–3.3)

## 2014-03-14 MED ORDER — FAMOTIDINE IN NACL 20-0.9 MG/50ML-% IV SOLN
INTRAVENOUS | Status: AC
  Filled 2014-03-14: qty 50

## 2014-03-14 MED ORDER — DEXAMETHASONE SODIUM PHOSPHATE 20 MG/5ML IJ SOLN
INTRAMUSCULAR | Status: AC
  Filled 2014-03-14: qty 5

## 2014-03-14 MED ORDER — SODIUM CHLORIDE 0.9 % IV SOLN
626.5000 mg | Freq: Once | INTRAVENOUS | Status: AC
  Administered 2014-03-14: 630 mg via INTRAVENOUS
  Filled 2014-03-14: qty 63

## 2014-03-14 MED ORDER — SODIUM CHLORIDE 0.9 % IV SOLN
150.0000 mg | Freq: Once | INTRAVENOUS | Status: AC
  Administered 2014-03-14: 150 mg via INTRAVENOUS
  Filled 2014-03-14: qty 5

## 2014-03-14 MED ORDER — FAMOTIDINE IN NACL 20-0.9 MG/50ML-% IV SOLN
20.0000 mg | Freq: Once | INTRAVENOUS | Status: AC
  Administered 2014-03-14: 20 mg via INTRAVENOUS

## 2014-03-14 MED ORDER — HEPARIN SOD (PORK) LOCK FLUSH 100 UNIT/ML IV SOLN
500.0000 [IU] | Freq: Once | INTRAVENOUS | Status: AC | PRN
  Administered 2014-03-14: 500 [IU]
  Filled 2014-03-14: qty 5

## 2014-03-14 MED ORDER — DIPHENHYDRAMINE HCL 50 MG/ML IJ SOLN
50.0000 mg | Freq: Once | INTRAMUSCULAR | Status: AC
  Administered 2014-03-14: 50 mg via INTRAVENOUS

## 2014-03-14 MED ORDER — DEXAMETHASONE SODIUM PHOSPHATE 20 MG/5ML IJ SOLN
12.0000 mg | Freq: Once | INTRAMUSCULAR | Status: AC
  Administered 2014-03-14: 12 mg via INTRAVENOUS

## 2014-03-14 MED ORDER — SODIUM CHLORIDE 0.9 % IJ SOLN
10.0000 mL | INTRAMUSCULAR | Status: DC | PRN
  Administered 2014-03-14: 10 mL
  Filled 2014-03-14: qty 10

## 2014-03-14 MED ORDER — PACLITAXEL CHEMO INJECTION 300 MG/50ML
80.0000 mg/m2 | Freq: Once | INTRAVENOUS | Status: AC
  Administered 2014-03-14: 168 mg via INTRAVENOUS
  Filled 2014-03-14: qty 28

## 2014-03-14 MED ORDER — SODIUM CHLORIDE 0.9 % IJ SOLN
10.0000 mL | INTRAMUSCULAR | Status: DC | PRN
  Administered 2014-03-14: 10 mL via INTRAVENOUS
  Filled 2014-03-14: qty 10

## 2014-03-14 MED ORDER — ONDANSETRON 16 MG/50ML IVPB (CHCC)
INTRAVENOUS | Status: AC
  Filled 2014-03-14: qty 16

## 2014-03-14 MED ORDER — SODIUM CHLORIDE 0.9 % IV SOLN
Freq: Once | INTRAVENOUS | Status: AC
  Administered 2014-03-14: 14:00:00 via INTRAVENOUS

## 2014-03-14 MED ORDER — DIPHENHYDRAMINE HCL 50 MG/ML IJ SOLN
INTRAMUSCULAR | Status: AC
  Filled 2014-03-14: qty 1

## 2014-03-14 MED ORDER — ONDANSETRON 16 MG/50ML IVPB (CHCC)
16.0000 mg | Freq: Once | INTRAVENOUS | Status: AC
  Administered 2014-03-14: 16 mg via INTRAVENOUS

## 2014-03-14 NOTE — Patient Instructions (Signed)
[facility] Discharge Instructions for Patients Receiving Chemotherapy  Today you received the following chemotherapy agents taxol/carboplatin  To help prevent nausea and vomiting after your treatment, we encourage you to take your nausea medication as directed   If you develop nausea and vomiting that is not controlled by your nausea medication, call the clinic.   BELOW ARE SYMPTOMS THAT SHOULD BE REPORTED IMMEDIATELY:  *FEVER GREATER THAN 100.5 F  *CHILLS WITH OR WITHOUT FEVER  NAUSEA AND VOMITING THAT IS NOT CONTROLLED WITH YOUR NAUSEA MEDICATION  *UNUSUAL SHORTNESS OF BREATH  *UNUSUAL BRUISING OR BLEEDING  TENDERNESS IN MOUTH AND THROAT WITH OR WITHOUT PRESENCE OF ULCERS  *URINARY PROBLEMS  *BOWEL PROBLEMS  UNUSUAL RASH Items with * indicate a potential emergency and should be followed up as soon as possible.  Feel free to call the clinic you have any questions or concerns. The clinic phone number is [phone].  

## 2014-03-14 NOTE — Patient Instructions (Signed)

## 2014-03-15 ENCOUNTER — Ambulatory Visit (HOSPITAL_BASED_OUTPATIENT_CLINIC_OR_DEPARTMENT_OTHER): Payer: Self-pay

## 2014-03-15 DIAGNOSIS — C562 Malignant neoplasm of left ovary: Secondary | ICD-10-CM

## 2014-03-15 DIAGNOSIS — Z5189 Encounter for other specified aftercare: Secondary | ICD-10-CM

## 2014-03-15 MED ORDER — TBO-FILGRASTIM 300 MCG/0.5ML ~~LOC~~ SOSY
300.0000 ug | PREFILLED_SYRINGE | Freq: Once | SUBCUTANEOUS | Status: AC
  Administered 2014-03-15: 300 ug via SUBCUTANEOUS

## 2014-03-21 ENCOUNTER — Ambulatory Visit: Payer: BC Managed Care – PPO

## 2014-03-21 ENCOUNTER — Other Ambulatory Visit (HOSPITAL_BASED_OUTPATIENT_CLINIC_OR_DEPARTMENT_OTHER): Payer: BC Managed Care – PPO

## 2014-03-21 ENCOUNTER — Ambulatory Visit (HOSPITAL_BASED_OUTPATIENT_CLINIC_OR_DEPARTMENT_OTHER): Payer: BC Managed Care – PPO

## 2014-03-21 DIAGNOSIS — Z5111 Encounter for antineoplastic chemotherapy: Secondary | ICD-10-CM

## 2014-03-21 DIAGNOSIS — C562 Malignant neoplasm of left ovary: Secondary | ICD-10-CM

## 2014-03-21 DIAGNOSIS — Z95828 Presence of other vascular implants and grafts: Secondary | ICD-10-CM

## 2014-03-21 LAB — CBC WITH DIFFERENTIAL/PLATELET
BASO%: 0.1 % (ref 0.0–2.0)
BASOS ABS: 0 10*3/uL (ref 0.0–0.1)
EOS ABS: 0 10*3/uL (ref 0.0–0.5)
EOS%: 0 % (ref 0.0–7.0)
HCT: 29.1 % — ABNORMAL LOW (ref 34.8–46.6)
HGB: 9.3 g/dL — ABNORMAL LOW (ref 11.6–15.9)
LYMPH#: 1.2 10*3/uL (ref 0.9–3.3)
LYMPH%: 24.7 % (ref 14.0–49.7)
MCH: 28.5 pg (ref 25.1–34.0)
MCHC: 31.8 g/dL (ref 31.5–36.0)
MCV: 89.5 fL (ref 79.5–101.0)
MONO#: 0.2 10*3/uL (ref 0.1–0.9)
MONO%: 3.2 % (ref 0.0–14.0)
NEUT%: 72 % (ref 38.4–76.8)
NEUTROS ABS: 3.4 10*3/uL (ref 1.5–6.5)
Platelets: 144 10*3/uL — ABNORMAL LOW (ref 145–400)
RBC: 3.25 10*6/uL — AB (ref 3.70–5.45)
RDW: 17 % — AB (ref 11.2–14.5)
WBC: 4.7 10*3/uL (ref 3.9–10.3)

## 2014-03-21 LAB — COMPREHENSIVE METABOLIC PANEL (CC13)
ALBUMIN: 3.7 g/dL (ref 3.5–5.0)
ALT: 18 U/L (ref 0–55)
AST: 21 U/L (ref 5–34)
Alkaline Phosphatase: 88 U/L (ref 40–150)
Anion Gap: 11 mEq/L (ref 3–11)
BUN: 25 mg/dL (ref 7.0–26.0)
CALCIUM: 9.5 mg/dL (ref 8.4–10.4)
CHLORIDE: 110 meq/L — AB (ref 98–109)
CO2: 21 mEq/L — ABNORMAL LOW (ref 22–29)
Creatinine: 0.8 mg/dL (ref 0.6–1.1)
EGFR: 87 mL/min/{1.73_m2} — ABNORMAL LOW (ref >90)
Glucose: 109 mg/dl (ref 70–140)
POTASSIUM: 4 meq/L (ref 3.5–5.1)
SODIUM: 142 meq/L (ref 136–145)
Total Bilirubin: 0.23 mg/dL (ref 0.20–1.20)
Total Protein: 7.2 g/dL (ref 6.4–8.3)

## 2014-03-21 MED ORDER — SODIUM CHLORIDE 0.9 % IJ SOLN
10.0000 mL | INTRAMUSCULAR | Status: DC | PRN
  Administered 2014-03-21: 10 mL
  Filled 2014-03-21: qty 10

## 2014-03-21 MED ORDER — SODIUM CHLORIDE 0.9 % IJ SOLN
10.0000 mL | INTRAMUSCULAR | Status: DC | PRN
  Administered 2014-03-21: 10 mL via INTRAVENOUS
  Filled 2014-03-21: qty 10

## 2014-03-21 MED ORDER — DIPHENHYDRAMINE HCL 50 MG/ML IJ SOLN
50.0000 mg | Freq: Once | INTRAMUSCULAR | Status: AC
  Administered 2014-03-21: 50 mg via INTRAVENOUS

## 2014-03-21 MED ORDER — ONDANSETRON 8 MG/50ML IVPB (CHCC)
8.0000 mg | Freq: Once | INTRAVENOUS | Status: AC
  Administered 2014-03-21: 8 mg via INTRAVENOUS

## 2014-03-21 MED ORDER — HEPARIN SOD (PORK) LOCK FLUSH 100 UNIT/ML IV SOLN
500.0000 [IU] | Freq: Once | INTRAVENOUS | Status: AC | PRN
  Administered 2014-03-21: 500 [IU]
  Filled 2014-03-21: qty 5

## 2014-03-21 MED ORDER — FAMOTIDINE IN NACL 20-0.9 MG/50ML-% IV SOLN
20.0000 mg | Freq: Once | INTRAVENOUS | Status: AC
  Administered 2014-03-21: 20 mg via INTRAVENOUS

## 2014-03-21 MED ORDER — DEXTROSE 5 % IV SOLN
80.0000 mg/m2 | Freq: Once | INTRAVENOUS | Status: AC
  Administered 2014-03-21: 168 mg via INTRAVENOUS
  Filled 2014-03-21: qty 28

## 2014-03-21 MED ORDER — DEXAMETHASONE SODIUM PHOSPHATE 20 MG/5ML IJ SOLN
20.0000 mg | Freq: Once | INTRAMUSCULAR | Status: AC
  Administered 2014-03-21: 20 mg via INTRAVENOUS

## 2014-03-21 MED ORDER — SODIUM CHLORIDE 0.9 % IV SOLN
Freq: Once | INTRAVENOUS | Status: AC
  Administered 2014-03-21: 12:00:00 via INTRAVENOUS

## 2014-03-21 NOTE — Patient Instructions (Signed)

## 2014-03-21 NOTE — Patient Instructions (Signed)
Paclitaxel injection What is this medicine? PACLITAXEL (PAK li TAX el) is a chemotherapy drug. It targets fast dividing cells, like cancer cells, and causes these cells to die. This medicine is used to treat ovarian cancer, breast cancer, and other cancers. This medicine may be used for other purposes; ask your health care provider or pharmacist if you have questions. COMMON BRAND NAME(S): Onxol, Taxol What should I tell my health care provider before I take this medicine? They need to know if you have any of these conditions: -blood disorders -irregular heartbeat -infection (especially a virus infection such as chickenpox, cold sores, or herpes) -liver disease -previous or ongoing radiation therapy -an unusual or allergic reaction to paclitaxel, alcohol, polyoxyethylated castor oil, other chemotherapy agents, other medicines, foods, dyes, or preservatives -pregnant or trying to get pregnant -breast-feeding How should I use this medicine? This drug is given as an infusion into a vein. It is administered in a hospital or clinic by a specially trained health care professional. Talk to your pediatrician regarding the use of this medicine in children. Special care may be needed. Overdosage: If you think you have taken too much of this medicine contact a poison control center or emergency room at once. NOTE: This medicine is only for you. Do not share this medicine with others. What if I miss a dose? It is important not to miss your dose. Call your doctor or health care professional if you are unable to keep an appointment. What may interact with this medicine? Do not take this medicine with any of the following medications: -disulfiram -metronidazole This medicine may also interact with the following medications: -cyclosporine -diazepam -ketoconazole -medicines to increase blood counts like filgrastim, pegfilgrastim, sargramostim -other chemotherapy drugs like cisplatin, doxorubicin,  epirubicin, etoposide, teniposide, vincristine -quinidine -testosterone -vaccines -verapamil Talk to your doctor or health care professional before taking any of these medicines: -acetaminophen -aspirin -ibuprofen -ketoprofen -naproxen This list may not describe all possible interactions. Give your health care provider a list of all the medicines, herbs, non-prescription drugs, or dietary supplements you use. Also tell them if you smoke, drink alcohol, or use illegal drugs. Some items may interact with your medicine. What should I watch for while using this medicine? Your condition will be monitored carefully while you are receiving this medicine. You will need important blood work done while you are taking this medicine. This drug may make you feel generally unwell. This is not uncommon, as chemotherapy can affect healthy cells as well as cancer cells. Report any side effects. Continue your course of treatment even though you feel ill unless your doctor tells you to stop. In some cases, you may be given additional medicines to help with side effects. Follow all directions for their use. Call your doctor or health care professional for advice if you get a fever, chills or sore throat, or other symptoms of a cold or flu. Do not treat yourself. This drug decreases your body's ability to fight infections. Try to avoid being around people who are sick. This medicine may increase your risk to bruise or bleed. Call your doctor or health care professional if you notice any unusual bleeding. Be careful brushing and flossing your teeth or using a toothpick because you may get an infection or bleed more easily. If you have any dental work done, tell your dentist you are receiving this medicine. Avoid taking products that contain aspirin, acetaminophen, ibuprofen, naproxen, or ketoprofen unless instructed by your doctor. These medicines may hide a fever.   Do not become pregnant while taking this medicine.  Women should inform their doctor if they wish to become pregnant or think they might be pregnant. There is a potential for serious side effects to an unborn child. Talk to your health care professional or pharmacist for more information. Do not breast-feed an infant while taking this medicine. Men are advised not to father a child while receiving this medicine. What side effects may I notice from receiving this medicine? Side effects that you should report to your doctor or health care professional as soon as possible: -allergic reactions like skin rash, itching or hives, swelling of the face, lips, or tongue -low blood counts - This drug may decrease the number of white blood cells, red blood cells and platelets. You may be at increased risk for infections and bleeding. -signs of infection - fever or chills, cough, sore throat, pain or difficulty passing urine -signs of decreased platelets or bleeding - bruising, pinpoint red spots on the skin, black, tarry stools, nosebleeds -signs of decreased red blood cells - unusually weak or tired, fainting spells, lightheadedness -breathing problems -chest pain -high or low blood pressure -mouth sores -nausea and vomiting -pain, swelling, redness or irritation at the injection site -pain, tingling, numbness in the hands or feet -slow or irregular heartbeat -swelling of the ankle, feet, hands Side effects that usually do not require medical attention (report to your doctor or health care professional if they continue or are bothersome): -bone pain -complete hair loss including hair on your head, underarms, pubic hair, eyebrows, and eyelashes -changes in the color of fingernails -diarrhea -loosening of the fingernails -loss of appetite -muscle or joint pain -red flush to skin -sweating This list may not describe all possible side effects. Call your doctor for medical advice about side effects. You may report side effects to FDA at  1-800-FDA-1088. Where should I keep my medicine? This drug is given in a hospital or clinic and will not be stored at home. NOTE: This sheet is a summary. It may not cover all possible information. If you have questions about this medicine, talk to your doctor, pharmacist, or health care provider.  2015, [employer]vier/[employer] Standard. (2012-05-14 16:41:21)  

## 2014-03-22 ENCOUNTER — Ambulatory Visit: Payer: BC Managed Care – PPO

## 2014-03-22 ENCOUNTER — Ambulatory Visit (HOSPITAL_BASED_OUTPATIENT_CLINIC_OR_DEPARTMENT_OTHER): Payer: Self-pay

## 2014-03-22 DIAGNOSIS — C562 Malignant neoplasm of left ovary: Secondary | ICD-10-CM

## 2014-03-22 DIAGNOSIS — Z5189 Encounter for other specified aftercare: Secondary | ICD-10-CM

## 2014-03-22 MED ORDER — TBO-FILGRASTIM 300 MCG/0.5ML ~~LOC~~ SOSY
300.0000 ug | PREFILLED_SYRINGE | Freq: Once | SUBCUTANEOUS | Status: AC
  Administered 2014-03-22: 300 ug via SUBCUTANEOUS

## 2014-03-22 NOTE — Patient Instructions (Signed)

## 2014-03-26 ENCOUNTER — Other Ambulatory Visit: Payer: Self-pay | Admitting: *Deleted

## 2014-03-26 DIAGNOSIS — C569 Malignant neoplasm of unspecified ovary: Secondary | ICD-10-CM

## 2014-03-27 ENCOUNTER — Other Ambulatory Visit: Payer: BC Managed Care – PPO

## 2014-03-27 ENCOUNTER — Ambulatory Visit (HOSPITAL_BASED_OUTPATIENT_CLINIC_OR_DEPARTMENT_OTHER): Payer: Self-pay

## 2014-03-27 ENCOUNTER — Other Ambulatory Visit: Payer: Self-pay

## 2014-03-27 ENCOUNTER — Other Ambulatory Visit (HOSPITAL_BASED_OUTPATIENT_CLINIC_OR_DEPARTMENT_OTHER): Payer: Self-pay

## 2014-03-27 ENCOUNTER — Ambulatory Visit: Payer: Self-pay

## 2014-03-27 DIAGNOSIS — Z5111 Encounter for antineoplastic chemotherapy: Secondary | ICD-10-CM

## 2014-03-27 DIAGNOSIS — C562 Malignant neoplasm of left ovary: Secondary | ICD-10-CM

## 2014-03-27 DIAGNOSIS — Z95828 Presence of other vascular implants and grafts: Secondary | ICD-10-CM

## 2014-03-27 LAB — COMPREHENSIVE METABOLIC PANEL (CC13)
ALBUMIN: 3.8 g/dL (ref 3.5–5.0)
ALT: 19 U/L (ref 0–55)
ANION GAP: 9 meq/L (ref 3–11)
AST: 21 U/L (ref 5–34)
Alkaline Phosphatase: 79 U/L (ref 40–150)
BUN: 23.9 mg/dL (ref 7.0–26.0)
CO2: 24 mEq/L (ref 22–29)
Calcium: 9.2 mg/dL (ref 8.4–10.4)
Chloride: 107 mEq/L (ref 98–109)
Creatinine: 0.8 mg/dL (ref 0.6–1.1)
EGFR: 87 mL/min/{1.73_m2} — AB (ref >90)
GLUCOSE: 110 mg/dL (ref 70–140)
POTASSIUM: 4.1 meq/L (ref 3.5–5.1)
Sodium: 140 mEq/L (ref 136–145)
Total Bilirubin: 0.24 mg/dL (ref 0.20–1.20)
Total Protein: 7.3 g/dL (ref 6.4–8.3)

## 2014-03-27 LAB — CBC WITH DIFFERENTIAL/PLATELET
BASO%: 0.5 % (ref 0.0–2.0)
Basophils Absolute: 0 10*3/uL (ref 0.0–0.1)
EOS%: 0 % (ref 0.0–7.0)
Eosinophils Absolute: 0 10*3/uL (ref 0.0–0.5)
HEMATOCRIT: 29.4 % — AB (ref 34.8–46.6)
HGB: 9.3 g/dL — ABNORMAL LOW (ref 11.6–15.9)
LYMPH#: 1.2 10*3/uL (ref 0.9–3.3)
LYMPH%: 30 % (ref 14.0–49.7)
MCH: 28.5 pg (ref 25.1–34.0)
MCHC: 31.7 g/dL (ref 31.5–36.0)
MCV: 90.1 fL (ref 79.5–101.0)
MONO#: 0.2 10*3/uL (ref 0.1–0.9)
MONO%: 6.3 % (ref 0.0–14.0)
NEUT#: 2.5 10*3/uL (ref 1.5–6.5)
NEUT%: 63.2 % (ref 38.4–76.8)
Platelets: 154 10*3/uL (ref 145–400)
RBC: 3.26 10*6/uL — ABNORMAL LOW (ref 3.70–5.45)
RDW: 17.6 % — AB (ref 11.2–14.5)
WBC: 3.9 10*3/uL (ref 3.9–10.3)

## 2014-03-27 MED ORDER — HEPARIN SOD (PORK) LOCK FLUSH 100 UNIT/ML IV SOLN
500.0000 [IU] | Freq: Once | INTRAVENOUS | Status: AC | PRN
  Administered 2014-03-27: 500 [IU]
  Filled 2014-03-27: qty 5

## 2014-03-27 MED ORDER — PACLITAXEL CHEMO INJECTION 300 MG/50ML
80.0000 mg/m2 | Freq: Once | INTRAVENOUS | Status: AC
  Administered 2014-03-27: 168 mg via INTRAVENOUS
  Filled 2014-03-27: qty 28

## 2014-03-27 MED ORDER — ONDANSETRON 8 MG/NS 50 ML IVPB
INTRAVENOUS | Status: AC
  Filled 2014-03-27: qty 8

## 2014-03-27 MED ORDER — SODIUM CHLORIDE 0.9 % IJ SOLN
10.0000 mL | INTRAMUSCULAR | Status: DC | PRN
  Administered 2014-03-27: 10 mL via INTRAVENOUS
  Filled 2014-03-27: qty 10

## 2014-03-27 MED ORDER — DEXAMETHASONE SODIUM PHOSPHATE 20 MG/5ML IJ SOLN
20.0000 mg | Freq: Once | INTRAMUSCULAR | Status: AC
  Administered 2014-03-27: 20 mg via INTRAVENOUS

## 2014-03-27 MED ORDER — SODIUM CHLORIDE 0.9 % IJ SOLN
10.0000 mL | INTRAMUSCULAR | Status: DC | PRN
  Administered 2014-03-27: 10 mL
  Filled 2014-03-27: qty 10

## 2014-03-27 MED ORDER — ONDANSETRON 8 MG/50ML IVPB (CHCC)
8.0000 mg | Freq: Once | INTRAVENOUS | Status: AC
  Administered 2014-03-27: 8 mg via INTRAVENOUS

## 2014-03-27 MED ORDER — FAMOTIDINE IN NACL 20-0.9 MG/50ML-% IV SOLN
20.0000 mg | Freq: Once | INTRAVENOUS | Status: AC
  Administered 2014-03-27: 20 mg via INTRAVENOUS

## 2014-03-27 MED ORDER — DEXAMETHASONE SODIUM PHOSPHATE 20 MG/5ML IJ SOLN
INTRAMUSCULAR | Status: AC
  Filled 2014-03-27: qty 5

## 2014-03-27 MED ORDER — DIPHENHYDRAMINE HCL 50 MG/ML IJ SOLN
50.0000 mg | Freq: Once | INTRAMUSCULAR | Status: AC
  Administered 2014-03-27: 50 mg via INTRAVENOUS

## 2014-03-27 MED ORDER — SODIUM CHLORIDE 0.9 % IV SOLN
Freq: Once | INTRAVENOUS | Status: AC
  Administered 2014-03-27: 11:00:00 via INTRAVENOUS

## 2014-03-27 MED ORDER — DIPHENHYDRAMINE HCL 50 MG/ML IJ SOLN
INTRAMUSCULAR | Status: AC
  Filled 2014-03-27: qty 1

## 2014-03-27 MED ORDER — FAMOTIDINE IN NACL 20-0.9 MG/50ML-% IV SOLN
INTRAVENOUS | Status: AC
  Filled 2014-03-27: qty 50

## 2014-03-27 NOTE — Patient Instructions (Signed)
[facility] Discharge Instructions for Patients Receiving Chemotherapy  Today you received the following chemotherapy agent: taxol  To help prevent nausea and vomiting after your treatment, we encourage you to take your nausea medications as directed:  Zofran 8 mg every 8 hours as needed  Ativan 0.5 mg- 1 mg every 6 hours as needed  If you develop nausea and vomiting that is not controlled by your nausea medication, call the clinic.   BELOW ARE SYMPTOMS THAT SHOULD BE REPORTED IMMEDIATELY:  *FEVER GREATER THAN 100.5 F  *CHILLS WITH OR WITHOUT FEVER  NAUSEA AND VOMITING THAT IS NOT CONTROLLED WITH YOUR NAUSEA MEDICATION  *UNUSUAL SHORTNESS OF BREATH  *UNUSUAL BRUISING OR BLEEDING  TENDERNESS IN MOUTH AND THROAT WITH OR WITHOUT PRESENCE OF ULCERS  *URINARY PROBLEMS  *BOWEL PROBLEMS  UNUSUAL RASH Items with * indicate a potential emergency and should be followed up as soon as possible.  Feel free to call the clinic should you have any questions or concerns. The clinic phone number is [phone].  It has been a pleasure to serve you today!

## 2014-03-27 NOTE — Patient Instructions (Signed)

## 2014-03-29 ENCOUNTER — Ambulatory Visit (HOSPITAL_BASED_OUTPATIENT_CLINIC_OR_DEPARTMENT_OTHER): Payer: 59

## 2014-03-29 DIAGNOSIS — C562 Malignant neoplasm of left ovary: Secondary | ICD-10-CM

## 2014-03-29 DIAGNOSIS — Z5189 Encounter for other specified aftercare: Secondary | ICD-10-CM

## 2014-03-29 DIAGNOSIS — C569 Malignant neoplasm of unspecified ovary: Secondary | ICD-10-CM

## 2014-03-29 MED ORDER — TBO-FILGRASTIM 300 MCG/0.5ML ~~LOC~~ SOSY
300.0000 ug | PREFILLED_SYRINGE | Freq: Once | SUBCUTANEOUS | Status: AC
  Administered 2014-03-29: 300 ug via SUBCUTANEOUS

## 2014-03-30 ENCOUNTER — Other Ambulatory Visit: Payer: Self-pay | Admitting: Oncology

## 2014-03-30 DIAGNOSIS — C569 Malignant neoplasm of unspecified ovary: Secondary | ICD-10-CM

## 2014-03-31 ENCOUNTER — Telehealth: Payer: Self-pay | Admitting: *Deleted

## 2014-03-31 NOTE — Telephone Encounter (Signed)
Orders received from Dr. Marko Plume for patient to try steam showers or run a humidifier.  Try saline nose spray every 30 minutes while awake to help the nose bleeds.  The dizziness is not new and no new orders.  Was able to reach patient at this time.  She verbalized understanding with teach back method used.  Will come to [facility] 04-02-2014 for next F/U.

## 2014-03-31 NOTE — Telephone Encounter (Signed)
   Provider input needed: dizziness, dry sinuses, nose bleed with blowing nose   Reason for call: "I would like Dr. Marko Plume to call something in for my sinuses.  Constitutional: positive for dizziness Ears, nose, mouth, throat, and face: positive for "blood out right side of nose when I blow"   ALLERGIES:  has No Known Allergies.  Patient last received chemotherapy/ treatment on 03-27-2014 C5,D15 Taxol/Carboplatin  Patient was last seen in the office on 03-07-2014  Next appt is 04-02-2014  Is patient having fevers greater than 100.5?  no   Is patient having uncontrolled pain, or new pain? no   Is patient having new back pain that changes with position (worsens or eases when laying down?)  no   Is patient able to eat and drink? yes    Is patient able to pass stool without difficulty?   yes     Is patient having uncontrolled nausea?  no    patient calls 03/31/2014 with complaint of  Constitutional: positive for "I am dizzy, woozy, was told to get something over the counter befor when this happened but I don't know what it was."  Denies having meclizine on hand and has stopped lisinoril for B/P about a month ago.   Ears, nose, mouth, throat, and face: positive for "Nose bleeds out right side for a while now.  They say it may be ryness from the heat but I think my sinuses are blocked.  If she could call something in.     Summary Based on the above information advised patient to  Await return call [phone]) as I will give this information to provider.   Melissa Ball, Melissa Ball  03/31/2014, 9:33 AM   Background Info  Melissa Ball   DOB: 1950-05-02   MR#: [id number]   CSN#   [id number] 03/31/2014

## 2014-04-01 ENCOUNTER — Encounter: Payer: Self-pay | Admitting: Oncology

## 2014-04-01 NOTE — Progress Notes (Signed)
Taxol and carboplatin are not replaceable drugs °

## 2014-04-02 ENCOUNTER — Telehealth: Payer: Self-pay | Admitting: Oncology

## 2014-04-02 ENCOUNTER — Ambulatory Visit: Payer: BC Managed Care – PPO | Admitting: Oncology

## 2014-04-02 ENCOUNTER — Other Ambulatory Visit (HOSPITAL_BASED_OUTPATIENT_CLINIC_OR_DEPARTMENT_OTHER): Payer: Medicare Other

## 2014-04-02 ENCOUNTER — Other Ambulatory Visit: Payer: BC Managed Care – PPO

## 2014-04-02 ENCOUNTER — Ambulatory Visit (HOSPITAL_BASED_OUTPATIENT_CLINIC_OR_DEPARTMENT_OTHER): Payer: Medicare Other | Admitting: Oncology

## 2014-04-02 ENCOUNTER — Encounter: Payer: Self-pay | Admitting: Oncology

## 2014-04-02 ENCOUNTER — Ambulatory Visit (HOSPITAL_BASED_OUTPATIENT_CLINIC_OR_DEPARTMENT_OTHER): Payer: Medicare Other

## 2014-04-02 VITALS — BP 102/73 | HR 94 | Temp 98.7°F | Resp 20 | Ht 64.0 in | Wt 227.4 lb

## 2014-04-02 DIAGNOSIS — E669 Obesity, unspecified: Secondary | ICD-10-CM

## 2014-04-02 DIAGNOSIS — C562 Malignant neoplasm of left ovary: Secondary | ICD-10-CM

## 2014-04-02 DIAGNOSIS — Z95828 Presence of other vascular implants and grafts: Secondary | ICD-10-CM

## 2014-04-02 DIAGNOSIS — G629 Polyneuropathy, unspecified: Secondary | ICD-10-CM

## 2014-04-02 DIAGNOSIS — D649 Anemia, unspecified: Secondary | ICD-10-CM

## 2014-04-02 LAB — CBC WITH DIFFERENTIAL/PLATELET
BASO%: 0.2 % (ref 0.0–2.0)
BASOS ABS: 0 10*3/uL (ref 0.0–0.1)
EOS%: 0 % (ref 0.0–7.0)
Eosinophils Absolute: 0 10*3/uL (ref 0.0–0.5)
HCT: 27.3 % — ABNORMAL LOW (ref 34.8–46.6)
HEMOGLOBIN: 8.8 g/dL — AB (ref 11.6–15.9)
LYMPH%: 45.9 % (ref 14.0–49.7)
MCH: 28.7 pg (ref 25.1–34.0)
MCHC: 32.2 g/dL (ref 31.5–36.0)
MCV: 88.9 fL (ref 79.5–101.0)
MONO#: 0.4 10*3/uL (ref 0.1–0.9)
MONO%: 6.6 % (ref 0.0–14.0)
NEUT#: 2.6 10*3/uL (ref 1.5–6.5)
NEUT%: 47.3 % (ref 38.4–76.8)
Platelets: 138 10*3/uL — ABNORMAL LOW (ref 145–400)
RBC: 3.07 10*6/uL — AB (ref 3.70–5.45)
RDW: 17.8 % — AB (ref 11.2–14.5)
WBC: 5.5 10*3/uL (ref 3.9–10.3)
lymph#: 2.5 10*3/uL (ref 0.9–3.3)

## 2014-04-02 LAB — COMPREHENSIVE METABOLIC PANEL (CC13)
ALK PHOS: 83 U/L (ref 40–150)
ALT: 15 U/L (ref 0–55)
AST: 18 U/L (ref 5–34)
Albumin: 3.5 g/dL (ref 3.5–5.0)
Anion Gap: 8 mEq/L (ref 3–11)
BUN: 18 mg/dL (ref 7.0–26.0)
CALCIUM: 9.5 mg/dL (ref 8.4–10.4)
CHLORIDE: 103 meq/L (ref 98–109)
CO2: 29 mEq/L (ref 22–29)
CREATININE: 0.8 mg/dL (ref 0.6–1.1)
EGFR: 86 mL/min/{1.73_m2} — AB (ref >90)
GLUCOSE: 102 mg/dL (ref 70–140)
POTASSIUM: 3.7 meq/L (ref 3.5–5.1)
Sodium: 140 mEq/L (ref 136–145)
Total Bilirubin: 0.3 mg/dL (ref 0.20–1.20)
Total Protein: 6.6 g/dL (ref 6.4–8.3)

## 2014-04-02 MED ORDER — HEPARIN SOD (PORK) LOCK FLUSH 100 UNIT/ML IV SOLN
500.0000 [IU] | Freq: Once | INTRAVENOUS | Status: AC
  Administered 2014-04-02: 500 [IU] via INTRAVENOUS
  Filled 2014-04-02: qty 5

## 2014-04-02 MED ORDER — SODIUM CHLORIDE 0.9 % IJ SOLN
10.0000 mL | INTRAMUSCULAR | Status: DC | PRN
  Administered 2014-04-02: 10 mL via INTRAVENOUS
  Filled 2014-04-02: qty 10

## 2014-04-02 NOTE — Telephone Encounter (Signed)
per pof to sch pt appt-Kim sch Denman George appt-CS to call & sch CT-pt has copy of sch

## 2014-04-02 NOTE — Patient Instructions (Signed)

## 2014-04-02 NOTE — Progress Notes (Signed)
Per recording..insurance -- [employer] effective 03/04/14.

## 2014-04-02 NOTE — Progress Notes (Signed)
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 ANC is maintaining with granix day after each chemo. She will have repeat CT scans and follow up with Dr Denman George after cycle 6.  Patient felt well thru Christmas, with son and his family visiting. She has had occasional slight vertigo similar to Sept, CT head then not remarkable and symptoms thought from inner ear. Meclizine was helpful then and she will try that again. She has had clear rhinorrhea and slight blood/ uncomfortable dryness in nares, some better with saline rinses.  Appetite is good, no nausea. No abdominal or pelvic discomfort. Slight numbness in right foot unchanged. No fever. No lower respiratory symptoms. No other bleeding.  PAC in Flu vaccine done Needs genetics counseling.  If post treatment scans look good, she may be candidate for GOG diet and exercise study, not discussed yet.   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 14cm adnexal mass.  Significant radiographic and chemical response to neoadjuvant platinum and taxane neoadjuvant chemotherapy x 4 cycles.  Interval cytoreduction to microscopic disease on 01/21/14. Pathology was most consistent with ovarian primary.     Endometrial cancer determined by uterine biopsy (Resolved)   09/16/2013 Initial Diagnosis Endometrial cancer determined by uterine biopsy, and endocervical biopsy    Ovarian cancer   09/23/2013 Initial Diagnosis Ovarian cancer   10/03/2013 - 01/04/2014 Neo-Adjuvant Chemotherapy dose dense paclitaxel and carboplatin x 4 cycles   01/21/2014 Surgery robotic hysterectomy, BSO, omentectomy, interval cytoreduction to microscopic disease   Patient had been in usual good health until acute episode of abdominal pain in Dec 2014, which eventually resolved without medical attention. More recently she has had vague diffuse abdominal discomfort, low grade nausea and abdominal bloating. She was seen in June 2015 for first gyn exam in years, PAP with AGUS and follow up biopsies of endocervix and endometrium by Dr Pamala Hurry both with serous endometrial cancer (pathology from [facility] 09-12-13, case # [id number] to be scanned into this EMR). Biopsy of cervix had normal squamous epithelium. CA125 from [facility] 09-16-13 was 964, and was up to 1406 by 10-23-13.Marland Kitchen She was seen by Dr Denman George on 09-23-13, her exam remarkable for large pelvic mass minimally mobile and extending to umbilicus. She had CT CAP 09-26-13 had prominent but not enlarged juxtapericardiac lymph nodes, trace right pleural effusion, large amount of abnormal soft tissue thruout peritoneal cavity, predominately with omentum and especially LUQ, largest area 3.8 x 3.0 x 4.0 cm anterior to proximal descending colon, implants adjacent to liver, large complex multicystic lesion in pelvis inseperable from uterus and adnexae, with mass effect on bladder, questionable area in central aspect of dome of liver, borderline enlarged retroperitoneal nodes. Due to extent of disease on CT, Dr Denman George has recommended that treatment begin with chemotherapy, with consideration of interval debulking surgery depending on response to systemic treatment. Cycle one taxol carboplatin was given on 9-93-71, complicated by severe nausea/vomiting, constipation and severe taxol aches. Cycle 2 was changed to dose dense carbo taxol, day 1 on 11-01-13. She needed gCSF support by cycle 3. 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 She resumed chemo with  cycle 4 on 02-21-14.  Review of systems as above, also: Bowels moving regularly. Bladder ok. No LE  swelling. No fever or symptoms of infection. Energy fairly good. Remainder of 10 point Review of Systems negative.  Objective:  Vital signs in last 24 hours:  BP 102/73 mmHg  Pulse 94  Temp(Src) 98.7 F (37.1 C) (Oral)  Resp 20  Ht 5' 4"  (1.626 m)  Wt 227 lb 6.4 oz (103.148 kg)  BMI 39.01 kg/m2  SpO2 100% Weight up 3 lbs Alert, oriented and appropriate. Ambulatory without difficulty.  Partial alopecia  HEENT:PERRL, sclerae not icteric. Oral mucosa moist without lesions, posterior pharynx with dull erythema bilaterally, no exudate. Nares raw upper septum bilaterally, no bleeding. No nystagmus. Neck supple. No JVD.  Lymphatics:no cervical,supraclavicular or inguinal adenopathy Resp: clear to auscultation bilaterally and normal percussion bilaterally Cardio: regular rate and rhythm. No gallop. GI: abdomen obese, soft, nontender, not distended, no appreciable mass or organomegaly. Normally active bowel sounds. Surgical incision not remarkable. Musculoskeletal/ Extremities: without pitting edema, cords, tenderness Neuro: no change peripheral neuropathy right foot. Speech fluent and appropriate. Gait unremarkable. Strength symmetrical. CN intact. PSYCH: appropriate mood and affect Skin without rash, ecchymosis, petechiae  Portacath-without erythema or tenderness  Lab Results:  Results for orders placed or performed in visit on 04/02/14  CBC with Differential  Result Value Ref Range   WBC 5.5 3.9 - 10.3 10e3/uL   NEUT# 2.6 1.5 - 6.5 10e3/uL   HGB 8.8 (L) 11.6 - 15.9 g/dL   HCT 27.3 (L) 34.8 - 46.6 %   Platelets 138 (L) 145 - 400 10e3/uL   MCV 88.9 79.5 - 101.0 fL   MCH 28.7 25.1 - 34.0 pg   MCHC 32.2 31.5 - 36.0 g/dL   RBC 3.07 (L) 3.70 - 5.45 10e6/uL   RDW 17.8 (H) 11.2 - 14.5 %   lymph# 2.5 0.9 - 3.3 10e3/uL   MONO# 0.4 0.1 - 0.9 10e3/uL   Eosinophils Absolute 0.0 0.0 - 0.5 10e3/uL   Basophils Absolute 0.0 0.0 - 0.1 10e3/uL   NEUT% 47.3 38.4 - 76.8 %   LYMPH% 45.9 14.0  - 49.7 %   MONO% 6.6 0.0 - 14.0 %   EOS% 0.0 0.0 - 7.0 %   BASO% 0.2 0.0 - 2.0 %  Comprehensive metabolic panel (Cmet) - [facility]  Result Value Ref Range   Sodium 140 136 - 145 mEq/L   Potassium 3.7 3.5 - 5.1 mEq/L   Chloride 103 98 - 109 mEq/L   CO2 29 22 - 29 mEq/L   Glucose 102 70 - 140 mg/dl   BUN 18.0 7.0 - 26.0 mg/dL   Creatinine 0.8 0.6 - 1.1 mg/dL   Total Bilirubin 0.30 0.20 - 1.20 mg/dL   Alkaline Phosphatase 83 40 - 150 U/L   AST 18 5 - 34 U/L   ALT 15 0 - 55 U/L   Total Protein 6.6 6.4 - 8.3 g/dL   Albumin 3.5 3.5 - 5.0 g/dL   Calcium 9.5 8.4 - 10.4 mg/dL   Anion Gap 8 3 - 11 mEq/L   EGFR 86 (L) >90 ml/min/1.73 m2    CA 125 ordered for 04-11-13  Studies/Results:  No results found.  Medications: I have reviewed the patient's current medications. Resume prn meclizine. Add saline nasal spray every 20-30 min while awake for next several days  DISCUSSION: slight intermittent vertigo symptoms suggest inner ear, plan as above.   Assessment/Plan:  1. High grade serous left ovarian carcinoma: post  3 cycles neoadjuvant carboplatin taxol, optimal debulking, and now continuing chemotherapy. Expect repeat CTs after cycle 6, then reevaluation by Dr Denman George 2.vertigo as above. Negative CT head for similar symptoms 12-2013. Plan as above 3 obesity: she may be eligible for GOG study with diet and exercise if NED after chemo completes. Not discussed yet 4.post left total knee replacement for degenerative arthritis  5.Anemia: Hgb lower today, but not clearly symptomatic. Continue oral iron and follow. Last transfused PRBCs with surgery 6.PAC in 7.neuropathy symptoms stable in right foot since surgery 8. Allergic sinusitis: saline spray and washes   Chemo and gCSF orders confirmed. She will call if needed prior to next scheduled appointment.   LIVESAY,LENNIS P, MD   04/02/2014, 10:53 AM

## 2014-04-11 ENCOUNTER — Ambulatory Visit: Payer: Medicare Other

## 2014-04-11 ENCOUNTER — Other Ambulatory Visit (HOSPITAL_BASED_OUTPATIENT_CLINIC_OR_DEPARTMENT_OTHER): Payer: Medicare Other

## 2014-04-11 ENCOUNTER — Ambulatory Visit (HOSPITAL_BASED_OUTPATIENT_CLINIC_OR_DEPARTMENT_OTHER): Payer: Medicare Other

## 2014-04-11 DIAGNOSIS — C562 Malignant neoplasm of left ovary: Secondary | ICD-10-CM

## 2014-04-11 DIAGNOSIS — Z5111 Encounter for antineoplastic chemotherapy: Secondary | ICD-10-CM

## 2014-04-11 DIAGNOSIS — C569 Malignant neoplasm of unspecified ovary: Secondary | ICD-10-CM

## 2014-04-11 LAB — CBC WITH DIFFERENTIAL/PLATELET
BASO%: 0.1 % (ref 0.0–2.0)
BASOS ABS: 0 10*3/uL (ref 0.0–0.1)
EOS%: 0 % (ref 0.0–7.0)
Eosinophils Absolute: 0 10*3/uL (ref 0.0–0.5)
HEMATOCRIT: 27.6 % — AB (ref 34.8–46.6)
HGB: 8.8 g/dL — ABNORMAL LOW (ref 11.6–15.9)
LYMPH%: 12.7 % — ABNORMAL LOW (ref 14.0–49.7)
MCH: 28.9 pg (ref 25.1–34.0)
MCHC: 31.9 g/dL (ref 31.5–36.0)
MCV: 90.7 fL (ref 79.5–101.0)
MONO#: 0.1 10*3/uL (ref 0.1–0.9)
MONO%: 1.6 % (ref 0.0–14.0)
NEUT#: 5.5 10*3/uL (ref 1.5–6.5)
NEUT%: 85.6 % — AB (ref 38.4–76.8)
Platelets: 252 10*3/uL (ref 145–400)
RBC: 3.04 10*6/uL — ABNORMAL LOW (ref 3.70–5.45)
RDW: 19.6 % — AB (ref 11.2–14.5)
WBC: 6.5 10*3/uL (ref 3.9–10.3)
lymph#: 0.8 10*3/uL — ABNORMAL LOW (ref 0.9–3.3)

## 2014-04-11 LAB — COMPREHENSIVE METABOLIC PANEL (CC13)
ALK PHOS: 80 U/L (ref 40–150)
ALT: 12 U/L (ref 0–55)
ANION GAP: 8 meq/L (ref 3–11)
AST: 15 U/L (ref 5–34)
Albumin: 3.4 g/dL — ABNORMAL LOW (ref 3.5–5.0)
BUN: 22.3 mg/dL (ref 7.0–26.0)
CHLORIDE: 109 meq/L (ref 98–109)
CO2: 23 mEq/L (ref 22–29)
Calcium: 9.2 mg/dL (ref 8.4–10.4)
Creatinine: 0.9 mg/dL (ref 0.6–1.1)
EGFR: 77 mL/min/{1.73_m2} — ABNORMAL LOW (ref >90)
Glucose: 131 mg/dl (ref 70–140)
Potassium: 3.9 mEq/L (ref 3.5–5.1)
SODIUM: 140 meq/L (ref 136–145)
Total Bilirubin: 0.21 mg/dL (ref 0.20–1.20)
Total Protein: 7.1 g/dL (ref 6.4–8.3)

## 2014-04-11 MED ORDER — DEXTROSE 5 % IV SOLN
80.0000 mg/m2 | Freq: Once | INTRAVENOUS | Status: AC
  Administered 2014-04-11: 168 mg via INTRAVENOUS
  Filled 2014-04-11: qty 28

## 2014-04-11 MED ORDER — ONDANSETRON 16 MG/50ML IVPB (CHCC)
INTRAVENOUS | Status: AC
  Filled 2014-04-11: qty 16

## 2014-04-11 MED ORDER — CARBOPLATIN CHEMO INTRADERMAL TEST DOSE 100MCG/0.02ML
100.0000 ug | Freq: Once | INTRADERMAL | Status: AC
Start: 2014-04-11 — End: 2014-04-11
  Administered 2014-04-11: 100 ug via INTRADERMAL
  Filled 2014-04-11: qty 0.01

## 2014-04-11 MED ORDER — HEPARIN SOD (PORK) LOCK FLUSH 100 UNIT/ML IV SOLN
500.0000 [IU] | Freq: Once | INTRAVENOUS | Status: AC
  Administered 2014-04-11: 500 [IU] via INTRAVENOUS
  Filled 2014-04-11: qty 5

## 2014-04-11 MED ORDER — DIPHENHYDRAMINE HCL 50 MG/ML IJ SOLN
50.0000 mg | Freq: Once | INTRAMUSCULAR | Status: AC
  Administered 2014-04-11: 50 mg via INTRAVENOUS

## 2014-04-11 MED ORDER — DEXAMETHASONE SODIUM PHOSPHATE 20 MG/5ML IJ SOLN
12.0000 mg | Freq: Once | INTRAMUSCULAR | Status: AC
  Administered 2014-04-11: 12 mg via INTRAVENOUS

## 2014-04-11 MED ORDER — SODIUM CHLORIDE 0.9 % IV SOLN
580.0000 mg | Freq: Once | INTRAVENOUS | Status: AC
  Administered 2014-04-11: 580 mg via INTRAVENOUS
  Filled 2014-04-11: qty 58

## 2014-04-11 MED ORDER — SODIUM CHLORIDE 0.9 % IV SOLN
150.0000 mg | Freq: Once | INTRAVENOUS | Status: AC
  Administered 2014-04-11: 150 mg via INTRAVENOUS
  Filled 2014-04-11: qty 5

## 2014-04-11 MED ORDER — ONDANSETRON 16 MG/50ML IVPB (CHCC)
16.0000 mg | Freq: Once | INTRAVENOUS | Status: AC
  Administered 2014-04-11: 16 mg via INTRAVENOUS

## 2014-04-11 MED ORDER — SODIUM CHLORIDE 0.9 % IV SOLN
Freq: Once | INTRAVENOUS | Status: AC
  Administered 2014-04-11: 11:00:00 via INTRAVENOUS

## 2014-04-11 MED ORDER — DEXAMETHASONE SODIUM PHOSPHATE 20 MG/5ML IJ SOLN
INTRAMUSCULAR | Status: AC
  Filled 2014-04-11: qty 5

## 2014-04-11 MED ORDER — FAMOTIDINE IN NACL 20-0.9 MG/50ML-% IV SOLN
INTRAVENOUS | Status: AC
  Filled 2014-04-11: qty 50

## 2014-04-11 MED ORDER — HEPARIN SOD (PORK) LOCK FLUSH 100 UNIT/ML IV SOLN
500.0000 [IU] | Freq: Once | INTRAVENOUS | Status: AC | PRN
  Administered 2014-04-11: 500 [IU]
  Filled 2014-04-11: qty 5

## 2014-04-11 MED ORDER — DIPHENHYDRAMINE HCL 50 MG/ML IJ SOLN
INTRAMUSCULAR | Status: AC
  Filled 2014-04-11: qty 1

## 2014-04-11 MED ORDER — SODIUM CHLORIDE 0.9 % IJ SOLN
10.0000 mL | INTRAMUSCULAR | Status: DC | PRN
  Administered 2014-04-11: 10 mL
  Filled 2014-04-11: qty 10

## 2014-04-11 MED ORDER — FAMOTIDINE IN NACL 20-0.9 MG/50ML-% IV SOLN
20.0000 mg | Freq: Once | INTRAVENOUS | Status: AC
  Administered 2014-04-11: 20 mg via INTRAVENOUS

## 2014-04-11 MED ORDER — SODIUM CHLORIDE 0.9 % IJ SOLN
10.0000 mL | INTRAMUSCULAR | Status: DC | PRN
  Administered 2014-04-11: 10 mL via INTRAVENOUS
  Filled 2014-04-11: qty 10

## 2014-04-11 NOTE — Patient Instructions (Signed)
[facility] Discharge Instructions for Patients Receiving Chemotherapy  Today you received the following chemotherapy agents:  Taxol, carboplatin  To help prevent nausea and vomiting after your treatment, we encourage you to take your nausea medication.  Take it as often as prescribed.     If you develop nausea and vomiting that is not controlled by your nausea medication, call the clinic. If it is after clinic hours your family physician or the after hours number for the clinic or go to the Emergency Department.   BELOW ARE SYMPTOMS THAT SHOULD BE REPORTED IMMEDIATELY:  *FEVER GREATER THAN 100.5 F  *CHILLS WITH OR WITHOUT FEVER  NAUSEA AND VOMITING THAT IS NOT CONTROLLED WITH YOUR NAUSEA MEDICATION  *UNUSUAL SHORTNESS OF BREATH  *UNUSUAL BRUISING OR BLEEDING  TENDERNESS IN MOUTH AND THROAT WITH OR WITHOUT PRESENCE OF ULCERS  *URINARY PROBLEMS  *BOWEL PROBLEMS  UNUSUAL RASH Items with * indicate a potential emergency and should be followed up as soon as possible.  Feel free to call the clinic you have any questions or concerns. The clinic phone number is [phone].   I have been informed and understand all the instructions given to me. I know to contact the clinic, my physician, or go to the Emergency Department if any problems should occur. I do not have any questions at this time, but understand that I may call the clinic during office hours   should I have any questions or need assistance in obtaining follow up care.    __________________________________________  _____________  __________ Signature of Patient or Authorized Representative            Date                   Time    __________________________________________ Nurse's Signature

## 2014-04-11 NOTE — Progress Notes (Signed)
1106 - carbo test dose administered left posterior forearm

## 2014-04-12 ENCOUNTER — Ambulatory Visit: Payer: BC Managed Care – PPO

## 2014-04-12 ENCOUNTER — Ambulatory Visit (HOSPITAL_BASED_OUTPATIENT_CLINIC_OR_DEPARTMENT_OTHER): Payer: Medicare Other

## 2014-04-12 DIAGNOSIS — C569 Malignant neoplasm of unspecified ovary: Secondary | ICD-10-CM

## 2014-04-12 DIAGNOSIS — C562 Malignant neoplasm of left ovary: Secondary | ICD-10-CM

## 2014-04-12 DIAGNOSIS — Z5189 Encounter for other specified aftercare: Secondary | ICD-10-CM

## 2014-04-12 LAB — CA 125: CA 125: 10 U/mL (ref <35)

## 2014-04-12 MED ORDER — TBO-FILGRASTIM 300 MCG/0.5ML ~~LOC~~ SOSY
300.0000 ug | PREFILLED_SYRINGE | Freq: Once | SUBCUTANEOUS | Status: AC
  Administered 2014-04-12: 300 ug via SUBCUTANEOUS

## 2014-04-12 NOTE — Patient Instructions (Signed)
Filgrastim, G-CSF injection  What is this medicine?  FILGRASTIM, G-CSF (fil GRA stim) is a granulocyte colony-stimulating factor that stimulates the growth of neutrophils, a type of white blood cell (WBC) important in the body's fight against infection. It is used to reduce the incidence of fever and infection in patients with certain types of cancer who are receiving chemotherapy that affects the bone marrow, to stimulate blood cell production for removal of WBCs from the body prior to a bone marrow transplantation, to reduce the incidence of fever and infection in patients who have severe chronic neutropenia, and to improve survival outcomes following high-dose radiation exposure that is toxic to the bone marrow.  This medicine may be used for other purposes; ask your health care provider or pharmacist if you have questions.  COMMON BRAND NAME(S): Neupogen  What should I tell my health care provider before I take this medicine?  They need to know if you have any of these conditions:  -latex allergy  -ongoing radiation therapy  -sickle cell disease  -an unusual or allergic reaction to filgrastim, pegfilgrastim, other medicines, foods, dyes, or preservatives  -pregnant or trying to get pregnant  -breast-feeding  How should I use this medicine?  This medicine is for injection under the skin. If you get this medicine at home, you will be taught how to prepare and give this medicine. Refer to the Instructions for Use that come with your medication packaging. Use exactly as directed. Take your medicine at regular intervals. Do not take your medicine more often than directed.  It is important that you put your used needles and syringes in a special sharps container. Do not put them in a trash can. If you do not have a sharps container, call your pharmacist or healthcare provider to get one.  Talk to your pediatrician regarding the use of this medicine in children. While this drug may be prescribed for children as young  as 7 months for selected conditions, precautions do apply.  Overdosage: If you think you have taken too much of this medicine contact a poison control center or emergency room at once.  NOTE: This medicine is only for you. Do not share this medicine with others.  What if I miss a dose?  It is important not to miss your dose. Call your doctor or health care professional if you miss a dose.  What may interact with this medicine?  This medicine may interact with the following medications:  -medicines that may cause a release of neutrophils, such as lithium  This list may not describe all possible interactions. Give your health care provider a list of all the medicines, herbs, non-prescription drugs, or dietary supplements you use. Also tell them if you smoke, drink alcohol, or use illegal drugs. Some items may interact with your medicine.  What should I watch for while using this medicine?  You may need blood work done while you are taking this medicine.  What side effects may I notice from receiving this medicine?  Side effects that you should report to your doctor or health care professional as soon as possible:  -allergic reactions like skin rash, itching or hives, swelling of the face, lips, or tongue  -dizziness or feeling faint  -fever  -pain, redness, or irritation at site where injected  -pinpoint red spots on the skin  -shortness of breath or breathing problems  -stomach or side pain, or pain at the shoulder  -swelling  -tiredness  -trouble passing urine  -unusual   bleeding or bruising  Side effects that usually do not require medical attention (report to your doctor or health care professional if they continue or are bothersome):  -bone pain  -headache  -muscle pain  This list may not describe all possible side effects. Call your doctor for medical advice about side effects. You may report side effects to FDA at 1-800-FDA-1088.  Where should I keep my medicine?  Keep out of the reach of children.  Store in a  refrigerator between 2 and 8 degrees C (36 and 46 degrees F). Do not freeze. Keep in carton to protect from light. Throw away this medicine if vials or syringes are left out of the refrigerator for more than 24 hours. Throw away any unused medicine after the expiration date.  NOTE: This sheet is a summary. It may not cover all possible information. If you have questions about this medicine, talk to your doctor, pharmacist, or health care provider.   2015, Elsevier/Gold Standard. (2013-07-05 17:00:01)

## 2014-04-14 ENCOUNTER — Telehealth: Payer: Self-pay

## 2014-04-14 NOTE — Telephone Encounter (Signed)
Told Melissa Ball that her CA-125 was 10 on 04-11-14 as noted below by Dr. Marko Plume.  Melissa Ball verbalized understanding.

## 2014-04-14 NOTE — Telephone Encounter (Signed)
-----   Message from Gordy Levan, MD sent at 04/13/2014  6:01 PM EST ----- Labs seen and need follow up: please let her know ca125 is still in good low range at 10

## 2014-04-15 ENCOUNTER — Ambulatory Visit: Payer: BC Managed Care – PPO | Admitting: Internal Medicine

## 2014-04-18 ENCOUNTER — Other Ambulatory Visit: Payer: BC Managed Care – PPO

## 2014-04-18 ENCOUNTER — Ambulatory Visit: Payer: Medicare Other

## 2014-04-18 ENCOUNTER — Other Ambulatory Visit (HOSPITAL_BASED_OUTPATIENT_CLINIC_OR_DEPARTMENT_OTHER): Payer: Medicare Other

## 2014-04-18 ENCOUNTER — Ambulatory Visit (HOSPITAL_BASED_OUTPATIENT_CLINIC_OR_DEPARTMENT_OTHER): Payer: Medicare Other

## 2014-04-18 DIAGNOSIS — Z5111 Encounter for antineoplastic chemotherapy: Secondary | ICD-10-CM

## 2014-04-18 DIAGNOSIS — C562 Malignant neoplasm of left ovary: Secondary | ICD-10-CM

## 2014-04-18 DIAGNOSIS — Z95828 Presence of other vascular implants and grafts: Secondary | ICD-10-CM

## 2014-04-18 LAB — COMPREHENSIVE METABOLIC PANEL (CC13)
ALT: 14 U/L (ref 0–55)
AST: 20 U/L (ref 5–34)
Albumin: 3.8 g/dL (ref 3.5–5.0)
Alkaline Phosphatase: 87 U/L (ref 40–150)
Anion Gap: 12 mEq/L — ABNORMAL HIGH (ref 3–11)
BUN: 20.9 mg/dL (ref 7.0–26.0)
CO2: 23 meq/L (ref 22–29)
Calcium: 9.7 mg/dL (ref 8.4–10.4)
Chloride: 105 mEq/L (ref 98–109)
Creatinine: 0.9 mg/dL (ref 0.6–1.1)
EGFR: 82 mL/min/{1.73_m2} — ABNORMAL LOW (ref >90)
GLUCOSE: 123 mg/dL (ref 70–140)
Potassium: 4.1 mEq/L (ref 3.5–5.1)
Sodium: 139 mEq/L (ref 136–145)
Total Bilirubin: 0.21 mg/dL (ref 0.20–1.20)
Total Protein: 7.5 g/dL (ref 6.4–8.3)

## 2014-04-18 LAB — CBC WITH DIFFERENTIAL/PLATELET
BASO%: 0 % (ref 0.0–2.0)
Basophils Absolute: 0 10*3/uL (ref 0.0–0.1)
EOS%: 0 % (ref 0.0–7.0)
Eosinophils Absolute: 0 10*3/uL (ref 0.0–0.5)
HCT: 29 % — ABNORMAL LOW (ref 34.8–46.6)
HGB: 9.3 g/dL — ABNORMAL LOW (ref 11.6–15.9)
LYMPH#: 1 10*3/uL (ref 0.9–3.3)
LYMPH%: 21.5 % (ref 14.0–49.7)
MCH: 28.3 pg (ref 25.1–34.0)
MCHC: 32.1 g/dL (ref 31.5–36.0)
MCV: 88.1 fL (ref 79.5–101.0)
MONO#: 0.2 10*3/uL (ref 0.1–0.9)
MONO%: 3.1 % (ref 0.0–14.0)
NEUT#: 3.6 10*3/uL (ref 1.5–6.5)
NEUT%: 75.4 % (ref 38.4–76.8)
PLATELETS: 285 10*3/uL (ref 145–400)
RBC: 3.29 10*6/uL — AB (ref 3.70–5.45)
RDW: 18.3 % — ABNORMAL HIGH (ref 11.2–14.5)
WBC: 4.8 10*3/uL (ref 3.9–10.3)

## 2014-04-18 MED ORDER — SODIUM CHLORIDE 0.9 % IJ SOLN
10.0000 mL | INTRAMUSCULAR | Status: DC | PRN
  Administered 2014-04-18: 10 mL
  Filled 2014-04-18: qty 10

## 2014-04-18 MED ORDER — HEPARIN SOD (PORK) LOCK FLUSH 100 UNIT/ML IV SOLN
500.0000 [IU] | Freq: Once | INTRAVENOUS | Status: AC | PRN
  Administered 2014-04-18: 500 [IU]
  Filled 2014-04-18: qty 5

## 2014-04-18 MED ORDER — PACLITAXEL CHEMO INJECTION 300 MG/50ML
80.0000 mg/m2 | Freq: Once | INTRAVENOUS | Status: AC
  Administered 2014-04-18: 168 mg via INTRAVENOUS
  Filled 2014-04-18: qty 28

## 2014-04-18 MED ORDER — DIPHENHYDRAMINE HCL 50 MG/ML IJ SOLN
INTRAMUSCULAR | Status: AC
  Filled 2014-04-18: qty 1

## 2014-04-18 MED ORDER — SODIUM CHLORIDE 0.9 % IV SOLN
Freq: Once | INTRAVENOUS | Status: AC
  Administered 2014-04-18: 11:00:00 via INTRAVENOUS

## 2014-04-18 MED ORDER — DIPHENHYDRAMINE HCL 50 MG/ML IJ SOLN
50.0000 mg | Freq: Once | INTRAMUSCULAR | Status: AC
  Administered 2014-04-18: 50 mg via INTRAVENOUS

## 2014-04-18 MED ORDER — ONDANSETRON 8 MG/NS 50 ML IVPB
INTRAVENOUS | Status: AC
  Filled 2014-04-18: qty 8

## 2014-04-18 MED ORDER — DEXAMETHASONE SODIUM PHOSPHATE 20 MG/5ML IJ SOLN
INTRAMUSCULAR | Status: AC
  Filled 2014-04-18: qty 5

## 2014-04-18 MED ORDER — SODIUM CHLORIDE 0.9 % IJ SOLN
10.0000 mL | INTRAMUSCULAR | Status: DC | PRN
  Filled 2014-04-18: qty 10

## 2014-04-18 MED ORDER — DEXAMETHASONE SODIUM PHOSPHATE 20 MG/5ML IJ SOLN
20.0000 mg | Freq: Once | INTRAMUSCULAR | Status: AC
  Administered 2014-04-18: 20 mg via INTRAVENOUS

## 2014-04-18 MED ORDER — ONDANSETRON 8 MG/50ML IVPB (CHCC)
8.0000 mg | Freq: Once | INTRAVENOUS | Status: AC
  Administered 2014-04-18: 8 mg via INTRAVENOUS

## 2014-04-18 MED ORDER — FAMOTIDINE IN NACL 20-0.9 MG/50ML-% IV SOLN
20.0000 mg | Freq: Once | INTRAVENOUS | Status: AC
  Administered 2014-04-18: 20 mg via INTRAVENOUS

## 2014-04-18 MED ORDER — FAMOTIDINE IN NACL 20-0.9 MG/50ML-% IV SOLN
INTRAVENOUS | Status: AC
  Filled 2014-04-18: qty 50

## 2014-04-18 NOTE — Patient Instructions (Signed)
[facility] Discharge Instructions for Patients Receiving Chemotherapy  Today you received the following chemotherapy agent: taxol  To help prevent nausea and vomiting after your treatment, we encourage you to take your nausea medications as directed:  Zofran 8 mg every 8 hours as needed  Ativan 0.5 mg- 1 mg every 6 hours as needed  If you develop nausea and vomiting that is not controlled by your nausea medication, call the clinic.   BELOW ARE SYMPTOMS THAT SHOULD BE REPORTED IMMEDIATELY:  *FEVER GREATER THAN 100.5 F  *CHILLS WITH OR WITHOUT FEVER  NAUSEA AND VOMITING THAT IS NOT CONTROLLED WITH YOUR NAUSEA MEDICATION  *UNUSUAL SHORTNESS OF BREATH  *UNUSUAL BRUISING OR BLEEDING  TENDERNESS IN MOUTH AND THROAT WITH OR WITHOUT PRESENCE OF ULCERS  *URINARY PROBLEMS  *BOWEL PROBLEMS  UNUSUAL RASH Items with * indicate a potential emergency and should be followed up as soon as possible.  Feel free to call the clinic should you have any questions or concerns. The clinic phone number is [phone].  It has been a pleasure to serve you today!

## 2014-04-18 NOTE — Patient Instructions (Signed)

## 2014-04-19 ENCOUNTER — Ambulatory Visit: Payer: BC Managed Care – PPO

## 2014-04-19 ENCOUNTER — Ambulatory Visit (HOSPITAL_BASED_OUTPATIENT_CLINIC_OR_DEPARTMENT_OTHER): Payer: Medicare Other

## 2014-04-19 DIAGNOSIS — Z5189 Encounter for other specified aftercare: Secondary | ICD-10-CM

## 2014-04-19 DIAGNOSIS — C562 Malignant neoplasm of left ovary: Secondary | ICD-10-CM

## 2014-04-19 DIAGNOSIS — C569 Malignant neoplasm of unspecified ovary: Secondary | ICD-10-CM

## 2014-04-19 MED ORDER — TBO-FILGRASTIM 300 MCG/0.5ML ~~LOC~~ SOSY
300.0000 ug | PREFILLED_SYRINGE | Freq: Once | SUBCUTANEOUS | Status: AC
  Administered 2014-04-19: 300 ug via SUBCUTANEOUS

## 2014-04-19 NOTE — Patient Instructions (Signed)

## 2014-04-24 ENCOUNTER — Telehealth: Payer: Self-pay

## 2014-04-24 ENCOUNTER — Encounter: Payer: Self-pay | Admitting: Oncology

## 2014-04-24 ENCOUNTER — Ambulatory Visit (HOSPITAL_BASED_OUTPATIENT_CLINIC_OR_DEPARTMENT_OTHER): Payer: Medicare Other | Admitting: Oncology

## 2014-04-24 ENCOUNTER — Other Ambulatory Visit (HOSPITAL_BASED_OUTPATIENT_CLINIC_OR_DEPARTMENT_OTHER): Payer: Medicare Other

## 2014-04-24 ENCOUNTER — Other Ambulatory Visit: Payer: Self-pay | Admitting: Oncology

## 2014-04-24 ENCOUNTER — Ambulatory Visit (HOSPITAL_BASED_OUTPATIENT_CLINIC_OR_DEPARTMENT_OTHER): Payer: Medicare Other

## 2014-04-24 ENCOUNTER — Telehealth: Payer: Self-pay | Admitting: Oncology

## 2014-04-24 VITALS — BP 102/48 | HR 101 | Temp 98.6°F | Resp 18 | Ht 64.0 in | Wt 226.8 lb

## 2014-04-24 DIAGNOSIS — E669 Obesity, unspecified: Secondary | ICD-10-CM

## 2014-04-24 DIAGNOSIS — Z9889 Other specified postprocedural states: Secondary | ICD-10-CM

## 2014-04-24 DIAGNOSIS — T502X5A Adverse effect of carbonic-anhydrase inhibitors, benzothiadiazides and other diuretics, initial encounter: Secondary | ICD-10-CM

## 2014-04-24 DIAGNOSIS — D6481 Anemia due to antineoplastic chemotherapy: Secondary | ICD-10-CM

## 2014-04-24 DIAGNOSIS — Z95828 Presence of other vascular implants and grafts: Secondary | ICD-10-CM

## 2014-04-24 DIAGNOSIS — C562 Malignant neoplasm of left ovary: Secondary | ICD-10-CM

## 2014-04-24 DIAGNOSIS — E876 Hypokalemia: Secondary | ICD-10-CM

## 2014-04-24 DIAGNOSIS — T451X5A Adverse effect of antineoplastic and immunosuppressive drugs, initial encounter: Secondary | ICD-10-CM

## 2014-04-24 DIAGNOSIS — G620 Drug-induced polyneuropathy: Secondary | ICD-10-CM

## 2014-04-24 LAB — COMPREHENSIVE METABOLIC PANEL (CC13)
ALBUMIN: 3.7 g/dL (ref 3.5–5.0)
ALK PHOS: 87 U/L (ref 40–150)
ALT: 16 U/L (ref 0–55)
AST: 16 U/L (ref 5–34)
Anion Gap: 10 mEq/L (ref 3–11)
BUN: 15 mg/dL (ref 7.0–26.0)
CALCIUM: 9.1 mg/dL (ref 8.4–10.4)
CHLORIDE: 102 meq/L (ref 98–109)
CO2: 27 meq/L (ref 22–29)
CREATININE: 0.8 mg/dL (ref 0.6–1.1)
EGFR: 87 mL/min/{1.73_m2} — ABNORMAL LOW (ref >90)
Glucose: 97 mg/dl (ref 70–140)
Potassium: 3.5 mEq/L (ref 3.5–5.1)
Sodium: 140 mEq/L (ref 136–145)
TOTAL PROTEIN: 7 g/dL (ref 6.4–8.3)

## 2014-04-24 LAB — CBC WITH DIFFERENTIAL/PLATELET
BASO%: 0.2 % (ref 0.0–2.0)
Basophils Absolute: 0 10*3/uL (ref 0.0–0.1)
EOS ABS: 0 10*3/uL (ref 0.0–0.5)
EOS%: 0 % (ref 0.0–7.0)
HCT: 28.7 % — ABNORMAL LOW (ref 34.8–46.6)
HEMOGLOBIN: 9.2 g/dL — AB (ref 11.6–15.9)
LYMPH%: 47.6 % (ref 14.0–49.7)
MCH: 28.6 pg (ref 25.1–34.0)
MCHC: 32.1 g/dL (ref 31.5–36.0)
MCV: 89.1 fL (ref 79.5–101.0)
MONO#: 0.8 10*3/uL (ref 0.1–0.9)
MONO%: 12.5 % (ref 0.0–14.0)
NEUT#: 2.4 10*3/uL (ref 1.5–6.5)
NEUT%: 39.7 % (ref 38.4–76.8)
PLATELETS: 229 10*3/uL (ref 145–400)
RBC: 3.22 10*6/uL — ABNORMAL LOW (ref 3.70–5.45)
RDW: 19.1 % — ABNORMAL HIGH (ref 11.2–14.5)
WBC: 6.2 10*3/uL (ref 3.9–10.3)
lymph#: 2.9 10*3/uL (ref 0.9–3.3)

## 2014-04-24 MED ORDER — PANTOPRAZOLE SODIUM 40 MG PO TBEC: 40.0000 mg | DELAYED_RELEASE_TABLET | Freq: Every day | ORAL | Status: DC | PRN

## 2014-04-24 MED ORDER — POTASSIUM CHLORIDE CRYS ER 10 MEQ PO TBCR: 10.0000 meq | EXTENDED_RELEASE_TABLET | ORAL | Status: DC

## 2014-04-24 MED ORDER — SODIUM CHLORIDE 0.9 % IJ SOLN
10.0000 mL | INTRAMUSCULAR | Status: DC | PRN
  Administered 2014-04-24: 10 mL via INTRAVENOUS
  Filled 2014-04-24: qty 10

## 2014-04-24 NOTE — Telephone Encounter (Signed)
Told Ms. Tallent the results of the potassium from c-met from today as noted below by Dr. Marko Plume.  Sent prescription to pharmacy.  Ms. Marando verbalized understanding.

## 2014-04-24 NOTE — Progress Notes (Signed)
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 She is for day 15 cycle 6 dose dense carboplatin taxol on 04-25-14, then will have restaging scans in Feb 2016 prior to Dr Serita Grit visit 06-02-14. She may be interested in GOG weight loss and exercise study if scans good.  Patient continues to tolerate chemotherapy well overall. 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 She has had no significant nausea, one day of GI upset after eating chili beans, bowels otherwise ok. She is not more symptomatic from the anemia.She has no symptoms related to interval debulking surgery now.  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 14cm adnexal mass.  Significant radiographic and chemical response to neoadjuvant platinum and taxane neoadjuvant chemotherapy x 4 cycles.  Interval cytoreduction to microscopic disease on 01/21/14. Pathology was most consistent with ovarian primary.     Endometrial cancer determined by uterine biopsy (Resolved)   09/16/2013 Initial Diagnosis Endometrial cancer determined by uterine biopsy, and endocervical biopsy    Ovarian cancer   09/23/2013 Initial Diagnosis Ovarian cancer   10/03/2013 - 01/04/2014 Neo-Adjuvant Chemotherapy dose dense paclitaxel and carboplatin x 4 cycles   01/21/2014 Surgery robotic hysterectomy, BSO, omentectomy, interval cytoreduction to microscopic disease  Patient had been in usual good health until  acute episode of abdominal pain in Dec 2014, which eventually resolved without medical attention, then vague diffuse abdominal discomfort, low grade nausea and abdominal bloating. She was seen in June 2015 for first gyn exam in years, PAP with AGUS and follow up biopsies of endocervix and endometrium by Dr Pamala Hurry both with serous endometrial cancer (pathology from [facility] 09-12-13, case # [id number] to be scanned into this EMR). Biopsy of cervix had normal squamous epithelium. CA125 from [facility] 09-16-13 was 964, and was up to 1406 by 10-23-13.Marland Kitchen She was seen by Dr Denman George on 09-23-13, her exam remarkable for large pelvic mass minimally mobile and extending to umbilicus. She had CT CAP 09-26-13 had prominent but not enlarged juxtapericardiac lymph nodes, trace right pleural effusion, large amount of abnormal soft tissue thruout peritoneal cavity, predominately with omentum and especially LUQ, largest area 3.8 x 3.0 x 4.0 cm anterior to proximal descending colon, implants adjacent to liver, large complex multicystic lesion in pelvis inseperable from uterus and adnexae, with mass effect on bladder, questionable area in central aspect of dome of liver, borderline enlarged retroperitoneal nodes. Due to extent of disease on CT, Dr Denman George has recommended that treatment begin with chemotherapy, with consideration of interval debulking surgery depending on response to systemic treatment. Cycle one taxol carboplatin was given on 5-36-14, complicated by severe nausea/vomiting, constipation and severe taxol aches. Cycle 2 was changed to dose dense carbo taxol, day 1 on 11-01-13. She needed gCSF support by cycle 3. Neoadjuvant chemo was one cycle carbo taxol standard dose and 3 cycles dose dense. 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 She resumed chemo with cycle 4 on 02-21-14  and will complete cycle 6 on 04-25-14  Review of systems as above,  also: No fever or symptoms of infection. No bleeding. No LE swelling. Hair is growing back. No increased SOB. Bladder ok Remainder of 10 point Review of Systems negative.  Objective:  Vital signs in last 24 hours:  BP 102/48 mmHg  Pulse 101  Temp(Src) 98.6 F (37 C) (Oral)  Resp 18  Ht 5\' 4"  (1.626 m)  Wt 226 lb 12.8 oz (102.876 kg)  BMI 38.91 kg/m2  SpO2 100%  Alert, oriented and appropriate. Ambulatory without assistance.  Partial alopecia  HEENT:PERRL, sclerae not icteric. Oral mucosa moist without lesions, posterior pharynx clear.  Neck supple. No JVD.  Lymphatics:no cervical,supraclavicular, axillary or inguinal adenopathy Resp: clear to auscultation bilaterally and normal percussion bilaterally Cardio: regular rate and rhythm. No gallop. GI: abdomen soft, nontender, not distended, no mass or organomegaly. Normally active bowel sounds. Surgical incision not remarkable. Musculoskeletal/ Extremities: without pitting edema, cords, tenderness Neuro: slight peripheral neuropathy tips of fingers and right foot > left. Otherwise nonfocal. PSYCH appropriate mood and affect. Skin without rash, ecchymosis, petechiae. Mucous membranes and conjunctivae slightly pale, unchanged  Portacath-without erythema or tenderness  Lab Results:  Results for orders placed or performed in visit on 04/24/14  CBC with Differential  Result Value Ref Range   WBC 6.2 3.9 - 10.3 10e3/uL   NEUT# 2.4 1.5 - 6.5 10e3/uL   HGB 9.2 (L) 11.6 - 15.9 g/dL   HCT 28.7 (L) 34.8 - 46.6 %   Platelets 229 145 - 400 10e3/uL   MCV 89.1 79.5 - 101.0 fL   MCH 28.6 25.1 - 34.0 pg   MCHC 32.1 31.5 - 36.0 g/dL   RBC 3.22 (L) 3.70 - 5.45 10e6/uL   RDW 19.1 (H) 11.2 - 14.5 %   lymph# 2.9 0.9 - 3.3 10e3/uL   MONO# 0.8 0.1 - 0.9 10e3/uL   Eosinophils Absolute 0.0 0.0 - 0.5 10e3/uL   Basophils Absolute 0.0 0.0 - 0.1 10e3/uL   NEUT% 39.7 38.4 - 76.8 %   LYMPH% 47.6 14.0 - 49.7 %   MONO% 12.5 0.0 - 14.0 %   EOS% 0.0  0.0 - 7.0 %   BASO% 0.2 0.0 - 2.0 %    CMET available after visit entirely normal, including K 3.5, creat 0.8 and Tbili <0.2  Studies/Results:  No results found.  Medications: I have reviewed the patient's current medications.She has not taken potassium in 2-3 days, will suggest that she continue this just MWF for now as also off diuretic now.Refill Protonix and continue this daily. If weather does not allow her to come to office for gCSF on 1-23, will move that to early next week.  DISCUSSION: Medications, CBC,plans for restaging scans, PAC management and general recovery following completion of chemo discussed. She would be interested in GOG diet and exercise study if appropriate after restaging scans, and I will make referral if so. She is aware that she will be followed closely when treatment completes.  Assessment/Plan: 1. High grade serous left ovarian carcinoma: post 3 cycles neoadjuvant carboplatin taxol, optimal debulking, and now continuing chemotherapy. Repeat CTs after cycle 6, then reevaluation by Dr Denman George 2.clinical benign positional vertigo, resolved 3 obesity: she may be eligible for GOG study with diet and exercise if NED after chemo completes.  4.post left total knee replacement for degenerative arthritis  5.Anemia: Hgb ~ stable today, not more symptomatic. Continue oral iron and follow. Last transfused PRBCs with surgery 6.PAC in  7.neuropathy symptoms stable in right foot since surgery 8. Allergic sinusitis: saline spray and washes helpful, symptoms improved    I will see her back after Dr Serita Grit appointment, or sooner if needed. She has appointment with PCP Dr Jenny Reichmann in early Feb All questions answered and patient is in agreement with plans. Chemo and granix orders confirmed. TIme spent 66min including >50% counseling and coordination of care.   Council Munguia P, MD   04/24/2014, 8:57 AM

## 2014-04-24 NOTE — Telephone Encounter (Signed)
-----   Message from Gordy Levan, MD sent at 04/24/2014 11:13 AM EST ----- Labs seen and need follow up: please let her know K is low normal; she last took K 2-3 days ago.  I think ok for her to take the potassium every MWF only for now, instead of daily

## 2014-04-24 NOTE — Patient Instructions (Signed)

## 2014-04-24 NOTE — Telephone Encounter (Signed)
s.w. pt and advised on new appt....she advised me to give Clemsia her sched and she will get it from her....done.Marland KitchenMarland Kitchen

## 2014-04-25 ENCOUNTER — Other Ambulatory Visit: Payer: BC Managed Care – PPO

## 2014-04-25 ENCOUNTER — Ambulatory Visit (HOSPITAL_BASED_OUTPATIENT_CLINIC_OR_DEPARTMENT_OTHER): Payer: Medicare Other

## 2014-04-25 DIAGNOSIS — C562 Malignant neoplasm of left ovary: Secondary | ICD-10-CM

## 2014-04-25 DIAGNOSIS — Z5111 Encounter for antineoplastic chemotherapy: Secondary | ICD-10-CM

## 2014-04-25 MED ORDER — ONDANSETRON 8 MG/50ML IVPB (CHCC)
8.0000 mg | Freq: Once | INTRAVENOUS | Status: AC
  Administered 2014-04-25: 8 mg via INTRAVENOUS

## 2014-04-25 MED ORDER — PACLITAXEL CHEMO INJECTION 300 MG/50ML
80.0000 mg/m2 | Freq: Once | INTRAVENOUS | Status: AC
  Administered 2014-04-25: 168 mg via INTRAVENOUS
  Filled 2014-04-25: qty 28

## 2014-04-25 MED ORDER — DEXAMETHASONE SODIUM PHOSPHATE 20 MG/5ML IJ SOLN
20.0000 mg | Freq: Once | INTRAMUSCULAR | Status: AC
  Administered 2014-04-25: 20 mg via INTRAVENOUS

## 2014-04-25 MED ORDER — HEPARIN SOD (PORK) LOCK FLUSH 100 UNIT/ML IV SOLN
500.0000 [IU] | Freq: Once | INTRAVENOUS | Status: AC | PRN
  Administered 2014-04-25: 500 [IU]
  Filled 2014-04-25: qty 5

## 2014-04-25 MED ORDER — ONDANSETRON 8 MG/NS 50 ML IVPB
INTRAVENOUS | Status: AC
  Filled 2014-04-25: qty 8

## 2014-04-25 MED ORDER — SODIUM CHLORIDE 0.9 % IV SOLN
Freq: Once | INTRAVENOUS | Status: AC
  Administered 2014-04-25: 10:00:00 via INTRAVENOUS

## 2014-04-25 MED ORDER — FAMOTIDINE IN NACL 20-0.9 MG/50ML-% IV SOLN
20.0000 mg | Freq: Once | INTRAVENOUS | Status: AC
  Administered 2014-04-25: 20 mg via INTRAVENOUS

## 2014-04-25 MED ORDER — FAMOTIDINE IN NACL 20-0.9 MG/50ML-% IV SOLN
INTRAVENOUS | Status: AC
  Filled 2014-04-25: qty 50

## 2014-04-25 MED ORDER — DIPHENHYDRAMINE HCL 50 MG/ML IJ SOLN
INTRAMUSCULAR | Status: AC
  Filled 2014-04-25: qty 1

## 2014-04-25 MED ORDER — DEXAMETHASONE SODIUM PHOSPHATE 20 MG/5ML IJ SOLN
INTRAMUSCULAR | Status: AC
  Filled 2014-04-25: qty 5

## 2014-04-25 MED ORDER — SODIUM CHLORIDE 0.9 % IJ SOLN
10.0000 mL | INTRAMUSCULAR | Status: DC | PRN
  Administered 2014-04-25: 10 mL
  Filled 2014-04-25: qty 10

## 2014-04-25 MED ORDER — DIPHENHYDRAMINE HCL 50 MG/ML IJ SOLN
50.0000 mg | Freq: Once | INTRAMUSCULAR | Status: AC
  Administered 2014-04-25: 50 mg via INTRAVENOUS

## 2014-04-25 NOTE — Patient Instructions (Signed)
[facility] Discharge Instructions for Patients Receiving Chemotherapy  Today you received the following chemotherapy agents: Taxol.  To help prevent nausea and vomiting after your treatment, we encourage you to take your nausea medication as prescribed.   If you develop nausea and vomiting that is not controlled by your nausea medication, call the clinic.   BELOW ARE SYMPTOMS THAT SHOULD BE REPORTED IMMEDIATELY:  *FEVER GREATER THAN 100.5 F  *CHILLS WITH OR WITHOUT FEVER  NAUSEA AND VOMITING THAT IS NOT CONTROLLED WITH YOUR NAUSEA MEDICATION  *UNUSUAL SHORTNESS OF BREATH  *UNUSUAL BRUISING OR BLEEDING  TENDERNESS IN MOUTH AND THROAT WITH OR WITHOUT PRESENCE OF ULCERS  *URINARY PROBLEMS  *BOWEL PROBLEMS  UNUSUAL RASH Items with * indicate a potential emergency and should be followed up as soon as possible.  Feel free to call the clinic you have any questions or concerns. The clinic phone number is [phone].    

## 2014-04-26 ENCOUNTER — Other Ambulatory Visit: Payer: Self-pay | Admitting: *Deleted

## 2014-04-26 ENCOUNTER — Ambulatory Visit: Payer: Self-pay

## 2014-04-26 ENCOUNTER — Ambulatory Visit: Payer: BC Managed Care – PPO

## 2014-04-28 ENCOUNTER — Ambulatory Visit: Payer: Medicare Other

## 2014-04-28 ENCOUNTER — Ambulatory Visit (HOSPITAL_BASED_OUTPATIENT_CLINIC_OR_DEPARTMENT_OTHER): Payer: Medicare Other

## 2014-04-28 ENCOUNTER — Telehealth: Payer: Self-pay | Admitting: Oncology

## 2014-04-28 DIAGNOSIS — C562 Malignant neoplasm of left ovary: Secondary | ICD-10-CM

## 2014-04-28 DIAGNOSIS — C569 Malignant neoplasm of unspecified ovary: Secondary | ICD-10-CM

## 2014-04-28 DIAGNOSIS — Z5189 Encounter for other specified aftercare: Secondary | ICD-10-CM

## 2014-04-28 MED ORDER — TBO-FILGRASTIM 300 MCG/0.5ML ~~LOC~~ SOSY
300.0000 ug | PREFILLED_SYRINGE | Freq: Once | SUBCUTANEOUS | Status: AC
  Administered 2014-04-28: 300 ug via SUBCUTANEOUS
  Filled 2014-04-28: qty 0.5

## 2014-04-28 NOTE — Patient Instructions (Signed)

## 2014-04-28 NOTE — Telephone Encounter (Signed)
per pof to sch pt inj @ 11-sch per pof

## 2014-05-07 ENCOUNTER — Ambulatory Visit: Payer: BC Managed Care – PPO | Admitting: Internal Medicine

## 2014-05-16 ENCOUNTER — Telehealth: Payer: Self-pay

## 2014-05-16 ENCOUNTER — Other Ambulatory Visit (HOSPITAL_BASED_OUTPATIENT_CLINIC_OR_DEPARTMENT_OTHER): Payer: Medicare Other

## 2014-05-16 ENCOUNTER — Ambulatory Visit (HOSPITAL_COMMUNITY)
Admission: RE | Admit: 2014-05-16 | Discharge: 2014-05-16 | Disposition: A | Discharge status: Home / Self Care | Payer: Medicare Other | Source: Ambulatory Visit | Attending: Oncology | Admitting: Oncology

## 2014-05-16 ENCOUNTER — Ambulatory Visit (HOSPITAL_BASED_OUTPATIENT_CLINIC_OR_DEPARTMENT_OTHER): Payer: Medicare Other

## 2014-05-16 DIAGNOSIS — Z79899 Other long term (current) drug therapy: Secondary | ICD-10-CM | POA: Insufficient documentation

## 2014-05-16 DIAGNOSIS — C562 Malignant neoplasm of left ovary: Secondary | ICD-10-CM

## 2014-05-16 DIAGNOSIS — Z95828 Presence of other vascular implants and grafts: Secondary | ICD-10-CM

## 2014-05-16 DIAGNOSIS — Z452 Encounter for adjustment and management of vascular access device: Secondary | ICD-10-CM

## 2014-05-16 LAB — COMPREHENSIVE METABOLIC PANEL (CC13)
ALK PHOS: 74 U/L (ref 40–150)
ALT: 11 U/L (ref 0–55)
AST: 16 U/L (ref 5–34)
Albumin: 3.5 g/dL (ref 3.5–5.0)
Anion Gap: 11 mEq/L (ref 3–11)
BUN: 12.8 mg/dL (ref 7.0–26.0)
CHLORIDE: 108 meq/L (ref 98–109)
CO2: 24 mEq/L (ref 22–29)
CREATININE: 0.8 mg/dL (ref 0.6–1.1)
Calcium: 9.8 mg/dL (ref 8.4–10.4)
EGFR: 86 mL/min/{1.73_m2} — AB (ref >90)
Glucose: 92 mg/dl (ref 70–140)
Potassium: 3.6 mEq/L (ref 3.5–5.1)
Sodium: 142 mEq/L (ref 136–145)
Total Bilirubin: 0.28 mg/dL (ref 0.20–1.20)
Total Protein: 7 g/dL (ref 6.4–8.3)

## 2014-05-16 LAB — CBC WITH DIFFERENTIAL/PLATELET
BASO%: 0.3 % (ref 0.0–2.0)
Basophils Absolute: 0 10*3/uL (ref 0.0–0.1)
EOS%: 0.3 % (ref 0.0–7.0)
Eosinophils Absolute: 0 10*3/uL (ref 0.0–0.5)
HEMATOCRIT: 30.6 % — AB (ref 34.8–46.6)
HEMOGLOBIN: 9.5 g/dL — AB (ref 11.6–15.9)
LYMPH%: 35.3 % (ref 14.0–49.7)
MCH: 28.8 pg (ref 25.1–34.0)
MCHC: 31.2 g/dL — ABNORMAL LOW (ref 31.5–36.0)
MCV: 92.3 fL (ref 79.5–101.0)
MONO#: 0.5 10*3/uL (ref 0.1–0.9)
MONO%: 8.4 % (ref 0.0–14.0)
NEUT%: 55.7 % (ref 38.4–76.8)
NEUTROS ABS: 3.2 10*3/uL (ref 1.5–6.5)
PLATELETS: 222 10*3/uL (ref 145–400)
RBC: 3.32 10*6/uL — ABNORMAL LOW (ref 3.70–5.45)
RDW: 21.5 % — ABNORMAL HIGH (ref 11.2–14.5)
WBC: 5.7 10*3/uL (ref 3.9–10.3)
lymph#: 2 10*3/uL (ref 0.9–3.3)

## 2014-05-16 MED ORDER — SODIUM CHLORIDE 0.9 % IJ SOLN
10.0000 mL | INTRAMUSCULAR | Status: DC | PRN
  Administered 2014-05-16: 10 mL via INTRAVENOUS
  Filled 2014-05-16: qty 10

## 2014-05-16 MED ORDER — HEPARIN SOD (PORK) LOCK FLUSH 100 UNIT/ML IV SOLN
500.0000 [IU] | Freq: Once | INTRAVENOUS | Status: AC
  Administered 2014-05-16: 500 [IU] via INTRAVENOUS
  Filled 2014-05-16: qty 5

## 2014-05-16 MED ORDER — IOHEXOL 300 MG/ML  SOLN
100.0000 mL | Freq: Once | INTRAMUSCULAR | Status: AC | PRN
  Administered 2014-05-16: 100 mL via INTRAVENOUS

## 2014-05-16 NOTE — Telephone Encounter (Signed)
-----   Message from Gordy Levan, MD sent at 05/16/2014 10:50 AM EST ----- Labs seen and need follow up: can let her know chemistries good and the anemia is a little bit better, up to 9.5, other blood counts good

## 2014-05-16 NOTE — Telephone Encounter (Signed)
Told Melissa Ball the results of the labs from today as noted below by Dr. Marko Plume.  Melissa Ball verbalized understanding.

## 2014-05-17 LAB — CA 125: CA 125: 8 U/mL (ref <35)

## 2014-06-02 ENCOUNTER — Encounter: Payer: Self-pay | Admitting: Gynecologic Oncology

## 2014-06-02 ENCOUNTER — Ambulatory Visit: Payer: Medicare Other | Attending: Gynecologic Oncology | Admitting: Gynecologic Oncology

## 2014-06-02 VITALS — BP 126/82 | HR 88 | Temp 97.8°F | Resp 18 | Ht 64.0 in | Wt 236.9 lb

## 2014-06-02 DIAGNOSIS — Z90722 Acquired absence of ovaries, bilateral: Secondary | ICD-10-CM | POA: Insufficient documentation

## 2014-06-02 DIAGNOSIS — K219 Gastro-esophageal reflux disease without esophagitis: Secondary | ICD-10-CM | POA: Diagnosis not present

## 2014-06-02 DIAGNOSIS — Z96652 Presence of left artificial knee joint: Secondary | ICD-10-CM | POA: Diagnosis not present

## 2014-06-02 DIAGNOSIS — E785 Hyperlipidemia, unspecified: Secondary | ICD-10-CM | POA: Insufficient documentation

## 2014-06-02 DIAGNOSIS — Z8543 Personal history of malignant neoplasm of ovary: Secondary | ICD-10-CM | POA: Diagnosis not present

## 2014-06-02 DIAGNOSIS — F17200 Nicotine dependence, unspecified, uncomplicated: Secondary | ICD-10-CM | POA: Insufficient documentation

## 2014-06-02 DIAGNOSIS — C569 Malignant neoplasm of unspecified ovary: Secondary | ICD-10-CM

## 2014-06-02 DIAGNOSIS — Z9221 Personal history of antineoplastic chemotherapy: Secondary | ICD-10-CM | POA: Insufficient documentation

## 2014-06-02 DIAGNOSIS — I10 Essential (primary) hypertension: Secondary | ICD-10-CM | POA: Insufficient documentation

## 2014-06-02 DIAGNOSIS — Z90710 Acquired absence of both cervix and uterus: Secondary | ICD-10-CM | POA: Diagnosis not present

## 2014-06-02 DIAGNOSIS — Z08 Encounter for follow-up examination after completed treatment for malignant neoplasm: Secondary | ICD-10-CM | POA: Insufficient documentation

## 2014-06-02 NOTE — Patient Instructions (Signed)
Plan to follow up with Dr. Denman George in May and with Dr. Marko Plume as scheduled.  Please call for any new symptoms, questions, or concerns.

## 2014-06-02 NOTE — Progress Notes (Signed)
Ovarian Cancer Post-chemotherapy Followup  HPI:  Melissa Ball is a 64 y.o. year old G1 initially seen in consultation on [place] for clinical stage IV ovarian cancer.  She then underwent neoadjuvant chemotherapy with platinum and taxane therapy x 4 cycles with Dr Marko Plume between July 2nd and October 14th. She had a significant clinical response to therapy and on 01/21/14 she underwent an interval robotic debulking including hysterectomy, BSO, omentectomy to microscopic disease without complications.  Her postoperative course was complicated by some anemia postoperatively for which she received a blood transfusion with an appropriate response.  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   She received an additional 3 cycles of carboplatin and paclitaxel chemotherapy postoperatively bleeding therapy in January 2016.  CA-125 was normalized at 10 after completing chemotherapy. CT scan of the abdomen and pelvis performed on 05/16/2014 (after completing chemotherapy) revealed no gross residual disease. They describe interval decrease in nodularity within the omentum, interval decrease in size of the small periaortic node, and interval resection of the cystic and solid pelvic masses. There were no new lesions identified.   Current Outpatient Prescriptions on File Prior to Visit  Medication Sig Dispense Refill  . acetaminophen (TYLENOL) 500 MG tablet Take 500-1,000 mg by mouth as needed for mild pain.     . Bismuth Subsalicylate (PEPTO-BISMOL PO) Take 5 mLs by mouth 3 (three) times daily as needed.    . lidocaine-prilocaine (EMLA) cream Apply 1 application topically See admin instructions. Apply to Porta-Cath 1-2 hours prior to access as directed.    Marland Kitchen atorvastatin (LIPITOR) 10 MG tablet Take 10 mg by mouth daily.    Marland Kitchen dexamethasone (DECADRON) 4 MG tablet Take 5 tablets by mouth with food 12 hours prior  to chemotherapy. (Patient not taking: Reported on 04/24/2014) 15 tablet 2  . FERROCITE 324 MG TABS tablet Take 1 tablet by mouth daily. Take with OJ  5  . LORazepam (ATIVAN) 1 MG tablet Take 0.5-1 mg by mouth every 6 (six) hours as needed for anxiety.    . meclizine (ANTIVERT) 25 MG tablet Take 25 mg by mouth every 6 (six) hours as needed for dizziness.     . ondansetron (ZOFRAN) 8 MG tablet Take 8 mg by mouth every 8 (eight) hours as needed for nausea or vomiting.    . potassium chloride (KLOR-CON M10) 10 MEQ tablet Take 1 tablet (10 mEq total) by mouth every Monday, Wednesday, and Friday. (Patient not taking: Reported on 06/02/2014) 12 tablet 1   No current facility-administered medications on file prior to visit.    No Known Allergies  Past Medical History  Diagnosis Date  . Hypertension   . Hyperlipidemia   . Arthritis   . GERD (gastroesophageal reflux disease)   . History of transfusion   . Ovarian cancer     Past Surgical History  Procedure Laterality Date  . Joint replacement  2012    L KNEE  . Portacath placement    . Robotic assisted total hysterectomy with bilateral salpingo oopherectomy N/A 01/21/2014    Procedure: ROBOTIC ASSISTED TOTAL HYSTERECTOMY WITH BILATERAL SALPINGO OOPHORECTOMY with omentectomy;  Surgeon: Everitt Amber, MD;  Location: [facility];  Service: Gynecology;  Laterality: N/A;    Family History  Problem Relation Age of Onset  . Hypertension Mother   . Cancer Mother 57    colon cancer  . Hypertension Father   . Heart disease Father   .  Cancer Sister 95    Breast Cancer  . Alcohol abuse Brother   . Cancer Brother 60    Throat Cancer,lung cancer, smoker    History   Social History  . Marital Status: Divorced    Spouse Name: N/A  . Number of Children: N/A  . Years of Education: N/A   Occupational History  . Housekeeper [employer]   Social History Main Topics  . Smoking status: Current Some Day Smoker  . Smokeless tobacco: Not on file  . Alcohol  Use: 0.0 oz/week    0 Glasses of wine per week     Comment: RARE  . Drug Use: No  . Sexual Activity: No   Other Topics Concern  . Not on file   Social History Narrative   Regular exercise-yes   Caffeine use-no              Review of systems: Constitutional:  She has no weight gain or weight loss. She has no fever or chills. Eyes: No blurred vision Ears, Nose, Mouth, Throat: No dizziness, headaches or changes in hearing. No mouth sores. Cardiovascular: No chest pain, palpitations or edema. Respiratory:  No shortness of breath, wheezing or cough Gastrointestinal: She has normal bowel movements without diarrhea or constipation. She denies any nausea or vomiting. She denies blood in her stool or heart burn. Genitourinary:  She denies pelvic pain, pelvic pressure or changes in her urinary function. She has no hematuria, dysuria, or incontinence. She has no irregular vaginal bleeding or vaginal discharge Musculoskeletal: Denies muscle weakness or joint pains.  Skin:  She has no skin changes, rashes or itching Neurological:  Denies dizziness or headaches. No neuropathy, no numbness or tingling. Psychiatric:  She denies depression or anxiety. Hematologic/Lymphatic:   No easy bruising or bleeding   Physical Exam: Blood pressure 126/82, pulse 88, temperature 97.8 F (36.6 C), temperature source Oral, resp. rate 18, height 5\' 4"  (1.626 m), weight 236 lb 14.4 oz (107.457 kg). General: Well dressed, well nourished in no apparent distress.   HEENT:  Normocephalic and atraumatic, no lesions.  Extraocular muscles intact. Sclerae anicteric. Pupils equal, round, reactive. No mouth sores or ulcers. Thyroid is normal size, not nodular, midline. Skin:  No lesions or rashes. Breasts:  Soft, symmetric.  No skin or nipple changes.  No palpable LN or masses. Lungs:  Clear to auscultation bilaterally.  No wheezes. Cardiovascular:  Regular rate and rhythm.  No murmurs or rubs. Abdomen:  Soft,  nontender, nondistended.  No palpable masses.  No hepatosplenomegaly.  No ascites. Normal bowel sounds.  No hernias. Incision well healed. Genitourinary: Normal vaginal cuff with no visible or palpable vaginal or pelvic masses. Rectal exam confirmatory.  Extremities: No cyanosis, clubbing or edema.  No calf tenderness or erythema. No palpable cords. Psychiatric: Mood and affect are appropriate. Neurological: Awake, alert and oriented x 3. Sensation is intact, no neuropathy.  Musculoskeletal: No pain, normal strength and range of motion.  Assessment:    64 y.o. year old with stage IV ovarian high grade serous carcinoma.   S/p neoadjuvant chemotherapy, robotic hysterectomy, BSO and omentectomy on 01/21/14 and 3 cycles of adjuvant chemotherapy with platinum and taxane (dose dense). No evidence of disease/complete clinical response to therapy.  Plan: 1) Lab and imaging reports reviewed today 2) Treatment counseling - I discussed that Nakiya has had a complete response to therapy. She is entering surveillance phase now with an I recommend this included 3 monthly evaluations by Dr. Marko Plume and myself. We  will monitor CA 125 at these checks. I discussed that she has a high chance for recurrence of disease given her stage IV ovarian cancer. I discussed that in the case of recurrence cure would no longer be possible however treatment can be considered at that time for palliative intent.  3)  Return to clinic 3 months.  Donaciano Eva, MD

## 2014-06-26 ENCOUNTER — Encounter: Payer: Self-pay | Admitting: Oncology

## 2014-06-26 ENCOUNTER — Other Ambulatory Visit (HOSPITAL_BASED_OUTPATIENT_CLINIC_OR_DEPARTMENT_OTHER): Payer: Medicare Other

## 2014-06-26 ENCOUNTER — Ambulatory Visit (HOSPITAL_BASED_OUTPATIENT_CLINIC_OR_DEPARTMENT_OTHER): Payer: Medicare Other

## 2014-06-26 ENCOUNTER — Ambulatory Visit (HOSPITAL_BASED_OUTPATIENT_CLINIC_OR_DEPARTMENT_OTHER): Payer: Medicare Other | Admitting: Oncology

## 2014-06-26 ENCOUNTER — Telehealth: Payer: Self-pay | Admitting: Hematology and Oncology

## 2014-06-26 ENCOUNTER — Other Ambulatory Visit: Payer: Self-pay | Admitting: Oncology

## 2014-06-26 ENCOUNTER — Encounter: Payer: Self-pay | Admitting: *Deleted

## 2014-06-26 VITALS — BP 106/55 | HR 85 | Temp 97.4°F | Resp 18 | Ht 64.0 in | Wt 239.1 lb

## 2014-06-26 DIAGNOSIS — D6481 Anemia due to antineoplastic chemotherapy: Secondary | ICD-10-CM

## 2014-06-26 DIAGNOSIS — C562 Malignant neoplasm of left ovary: Secondary | ICD-10-CM | POA: Diagnosis not present

## 2014-06-26 DIAGNOSIS — G620 Drug-induced polyneuropathy: Secondary | ICD-10-CM | POA: Diagnosis not present

## 2014-06-26 DIAGNOSIS — Z95828 Presence of other vascular implants and grafts: Secondary | ICD-10-CM

## 2014-06-26 DIAGNOSIS — T451X5A Adverse effect of antineoplastic and immunosuppressive drugs, initial encounter: Secondary | ICD-10-CM

## 2014-06-26 DIAGNOSIS — C569 Malignant neoplasm of unspecified ovary: Secondary | ICD-10-CM

## 2014-06-26 LAB — COMPREHENSIVE METABOLIC PANEL (CC13)
ALK PHOS: 74 U/L (ref 40–150)
ALT: 14 U/L (ref 0–55)
AST: 17 U/L (ref 5–34)
Albumin: 3.6 g/dL (ref 3.5–5.0)
Anion Gap: 11 mEq/L (ref 3–11)
BUN: 20.1 mg/dL (ref 7.0–26.0)
CALCIUM: 9.4 mg/dL (ref 8.4–10.4)
CO2: 24 meq/L (ref 22–29)
CREATININE: 0.8 mg/dL (ref 0.6–1.1)
Chloride: 106 mEq/L (ref 98–109)
EGFR: 88 mL/min/{1.73_m2} — AB (ref >90)
Glucose: 94 mg/dl (ref 70–140)
Potassium: 3.9 mEq/L (ref 3.5–5.1)
Sodium: 140 mEq/L (ref 136–145)
Total Bilirubin: 0.21 mg/dL (ref 0.20–1.20)
Total Protein: 7 g/dL (ref 6.4–8.3)

## 2014-06-26 LAB — CBC WITH DIFFERENTIAL/PLATELET
BASO%: 0.3 % (ref 0.0–2.0)
Basophils Absolute: 0 10*3/uL (ref 0.0–0.1)
EOS%: 0.8 % (ref 0.0–7.0)
Eosinophils Absolute: 0.1 10*3/uL (ref 0.0–0.5)
HCT: 34.2 % — ABNORMAL LOW (ref 34.8–46.6)
HGB: 10.7 g/dL — ABNORMAL LOW (ref 11.6–15.9)
LYMPH%: 36.4 % (ref 14.0–49.7)
MCH: 28.3 pg (ref 25.1–34.0)
MCHC: 31.3 g/dL — AB (ref 31.5–36.0)
MCV: 90.4 fL (ref 79.5–101.0)
MONO#: 0.7 10*3/uL (ref 0.1–0.9)
MONO%: 9.8 % (ref 0.0–14.0)
NEUT#: 3.6 10*3/uL (ref 1.5–6.5)
NEUT%: 52.7 % (ref 38.4–76.8)
PLATELETS: 203 10*3/uL (ref 145–400)
RBC: 3.78 10*6/uL (ref 3.70–5.45)
RDW: 15.7 % — ABNORMAL HIGH (ref 11.2–14.5)
WBC: 6.8 10*3/uL (ref 3.9–10.3)
lymph#: 2.5 10*3/uL (ref 0.9–3.3)

## 2014-06-26 MED ORDER — HEPARIN SOD (PORK) LOCK FLUSH 100 UNIT/ML IV SOLN
500.0000 [IU] | Freq: Once | INTRAVENOUS | Status: AC
  Administered 2014-06-26: 500 [IU] via INTRAVENOUS
  Filled 2014-06-26: qty 5

## 2014-06-26 MED ORDER — SODIUM CHLORIDE 0.9 % IJ SOLN
10.0000 mL | INTRAMUSCULAR | Status: DC | PRN
  Administered 2014-06-26: 10 mL via INTRAVENOUS
  Filled 2014-06-26: qty 10

## 2014-06-26 NOTE — Patient Instructions (Signed)

## 2014-06-26 NOTE — Telephone Encounter (Signed)
per pof ot sch pt appt-gave pt copy of sch °

## 2014-06-26 NOTE — Progress Notes (Signed)
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 CA 125 was 1406 in 10-2013. Restaging CT AP 05-16-14 showed no apparent disease. She saw Dr Denman George last 06-02-14 and will see her again in May. (Visit today not in sequence as it was coordinated with PAC flush).  Patient is feeling gradually better since completion of chemotherapy, with improved appetite and energy. 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 Bowels are moving regularly, no abdominal or pelvic pain, no respiratory symptoms, no bleeding, no LE swelling   PAC in Has not had genetics testing Likely eligible for GOG 0225. Research RN gave written information about this study today and will follow up by phone. Patient is interested in weight loss, which would be beneficial, and I have encouraged her to consider this study.  ONCOLOGIC HISTORY Patient had acute abdominal pain Dec 2014, which resolved without medical attention, then vague diffuse abdominal discomfort, low grade nausea and abdominal bloating. She was seen in June 2015 for first gyn exam in years, PAP with AGUS and follow up biopsies of endocervix and endometrium by Dr Pamala Hurry both with serous apparent endometrial cancer (pathology from [facility] 09-12-13, case # [id number] to be scanned into this EMR). Biopsy of cervix had normal squamous epithelium. CA125 from [facility] 09-16-13 was 964, and was up to 1406 by 10-23-13.Marland Kitchen She was seen by Dr Denman George on 09-23-13, her exam remarkable for large pelvic mass minimally mobile and extending to umbilicus. She had CT CAP 09-26-13 had prominent but not enlarged juxtapericardiac lymph nodes, trace  right pleural effusion, large amount of abnormal soft tissue thruout peritoneal cavity, predominately with omentum and especially LUQ, largest area 3.8 x 3.0 x 4.0 cm anterior to proximal descending colon, implants adjacent to liver, large complex multicystic lesion in pelvis inseperable from uterus and adnexae, with mass effect on bladder, questionable area in central aspect of dome of liver, borderline enlarged retroperitoneal nodes. Due to extent of disease, treatment began with chemotherapy. Cycle one taxol carboplatin was given on 8-83-25, complicated by severe nausea/vomiting, constipation and severe taxol aches. Cycle 2 was changed to dose dense carbo taxol, day 1 on 11-01-13. She needed gCSF support by cycle 3. Neoadjuvant chemo was one cycle carbo taxol standard dose and 3 cycles dose dense. 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 She resumed chemo with cycle 4 on 02-21-14 and completed cycle 6 on 04-25-14  Review of systems as above, also: No fever or symptoms of infection. Bladder ok. No nausea or vomiting. No problems with PAC. Right shoulder discomfort on elevation,  no new or different pain otherwise. Remainder of 10 point Review of Systems negative.  Objective:  Vital signs in last 24 hours:  BP 106/55 mmHg  Pulse 85  Temp(Src) 97.4 F (36.3 C) (Oral)  Resp 18  Ht 5\' 4"  (1.626 m)  Wt 239 lb 1.6 oz (108.455 kg)  BMI 41.02 kg/m2  SpO2 100% Weight up 12 lbs from Jan.  Alert, oriented and appropriate. Ambulatory with some difficulty due to knee. Hair is growing back.  HEENT:PERRL, sclerae not icteric. Oral mucosa moist without lesions, posterior pharynx clear.  Neck supple. No JVD.  Lymphatics:no cervical,supraclavicular, axillary or inguinal adenopathy Resp: clear to auscultation bilaterally and normal percussion bilaterally Cardio: regular rate and  rhythm. No gallop. GI: abdomen obese, soft, nontender, not distended, no mass or organomegaly. Normally active bowel sounds. Surgical incision not remarkable. Musculoskeletal/ Extremities: without pitting edema, cords, tenderness.  Neuro:  peripheral neuropathy right foot as noted. Otherwise nonfocal. PSYCH appropriate mood and affect Skin without rash, ecchymosis, petechiae Portacath-without erythema or tenderness  Lab Results:  Results for orders placed or performed in visit on 06/26/14  CBC with Differential  Result Value Ref Range   WBC 6.8 3.9 - 10.3 10e3/uL   NEUT# 3.6 1.5 - 6.5 10e3/uL   HGB 10.7 (L) 11.6 - 15.9 g/dL   HCT 34.2 (L) 34.8 - 46.6 %   Platelets 203 145 - 400 10e3/uL   MCV 90.4 79.5 - 101.0 fL   MCH 28.3 25.1 - 34.0 pg   MCHC 31.3 (L) 31.5 - 36.0 g/dL   RBC 3.78 3.70 - 5.45 10e6/uL   RDW 15.7 (H) 11.2 - 14.5 %   lymph# 2.5 0.9 - 3.3 10e3/uL   MONO# 0.7 0.1 - 0.9 10e3/uL   Eosinophils Absolute 0.1 0.0 - 0.5 10e3/uL   Basophils Absolute 0.0 0.0 - 0.1 10e3/uL   NEUT% 52.7 38.4 - 76.8 %   LYMPH% 36.4 14.0 - 49.7 %   MONO% 9.8 0.0 - 14.0 %   EOS% 0.8 0.0 - 7.0 %   BASO% 0.3 0.0 - 2.0 %    CMET entirely normal, including K 3.9 (off potassium supplement) and creat 0.8  Studies/Results:  No results found.  Medications: I have reviewed the patient's current medications.  DISCUSSION: GOG 0225 as above, Research to follow up. WIll try to alternate visits ~ every 3 months with gyn oncology, tho may adjust slightly to coordinate with PAC flushes.  She is in agreement with keeping PAC in for now.   Assessment/Plan:  1. High grade serous left ovarian carcinoma: stage IV. Post treatment as above and now on observation. CA 125 marker to be repeated with next labs, and follow. Will alternate visits with gyn onc. 2.clinical benign positional vertigo, resolved 3 obesity: BMI 41. Hopefully will be interested in the GOG diet and exercise study 4.post left total knee  replacement for degenerative arthritis. RIght shoulder discomfort seems musculoskeletal 5.Anemia: Hgb improved but needs to continue iron 6.PAC in, needs to be flushed every 6-8 weeks when not otherwise used. 7.neuropathy symptoms stable in right foot since surgery 8. Environmental allergies: symptomatic rx prn   All questions answered and patient knows that she can call at any time if concerns between scheduled visits. Time spent 25 min including >50% counseling and coordination of care.  LIVESAY,LENNIS P, MD   06/26/2014, 8:39 AM

## 2014-06-27 ENCOUNTER — Other Ambulatory Visit: Payer: Self-pay | Admitting: Oncology

## 2014-06-27 ENCOUNTER — Telehealth: Payer: Self-pay | Admitting: Oncology

## 2014-06-27 DIAGNOSIS — D6481 Anemia due to antineoplastic chemotherapy: Secondary | ICD-10-CM | POA: Insufficient documentation

## 2014-06-27 DIAGNOSIS — C562 Malignant neoplasm of left ovary: Secondary | ICD-10-CM

## 2014-06-27 DIAGNOSIS — Z95828 Presence of other vascular implants and grafts: Secondary | ICD-10-CM | POA: Insufficient documentation

## 2014-06-27 DIAGNOSIS — G620 Drug-induced polyneuropathy: Secondary | ICD-10-CM | POA: Insufficient documentation

## 2014-06-27 DIAGNOSIS — T451X5A Adverse effect of antineoplastic and immunosuppressive drugs, initial encounter: Secondary | ICD-10-CM

## 2014-06-27 NOTE — Telephone Encounter (Signed)
Called and spoke with patient and she is aware of her appointments for lab and flush

## 2014-08-11 ENCOUNTER — Other Ambulatory Visit (HOSPITAL_BASED_OUTPATIENT_CLINIC_OR_DEPARTMENT_OTHER): Payer: Medicare Other

## 2014-08-11 ENCOUNTER — Ambulatory Visit: Payer: Medicare Other | Attending: Gynecologic Oncology | Admitting: Gynecologic Oncology

## 2014-08-11 ENCOUNTER — Ambulatory Visit (HOSPITAL_BASED_OUTPATIENT_CLINIC_OR_DEPARTMENT_OTHER): Payer: Medicare Other

## 2014-08-11 ENCOUNTER — Encounter: Payer: Self-pay | Admitting: Gynecologic Oncology

## 2014-08-11 VITALS — BP 142/82 | HR 82 | Temp 97.5°F | Resp 18 | Ht 64.0 in | Wt 238.8 lb

## 2014-08-11 DIAGNOSIS — Z803 Family history of malignant neoplasm of breast: Secondary | ICD-10-CM

## 2014-08-11 DIAGNOSIS — Z9221 Personal history of antineoplastic chemotherapy: Secondary | ICD-10-CM

## 2014-08-11 DIAGNOSIS — Z90710 Acquired absence of both cervix and uterus: Secondary | ICD-10-CM

## 2014-08-11 DIAGNOSIS — C569 Malignant neoplasm of unspecified ovary: Secondary | ICD-10-CM

## 2014-08-11 DIAGNOSIS — Z802 Family history of malignant neoplasm of other respiratory and intrathoracic organs: Secondary | ICD-10-CM

## 2014-08-11 DIAGNOSIS — Z8543 Personal history of malignant neoplasm of ovary: Secondary | ICD-10-CM | POA: Diagnosis not present

## 2014-08-11 DIAGNOSIS — Z800 Family history of malignant neoplasm of digestive organs: Secondary | ICD-10-CM

## 2014-08-11 DIAGNOSIS — Z95828 Presence of other vascular implants and grafts: Secondary | ICD-10-CM

## 2014-08-11 LAB — COMPREHENSIVE METABOLIC PANEL (CC13)
ALBUMIN: 3.6 g/dL (ref 3.5–5.0)
ALT: 18 U/L (ref 0–55)
ANION GAP: 9 meq/L (ref 3–11)
AST: 18 U/L (ref 5–34)
Alkaline Phosphatase: 88 U/L (ref 40–150)
BUN: 15.9 mg/dL (ref 7.0–26.0)
CALCIUM: 9.1 mg/dL (ref 8.4–10.4)
CO2: 22 meq/L (ref 22–29)
Chloride: 108 mEq/L (ref 98–109)
Creatinine: 0.8 mg/dL (ref 0.6–1.1)
Glucose: 89 mg/dl (ref 70–140)
POTASSIUM: 3.8 meq/L (ref 3.5–5.1)
SODIUM: 140 meq/L (ref 136–145)
Total Bilirubin: 0.26 mg/dL (ref 0.20–1.20)
Total Protein: 7 g/dL (ref 6.4–8.3)

## 2014-08-11 LAB — CBC WITH DIFFERENTIAL/PLATELET
BASO%: 0.1 % (ref 0.0–2.0)
BASOS ABS: 0 10*3/uL (ref 0.0–0.1)
EOS%: 1.3 % (ref 0.0–7.0)
Eosinophils Absolute: 0.1 10*3/uL (ref 0.0–0.5)
HCT: 36.4 % (ref 34.8–46.6)
HGB: 11.7 g/dL (ref 11.6–15.9)
LYMPH#: 2.5 10*3/uL (ref 0.9–3.3)
LYMPH%: 35.9 % (ref 14.0–49.7)
MCH: 27.9 pg (ref 25.1–34.0)
MCHC: 32.1 g/dL (ref 31.5–36.0)
MCV: 86.7 fL (ref 79.5–101.0)
MONO#: 0.6 10*3/uL (ref 0.1–0.9)
MONO%: 9.1 % (ref 0.0–14.0)
NEUT#: 3.7 10*3/uL (ref 1.5–6.5)
NEUT%: 53.6 % (ref 38.4–76.8)
Platelets: 200 10*3/uL (ref 145–400)
RBC: 4.2 10*6/uL (ref 3.70–5.45)
RDW: 14.5 % (ref 11.2–14.5)
WBC: 6.9 10*3/uL (ref 3.9–10.3)

## 2014-08-11 MED ORDER — SODIUM CHLORIDE 0.9 % IJ SOLN
10.0000 mL | INTRAMUSCULAR | Status: DC | PRN
Start: 2014-08-11 — End: 2014-08-11
  Administered 2014-08-11: 10 mL via INTRAVENOUS
  Filled 2014-08-11: qty 10

## 2014-08-11 MED ORDER — HEPARIN SOD (PORK) LOCK FLUSH 100 UNIT/ML IV SOLN
500.0000 [IU] | Freq: Once | INTRAVENOUS | Status: AC
Start: 2014-08-11 — End: 2014-08-11
  Administered 2014-08-11: 500 [IU] via INTRAVENOUS
  Filled 2014-08-11: qty 5

## 2014-08-11 NOTE — Patient Instructions (Addendum)
Make an appointment with your primary care doctor, Dr. Jenny Reichmann, for a wellness check in regards to your blood pressure and cholesterol.  We will make a referral for you to see the genetics counselor at the [facility].  Plan to follow up with Dr. Denman George in November or sooner if needed.

## 2014-08-11 NOTE — Patient Instructions (Signed)

## 2014-08-11 NOTE — Progress Notes (Signed)
Ovarian Cancer Followup  HPI:  Melissa Ball is a 64 y.o. year old G1 initially seen in consultation on [place] for clinical stage IV ovarian cancer.  She then underwent neoadjuvant chemotherapy with platinum and taxane therapy x 4 cycles with Dr Marko Plume between July 2nd and October 14th. She had a significant clinical response to therapy and on 01/21/14 she underwent an interval robotic debulking including hysterectomy, BSO, omentectomy to microscopic disease without complications.  Her postoperative course was complicated by some anemia postoperatively for which she received a blood transfusion with an appropriate response.  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   She received an additional 3 cycles of carboplatin and paclitaxel chemotherapy postoperatively bleeding therapy in January 2016.  CA-125 was normalized at 10 after completing chemotherapy. CT scan of the abdomen and pelvis performed on 05/16/2014 (after completing chemotherapy) revealed no gross residual disease. They describe interval decrease in nodularity within the omentum, interval decrease in size of the small periaortic node, and interval resection of the cystic and solid pelvic masses. There were no new lesions identified.  CA 125 om February 2016 was normal at 8.  Interval Hx: the patient is eating well, has no early satiety, has occasional gas pains that are relieved with ginger ale. Her weight is stable. Her only persistent toxicities from therapy are some mild neuropathy in bilateral feet.   Current Outpatient Prescriptions on File Prior to Visit  Medication Sig Dispense Refill  . acetaminophen (TYLENOL) 500 MG tablet Take 500-1,000 mg by mouth as needed for mild pain.     Marland Kitchen lidocaine-prilocaine (EMLA) cream Apply 1 application topically See admin instructions. Apply to Porta-Cath 1-2 hours prior to access as  directed.    Marland Kitchen atorvastatin (LIPITOR) 10 MG tablet Take 10 mg by mouth daily.    . Bismuth Subsalicylate (PEPTO-BISMOL PO) Take 5 mLs by mouth 3 (three) times daily as needed.    Marland Kitchen LORazepam (ATIVAN) 1 MG tablet Take 0.5-1 mg by mouth every 6 (six) hours as needed for anxiety.    . meclizine (ANTIVERT) 25 MG tablet Take 25 mg by mouth every 6 (six) hours as needed for dizziness.     . ondansetron (ZOFRAN) 8 MG tablet Take 8 mg by mouth every 8 (eight) hours as needed for nausea or vomiting.     No current facility-administered medications on file prior to visit.    No Known Allergies  Past Medical History  Diagnosis Date  . Hypertension   . Hyperlipidemia   . Arthritis   . GERD (gastroesophageal reflux disease)   . History of transfusion   . Ovarian cancer     Past Surgical History  Procedure Laterality Date  . Joint replacement  2012    L KNEE  . Portacath placement    . Robotic assisted total hysterectomy with bilateral salpingo oopherectomy N/A 01/21/2014    Procedure: ROBOTIC ASSISTED TOTAL HYSTERECTOMY WITH BILATERAL SALPINGO OOPHORECTOMY with omentectomy;  Surgeon: Everitt Amber, MD;  Location: [facility];  Service: Gynecology;  Laterality: N/A;    Family History  Problem Relation Age of Onset  . Hypertension Mother   . Cancer Mother 25    colon cancer  . Hypertension Father   . Heart disease Father   . Cancer Sister 56    Breast Cancer  . Alcohol abuse Brother   . Cancer Brother 60    Throat Cancer,lung cancer, smoker  History   Social History  . Marital Status: Divorced    Spouse Name: N/A  . Number of Children: N/A  . Years of Education: N/A   Occupational History  . Housekeeper [employer]   Social History Main Topics  . Smoking status: Current Some Day Smoker  . Smokeless tobacco: Not on file  . Alcohol Use: 0.0 oz/week    0 Glasses of wine per week     Comment: RARE  . Drug Use: No  . Sexual Activity: No   Other Topics Concern  . Not on file    Social History Narrative   Regular exercise-yes   Caffeine use-no              Review of systems: Constitutional:  She has no weight gain or weight loss. She has no fever or chills. Eyes: No blurred vision Ears, Nose, Mouth, Throat: No dizziness, headaches or changes in hearing. No mouth sores. Cardiovascular: No chest pain, palpitations or edema. Respiratory:  No shortness of breath, wheezing or cough Gastrointestinal: She has normal bowel movements without diarrhea or constipation. She denies any nausea or vomiting. She denies blood in her stool or heart burn. Genitourinary:  She denies pelvic pain, pelvic pressure or changes in her urinary function. She has no hematuria, dysuria, or incontinence. She has no irregular vaginal bleeding or vaginal discharge Musculoskeletal: Denies muscle weakness or joint pains.  Skin:  She has no skin changes, rashes or itching Neurological:  Denies dizziness or headaches. No neuropathy, no numbness or tingling. Psychiatric:  She denies depression or anxiety. Hematologic/Lymphatic:   No easy bruising or bleeding   Physical Exam: Blood pressure 142/82, pulse 82, temperature 97.5 F (36.4 C), temperature source Oral, resp. rate 18, height 5' 4"  (1.626 m), weight 238 lb 12.8 oz (108.319 kg). General: Well dressed, well nourished in no apparent distress.   HEENT:  Normocephalic and atraumatic, no lesions.  Extraocular muscles intact. Sclerae anicteric. Pupils equal, round, reactive. No mouth sores or ulcers. Thyroid is normal size, not nodular, midline. Skin:  No lesions or rashes. Breasts:  Soft, symmetric.  No skin or nipple changes.  No palpable LN or masses. Lungs:  Clear to auscultation bilaterally.  No wheezes. Cardiovascular:  Regular rate and rhythm.  No murmurs or rubs. Abdomen:  Soft, nontender, nondistended.  No palpable masses.  No hepatosplenomegaly.  No ascites. Normal bowel sounds.  No hernias. Incisions well healed. Genitourinary:  Normal vaginal cuff with no visible or palpable vaginal or pelvic masses. Rectal exam confirmatory.  Extremities: No cyanosis, clubbing or edema.  No calf tenderness or erythema. No palpable cords. Psychiatric: Mood and affect are appropriate. Neurological: Awake, alert and oriented x 3. Sensation is intact, no neuropathy.  Musculoskeletal: No pain, normal strength and range of motion.  Assessment:    64 y.o. year old with stage IV ovarian high grade serous carcinoma.   S/p neoadjuvant chemotherapy, robotic hysterectomy, BSO and omentectomy on 01/21/14 and 3 cycles of adjuvant chemotherapy with platinum and taxane (dose dense). No evidence of disease/complete clinical response to therapy.  Plan: 1) Genetics- referral to genetic counselor for BRCA testing given ovarian cancer diagnosis. 2) Treatment counseling - I discussed that Braniya continues to have a complete response to therapy. I recommend this included 3 monthly evaluations by Dr. Marko Plume and myself. We will monitor CA 125 at these checks. I discussed that she has a high chance for recurrence of disease given her stage IV ovarian cancer. I discussed that in the  case of recurrence cure would no longer be possible however treatment can be considered at that time for palliative intent.  3)  Return to see Dr Marko Plume in August and me in November.  Donaciano Eva, MD

## 2014-08-12 ENCOUNTER — Other Ambulatory Visit: Payer: Self-pay | Admitting: Oncology

## 2014-08-12 ENCOUNTER — Telehealth: Payer: Self-pay | Admitting: Oncology

## 2014-08-12 ENCOUNTER — Telehealth: Payer: Self-pay | Admitting: Genetic Counselor

## 2014-08-12 DIAGNOSIS — C569 Malignant neoplasm of unspecified ovary: Secondary | ICD-10-CM

## 2014-08-12 LAB — CA 125: CA 125: 26 U/mL (ref <35)

## 2014-08-12 NOTE — Telephone Encounter (Signed)
Called and left a message with her new lab appointment

## 2014-08-12 NOTE — Telephone Encounter (Signed)
Called pt and scheduled new pt gen counseling appt.  Melissa Ball 08/18/14 10 am  Dx: Ovarian Cancer Referring: Dr. Denman George

## 2014-08-13 ENCOUNTER — Encounter: Payer: Self-pay | Admitting: Internal Medicine

## 2014-08-13 ENCOUNTER — Ambulatory Visit (INDEPENDENT_AMBULATORY_CARE_PROVIDER_SITE_OTHER): Payer: Medicare Other | Admitting: Internal Medicine

## 2014-08-13 ENCOUNTER — Telehealth: Payer: Self-pay | Admitting: *Deleted

## 2014-08-13 VITALS — BP 134/88 | HR 88 | Temp 97.6°F | Wt 242.1 lb

## 2014-08-13 DIAGNOSIS — E785 Hyperlipidemia, unspecified: Secondary | ICD-10-CM

## 2014-08-13 DIAGNOSIS — I10 Essential (primary) hypertension: Secondary | ICD-10-CM

## 2014-08-13 DIAGNOSIS — Z0000 Encounter for general adult medical examination without abnormal findings: Secondary | ICD-10-CM | POA: Diagnosis not present

## 2014-08-13 MED ORDER — HYDROCHLOROTHIAZIDE 12.5 MG PO TABS: 12.5000 mg | ORAL_TABLET | Freq: Every day | ORAL | Status: DC

## 2014-08-13 NOTE — Assessment & Plan Note (Signed)
Not currently on statin, for lower chol diet, f/u lipids Lab Results  Component Value Date   CHOL 205* 08/14/2013   HDL 34.10* 08/14/2013   LDLCALC 140* 08/14/2013   LDLDIRECT 146.8 08/20/2012   TRIG 155.0* 08/14/2013   CHOLHDL 6 08/14/2013

## 2014-08-13 NOTE — Telephone Encounter (Signed)
Melissa Ball returned call and was given CA 125 results as noted below by Dr. Marko Plume. Melissa Ball agreeable to come back 09/08/14 at 10:30am to have lab rechecked. Told Melissa Ball that if she develops any symptoms prior to this appt such as abdominal bloating or pain to please call our office - Melissa Ball agreeable to this.

## 2014-08-13 NOTE — Assessment & Plan Note (Signed)
Ok to start hct 12.5 qd,  to f/u any worsening symptoms or concerns BP Readings from Last 3 Encounters:  08/13/14 134/88  08/11/14 142/82  06/26/14 106/55

## 2014-08-13 NOTE — Patient Instructions (Signed)
Please take all new medication as prescribed - the mild fluid pill  Please continue all other medications as before, and refills have been done if requested.  Please have the pharmacy call with any other refills you may need.  Please continue your efforts at being more active, low cholesterol diet, and weight control.  You are otherwise up to date with prevention measures today.  Please keep your appointments with your specialists as you may have planned  Please go to the LAB in the Basement (turn left off the elevator) for the tests to be done today  You will be contacted by phone if any changes need to be made immediately.  Otherwise, you will receive a letter about your results with an explanation, but please check with MyChart first.  Please remember to sign up for MyChart if you have not done so, as this will be important to you in the future with finding out test results, communicating by private email, and scheduling acute appointments online when needed.  Please return in 6 months, or sooner if needed

## 2014-08-13 NOTE — Progress Notes (Signed)
Subjective:    Patient ID: Melissa Ball, female    DOB: October 14, 1950, 64 y.o.   MRN: [id number]  HPI  Here for wellness and f/u;  Overall doing ok;  Pt denies Chest pain, worsening SOB, DOE, wheezing, orthopnea, PND, worsening LE edema, palpitations, dizziness or syncope.  Pt denies neurological change such as new headache, facial or extremity weakness.  Pt denies polydipsia, polyuria, or low sugar symptoms. Pt states overall good compliance with treatment and medications, good tolerability, and has been trying to follow appropriate diet.  Pt denies worsening depressive symptoms, suicidal ideation or panic. No fever, night sweats, wt loss, loss of appetite, or other constitutional symptoms.  Pt states good ability with ADL's, has low fall risk, home safety reviewed and adequate, no other significant changes in hearing or vision, and only occasionally active with exercise.  Had several recent mild elev BP after being off med x 3 mo. Past Medical History  Diagnosis Date  . Hypertension   . Hyperlipidemia   . Arthritis   . GERD (gastroesophageal reflux disease)   . History of transfusion   . Ovarian cancer    Past Surgical History  Procedure Laterality Date  . Joint replacement  2012    L KNEE  . Portacath placement    . Robotic assisted total hysterectomy with bilateral salpingo oopherectomy N/A 01/21/2014    Procedure: ROBOTIC ASSISTED TOTAL HYSTERECTOMY WITH BILATERAL SALPINGO OOPHORECTOMY with omentectomy;  Surgeon: Everitt Amber, MD;  Location: [facility];  Service: Gynecology;  Laterality: N/A;    reports that she has been smoking.  She does not have any smokeless tobacco history on file. She reports that she drinks alcohol. She reports that she does not use illicit drugs. family history includes Alcohol abuse in her brother; Cancer (age of onset: 67) in her brother; Cancer (age of onset: 73) in her sister; Cancer (age of onset: 24) in her mother; Heart disease in her father; Hypertension in  her father and mother. No Known Allergies Current Outpatient Prescriptions on File Prior to Visit  Medication Sig Dispense Refill  . acetaminophen (TYLENOL) 500 MG tablet Take 500-1,000 mg by mouth as needed for mild pain.     . Multiple Vitamin (MULTIVITAMIN) capsule Take 1 capsule by mouth daily.    Marland Kitchen atorvastatin (LIPITOR) 10 MG tablet Take 10 mg by mouth daily.    . Bismuth Subsalicylate (PEPTO-BISMOL PO) Take 5 mLs by mouth 3 (three) times daily as needed.    . lidocaine-prilocaine (EMLA) cream Apply 1 application topically See admin instructions. Apply to Porta-Cath 1-2 hours prior to access as directed.    Marland Kitchen LORazepam (ATIVAN) 1 MG tablet Take 0.5-1 mg by mouth every 6 (six) hours as needed for anxiety.    . meclizine (ANTIVERT) 25 MG tablet Take 25 mg by mouth every 6 (six) hours as needed for dizziness.     . ondansetron (ZOFRAN) 8 MG tablet Take 8 mg by mouth every 8 (eight) hours as needed for nausea or vomiting.     No current facility-administered medications on file prior to visit.   Declines pneumovax. Review of Systems Constitutional: Negative for increased diaphoresis, other activity, appetite or siginficant weight change other than noted HENT: Negative for worsening hearing loss, ear pain, facial swelling, mouth sores and neck stiffness.   Eyes: Negative for other worsening pain, redness or visual disturbance.  Respiratory: Negative for shortness of breath and wheezing  Cardiovascular: Negative for chest pain and palpitations.  Gastrointestinal: Negative for  diarrhea, blood in stool, abdominal distention or other pain Genitourinary: Negative for hematuria, flank pain or change in urine volume.  Musculoskeletal: Negative for myalgias or other joint complaints.  Skin: Negative for color change and wound or drainage.  Neurological: Negative for syncope and numbness. other than noted Hematological: Negative for adenopathy. or other swelling Psychiatric/Behavioral: Negative  for hallucinations, SI, self-injury, decreased concentration or other worsening agitation.      Objective:   Physical Exam BP 134/88 mmHg  Pulse 88  Temp(Src) 97.6 F (36.4 C) (Oral)  Wt 242 lb 1.9 oz (109.825 kg)  SpO2 98% Wt Readings from Last 3 Encounters:  08/13/14 242 lb 1.9 oz (109.825 kg)  08/11/14 238 lb 12.8 oz (108.319 kg)  06/26/14 239 lb 1.6 oz (108.455 kg)  VS noted,  Constitutional: Pt is oriented to person, place, and time. Appears well-developed and well-nourished, in no significant distress Head: Normocephalic and atraumatic.  Right Ear: External ear normal.  Left Ear: External ear normal.  Nose: Nose normal.  Mouth/Throat: Oropharynx is clear and moist.  Eyes: Conjunctivae and EOM are normal. Pupils are equal, round, and reactive to light.  Neck: Normal range of motion. Neck supple. No JVD present. No tracheal deviation present or significant neck LA or mass Cardiovascular: Normal rate, regular rhythm, normal heart sounds and intact distal pulses.   Pulmonary/Chest: Effort normal and breath sounds without rales or wheezing  Abdominal: Soft. Bowel sounds are normal. NT. No HSM  Musculoskeletal: Normal range of motion. Exhibits no edema.  Lymphadenopathy:  Has no cervical adenopathy.  Neurological: Pt is alert and oriented to person, place, and time. Pt has normal reflexes. No cranial nerve deficit. Motor grossly intact Skin: Skin is warm and dry. No rash noted.  Psychiatric:  Has normal mood and affect. Behavior is normal.      Assessment & Plan:

## 2014-08-13 NOTE — Assessment & Plan Note (Signed)

## 2014-08-13 NOTE — Telephone Encounter (Signed)
Left VM requesting return call from patient.

## 2014-08-13 NOTE — Telephone Encounter (Signed)
-----   Message from Gordy Levan, MD sent at 08/12/2014  8:18 AM EDT ----- Labs seen and need follow up: please let her know CA 125 marker is still in normal range but is some higher, now 26 compared with 8 in February. I would like to recheck this in one month. Need to be sure we let patient know result then.  Orders in

## 2014-08-13 NOTE — Progress Notes (Signed)
Pre visit review using our clinic review tool, if applicable. No additional management support is needed unless otherwise documented below in the visit note. 

## 2014-08-18 ENCOUNTER — Encounter: Payer: Medicare Other | Admitting: Genetic Counselor

## 2014-08-18 ENCOUNTER — Other Ambulatory Visit: Payer: Medicare Other

## 2014-09-08 ENCOUNTER — Ambulatory Visit (HOSPITAL_BASED_OUTPATIENT_CLINIC_OR_DEPARTMENT_OTHER): Payer: Medicare Other

## 2014-09-08 DIAGNOSIS — C569 Malignant neoplasm of unspecified ovary: Secondary | ICD-10-CM

## 2014-09-09 ENCOUNTER — Telehealth: Payer: Self-pay

## 2014-09-09 DIAGNOSIS — C569 Malignant neoplasm of unspecified ovary: Secondary | ICD-10-CM

## 2014-09-09 LAB — COMPREHENSIVE METABOLIC PANEL
ALBUMIN: 3.8 g/dL (ref 3.5–5.2)
ALK PHOS: 89 U/L (ref 39–117)
ALT: 11 U/L (ref 0–35)
AST: 15 U/L (ref 0–37)
BILIRUBIN TOTAL: 0.2 mg/dL (ref 0.2–1.2)
BUN: 19 mg/dL (ref 6–23)
CO2: 24 meq/L (ref 19–32)
CREATININE: 0.85 mg/dL (ref 0.50–1.10)
Calcium: 9.8 mg/dL (ref 8.4–10.5)
Chloride: 104 mEq/L (ref 96–112)
Glucose, Bld: 94 mg/dL (ref 70–99)
Potassium: 3.8 mEq/L (ref 3.5–5.3)
SODIUM: 141 meq/L (ref 135–145)
TOTAL PROTEIN: 7 g/dL (ref 6.0–8.3)

## 2014-09-09 LAB — CA 125: CA 125: 63 U/mL — ABNORMAL HIGH (ref <35)

## 2014-09-09 NOTE — Telephone Encounter (Signed)
-----   Message from Gordy Levan, MD sent at 09/09/2014  8:31 AM EDT ----- Labs seen and need follow up: please let patient know that the marker is higher than in May, which is probably some activity of the cancer again. I would like to look at CT AP and will see her to discuss after that. Please set up CT AP with contrast in next week and visit to me 6-27 at 3:30 + CBC CMET.  I have not put in POF to let RN call her before schedulers.  NOTE TO RN: if I open another clinic day earlier, should move her there  thanks

## 2014-09-09 NOTE — Telephone Encounter (Signed)
Told Melissa Ball the results of the CA-125 as noted below by Dr. Marko Plume. Gave appointment for f/u on 09-29-14 at 3pm for lab and then visit.  Told her that the schedulers will set up CT scan and call her with an appointment as noted below by Dr. Marko Plume. Melissa Ball verbalized understanding. Added c-met to labs drawn 09-08-14 for kidney function for up coming CT Scan. POF sent to scheduling.

## 2014-09-16 ENCOUNTER — Ambulatory Visit (HOSPITAL_COMMUNITY)
Admission: RE | Admit: 2014-09-16 | Discharge: 2014-09-16 | Disposition: A | Discharge status: Home / Self Care | Payer: Medicare Other | Source: Ambulatory Visit | Attending: Oncology | Admitting: Oncology

## 2014-09-16 ENCOUNTER — Encounter (HOSPITAL_COMMUNITY): Payer: Self-pay

## 2014-09-16 ENCOUNTER — Telehealth: Payer: Self-pay

## 2014-09-16 DIAGNOSIS — Z90722 Acquired absence of ovaries, bilateral: Secondary | ICD-10-CM | POA: Insufficient documentation

## 2014-09-16 DIAGNOSIS — E785 Hyperlipidemia, unspecified: Secondary | ICD-10-CM | POA: Diagnosis not present

## 2014-09-16 DIAGNOSIS — R1030 Lower abdominal pain, unspecified: Secondary | ICD-10-CM | POA: Diagnosis not present

## 2014-09-16 DIAGNOSIS — I700 Atherosclerosis of aorta: Secondary | ICD-10-CM | POA: Diagnosis not present

## 2014-09-16 DIAGNOSIS — K449 Diaphragmatic hernia without obstruction or gangrene: Secondary | ICD-10-CM | POA: Insufficient documentation

## 2014-09-16 DIAGNOSIS — Z9221 Personal history of antineoplastic chemotherapy: Secondary | ICD-10-CM | POA: Diagnosis not present

## 2014-09-16 DIAGNOSIS — I10 Essential (primary) hypertension: Secondary | ICD-10-CM | POA: Diagnosis not present

## 2014-09-16 DIAGNOSIS — Z90710 Acquired absence of both cervix and uterus: Secondary | ICD-10-CM | POA: Insufficient documentation

## 2014-09-16 DIAGNOSIS — C569 Malignant neoplasm of unspecified ovary: Secondary | ICD-10-CM | POA: Diagnosis not present

## 2014-09-16 DIAGNOSIS — C786 Secondary malignant neoplasm of retroperitoneum and peritoneum: Secondary | ICD-10-CM | POA: Diagnosis not present

## 2014-09-16 DIAGNOSIS — E279 Disorder of adrenal gland, unspecified: Secondary | ICD-10-CM | POA: Diagnosis not present

## 2014-09-16 DIAGNOSIS — K219 Gastro-esophageal reflux disease without esophagitis: Secondary | ICD-10-CM | POA: Diagnosis not present

## 2014-09-16 MED ORDER — IOHEXOL 300 MG/ML  SOLN
100.0000 mL | Freq: Once | INTRAMUSCULAR | Status: AC | PRN
  Administered 2014-09-16: 100 mL via INTRAVENOUS

## 2014-09-16 NOTE — Telephone Encounter (Signed)
Haywood Filler R - 08/11/14 >','<< Less Detail',event)" href="javascript:;">More Detail >>   Gordy Levan, MD   Sent: Tue September 16, 2014 1:19 PM    To: Baruch Merl, RN        Message     Thanks for remembering this. Please let Ms Britain know that the CT shows some very small areas where there is cancer progression and that I will talk with her about starting back on some treatment. If she prefers to come on 6-17, please have Webb Silversmith put her in one of the 30 min openings between the other 3 patients. If Ms Bienaime prefers to keep 6-27, that is ok.        I will try not to put anyone else onto 6-17 other than her ...        ----- Message -----     From: Baruch Merl, RN     Sent: 09/16/2014  1:11 PM      To: Lennis Marion Downer, MD        Lennis,    In a lab result note you stated that if you opened a clinic day earlier then 09-29-14 that Ms. Boyson should be moved to that spot.    Do you want her scheduled for 09-19-14?    Barbaraann Share

## 2014-09-16 NOTE — Telephone Encounter (Signed)
Told Ms. Heier the information noted below regarding the progression seen on her scan.  Offered earlier appointment on 09-19-14. Ms. Schewe is fine with keeping her appointment as scheduled on 09-29-14.

## 2014-09-17 NOTE — Telephone Encounter (Signed)
Melissa Ball called back today to see if she could have her appointment in the am on 09-29-14.  Told her that there was no earlier appointment available. Offered her 1030 on 09-25-14.  She rescheduled to 09-25-14 at 1000 for lab and 1030 visit.

## 2014-09-22 ENCOUNTER — Other Ambulatory Visit: Payer: Self-pay | Admitting: Oncology

## 2014-09-25 ENCOUNTER — Ambulatory Visit (HOSPITAL_BASED_OUTPATIENT_CLINIC_OR_DEPARTMENT_OTHER): Payer: Medicare Other

## 2014-09-25 ENCOUNTER — Telehealth: Payer: Self-pay | Admitting: Oncology

## 2014-09-25 ENCOUNTER — Encounter: Payer: Self-pay | Admitting: Oncology

## 2014-09-25 ENCOUNTER — Other Ambulatory Visit (HOSPITAL_BASED_OUTPATIENT_CLINIC_OR_DEPARTMENT_OTHER): Payer: Medicare Other

## 2014-09-25 ENCOUNTER — Other Ambulatory Visit: Payer: Self-pay | Admitting: Nurse Practitioner

## 2014-09-25 ENCOUNTER — Ambulatory Visit (HOSPITAL_BASED_OUTPATIENT_CLINIC_OR_DEPARTMENT_OTHER): Payer: Medicare Other | Admitting: Oncology

## 2014-09-25 VITALS — BP 137/83 | HR 88 | Temp 97.6°F | Resp 18 | Ht 64.0 in | Wt 249.3 lb

## 2014-09-25 DIAGNOSIS — C569 Malignant neoplasm of unspecified ovary: Secondary | ICD-10-CM

## 2014-09-25 DIAGNOSIS — Z95828 Presence of other vascular implants and grafts: Secondary | ICD-10-CM

## 2014-09-25 DIAGNOSIS — Z79899 Other long term (current) drug therapy: Secondary | ICD-10-CM

## 2014-09-25 DIAGNOSIS — C562 Malignant neoplasm of left ovary: Secondary | ICD-10-CM

## 2014-09-25 DIAGNOSIS — D6481 Anemia due to antineoplastic chemotherapy: Secondary | ICD-10-CM | POA: Diagnosis not present

## 2014-09-25 DIAGNOSIS — T451X5A Adverse effect of antineoplastic and immunosuppressive drugs, initial encounter: Secondary | ICD-10-CM

## 2014-09-25 DIAGNOSIS — G620 Drug-induced polyneuropathy: Secondary | ICD-10-CM

## 2014-09-25 LAB — COMPREHENSIVE METABOLIC PANEL (CC13)
ALT: 11 U/L (ref 0–55)
ANION GAP: 8 meq/L (ref 3–11)
AST: 18 U/L (ref 5–34)
Albumin: 3.5 g/dL (ref 3.5–5.0)
Alkaline Phosphatase: 87 U/L (ref 40–150)
BUN: 14.8 mg/dL (ref 7.0–26.0)
CALCIUM: 9.3 mg/dL (ref 8.4–10.4)
CHLORIDE: 108 meq/L (ref 98–109)
CO2: 25 meq/L (ref 22–29)
Creatinine: 0.8 mg/dL (ref 0.6–1.1)
Glucose: 80 mg/dl (ref 70–140)
Potassium: 3.6 mEq/L (ref 3.5–5.1)
SODIUM: 141 meq/L (ref 136–145)
TOTAL PROTEIN: 7 g/dL (ref 6.4–8.3)
Total Bilirubin: 0.36 mg/dL (ref 0.20–1.20)

## 2014-09-25 LAB — CBC WITH DIFFERENTIAL/PLATELET
BASO%: 0.3 % (ref 0.0–2.0)
Basophils Absolute: 0 10*3/uL (ref 0.0–0.1)
EOS ABS: 0.1 10*3/uL (ref 0.0–0.5)
EOS%: 0.8 % (ref 0.0–7.0)
HCT: 35.1 % (ref 34.8–46.6)
HGB: 11.3 g/dL — ABNORMAL LOW (ref 11.6–15.9)
LYMPH#: 2.4 10*3/uL (ref 0.9–3.3)
LYMPH%: 33.6 % (ref 14.0–49.7)
MCH: 26.6 pg (ref 25.1–34.0)
MCHC: 32.2 g/dL (ref 31.5–36.0)
MCV: 82.6 fL (ref 79.5–101.0)
MONO#: 0.6 10*3/uL (ref 0.1–0.9)
MONO%: 7.6 % (ref 0.0–14.0)
NEUT%: 57.7 % (ref 38.4–76.8)
NEUTROS ABS: 4.2 10*3/uL (ref 1.5–6.5)
Platelets: 191 10*3/uL (ref 145–400)
RBC: 4.25 10*6/uL (ref 3.70–5.45)
RDW: 15.1 % — AB (ref 11.2–14.5)
WBC: 7.2 10*3/uL (ref 3.9–10.3)

## 2014-09-25 MED ORDER — ONDANSETRON HCL 8 MG PO TABS: 8.0000 mg | ORAL_TABLET | Freq: Three times a day (TID) | ORAL | Status: DC | PRN

## 2014-09-25 MED ORDER — SODIUM CHLORIDE 0.9 % IJ SOLN
10.0000 mL | INTRAMUSCULAR | Status: DC | PRN
  Administered 2014-09-25: 10 mL via INTRAVENOUS
  Filled 2014-09-25: qty 10

## 2014-09-25 MED ORDER — HEPARIN SOD (PORK) LOCK FLUSH 100 UNIT/ML IV SOLN
500.0000 [IU] | Freq: Once | INTRAVENOUS | Status: AC
  Administered 2014-09-25: 500 [IU] via INTRAVENOUS
  Filled 2014-09-25: qty 5

## 2014-09-25 NOTE — Progress Notes (Signed)
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 She has been treated with total 7 cycles carbo taxol thru 04-25-14, with interval complete debulking by Dr Denman George 01-21-14. She had unremarkable exam by Dr Denman George 08-11-14, however increase in CA 125 marker then, from 8 in 05-2014 to 26 in 08-2014, and repeat 63 on 09-08-14. CT AP 09-16-14 shows progressive peritoneal metastases, increasing retroperitoneal and transverse mesocolon nodes and soft tissue area at vaginal cuff stable to slightly decreased.  Patient has notices some minimal abdominal bloating and remains somewhat more fatigued than her baseline prior to the past year. Bowels are moving normally and appetite is good.  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 No problems with PAC. No SOB or other respiratory symptoms. Slight swelling in feet and ankles, started on low dose diuretic prn by Dr Jenny Reichmann last week, which she has used just a couple of times and has helped. No LE pain.    PAC in Has not had genetics testing CA 125 was 1406 in 10-2013   ONCOLOGIC HISTORY Patient had acute abdominal pain Dec 2014, which resolved without medical attention, then vague diffuse abdominal discomfort, low grade nausea and abdominal bloating. She was seen in June 2015 for first gyn exam in years, PAP with AGUS and follow up biopsies of endocervix and endometrium by Dr Pamala Hurry both with serous apparent endometrial cancer (pathology from [facility] 09-12-13, case # [id number] to be scanned into this EMR). Biopsy of cervix had normal squamous epithelium. CA125 from [facility] 09-16-13 was 964, and was up to 1406 by 10-23-13.Marland Kitchen She was seen by Dr Denman George on 09-23-13, her exam remarkable for large pelvic mass minimally  mobile and extending to umbilicus. She had CT CAP 09-26-13 had prominent but not enlarged juxtapericardiac lymph nodes, trace right pleural effusion, large amount of abnormal soft tissue thruout peritoneal cavity, predominately with omentum and especially LUQ, largest area 3.8 x 3.0 x 4.0 cm anterior to proximal descending colon, implants adjacent to liver, large complex multicystic lesion in pelvis inseperable from uterus and adnexae, with mass effect on bladder, questionable area in central aspect of dome of liver, borderline enlarged retroperitoneal nodes. Due to extent of disease, treatment began with chemotherapy. Cycle one taxol carboplatin was given on 5-64-33, complicated by severe nausea/vomiting, constipation and severe taxol aches. Cycle 2 was changed to dose dense carbo taxol, day 1 on 11-01-13. She needed gCSF support by cycle 3. Neoadjuvant chemo was one cycle carbo taxol standard dose and 3 cycles dose dense. 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 She resumed chemo with cycle 4 on 02-21-14 and completed cycle 6 on 04-25-14. She had unremarkable exam by Dr Denman George 08-11-14, however increase in CA 125 marker then, from 8 in 05-2014 to 26 in 08-2014, and repeat 63 on 09-08-14. CT AP 09-16-14 shows progressive peritoneal metastases, increasing retroperitoneal and transverse mesocolon nodes and soft tissue area at vaginal cuff stable to slightly decreased.      Review of systems as above, also: No fever or recent infectious symptoms. No bleeding. Bladder fine. No new or different pain. Hair is growing back. Remainder of 10 point Review of  Systems negative.  Objective:  Vital signs in last 24 hours:  BP 137/83 mmHg  Pulse 88  Temp(Src) 97.6 F (36.4 C) (Oral)  Resp 18  Ht 5' 4"  (1.626 m)  Wt 249 lb 4.8 oz (113.082 kg)  BMI 42.77 kg/m2  SpO2 100% Weight up 10 lbs Alert,  oriented and appropriate. Ambulatory without difficulty.  Hair growing back.  HEENT:PERRL, sclerae not icteric. Oral mucosa moist without lesions, posterior pharynx clear.  Neck supple. No JVD.  Lymphatics:no cervical,supraclavicular, axillary or inguinal adenopathy Resp: clear to auscultation bilaterally and normal percussion bilaterally Cardio: regular rate and rhythm. No gallop. GI: abdomen obese, soft, nontender, not obviously distended, no appreciable mass or organomegaly. Normally active bowel sounds. Surgical incision not remarkable. Musculoskeletal/ Extremities: without pitting edema, cords, tenderness Neuro: no peripheral neuropathy. Otherwise nonfocal Skin without rash, ecchymosis, petechiae Portacath-without erythema or tenderness  Lab Results:  Results for orders placed or performed in visit on 09/25/14  CBC with Differential  Result Value Ref Range   WBC 7.2 3.9 - 10.3 10e3/uL   NEUT# 4.2 1.5 - 6.5 10e3/uL   HGB 11.3 (L) 11.6 - 15.9 g/dL   HCT 35.1 34.8 - 46.6 %   Platelets 191 145 - 400 10e3/uL   MCV 82.6 79.5 - 101.0 fL   MCH 26.6 25.1 - 34.0 pg   MCHC 32.2 31.5 - 36.0 g/dL   RBC 4.25 3.70 - 5.45 10e6/uL   RDW 15.1 (H) 11.2 - 14.5 %   lymph# 2.4 0.9 - 3.3 10e3/uL   MONO# 0.6 0.1 - 0.9 10e3/uL   Eosinophils Absolute 0.1 0.0 - 0.5 10e3/uL   Basophils Absolute 0.0 0.0 - 0.1 10e3/uL   NEUT% 57.7 38.4 - 76.8 %   LYMPH% 33.6 14.0 - 49.7 %   MONO% 7.6 0.0 - 14.0 %   EOS% 0.8 0.0 - 7.0 %   BASO% 0.3 0.0 - 2.0 %  Comprehensive metabolic panel (Cmet) - [facility]  Result Value Ref Range   Sodium 141 136 - 145 mEq/L   Potassium 3.6 3.5 - 5.1 mEq/L   Chloride 108 98 - 109 mEq/L   CO2 25 22 - 29 mEq/L   Glucose 80 70 - 140 mg/dl   BUN 14.8 7.0 - 26.0 mg/dL   Creatinine 0.8 0.6 - 1.1 mg/dL   Total Bilirubin 0.36 0.20 - 1.20 mg/dL   Alkaline Phosphatase 87 40 - 150 U/L   AST 18 5 - 34 U/L   ALT 11 0 - 55 U/L   Total Protein 7.0 6.4 - 8.3 g/dL   Albumin 3.5 3.5 - 5.0 g/dL    Calcium 9.3 8.4 - 10.4 mg/dL   Anion Gap 8 3 - 11 mEq/L   EGFR >90 >90 ml/min/1.73 m2    CA 125 from 09-08-14:   63  Studies/Results: CT ABDOMEN AND PELVIS WITH CONTRAST  TECHNIQUE: Multidetector CT imaging of the abdomen and pelvis was performed using the standard protocol following bolus administration of intravenous contrast.  CONTRAST: 188m OMNIPAQUE IOHEXOL 300 MG/ML SOLN  COMPARISON: 05/16/2014  FINDINGS: Lower chest: Clear lung bases. Normal heart size without pericardial or pleural effusion. A moderate hiatal hernia.  Hepatobiliary: Probable too small to characterize right hepatic lobe lesion on image 27 is felt to be similar. Normal gallbladder, without biliary ductal dilatation.  Pancreas: Normal, without mass or ductal dilatation.  Spleen: Normal  Adrenals/Urinary Tract: Similar left adrenal thickening. A right adrenal 1.4 cm nodule is not significantly changed. Normal kidneys, without hydronephrosis. Normal  urinary bladder.  Stomach/Bowel: Gastric antral underdistention. Otherwise normal appearance of the remainder of the stomach. Normal colon, appendix, and terminal ileum. Normal small bowel caliber.  Vascular/Lymphatic: Aortic and branch vessel atherosclerosis. 9 mm aortocaval node is increased from 6 mm on image 44. An 8 mm left periaortic node on image 42 measured 4 mm on the prior.  9 mm node in the transverse mesocolon is new on image 24. No pelvic sidewall adenopathy.  Reproductive: Hysterectomy and bilateral oophorectomy. Soft tissue density about the superior and left side of the vaginal cough measures 1.8 x 3.3 cm on image 68 versus 1.8 x 3.7 cm on the prior.  Other: No significant free fluid.  Progressive peritoneal/ omental metastasis. Example omental nodule at 1.5 x 3.1 cm on image 28. Compare 0.8 x 1.0 cm on the prior.  More inferior omental lesion measures 2.0 x 3.5 cm on image 46 versus maximally 7 mm on the  prior.  Musculoskeletal: Probable bone island in the right iliac. Thoracolumbar spondylosis.  IMPRESSION: 1. Moderate progression of omental/peritoneal metastasis. 2. Enlarging retroperitoneal and transverse mesocolonic nodes, suspicious for metastatic disease. 3. Hysterectomy and bilateral oophorectomy. Soft tissue density superior and left of the vaginal cuff for which residual disease cannot be excluded. This is slightly decreased since the prior exam and could alternatively represent postoperative scarring. 4. No evidence of bowel obstruction or other acute complication. 5. Moderate hiatal hernia. 6. Similar right worse than left adrenal nodularity. The right adrenal nodule was favored to represent an adenoma on the prior exam.  PACs images reviewed at time of visit.    Echocardiogram ordered and pending.   Medications: I have reviewed the patient's current medications. Antiemetics refilled.  DISCUSSION:  CA 125 and CT results reviewed with patient and family member now; they know that I am glad to speak with patient's son by phone if he would like. I have carefully explained that treatment will be in attempt to shrink and control the disease, but that we do not anticipate cure. I have explained that given timing of progression <6 months from completion of last carbo taxol, it is generally most helpful to change to a different chemotherapy. Options include doxil, oral etoposide, gemcitabine, avastin, topotecan, alimta. Original path did not have ER PR testing done and which I have requested, as it may be useful for treatment in future. We do not presently have studies for which she would be eligible. I have talked with her in detail about doxil, which is generally tolerated well, and has advantages of every 4 week dosing, less drop in blood counts, no premed steroids, no aches or neuropathy. We discussed careful avoidance of friction to skin for several days after each treatment  and use of lotions with olive oil or coconut oil.  She has had doxil teaching by RN also today. Verbal consent for treatment obtained. n If echocardiogram has no unexpected findings, will begin doxil after July 4 holiday. She prefers treatments on Fridays.   Dr Denman George updated and is in agreement with doxil recommendation.  We did not discuss genetics counseling, which we will need to consider when I see her back.  Assessment/Plan:   1. 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 Recurrent disease found by increasing CA 125 initially 08-2014 and also by CT as noted. Plan to begin doxil q 4 weeks starting July 8 as long as LV  function ok by echocardiogram. I will see her back with labs ~ 2 weeks after first doxil. 2.Appropriate for genetics counseling if she agrees 3 obesity: BMI 42.9. With progression is not eligible for GOG diet and exercise study 4.post left total knee replacement for degenerative arthritis.  5.Anemia: Hgb fairly stable at 11.3 today 6.PAC in 7.neuropathy symptoms minimal in right toes now 8. Environmental allergies: symptomatic rx prn. History of benign positional vertigo.  All questions answered. Verbal consent obtained. Chemo orders entered for preauthorization, tho MD needs to know result of echocardiogram prior to first treatment. Will request precert for neulasta in case counts drop, those orders entered but will not sign until needed. Notified Research, as patient had been considering GOG 0225, but is not eligible with progression.  Cc this note to Drs Shelda Jakes.  Time spent 40 min including >50% counseling and coordination of care.    Gordy Levan, MD   09/25/2014, 8:28 PM

## 2014-09-25 NOTE — Patient Instructions (Signed)

## 2014-09-25 NOTE — Telephone Encounter (Signed)
Gave avs & calendar for July. Sent message for ECHO.

## 2014-09-26 ENCOUNTER — Telehealth: Payer: Self-pay | Admitting: Oncology

## 2014-09-26 NOTE — Telephone Encounter (Signed)
Left message to confirm Echo for 06/30.

## 2014-09-27 DIAGNOSIS — Z79899 Other long term (current) drug therapy: Secondary | ICD-10-CM | POA: Insufficient documentation

## 2014-09-27 DIAGNOSIS — Z95828 Presence of other vascular implants and grafts: Secondary | ICD-10-CM | POA: Insufficient documentation

## 2014-09-29 ENCOUNTER — Ambulatory Visit: Payer: Medicare Other | Admitting: Oncology

## 2014-09-29 ENCOUNTER — Other Ambulatory Visit: Payer: Medicare Other

## 2014-10-02 ENCOUNTER — Ambulatory Visit (HOSPITAL_COMMUNITY)
Admission: RE | Admit: 2014-10-02 | Discharge: 2014-10-02 | Disposition: A | Discharge status: Home / Self Care | Payer: Medicare Other | Source: Ambulatory Visit | Attending: Oncology | Admitting: Oncology

## 2014-10-02 DIAGNOSIS — Z8249 Family history of ischemic heart disease and other diseases of the circulatory system: Secondary | ICD-10-CM | POA: Diagnosis not present

## 2014-10-02 DIAGNOSIS — Z01818 Encounter for other preprocedural examination: Secondary | ICD-10-CM | POA: Insufficient documentation

## 2014-10-02 DIAGNOSIS — C569 Malignant neoplasm of unspecified ovary: Secondary | ICD-10-CM

## 2014-10-02 DIAGNOSIS — Z720 Tobacco use: Secondary | ICD-10-CM | POA: Diagnosis not present

## 2014-10-02 DIAGNOSIS — Z79899 Other long term (current) drug therapy: Secondary | ICD-10-CM | POA: Diagnosis not present

## 2014-10-02 NOTE — Progress Notes (Signed)
  Echocardiogram 2D Echocardiogram has been performed.  Melissa Ball 10/02/2014, 12:32 PM

## 2014-10-03 ENCOUNTER — Encounter: Payer: Self-pay | Admitting: Oncology

## 2014-10-03 ENCOUNTER — Telehealth: Payer: Self-pay | Admitting: *Deleted

## 2014-10-03 NOTE — Progress Notes (Signed)
Medical Oncology  Echocardiogram ok for doxil beginning 10-10-14 as planned. RN to let patient know  L.Kyjuan Gause,MD

## 2014-10-03 NOTE — Telephone Encounter (Signed)
Pt notified of note below. 

## 2014-10-03 NOTE — Telephone Encounter (Signed)
-----   Message from Gordy Levan, MD sent at 10/03/2014 12:35 PM EDT ----- Please let her know echocardiogram is fine for doxil as planned.

## 2014-10-08 ENCOUNTER — Other Ambulatory Visit: Payer: Medicare Other

## 2014-10-09 ENCOUNTER — Telehealth: Payer: Self-pay

## 2014-10-09 NOTE — Telephone Encounter (Signed)
Melissa Ball aware that the eche was fine for the Doxil treatment tomorrow.  She has ~21 Tabs of Ativan on hand for nausea in addition to the Zofran called in on 09-25-14. Reviewed the information on handout given for managing side effects from Doxil.  Melissa Ball verbalized understanding.

## 2014-10-09 NOTE — Telephone Encounter (Signed)
-----   Message from Gordy Levan, MD sent at 09/27/2014 10:06 AM EDT ----- For echo 6-30. I need to see report and we need to let her know if ok for first doxil as planned 7-8. Please review doxil skin care, and see if she needs more ativan when you call her re echo - looks like zofran was refilled on 6-23, but maybe not ativan.  Thank you

## 2014-10-10 ENCOUNTER — Other Ambulatory Visit: Payer: Medicare Other

## 2014-10-10 ENCOUNTER — Other Ambulatory Visit (HOSPITAL_BASED_OUTPATIENT_CLINIC_OR_DEPARTMENT_OTHER): Payer: Medicare Other

## 2014-10-10 ENCOUNTER — Ambulatory Visit: Payer: Medicare Other

## 2014-10-10 ENCOUNTER — Ambulatory Visit (HOSPITAL_BASED_OUTPATIENT_CLINIC_OR_DEPARTMENT_OTHER): Payer: Medicare Other

## 2014-10-10 VITALS — BP 146/85 | HR 95 | Temp 97.8°F | Resp 18

## 2014-10-10 DIAGNOSIS — Z95828 Presence of other vascular implants and grafts: Secondary | ICD-10-CM

## 2014-10-10 DIAGNOSIS — Z5111 Encounter for antineoplastic chemotherapy: Secondary | ICD-10-CM | POA: Diagnosis not present

## 2014-10-10 DIAGNOSIS — C562 Malignant neoplasm of left ovary: Secondary | ICD-10-CM | POA: Diagnosis not present

## 2014-10-10 DIAGNOSIS — C569 Malignant neoplasm of unspecified ovary: Secondary | ICD-10-CM

## 2014-10-10 LAB — CBC WITH DIFFERENTIAL/PLATELET
BASO%: 0.1 % (ref 0.0–2.0)
Basophils Absolute: 0 10*3/uL (ref 0.0–0.1)
EOS%: 0.6 % (ref 0.0–7.0)
Eosinophils Absolute: 0 10*3/uL (ref 0.0–0.5)
HCT: 35.4 % (ref 34.8–46.6)
HGB: 11.4 g/dL — ABNORMAL LOW (ref 11.6–15.9)
LYMPH#: 2.3 10*3/uL (ref 0.9–3.3)
LYMPH%: 32.4 % (ref 14.0–49.7)
MCH: 26.6 pg (ref 25.1–34.0)
MCHC: 32.2 g/dL (ref 31.5–36.0)
MCV: 82.5 fL (ref 79.5–101.0)
MONO#: 0.5 10*3/uL (ref 0.1–0.9)
MONO%: 7.2 % (ref 0.0–14.0)
NEUT#: 4.2 10*3/uL (ref 1.5–6.5)
NEUT%: 59.7 % (ref 38.4–76.8)
Platelets: 203 10*3/uL (ref 145–400)
RBC: 4.29 10*6/uL (ref 3.70–5.45)
RDW: 15.2 % — AB (ref 11.2–14.5)
WBC: 7 10*3/uL (ref 3.9–10.3)

## 2014-10-10 LAB — COMPREHENSIVE METABOLIC PANEL (CC13)
ALBUMIN: 3.5 g/dL (ref 3.5–5.0)
ALT: 11 U/L (ref 0–55)
AST: 17 U/L (ref 5–34)
Alkaline Phosphatase: 93 U/L (ref 40–150)
Anion Gap: 9 mEq/L (ref 3–11)
BUN: 11.6 mg/dL (ref 7.0–26.0)
CO2: 26 meq/L (ref 22–29)
Calcium: 9.7 mg/dL (ref 8.4–10.4)
Chloride: 107 mEq/L (ref 98–109)
Creatinine: 0.8 mg/dL (ref 0.6–1.1)
EGFR: 89 mL/min/{1.73_m2} — ABNORMAL LOW (ref >90)
GLUCOSE: 92 mg/dL (ref 70–140)
POTASSIUM: 3.6 meq/L (ref 3.5–5.1)
Sodium: 141 mEq/L (ref 136–145)
TOTAL PROTEIN: 7 g/dL (ref 6.4–8.3)
Total Bilirubin: 0.46 mg/dL (ref 0.20–1.20)

## 2014-10-10 MED ORDER — SODIUM CHLORIDE 0.9 % IJ SOLN
10.0000 mL | INTRAMUSCULAR | Status: DC | PRN
  Administered 2014-10-10: 10 mL via INTRAVENOUS
  Filled 2014-10-10: qty 10

## 2014-10-10 MED ORDER — SODIUM CHLORIDE 0.9 % IJ SOLN
10.0000 mL | INTRAMUSCULAR | Status: DC | PRN
  Administered 2014-10-10: 10 mL
  Filled 2014-10-10: qty 10

## 2014-10-10 MED ORDER — SODIUM CHLORIDE 0.9 % IV SOLN
Freq: Once | INTRAVENOUS | Status: AC
  Administered 2014-10-10: 12:00:00 via INTRAVENOUS
  Filled 2014-10-10: qty 4

## 2014-10-10 MED ORDER — SODIUM CHLORIDE 0.9 % IV SOLN
Freq: Once | INTRAVENOUS | Status: AC
  Administered 2014-10-10: 12:00:00 via INTRAVENOUS

## 2014-10-10 MED ORDER — HEPARIN SOD (PORK) LOCK FLUSH 100 UNIT/ML IV SOLN
500.0000 [IU] | Freq: Once | INTRAVENOUS | Status: AC | PRN
  Administered 2014-10-10: 500 [IU]
  Filled 2014-10-10: qty 5

## 2014-10-10 MED ORDER — DOXORUBICIN HCL LIPOSOMAL CHEMO INJECTION 2 MG/ML
40.0000 mg/m2 | Freq: Once | INTRAVENOUS | Status: AC
  Administered 2014-10-10: 90 mg via INTRAVENOUS
  Filled 2014-10-10: qty 35

## 2014-10-10 NOTE — Patient Instructions (Signed)
[facility] Discharge Instructions for Patients Receiving Chemothera Today you received the following chemotherapy agents Doxil  To help prevent nausea and vomiting after your treatment, we encourage you to take your nausea medication ,Zofran 8 mg every 8 hours as needed.   If you develop nausea and vomiting that is not controlled by your nausea medication, call the clinic.   BELOW ARE SYMPTOMS THAT SHOULD BE REPORTED IMMEDIATELY:  *FEVER GREATER THAN 100.5 F  *CHILLS WITH OR WITHOUT FEVER  NAUSEA AND VOMITING THAT IS NOT CONTROLLED WITH YOUR NAUSEA MEDICATION  *UNUSUAL SHORTNESS OF BREATH  *UNUSUAL BRUISING OR BLEEDING  TENDERNESS IN MOUTH AND THROAT WITH OR WITHOUT PRESENCE OF ULCERS  *URINARY PROBLEMS  *BOWEL PROBLEMS  UNUSUAL RASH Items with * indicate a potential emergency and should be followed up as soon as possible.  Feel free to call the clinic you have any questions or concerns. The clinic phone number is [phone].  Please show the [place] at check-in to the Emergency Department and triage nurse.   Doxorubicin Liposomal injection What is this medicine? LIPOSOMAL DOXORUBICIN (LIP oh som al dox oh ROO bi sin) is a chemotherapy drug. This medicine is used to treat many kinds of cancer like Kaposi's sarcoma, multiple myeloma, and ovarian cancer. This medicine may be used for other purposes; ask your health care provider or pharmacist if you have questions. COMMON BRAND NAME(S): Doxil, Lipodox What should I tell my health care provider before I take this medicine? They need to know if you have any of these conditions: -blood disorders -heart disease -infection (especially a virus infection such as chickenpox, cold sores, or herpes) -liver disease -recent or ongoing radiation therapy -an unusual or allergic reaction to doxorubicin, other chemotherapy agents, soybeans, other medicines, foods, dyes, or preservatives -pregnant or trying to  get pregnant -breast-feeding How should I use this medicine? This drug is given as an infusion into a vein. It is administered in a hospital or clinic by a specially trained health care professional. If you have pain, swelling, burning or any unusual feeling around the site of your injection, tell your health care professional right away. Talk to your pediatrician regarding the use of this medicine in children. Special care may be needed. Overdosage: If you think you have taken too much of this medicine contact a poison control center or emergency room at once. NOTE: This medicine is only for you. Do not share this medicine with others. What if I miss a dose? It is important not to miss your dose. Call your doctor or health care professional if you are unable to keep an appointment. What may interact with this medicine? Do not take this medicine with any of the following medications: -zidovudine This medicine may also interact with the following medications: -medicines to increase blood counts like filgrastim, pegfilgrastim, sargramostim -vaccines Talk to your doctor or health care professional before taking any of these medicines: -acetaminophen -aspirin -ibuprofen -ketoprofen -naproxen This list may not describe all possible interactions. Give your health care provider a list of all the medicines, herbs, non-prescription drugs, or dietary supplements you use. Also tell them if you smoke, drink alcohol, or use illegal drugs. Some items may interact with your medicine. What should I watch for while using this medicine? Your condition will be monitored carefully while you are receiving this medicine. You will need important blood work done while you are taking this medicine. This drug may make you feel generally unwell. This is  not uncommon, as chemotherapy can affect healthy cells as well as cancer cells. Report any side effects. Continue your course of treatment even though you feel ill  unless your doctor tells you to stop. Your urine may turn orange-red for a few days after your dose. This is not blood. If your urine is dark or brown, call your doctor. In some cases, you may be given additional medicines to help with side effects. Follow all directions for their use. Call your doctor or health care professional for advice if you get a fever (100.5 degrees F or higher), chills or sore throat, or other symptoms of a cold or flu. Do not treat yourself. This drug decreases your body's ability to fight infections. Try to avoid being around people who are sick. This medicine may increase your risk to bruise or bleed. Call your doctor or health care professional if you notice any unusual bleeding. Be careful brushing and flossing your teeth or using a toothpick because you may get an infection or bleed more easily. If you have any dental work done, tell your dentist you are receiving this medicine. Avoid taking products that contain aspirin, acetaminophen, ibuprofen, naproxen, or ketoprofen unless instructed by your doctor. These medicines may hide a fever. Men and women of childbearing age should use effective birth control methods while using taking this medicine. Do not become pregnant while taking this medicine. There is a potential for serious side effects to an unborn child. Talk to your health care professional or pharmacist for more information. Do not breast-feed an infant while taking this medicine. Talk to your doctor about your risk of cancer. You may be more at risk for certain types of cancers if you take this medicine. What side effects may I notice from receiving this medicine? Side effects that you should report to your doctor or health care professional as soon as possible: -allergic reactions like skin rash, itching or hives, swelling of the face, lips, or tongue -low blood counts - this medicine may decrease the number of white blood cells, red blood cells and platelets.  You may be at increased risk for infections and bleeding. -signs of hand-foot syndrome - tingling or burning, redness, flaking, swelling, small blisters, or small sores on the palms of your hands or the soles of your feet -signs of infection - fever or chills, cough, sore throat, pain or difficulty passing urine -signs of decreased platelets or bleeding - bruising, pinpoint red spots on the skin, black, tarry stools, blood in the urine -signs of decreased red blood cells - unusually weak or tired, fainting spells, lightheadedness -back pain, chills, facial flushing, fever, headache, tightness in the chest or throat during the infusion -breathing problems -chest pain -fast, irregular heartbeat -mouth pain, redness, sores -pain, swelling, redness at site where injected -pain, tingling, numbness in the hands or feet -swelling of ankles, feet, or hands -vomiting Side effects that usually do not require medical attention (report to your doctor or health care professional if they continue or are bothersome): -diarrhea -hair loss -loss of appetite -nail discoloration or damage -nausea -red or watery eyes -red colored urine -stomach upset This list may not describe all possible side effects. Call your doctor for medical advice about side effects. You may report side effects to FDA at 1-800-FDA-1088. Where should I keep my medicine? This drug is given in a hospital or clinic and will not be stored at home. NOTE: This sheet is a summary. It may not cover all possible  information. If you have questions about this medicine, talk to your doctor, pharmacist, or health care provider.  2015, [employer] Standard. (2011-12-09 10:12:56)

## 2014-10-10 NOTE — Patient Instructions (Signed)

## 2014-10-11 LAB — CA 125: CA 125: 151 U/mL — ABNORMAL HIGH (ref <35)

## 2014-10-19 ENCOUNTER — Other Ambulatory Visit: Payer: Self-pay | Admitting: Oncology

## 2014-10-23 ENCOUNTER — Other Ambulatory Visit (HOSPITAL_BASED_OUTPATIENT_CLINIC_OR_DEPARTMENT_OTHER): Payer: Medicare Other

## 2014-10-23 ENCOUNTER — Ambulatory Visit (HOSPITAL_BASED_OUTPATIENT_CLINIC_OR_DEPARTMENT_OTHER): Payer: Medicare Other | Admitting: Oncology

## 2014-10-23 ENCOUNTER — Telehealth: Payer: Self-pay | Admitting: Oncology

## 2014-10-23 ENCOUNTER — Encounter: Payer: Self-pay | Admitting: Oncology

## 2014-10-23 ENCOUNTER — Ambulatory Visit (HOSPITAL_BASED_OUTPATIENT_CLINIC_OR_DEPARTMENT_OTHER): Payer: Medicare Other

## 2014-10-23 VITALS — BP 124/82 | HR 97 | Temp 97.5°F | Resp 19 | Ht 64.0 in | Wt 244.6 lb

## 2014-10-23 DIAGNOSIS — C569 Malignant neoplasm of unspecified ovary: Secondary | ICD-10-CM

## 2014-10-23 DIAGNOSIS — D6481 Anemia due to antineoplastic chemotherapy: Secondary | ICD-10-CM

## 2014-10-23 DIAGNOSIS — Z95828 Presence of other vascular implants and grafts: Secondary | ICD-10-CM

## 2014-10-23 DIAGNOSIS — C562 Malignant neoplasm of left ovary: Secondary | ICD-10-CM

## 2014-10-23 DIAGNOSIS — G620 Drug-induced polyneuropathy: Secondary | ICD-10-CM

## 2014-10-23 DIAGNOSIS — T451X5A Adverse effect of antineoplastic and immunosuppressive drugs, initial encounter: Secondary | ICD-10-CM

## 2014-10-23 LAB — CBC WITH DIFFERENTIAL/PLATELET
BASO%: 0.8 % (ref 0.0–2.0)
BASOS ABS: 0.1 10*3/uL (ref 0.0–0.1)
EOS ABS: 0 10*3/uL (ref 0.0–0.5)
EOS%: 0.6 % (ref 0.0–7.0)
HEMATOCRIT: 33.6 % — AB (ref 34.8–46.6)
HGB: 10.7 g/dL — ABNORMAL LOW (ref 11.6–15.9)
LYMPH#: 1.8 10*3/uL (ref 0.9–3.3)
LYMPH%: 24.8 % (ref 14.0–49.7)
MCH: 26.1 pg (ref 25.1–34.0)
MCHC: 31.8 g/dL (ref 31.5–36.0)
MCV: 82.1 fL (ref 79.5–101.0)
MONO#: 0.3 10*3/uL (ref 0.1–0.9)
MONO%: 3.6 % (ref 0.0–14.0)
NEUT%: 70.2 % (ref 38.4–76.8)
NEUTROS ABS: 5 10*3/uL (ref 1.5–6.5)
Platelets: 254 10*3/uL (ref 145–400)
RBC: 4.09 10*6/uL (ref 3.70–5.45)
RDW: 15.2 % — ABNORMAL HIGH (ref 11.2–14.5)
WBC: 7.1 10*3/uL (ref 3.9–10.3)

## 2014-10-23 LAB — COMPREHENSIVE METABOLIC PANEL (CC13)
ALT: 11 U/L (ref 0–55)
AST: 16 U/L (ref 5–34)
Albumin: 3.2 g/dL — ABNORMAL LOW (ref 3.5–5.0)
Alkaline Phosphatase: 96 U/L (ref 40–150)
Anion Gap: 10 mEq/L (ref 3–11)
BILIRUBIN TOTAL: 0.28 mg/dL (ref 0.20–1.20)
BUN: 15.2 mg/dL (ref 7.0–26.0)
CO2: 24 mEq/L (ref 22–29)
Calcium: 9.8 mg/dL (ref 8.4–10.4)
Chloride: 105 mEq/L (ref 98–109)
Creatinine: 1 mg/dL (ref 0.6–1.1)
EGFR: 69 mL/min/{1.73_m2} — ABNORMAL LOW (ref >90)
GLUCOSE: 91 mg/dL (ref 70–140)
Potassium: 4 mEq/L (ref 3.5–5.1)
Sodium: 139 mEq/L (ref 136–145)
Total Protein: 7.5 g/dL (ref 6.4–8.3)

## 2014-10-23 MED ORDER — SODIUM CHLORIDE 0.9 % IJ SOLN
10.0000 mL | INTRAMUSCULAR | Status: DC | PRN
  Administered 2014-10-23: 10 mL via INTRAVENOUS
  Filled 2014-10-23: qty 10

## 2014-10-23 MED ORDER — UREA 10 % EX CREA: TOPICAL_CREAM | Freq: Three times a day (TID) | CUTANEOUS | Status: DC

## 2014-10-23 MED ORDER — HEPARIN SOD (PORK) LOCK FLUSH 100 UNIT/ML IV SOLN
500.0000 [IU] | Freq: Once | INTRAVENOUS | Status: AC
  Administered 2014-10-23: 500 [IU] via INTRAVENOUS
  Filled 2014-10-23: qty 5

## 2014-10-23 MED ORDER — POLYETHYLENE GLYCOL POWD: Status: DC

## 2014-10-23 NOTE — Patient Instructions (Signed)

## 2014-10-23 NOTE — Progress Notes (Signed)
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 She tolerated the first doxil without difficulty. Echocardiogram 10-02-14 had EF 50-55%, no regional wall motion abnormalities.   Patient has had no nausea. She has had more upper abdominal gas symptoms. Bowels are moving but not always well, needs to begin daily miralax. She has had some sensitivity hands and feet, without erythema or swelling; will use ice packs to hands and feet with subsequent doxil infusions and begin urea 10% cream to palms and soles tid. Rest of skin has not had irritation. She denies increased SOB or chest pain.   PAC in Has not had genetics testing CA 125 was 1406 in 10-2013 and was 151 as baseline for doxil on 10-10-14   Patient had 200 relatives on July [facility] [place] [facility], [place] now lives.  ONCOLOGIC HISTORY Patient had acute abdominal pain Dec 2014, which resolved without medical attention, then vague diffuse abdominal discomfort, low grade nausea and abdominal bloating. She was seen in June 2015 for first gyn exam in years, PAP with AGUS and follow up biopsies of endocervix and endometrium by Dr Pamala Hurry both with serous apparent endometrial cancer (pathology from [facility] 09-12-13, case # [id number] to be scanned into this EMR). Biopsy of cervix had normal squamous epithelium. CA125 from [facility] 09-16-13 was 964, and was up to 1406 by 10-23-13.Marland Kitchen She was seen by Dr Denman George on 09-23-13, her exam remarkable for large pelvic mass minimally mobile and extending to umbilicus. She had CT CAP 09-26-13 had prominent but not enlarged juxtapericardiac lymph nodes, trace right pleural effusion, large amount of abnormal soft tissue thruout peritoneal cavity, predominately with  omentum and especially LUQ, largest area 3.8 x 3.0 x 4.0 cm anterior to proximal descending colon, implants adjacent to liver, large complex multicystic lesion in pelvis inseperable from uterus and adnexae, with mass effect on bladder, questionable area in central aspect of dome of liver, borderline enlarged retroperitoneal nodes. Due to extent of disease, treatment began with chemotherapy. Cycle one taxol carboplatin was given on 8-45-36, complicated by severe nausea/vomiting, constipation and severe taxol aches. Cycle 2 was changed to dose dense carbo taxol, day 1 on 11-01-13. She needed gCSF support by cycle 3. Neoadjuvant chemo was one cycle carbo taxol standard dose and 3 cycles dose dense. 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 She resumed chemo with cycle 4 on 02-21-14 and completed cycle 6 on 04-25-14. She had unremarkable exam by Dr Denman George 08-11-14, however increase in CA 125 marker then, from 8 in 05-2014 to 26 in 08-2014, and repeat 63 on 09-08-14. CT AP 09-16-14 shows progressive peritoneal metastases, increasing retroperitoneal and transverse mesocolon nodes and soft tissue area at vaginal cuff stable to slightly decreased.      Review of systems as above, also: No fever or symptoms of infection. No LE swelling or pain. No problems with PAC.  Remainder of 10 point Review of Systems negative.  Objective:  Vital signs in last 24 hours: Weight down 5 lbs to 244, BMI 42 . BP 124/82. HR 97 regular. Resp 19 not labored  Temp 97.5   Alert, oriented and appropriate. Ambulatory  without assistance.   No alopecia  HEENT:PERRL, sclerae not icteric. Oral mucosa moist without lesions, posterior pharynx clear.  Neck supple. No JVD.  Lymphatics:no cervical,supraclavicular or inguinal adenopathy Resp: clear to auscultation bilaterally and normal percussion bilaterally Cardio:  regular rate and rhythm. No gallop. GI: abdomen obese, soft, nontender, not distended, no appreciable mass or organomegaly. Normally active bowel sounds. Surgical incision not remarkable. Musculoskeletal/ Extremities: without pitting edema, cords, tenderness Neuro: no change peripheral neuropathy. Otherwise nonfocal Skin without rash, ecchymosis, petechiae. No erythema or puffiness palms of hands, no desquamation Portacath-without erythema or tenderness  Lab Results: Labs from 10-10-14 noted, including CA 125 151, this having been 63 on 09-08-14   Studies/Results: CT AP 09-16-14 as noted. Echocardiogram 10-02-14 as noted  Medications: I have reviewed the patient's current medications. Add miralax daily. Add urea 10% cream to palms and soles tid.  DISCUSSION: interval history and recommendations as above. We have discussed very clearly that there are not interventions that can cure this malignancy, and that treatment is in attempt to keep the disease controlled and least symptomatic possible. She does want to continue treatment now, understands that she can tell me at any point if she does not want to continue, and that likewise I will tell her if contraindications to treatment or if not appropriate to continue. Patient to call if she is very fatigued next week, would recheck CBC then if so, as counts may drop some further from first doxil  Assessment/Plan:  1. 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 Recurrent disease found by increasing CA 125 initially 08-2014 and also by CT as noted. First doxil 10-10-14, tolerated very adequately. She will have second doxil on 11-07-14 as long as ANC >=1.5 and plt >=100k, ice packs to hands and feet. I will see her back after that treatment also. 2.Appropriate for genetics counseling if she agrees 3 obesity: BMI 42.9. With progression is not  eligible for GOG diet and exercise study 4.post left total knee replacement for degenerative arthritis.  5.Anemia: Hgb somewhat lower at 10.7 today. Iron studies low 11-2013, will recheck with next blood draw. Continue po iron for now. 6.PAC in 7.neuropathy symptoms minimal in right toes now 8. Environmental allergies: symptomatic rx prn. History of benign positional vertigo.   All questions answered and patient seems to understand situation well, is in agreement with plans above. Chemo orders confirmed. Time spent 25 min including >50% counseling and coordination of care.   Makilah Dowda P, MD   10/23/2014, 9:52 AM

## 2014-10-23 NOTE — Telephone Encounter (Signed)
Gave avs & calendar for August °

## 2014-11-07 ENCOUNTER — Ambulatory Visit: Payer: Medicare Other

## 2014-11-07 ENCOUNTER — Ambulatory Visit (HOSPITAL_BASED_OUTPATIENT_CLINIC_OR_DEPARTMENT_OTHER): Payer: Medicare Other

## 2014-11-07 ENCOUNTER — Telehealth: Payer: Self-pay | Admitting: Medical Oncology

## 2014-11-07 ENCOUNTER — Other Ambulatory Visit (HOSPITAL_BASED_OUTPATIENT_CLINIC_OR_DEPARTMENT_OTHER): Payer: Medicare Other

## 2014-11-07 DIAGNOSIS — D6481 Anemia due to antineoplastic chemotherapy: Secondary | ICD-10-CM

## 2014-11-07 DIAGNOSIS — C562 Malignant neoplasm of left ovary: Secondary | ICD-10-CM | POA: Diagnosis not present

## 2014-11-07 DIAGNOSIS — C569 Malignant neoplasm of unspecified ovary: Secondary | ICD-10-CM

## 2014-11-07 DIAGNOSIS — Z5111 Encounter for antineoplastic chemotherapy: Secondary | ICD-10-CM

## 2014-11-07 DIAGNOSIS — T451X5A Adverse effect of antineoplastic and immunosuppressive drugs, initial encounter: Secondary | ICD-10-CM

## 2014-11-07 DIAGNOSIS — Z95828 Presence of other vascular implants and grafts: Secondary | ICD-10-CM

## 2014-11-07 LAB — CBC WITH DIFFERENTIAL/PLATELET
BASO%: 0.9 % (ref 0.0–2.0)
BASOS ABS: 0 10*3/uL (ref 0.0–0.1)
EOS ABS: 0 10*3/uL (ref 0.0–0.5)
EOS%: 0.5 % (ref 0.0–7.0)
HCT: 34.3 % — ABNORMAL LOW (ref 34.8–46.6)
HGB: 10.8 g/dL — ABNORMAL LOW (ref 11.6–15.9)
LYMPH%: 34.4 % (ref 14.0–49.7)
MCH: 25.8 pg (ref 25.1–34.0)
MCHC: 31.4 g/dL — AB (ref 31.5–36.0)
MCV: 82.1 fL (ref 79.5–101.0)
MONO#: 0.7 10*3/uL (ref 0.1–0.9)
MONO%: 11.2 % (ref 0.0–14.0)
NEUT%: 53 % (ref 38.4–76.8)
NEUTROS ABS: 3.1 10*3/uL (ref 1.5–6.5)
PLATELETS: 262 10*3/uL (ref 145–400)
RBC: 4.18 10*6/uL (ref 3.70–5.45)
RDW: 16.1 % — ABNORMAL HIGH (ref 11.2–14.5)
WBC: 5.8 10*3/uL (ref 3.9–10.3)
lymph#: 2 10*3/uL (ref 0.9–3.3)

## 2014-11-07 LAB — COMPREHENSIVE METABOLIC PANEL (CC13)
ALT: 11 U/L (ref 0–55)
ANION GAP: 6 meq/L (ref 3–11)
AST: 14 U/L (ref 5–34)
Albumin: 3.3 g/dL — ABNORMAL LOW (ref 3.5–5.0)
Alkaline Phosphatase: 83 U/L (ref 40–150)
BUN: 14.2 mg/dL (ref 7.0–26.0)
CHLORIDE: 112 meq/L — AB (ref 98–109)
CO2: 26 meq/L (ref 22–29)
Calcium: 9.5 mg/dL (ref 8.4–10.4)
Creatinine: 0.8 mg/dL (ref 0.6–1.1)
EGFR: 84 mL/min/{1.73_m2} — ABNORMAL LOW (ref >90)
Glucose: 90 mg/dl (ref 70–140)
Potassium: 3.8 mEq/L (ref 3.5–5.1)
Sodium: 144 mEq/L (ref 136–145)
TOTAL PROTEIN: 6.8 g/dL (ref 6.4–8.3)
Total Bilirubin: <0.2 mg/dL (ref 0.20–1.20)

## 2014-11-07 LAB — IRON AND TIBC CHCC
%SAT: 17 % — ABNORMAL LOW (ref 21–57)
Iron: 53 ug/dL (ref 41–142)
TIBC: 306 ug/dL (ref 236–444)
UIBC: 254 ug/dL (ref 120–384)

## 2014-11-07 MED ORDER — HEPARIN SOD (PORK) LOCK FLUSH 100 UNIT/ML IV SOLN
500.0000 [IU] | Freq: Once | INTRAVENOUS | Status: AC
  Administered 2014-11-07: 500 [IU] via INTRAVENOUS
  Filled 2014-11-07: qty 5

## 2014-11-07 MED ORDER — HEPARIN SOD (PORK) LOCK FLUSH 100 UNIT/ML IV SOLN
500.0000 [IU] | Freq: Once | INTRAVENOUS | Status: AC | PRN
  Administered 2014-11-07: 500 [IU]
  Filled 2014-11-07: qty 5

## 2014-11-07 MED ORDER — DEXAMETHASONE SODIUM PHOSPHATE 100 MG/10ML IJ SOLN
Freq: Once | INTRAMUSCULAR | Status: AC
  Administered 2014-11-07: 11:00:00 via INTRAVENOUS
  Filled 2014-11-07: qty 4

## 2014-11-07 MED ORDER — SODIUM CHLORIDE 0.9 % IJ SOLN
10.0000 mL | INTRAMUSCULAR | Status: DC | PRN
  Administered 2014-11-07: 10 mL
  Filled 2014-11-07: qty 10

## 2014-11-07 MED ORDER — SODIUM CHLORIDE 0.9 % IV SOLN
Freq: Once | INTRAVENOUS | Status: AC
  Administered 2014-11-07: 11:00:00 via INTRAVENOUS

## 2014-11-07 MED ORDER — SODIUM CHLORIDE 0.9 % IJ SOLN
10.0000 mL | INTRAMUSCULAR | Status: DC | PRN
  Administered 2014-11-07: 10 mL via INTRAVENOUS
  Filled 2014-11-07: qty 10

## 2014-11-07 MED ORDER — DOXORUBICIN HCL LIPOSOMAL CHEMO INJECTION 2 MG/ML
40.0000 mg/m2 | Freq: Once | INTRAVENOUS | Status: AC
  Administered 2014-11-07: 90 mg via INTRAVENOUS
  Filled 2014-11-07: qty 45

## 2014-11-07 NOTE — Telephone Encounter (Signed)
Pt. Did come in for treatment today 11-07-14. LM apologizing for having incorrect information.

## 2014-11-07 NOTE — Telephone Encounter (Signed)
I left message for pt to call re appt today

## 2014-11-07 NOTE — Patient Instructions (Signed)
[place] Discharge Instructions for Patients Receiving Chemotherapy  Today you received the following chemotherapy agents Doxil  To help prevent nausea and vomiting after your treatment, we encourage you to take your nausea medication as instructed by Dr [phone]   If you develop nausea and vomiting that is not controlled by your nausea medication, call the clinic.   BELOW ARE SYMPTOMS THAT SHOULD BE REPORTED IMMEDIATELY:  *FEVER GREATER THAN 100.5 F  *CHILLS WITH OR WITHOUT FEVER  NAUSEA AND VOMITING THAT IS NOT CONTROLLED WITH YOUR NAUSEA MEDICATION  *UNUSUAL SHORTNESS OF BREATH  *UNUSUAL BRUISING OR BLEEDING  TENDERNESS IN MOUTH AND THROAT WITH OR WITHOUT PRESENCE OF ULCERS  *URINARY PROBLEMS  *BOWEL PROBLEMS  UNUSUAL RASH Items with * indicate a potential emergency and should be followed up as soon as possible.  Feel free to call the clinic you have any questions or concerns. The clinic phone number is [phone].  Please show the [place] at check-in to the Emergency Department and triage nurse.

## 2014-11-07 NOTE — Telephone Encounter (Signed)
Called patient to follow up regarding miss appointment for chemotherapy.

## 2014-11-10 LAB — CA 125: CA 125: 160 U/mL — AB (ref <35)

## 2014-11-19 ENCOUNTER — Other Ambulatory Visit: Payer: Self-pay | Admitting: Oncology

## 2014-11-20 ENCOUNTER — Other Ambulatory Visit (HOSPITAL_BASED_OUTPATIENT_CLINIC_OR_DEPARTMENT_OTHER): Payer: Medicare Other

## 2014-11-20 ENCOUNTER — Ambulatory Visit (HOSPITAL_BASED_OUTPATIENT_CLINIC_OR_DEPARTMENT_OTHER): Payer: Medicare Other | Admitting: Oncology

## 2014-11-20 ENCOUNTER — Telehealth: Payer: Self-pay | Admitting: Oncology

## 2014-11-20 ENCOUNTER — Encounter: Payer: Self-pay | Admitting: Oncology

## 2014-11-20 ENCOUNTER — Ambulatory Visit (HOSPITAL_BASED_OUTPATIENT_CLINIC_OR_DEPARTMENT_OTHER): Payer: Medicare Other

## 2014-11-20 VITALS — BP 132/77 | HR 87 | Temp 97.8°F | Resp 17 | Ht 64.0 in | Wt 241.9 lb

## 2014-11-20 DIAGNOSIS — C569 Malignant neoplasm of unspecified ovary: Secondary | ICD-10-CM

## 2014-11-20 DIAGNOSIS — G620 Drug-induced polyneuropathy: Secondary | ICD-10-CM | POA: Diagnosis not present

## 2014-11-20 DIAGNOSIS — T451X5A Adverse effect of antineoplastic and immunosuppressive drugs, initial encounter: Secondary | ICD-10-CM

## 2014-11-20 DIAGNOSIS — D6481 Anemia due to antineoplastic chemotherapy: Secondary | ICD-10-CM

## 2014-11-20 DIAGNOSIS — C562 Malignant neoplasm of left ovary: Secondary | ICD-10-CM

## 2014-11-20 DIAGNOSIS — D509 Iron deficiency anemia, unspecified: Secondary | ICD-10-CM

## 2014-11-20 DIAGNOSIS — Z95828 Presence of other vascular implants and grafts: Secondary | ICD-10-CM

## 2014-11-20 LAB — CBC WITH DIFFERENTIAL/PLATELET
BASO%: 0.3 % (ref 0.0–2.0)
BASOS ABS: 0 10*3/uL (ref 0.0–0.1)
EOS%: 0.5 % (ref 0.0–7.0)
Eosinophils Absolute: 0 10*3/uL (ref 0.0–0.5)
HEMATOCRIT: 35.4 % (ref 34.8–46.6)
HGB: 11.4 g/dL — ABNORMAL LOW (ref 11.6–15.9)
LYMPH%: 27.8 % (ref 14.0–49.7)
MCH: 26.5 pg (ref 25.1–34.0)
MCHC: 32.1 g/dL (ref 31.5–36.0)
MCV: 82.5 fL (ref 79.5–101.0)
MONO#: 0.3 10*3/uL (ref 0.1–0.9)
MONO%: 4 % (ref 0.0–14.0)
NEUT#: 4.3 10*3/uL (ref 1.5–6.5)
NEUT%: 67.4 % (ref 38.4–76.8)
PLATELETS: 226 10*3/uL (ref 145–400)
RBC: 4.29 10*6/uL (ref 3.70–5.45)
RDW: 15.9 % — ABNORMAL HIGH (ref 11.2–14.5)
WBC: 6.3 10*3/uL (ref 3.9–10.3)
lymph#: 1.8 10*3/uL (ref 0.9–3.3)

## 2014-11-20 LAB — COMPREHENSIVE METABOLIC PANEL (CC13)
ALT: 10 U/L (ref 0–55)
ANION GAP: 9 meq/L (ref 3–11)
AST: 14 U/L (ref 5–34)
Albumin: 3.4 g/dL — ABNORMAL LOW (ref 3.5–5.0)
Alkaline Phosphatase: 89 U/L (ref 40–150)
BUN: 12.7 mg/dL (ref 7.0–26.0)
CHLORIDE: 108 meq/L (ref 98–109)
CO2: 26 meq/L (ref 22–29)
CREATININE: 0.9 mg/dL (ref 0.6–1.1)
Calcium: 9.9 mg/dL (ref 8.4–10.4)
EGFR: 83 mL/min/{1.73_m2} — ABNORMAL LOW (ref >90)
Glucose: 92 mg/dl (ref 70–140)
Potassium: 4 mEq/L (ref 3.5–5.1)
Sodium: 142 mEq/L (ref 136–145)
Total Bilirubin: 0.33 mg/dL (ref 0.20–1.20)
Total Protein: 7 g/dL (ref 6.4–8.3)

## 2014-11-20 MED ORDER — SODIUM CHLORIDE 0.9 % IJ SOLN
10.0000 mL | INTRAMUSCULAR | Status: DC | PRN
  Administered 2014-11-20: 10 mL via INTRAVENOUS
  Filled 2014-11-20: qty 10

## 2014-11-20 MED ORDER — ONDANSETRON HCL 8 MG PO TABS: 8.0000 mg | ORAL_TABLET | Freq: Three times a day (TID) | ORAL | Status: DC | PRN

## 2014-11-20 MED ORDER — HEPARIN SOD (PORK) LOCK FLUSH 100 UNIT/ML IV SOLN
500.0000 [IU] | Freq: Once | INTRAVENOUS | Status: AC
  Administered 2014-11-20: 500 [IU] via INTRAVENOUS
  Filled 2014-11-20: qty 5

## 2014-11-20 NOTE — Telephone Encounter (Signed)
Gave avs & calendar for September °

## 2014-11-20 NOTE — Progress Notes (Signed)
OFFICE PROGRESS NOTE   November 20, 2014   Physicians:Emma Nada Maclachlan, MD , Aloha Gell  INTERVAL HISTORY:  Patient is seen, together with niece, in continuing attention to recurrent high grade serous left ovarian carcinoma, which progressed within 4-5 months of initial carbo taxol. She received cycle 2 doxil on 11-07-14, tolerated well. She is asymptomatic from the recurrent disease. She has not needed gCSF with this regimen. Next appointment with Dr Denman George 02-13-15.  Patient has had no skin irritation or hand foot syndrome with doxil thus far, is using lotion and doing other skin care as instructed; she has some hyperpigmentation palms and tongue. She had a little nausea for just a few days after chemo, improved with prn antiemetics; appetite and taste otherwise fine. Bowels are moving regularly, using prune juice daily and prn laxative. She is not more fatigued. She has no new or different pain.   PAC in Has not had genetics testing CA 125 was 1406 in 10-2013 and was 151 as baseline for doxil on 10-10-14   ONCOLOGIC HISTORY Patient had acute abdominal pain Dec 2014, which resolved without medical attention, then vague diffuse abdominal discomfort, low grade nausea and abdominal bloating. She was seen in June 2015 for first gyn exam in years, PAP with AGUS and follow up biopsies of endocervix and endometrium by Dr Pamala Hurry both with serous apparent endometrial cancer (pathology from [facility] 09-12-13, case # [id number] to be scanned into this EMR). Biopsy of cervix had normal squamous epithelium. CA125 from [facility] 09-16-13 was 964, and was up to 1406 by 10-23-13.Marland Kitchen She was seen by Dr Denman George on 09-23-13, her exam remarkable for large pelvic mass minimally mobile and extending to umbilicus. She had CT CAP 09-26-13 had prominent but not enlarged juxtapericardiac lymph nodes, trace right pleural effusion, large amount of abnormal soft tissue thruout peritoneal cavity, predominately  with omentum and especially LUQ, largest area 3.8 x 3.0 x 4.0 cm anterior to proximal descending colon, implants adjacent to liver, large complex multicystic lesion in pelvis inseperable from uterus and adnexae, with mass effect on bladder, questionable area in central aspect of dome of liver, borderline enlarged retroperitoneal nodes. Due to extent of disease, treatment began with chemotherapy. Cycle one taxol carboplatin was given on 2-99-37, complicated by severe nausea/vomiting, constipation and severe taxol aches. Cycle 2 was changed to dose dense carbo taxol, day 1 on 11-01-13. She needed gCSF support by cycle 3. Neoadjuvant chemo was one cycle carbo taxol standard dose and 3 cycles dose dense. 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 She resumed chemo with cycle 4 on 02-21-14 and completed cycle 6 on 04-25-14. She had unremarkable exam by Dr Denman George 08-11-14, however increase in CA 125 marker then, from 8 in 05-2014 to 26 in 08-2014, and repeat 63 on 09-08-14. CT AP 09-16-14 shows progressive peritoneal metastases, increasing retroperitoneal and transverse mesocolon nodes and soft tissue area at vaginal cuff stable to slightly decreased. She began doxil on 10-10-14, CA 125 151 that day as baseline for doxil.      Review of systems as above, also: No fever or symptoms of infection. No LE pain. No SOB with present activity. No problems with PAC. Remainder of 10 point Review of Systems negative.  Objective:  Vital signs in last 24 hours:  BP 132/77 mmHg  Pulse 87  Temp(Src) 97.8 F (36.6 C) (Oral)  Resp 17  Ht 5' 4"  (1.626 m)  Wt 241 lb 14.4 oz (109.725 kg)  BMI 41.50 kg/m2  SpO2 100% Weight down 3 lbs Alert, oriented and appropriate. Ambulatory without assistance difficulty.  No alopecia tho is wearing wig.  HEENT:PERRL, sclerae not icteric. Oral mucosa moist without lesions,  posterior pharynx clear.  Neck supple. No JVD.  Lymphatics:no cervical,supraclavicular or inguinal adenopathy Resp: clear to auscultation bilaterally and normal percussion bilaterally Cardio: regular rate and rhythm. No gallop. GI: abdomen obese, soft, nontender, not distended, no appreciable mass or organomegaly. Normally active bowel sounds. Surgical incision not remarkable. Musculoskeletal/ Extremities: without pitting edema, cords, tenderness Neuro: no change peripheral neuropathy. Otherwise nonfocal. PSYCH appropriate mood and affect Skin without rash, ecchymosis, petechiae. Patchy dark discoloration tongue and palms. Skin elsewhere a little dry. Portacath-without erythema or tenderness  Lab Results:  Results for orders placed or performed in visit on 11/20/14  CBC with Differential  Result Value Ref Range   WBC 6.3 3.9 - 10.3 10e3/uL   NEUT# 4.3 1.5 - 6.5 10e3/uL   HGB 11.4 (L) 11.6 - 15.9 g/dL   HCT 35.4 34.8 - 46.6 %   Platelets 226 145 - 400 10e3/uL   MCV 82.5 79.5 - 101.0 fL   MCH 26.5 25.1 - 34.0 pg   MCHC 32.1 31.5 - 36.0 g/dL   RBC 4.29 3.70 - 5.45 10e6/uL   RDW 15.9 (H) 11.2 - 14.5 %   lymph# 1.8 0.9 - 3.3 10e3/uL   MONO# 0.3 0.1 - 0.9 10e3/uL   Eosinophils Absolute 0.0 0.0 - 0.5 10e3/uL   Basophils Absolute 0.0 0.0 - 0.1 10e3/uL   NEUT% 67.4 38.4 - 76.8 %   LYMPH% 27.8 14.0 - 49.7 %   MONO% 4.0 0.0 - 14.0 %   EOS% 0.5 0.0 - 7.0 %   BASO% 0.3 0.0 - 2.0 %  Comprehensive metabolic panel (Cmet) - [facility]  Result Value Ref Range   Sodium 142 136 - 145 mEq/L   Potassium 4.0 3.5 - 5.1 mEq/L   Chloride 108 98 - 109 mEq/L   CO2 26 22 - 29 mEq/L   Glucose 92 70 - 140 mg/dl   BUN 12.7 7.0 - 26.0 mg/dL   Creatinine 0.9 0.6 - 1.1 mg/dL   Total Bilirubin 0.33 0.20 - 1.20 mg/dL   Alkaline Phosphatase 89 40 - 150 U/L   AST 14 5 - 34 U/L   ALT 10 0 - 55 U/L   Total Protein 7.0 6.4 - 8.3 g/dL   Albumin 3.4 (L) 3.5 - 5.0 g/dL   Calcium 9.9 8.4 - 10.4 mg/dL   Anion Gap 9 3  - 11 mEq/L   EGFR 83 (L) >90 ml/min/1.73 m2   CA 125 on 11-07-14   160, having been 151 day of first doxil 10-10-14 and 63 on 09-08-14  Studies/Results:  No results found.  Medications: I have reviewed the patient's current medications.  DISCUSSION: we have discussed CA 125 information, this possibly showing some early leveling out on doxil. Discussed increased pigmentation hands and tongue, which is upsetting to her but not otherwise of concern and will improve when she is off the medication.  Encouraged good skin care and again encouraged her to let nurses use ice packs on hands and feet during chemo (which she does not remember happening with cycle 2 treatment either - note instructions are in chemo orders).  Otherwise she is pleased with how well she has tolerated doxil and in agreement with  continuing. Will cancel MD and lab on 8-25 as she is doing well, will treat 9-2 as long as ANC >=1.5 and plt >=100k  Assessment/Plan: 1. 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 Recurrent disease found by increasing CA 125 initially 08-2014 and also by CT as noted. Tolerating doxil well, no symptoms obvious from recurrent disease and rate of rise CA 125 may be improving. I will see her back 2-3 weeks after cycle 3.  2.Appropriate for genetics counseling if she agrees 3 obesity: BMI 42.9. 4.post left total knee replacement for degenerative arthritis.  5.Anemia: iron deficiency and chemo. Hgb better at 11.4 today. Iron studies low 11-07-14.  Continue po iron for now. 6.PAC in 7.neuropathy symptoms minimal in right toes now, from taxol 8. Environmental allergies: symptomatic rx prn. History of benign positional vertigo. 9.does not have Advance Directives   All questions answered. Chemo orders in place. Time spent 25 min including >50% counseling and coordination of care.     LIVESAY,LENNIS P, MD   11/20/2014, 9:21 AM

## 2014-11-27 ENCOUNTER — Other Ambulatory Visit: Payer: Medicare Other

## 2014-11-27 ENCOUNTER — Ambulatory Visit: Payer: Medicare Other | Admitting: Oncology

## 2014-12-05 ENCOUNTER — Other Ambulatory Visit (HOSPITAL_BASED_OUTPATIENT_CLINIC_OR_DEPARTMENT_OTHER): Payer: Medicare Other

## 2014-12-05 ENCOUNTER — Ambulatory Visit (HOSPITAL_BASED_OUTPATIENT_CLINIC_OR_DEPARTMENT_OTHER): Payer: Medicare Other

## 2014-12-05 ENCOUNTER — Ambulatory Visit: Payer: Medicare Other

## 2014-12-05 VITALS — BP 146/82 | HR 86 | Temp 98.0°F

## 2014-12-05 DIAGNOSIS — C562 Malignant neoplasm of left ovary: Secondary | ICD-10-CM

## 2014-12-05 DIAGNOSIS — Z5111 Encounter for antineoplastic chemotherapy: Secondary | ICD-10-CM

## 2014-12-05 DIAGNOSIS — C569 Malignant neoplasm of unspecified ovary: Secondary | ICD-10-CM

## 2014-12-05 DIAGNOSIS — Z95828 Presence of other vascular implants and grafts: Secondary | ICD-10-CM

## 2014-12-05 LAB — COMPREHENSIVE METABOLIC PANEL (CC13)
ALBUMIN: 3.5 g/dL (ref 3.5–5.0)
ALK PHOS: 96 U/L (ref 40–150)
ALT: 13 U/L (ref 0–55)
AST: 19 U/L (ref 5–34)
Anion Gap: 8 mEq/L (ref 3–11)
BUN: 14.2 mg/dL (ref 7.0–26.0)
CALCIUM: 9.7 mg/dL (ref 8.4–10.4)
CO2: 25 meq/L (ref 22–29)
CREATININE: 0.8 mg/dL (ref 0.6–1.1)
Chloride: 108 mEq/L (ref 98–109)
EGFR: 85 mL/min/{1.73_m2} — AB (ref >90)
Glucose: 91 mg/dl (ref 70–140)
Potassium: 4.1 mEq/L (ref 3.5–5.1)
Sodium: 141 mEq/L (ref 136–145)
TOTAL PROTEIN: 7.2 g/dL (ref 6.4–8.3)
Total Bilirubin: 0.2 mg/dL (ref 0.20–1.20)

## 2014-12-05 LAB — CBC WITH DIFFERENTIAL/PLATELET
BASO%: 0.2 % (ref 0.0–2.0)
Basophils Absolute: 0 10*3/uL (ref 0.0–0.1)
EOS ABS: 0 10*3/uL (ref 0.0–0.5)
EOS%: 0.6 % (ref 0.0–7.0)
HEMATOCRIT: 34.9 % (ref 34.8–46.6)
HGB: 11.1 g/dL — ABNORMAL LOW (ref 11.6–15.9)
LYMPH#: 1.7 10*3/uL (ref 0.9–3.3)
LYMPH%: 34.5 % (ref 14.0–49.7)
MCH: 26.4 pg (ref 25.1–34.0)
MCHC: 31.8 g/dL (ref 31.5–36.0)
MCV: 83.1 fL (ref 79.5–101.0)
MONO#: 0.6 10*3/uL (ref 0.1–0.9)
MONO%: 12.7 % (ref 0.0–14.0)
NEUT%: 52 % (ref 38.4–76.8)
NEUTROS ABS: 2.6 10*3/uL (ref 1.5–6.5)
Platelets: 238 10*3/uL (ref 145–400)
RBC: 4.2 10*6/uL (ref 3.70–5.45)
RDW: 16.9 % — ABNORMAL HIGH (ref 11.2–14.5)
WBC: 5 10*3/uL (ref 3.9–10.3)

## 2014-12-05 MED ORDER — SODIUM CHLORIDE 0.9 % IV SOLN
Freq: Once | INTRAVENOUS | Status: AC
  Administered 2014-12-05: 10:00:00 via INTRAVENOUS
  Filled 2014-12-05: qty 4

## 2014-12-05 MED ORDER — SODIUM CHLORIDE 0.9 % IJ SOLN
10.0000 mL | INTRAMUSCULAR | Status: DC | PRN
  Administered 2014-12-05: 10 mL via INTRAVENOUS
  Filled 2014-12-05: qty 10

## 2014-12-05 MED ORDER — HEPARIN SOD (PORK) LOCK FLUSH 100 UNIT/ML IV SOLN
500.0000 [IU] | Freq: Once | INTRAVENOUS | Status: AC | PRN
  Administered 2014-12-05: 500 [IU]
  Filled 2014-12-05: qty 5

## 2014-12-05 MED ORDER — SODIUM CHLORIDE 0.9 % IJ SOLN
10.0000 mL | INTRAMUSCULAR | Status: DC | PRN
  Administered 2014-12-05: 10 mL
  Filled 2014-12-05: qty 10

## 2014-12-05 MED ORDER — SODIUM CHLORIDE 0.9 % IV SOLN
Freq: Once | INTRAVENOUS | Status: AC
  Administered 2014-12-05: 10:00:00 via INTRAVENOUS

## 2014-12-05 MED ORDER — DOXORUBICIN HCL LIPOSOMAL CHEMO INJECTION 2 MG/ML
40.0000 mg/m2 | Freq: Once | INTRAVENOUS | Status: AC
  Administered 2014-12-05: 90 mg via INTRAVENOUS
  Filled 2014-12-05: qty 45

## 2014-12-05 NOTE — Patient Instructions (Signed)

## 2014-12-05 NOTE — Patient Instructions (Signed)
[facility] Discharge Instructions for Patients Receiving Chemotherapy  Today you received the following chemotherapy agents doxil  To help prevent nausea and vomiting after your treatment, we encourage you to take your nausea medication as directed. If you develop nausea and vomiting that is not controlled by your nausea medication, call the clinic.   BELOW ARE SYMPTOMS THAT SHOULD BE REPORTED IMMEDIATELY:  *FEVER GREATER THAN 100.5 F  *CHILLS WITH OR WITHOUT FEVER  NAUSEA AND VOMITING THAT IS NOT CONTROLLED WITH YOUR NAUSEA MEDICATION  *UNUSUAL SHORTNESS OF BREATH  *UNUSUAL BRUISING OR BLEEDING  TENDERNESS IN MOUTH AND THROAT WITH OR WITHOUT PRESENCE OF ULCERS  *URINARY PROBLEMS  *BOWEL PROBLEMS  UNUSUAL RASH Items with * indicate a potential emergency and should be followed up as soon as possible.  Feel free to call the clinic you have any questions or concerns. The clinic phone number is [phone].  

## 2014-12-06 LAB — CA 125: CA 125: 155 U/mL — AB (ref <35)

## 2014-12-08 ENCOUNTER — Other Ambulatory Visit: Payer: Self-pay | Admitting: Oncology

## 2014-12-08 DIAGNOSIS — C569 Malignant neoplasm of unspecified ovary: Secondary | ICD-10-CM

## 2014-12-12 ENCOUNTER — Telehealth: Payer: Self-pay | Admitting: Oncology

## 2014-12-12 NOTE — Telephone Encounter (Signed)
Returned Advertising account executive. Left message to confirm appointment change per patient going out of town 09/15, [place] appointment to 09/22

## 2014-12-12 NOTE — Telephone Encounter (Signed)
Patient called in to reschedule her appointments °

## 2014-12-18 ENCOUNTER — Ambulatory Visit: Payer: Medicare Other | Admitting: Oncology

## 2014-12-18 ENCOUNTER — Other Ambulatory Visit: Payer: Medicare Other

## 2014-12-22 ENCOUNTER — Telehealth: Payer: Self-pay | Admitting: Oncology

## 2014-12-22 ENCOUNTER — Other Ambulatory Visit: Payer: Self-pay | Admitting: Oncology

## 2014-12-22 ENCOUNTER — Telehealth: Payer: Self-pay | Admitting: *Deleted

## 2014-12-22 NOTE — Telephone Encounter (Signed)
Called patient after voicemail that she has "been itching, itching, and itching on right leg for two weeks.  Bumps also on my leg.  I also have ankle swelling.  I thought it was a bug bite but it's not getting better."   Sits on the porch a lot wearing "mosquito repellant but lots of things flying around still.  I had a few red bumps on my right leg a few weeks ago.  They're all over now and today I notice about six bumps on my left arm.  I used alcohol and neosporin which helped some.  My hands and feet are dark.  I noticed the swelling when I came back on a long train ride last Thursday morning.  I also need to know what I can take for constipation.  I have a clear- lax powder I take at times."  Denies pain or drainage to labs.   Advised to keep legs elevated and drink fluids. For hand foot syndrome to avoid hot temperatures and excessive walking and hand use.       Asked if she can come in today and no transportation.  P.O.F. Entered for evaluation of 'rash' tomorrow morning.

## 2014-12-22 NOTE — Telephone Encounter (Signed)
Added [facility] appointment for tomorrow. Date/time/pt aware per pof.

## 2014-12-23 ENCOUNTER — Encounter: Payer: Self-pay | Admitting: Nurse Practitioner

## 2014-12-23 ENCOUNTER — Other Ambulatory Visit: Payer: Self-pay | Admitting: Nurse Practitioner

## 2014-12-23 ENCOUNTER — Ambulatory Visit (HOSPITAL_BASED_OUTPATIENT_CLINIC_OR_DEPARTMENT_OTHER): Payer: Medicare Other | Admitting: Nurse Practitioner

## 2014-12-23 VITALS — BP 124/76 | HR 93 | Temp 97.8°F | Resp 17 | Ht 64.0 in | Wt 247.1 lb

## 2014-12-23 DIAGNOSIS — C569 Malignant neoplasm of unspecified ovary: Secondary | ICD-10-CM | POA: Diagnosis not present

## 2014-12-23 DIAGNOSIS — R609 Edema, unspecified: Secondary | ICD-10-CM | POA: Insufficient documentation

## 2014-12-23 DIAGNOSIS — R21 Rash and other nonspecific skin eruption: Secondary | ICD-10-CM | POA: Diagnosis not present

## 2014-12-23 DIAGNOSIS — L271 Localized skin eruption due to drugs and medicaments taken internally: Secondary | ICD-10-CM

## 2014-12-23 NOTE — Progress Notes (Signed)
SYMPTOM MANAGEMENT CLINIC   HPI: Melissa Ball 64 y.o. female diagnosed with ovarian cancer.  Patient is status post total hysterectomy.  Currently undergoing Doxil chemotherapy regimen.  Patient received her third cycle of Doxil chemotherapy on 12/05/2014.  Patient reports development of scattered, pruritic rash to her extremities.  Within the past week or so.  She states that she has been applying and alcohol and Neosporin to the rash with only minimal effectiveness.  She denies any other hypersensitivity symptoms whatsoever.  Patient is also noticed some mild erythema to the palms of her hands and some discoloration to the tip of her tongue.  She also reports some trace edema to her lower extremities following a trip out of state to visit her grandchildren.  Patient denies any recent fevers or chills.    HPI  ROS  Past Medical History  Diagnosis Date  . Hypertension   . Hyperlipidemia   . Arthritis   . GERD (gastroesophageal reflux disease)   . History of transfusion   . Ovarian cancer     Past Surgical History  Procedure Laterality Date  . Joint replacement  2012    L KNEE  . Portacath placement    . Robotic assisted total hysterectomy with bilateral salpingo oopherectomy N/A 01/21/2014    Procedure: ROBOTIC ASSISTED TOTAL HYSTERECTOMY WITH BILATERAL SALPINGO OOPHORECTOMY with omentectomy;  Surgeon: Everitt Amber, MD;  Location: [facility];  Service: Gynecology;  Laterality: N/A;    has Hypertension; Hyperlipidemia; Preventative health care; GERD (gastroesophageal reflux disease); Right shoulder pain; Unspecified constipation; Osteoarthritis, shoulder; Ovarian cancer; S/P total abdominal hysterectomy and bilateral salpingo-oophorectomy; Obesity, morbid, BMI 40.0-49.9; Port catheter in place; Peripheral neuropathy due to chemotherapy; Antineoplastic chemotherapy induced anemia; Portacath in place; High risk medication use; Rash; Peripheral edema; and Hand foot syndrome on her  problem list.    has No Known Allergies.    Medication List       This list is accurate as of: 12/23/14 10:37 AM.  Always use your most recent med list.               acetaminophen 500 MG tablet  Commonly known as:  TYLENOL  Take 500-1,000 mg by mouth as needed for mild pain.     GAS-X PO  Take 1 tablet by mouth 2 (two) times daily as needed.     lidocaine-prilocaine cream  Commonly known as:  EMLA  Apply 1 application topically See admin instructions. Apply to Porta-Cath 1-2 hours prior to access as directed.     meclizine 25 MG tablet  Commonly known as:  ANTIVERT  Take 25 mg by mouth every 6 (six) hours as needed for dizziness.     multivitamin capsule  Take 1 capsule by mouth every other day.     ondansetron 8 MG tablet  Commonly known as:  ZOFRAN  Take 1 tablet (8 mg total) by mouth every 8 (eight) hours as needed for nausea or vomiting.     Polyethylene Glycol Powd  Take 1 capful =17 grams 1-2 times a day as needed for constipatation     urea 10 % cream  Commonly known as:  CARMOL  Apply topically 3 (three) times daily.         PHYSICAL EXAMINATION  Oncology Vitals 12/23/2014 12/05/2014 11/20/2014 10/23/2014 10/10/2014 09/25/2014 08/13/2014  Height 163 cm - 163 cm 163 cm - 163 cm -  Weight 112.084 kg - 109.725 kg 110.95 kg - 113.082 kg 109.825 kg  Weight (lbs)  247 lbs 2 oz - 241 lbs 14 oz 244 lbs 10 oz - 249 lbs 5 oz 242 lbs 2 oz  BMI (kg/m2) 42.41 kg/m2 - 41.52 kg/m2 41.99 kg/m2 - 42.79 kg/m2 -  Temp 97.8 98 97.8 97.5 97.8 97.6 97.6  Pulse 93 86 87 97 95 88 88  Resp 17 - _0 -  SpO2 100 100 100 100 98 100 98  BSA (m2) 2.25 m2 - 2.23 m2 2.24 m2 - 2.26 m2 -   BP Readings from Last 3 Encounters:  12/23/14 124/76  12/05/14 146/82  11/20/14 132/77    Physical Exam  Constitutional: She is oriented to person, place, and time and well-developed, well-nourished, and in no distress.  HENT:  Head: Normocephalic and atraumatic.  Mouth/Throat: Oropharynx  is clear and moist.  Patient has one area to the tip of her anterior tongue that has darkened.  No evidence of oral lesions.  Eyes: Conjunctivae and EOM are normal. Pupils are equal, round, and reactive to light. Right eye exhibits no discharge. Left eye exhibits no discharge. No scleral icterus.  Neck: Normal range of motion.  Pulmonary/Chest: Effort normal. No respiratory distress.  Musculoskeletal: Normal range of motion. She exhibits no edema or tenderness.  Neurological: She is alert and oriented to person, place, and time. Gait normal.  Skin: Skin is warm and dry. Rash noted. No erythema. No pallor.  On exam.-It does appear the patient has some mild, scattered rash to all of her extremities; with the most rash to her right lower leg.  It does appear the patient has been scratching at the rash; with some scabbing to the rash.  No evidence of active infection to the sites.  Trace erythema to the palms of bilateral hands.    Psychiatric: Affect normal.  Nursing note and vitals reviewed.   LABORATORY DATA:. No visits with results within 3 Day(s) from this visit. Latest known visit with results is:  Appointment on 12/05/2014  Component Date Value Ref Range Status  . WBC 12/05/2014 5.0  3.9 - 10.3 10e3/uL Final  . NEUT# 12/05/2014 2.6  1.5 - 6.5 10e3/uL Final  . HGB 12/05/2014 11.1* 11.6 - 15.9 g/dL Final  . HCT 12/05/2014 34.9  34.8 - 46.6 % Final  . Platelets 12/05/2014 238  145 - 400 10e3/uL Final  . MCV 12/05/2014 83.1  79.5 - 101.0 fL Final  . MCH 12/05/2014 26.4  25.1 - 34.0 pg Final  . MCHC 12/05/2014 31.8  31.5 - 36.0 g/dL Final  . RBC 12/05/2014 4.20  3.70 - 5.45 10e6/uL Final  . RDW 12/05/2014 16.9* 11.2 - 14.5 % Final  . lymph# 12/05/2014 1.7  0.9 - 3.3 10e3/uL Final  . MONO# 12/05/2014 0.6  0.1 - 0.9 10e3/uL Final  . Eosinophils Absolute 12/05/2014 0.0  0.0 - 0.5 10e3/uL Final  . Basophils Absolute 12/05/2014 0.0  0.0 - 0.1 10e3/uL Final  . NEUT% 12/05/2014 52.0  38.4  - 76.8 % Final  . LYMPH% 12/05/2014 34.5  14.0 - 49.7 % Final  . MONO% 12/05/2014 12.7  0.0 - 14.0 % Final  . EOS% 12/05/2014 0.6  0.0 - 7.0 % Final  . BASO% 12/05/2014 0.2  0.0 - 2.0 % Final  . Sodium 12/05/2014 141  136 - 145 mEq/L Final  . Potassium 12/05/2014 4.1  3.5 - 5.1 mEq/L Final  . Chloride 12/05/2014 108  98 - 109 mEq/L Final  . CO2 12/05/2014 25  22 - 29 mEq/L Final  .  Glucose 12/05/2014 91  70 - 140 mg/dl Final  . BUN 12/05/2014 14.2  7.0 - 26.0 mg/dL Final  . Creatinine 12/05/2014 0.8  0.6 - 1.1 mg/dL Final  . Total Bilirubin 12/05/2014 0.20  0.20 - 1.20 mg/dL Final  . Alkaline Phosphatase 12/05/2014 96  40 - 150 U/L Final  . AST 12/05/2014 19  5 - 34 U/L Final  . ALT 12/05/2014 13  0 - 55 U/L Final  . Total Protein 12/05/2014 7.2  6.4 - 8.3 g/dL Final  . Albumin 12/05/2014 3.5  3.5 - 5.0 g/dL Final  . Calcium 12/05/2014 9.7  8.4 - 10.4 mg/dL Final  . Anion Gap 12/05/2014 8  3 - 11 mEq/L Final  . EGFR 12/05/2014 85* >90 ml/min/1.73 m2 Final   eGFR is calculated using the CKD-EPI Creatinine Equation (2009)  . CA 125 12/05/2014 155* <35 U/mL Final   Comment:  This test was performed using the [place] chemiluminescentmethod.  Values obtained from different assay methods cannot be usedinterchangeably.  CA125 levels , regardless of value, should not beinterpreted as absolute evidence of the presence or  absence ofdisease.      RADIOGRAPHIC STUDIES: No results found.  ASSESSMENT/PLAN:    Rash Patient received her third cycle of Doxil chemotherapy on 12/05/2014.  Patient reports development of scattered, pruritic rash to her extremities.  Within the past week or so.  She states that she has been applying and alcohol and Neosporin to the rash with only minimal effectiveness.  She denies any other hypersensitivity symptoms whatsoever.  On exam.-It does appear the patient has some mild, scattered rash to all of her extremities; with the most rash to her right lower  leg.  It does appear the patient has been scratching at the rash; with some scabbing to the rash.  No evidence of active infection to the sites.  Patient was advised to try Benadryl 25 mg every 6 hours and Pepcid 20 mg every 12 hours for treatment of her rash.  She may also try hydrocortisone to the rash as well.  Patient was advised to call/return or go directly to the emergency department for any worsening symptoms whatsoever.    Peripheral edema Patient is noted to have some very mild peripheral edema to her bilateral lower extremities.  Patient states she just returned from a trip to see her grandchildren; it has not been elevating her feet very often.  On exam.  Patient does have trace/+1 edema to her bilateral lower extremities.  Patient denies any chest pain, chest pressure, shortness breath, or pain with inspiration.  Patient was encouraged to elevate her feet above the level of heart whenever possible; and to remain as active as possible.  We'll continue to monitor.  Ovarian cancer Patient received cycle 3 of her Doxil chemotherapy on 12/05/2014.  Patient is scheduled to obtain labs and a follow-up visit with Dr. Marko Plume on 01/05/2015.  Hand foot syndrome Patient reports some mild hyperpigmentation/erythema to the palms of her hands; and is also noted some mild sensitivity to her palms as well.  She is also noticed some hyperpigmentation of her tongue.  Within the past few weeks.  On exam.-Patient does have one dark spot to the anterior tongue.  She has no oral lesions.  Patient has some trace erythema to the palms of her hands only.  There is no cracking of the skin or nails.  On exam.  There is no obvious skin breakdown.  Long discussion with both patient and  her family member regarding the appropriate treatment of hand/foot syndrome.  Patient was given Aquaphor samples to try; and was advised to keep her hands well moisturized, protected from extremes in temperature, and to wear  cotton socks and gloves after intense moisturizing at night.    Patient stated understanding of all instructions; and was in agreement with this plan of care. The patient knows to call the clinic with any problems, questions or concerns.   Review/collaboration with Dr. Marko Plume regarding all aspects of patient's visit today.   Total time spent with patient was 25 minutes;  with greater than 75 percent of that time spent in face to face counseling regarding patient's symptoms,  and coordination of care and follow up.  Disclaimer:This dictation was prepared with Dragon/digital dictation along with [employer]. Any transcriptional errors that result from this process are unintentional.  Drue Second, NP 12/23/2014

## 2014-12-23 NOTE — Assessment & Plan Note (Signed)
Patient received cycle 3 of her Doxil chemotherapy on 12/05/2014.  Patient is scheduled to obtain labs and a follow-up visit with Dr. Marko Plume on 01/05/2015.

## 2014-12-23 NOTE — Assessment & Plan Note (Signed)
Patient reports some mild hyperpigmentation/erythema to the palms of her hands; and is also noted some mild sensitivity to her palms as well.  She is also noticed some hyperpigmentation of her tongue.  Within the past few weeks.  On exam.-Patient does have one dark spot to the anterior tongue.  She has no oral lesions.  Patient has some trace erythema to the palms of her hands only.  There is no cracking of the skin or nails.  On exam.  There is no obvious skin breakdown.  Long discussion with both patient and her family member regarding the appropriate treatment of hand/foot syndrome.  Patient was given Aquaphor samples to try; and was advised to keep her hands well moisturized, protected from extremes in temperature, and to wear cotton socks and gloves after intense moisturizing at night.

## 2014-12-23 NOTE — Telephone Encounter (Signed)
Seen in [facility] today.

## 2014-12-23 NOTE — Assessment & Plan Note (Signed)
Patient is noted to have some very mild peripheral edema to her bilateral lower extremities.  Patient states she just returned from a trip to see her grandchildren; it has not been elevating her feet very often.  On exam.  Patient does have trace/+1 edema to her bilateral lower extremities.  Patient denies any chest pain, chest pressure, shortness breath, or pain with inspiration.  Patient was encouraged to elevate her feet above the level of heart whenever possible; and to remain as active as possible.  We'll continue to monitor.

## 2014-12-23 NOTE — Assessment & Plan Note (Signed)
Patient received her third cycle of Doxil chemotherapy on 12/05/2014.  Patient reports development of scattered, pruritic rash to her extremities.  Within the past week or so.  She states that she has been applying and alcohol and Neosporin to the rash with only minimal effectiveness.  She denies any other hypersensitivity symptoms whatsoever.  On exam.-It does appear the patient has some mild, scattered rash to all of her extremities; with the most rash to her right lower leg.  It does appear the patient has been scratching at the rash; with some scabbing to the rash.  No evidence of active infection to the sites.  Patient was advised to try Benadryl 25 mg every 6 hours and Pepcid 20 mg every 12 hours for treatment of her rash.  She may also try hydrocortisone to the rash as well.  Patient was advised to call/return or go directly to the emergency department for any worsening symptoms whatsoever.

## 2014-12-24 ENCOUNTER — Telehealth: Payer: Self-pay | Admitting: *Deleted

## 2014-12-25 ENCOUNTER — Ambulatory Visit: Payer: Medicare Other | Admitting: Oncology

## 2014-12-25 ENCOUNTER — Other Ambulatory Visit: Payer: Medicare Other

## 2014-12-31 ENCOUNTER — Other Ambulatory Visit: Payer: Self-pay | Admitting: Oncology

## 2014-12-31 DIAGNOSIS — C562 Malignant neoplasm of left ovary: Secondary | ICD-10-CM

## 2015-01-05 ENCOUNTER — Encounter: Payer: Self-pay | Admitting: Oncology

## 2015-01-05 ENCOUNTER — Ambulatory Visit (HOSPITAL_BASED_OUTPATIENT_CLINIC_OR_DEPARTMENT_OTHER): Payer: Medicare Other | Admitting: Oncology

## 2015-01-05 ENCOUNTER — Ambulatory Visit (HOSPITAL_BASED_OUTPATIENT_CLINIC_OR_DEPARTMENT_OTHER): Payer: Medicare Other

## 2015-01-05 ENCOUNTER — Telehealth: Payer: Self-pay | Admitting: Oncology

## 2015-01-05 ENCOUNTER — Other Ambulatory Visit (HOSPITAL_BASED_OUTPATIENT_CLINIC_OR_DEPARTMENT_OTHER): Payer: Medicare Other

## 2015-01-05 VITALS — BP 142/91 | HR 89 | Temp 97.6°F | Resp 18 | Ht 64.0 in | Wt 242.0 lb

## 2015-01-05 DIAGNOSIS — Z23 Encounter for immunization: Secondary | ICD-10-CM | POA: Diagnosis not present

## 2015-01-05 DIAGNOSIS — C562 Malignant neoplasm of left ovary: Secondary | ICD-10-CM

## 2015-01-05 DIAGNOSIS — R21 Rash and other nonspecific skin eruption: Secondary | ICD-10-CM

## 2015-01-05 DIAGNOSIS — Z95828 Presence of other vascular implants and grafts: Secondary | ICD-10-CM

## 2015-01-05 DIAGNOSIS — D509 Iron deficiency anemia, unspecified: Secondary | ICD-10-CM | POA: Diagnosis not present

## 2015-01-05 DIAGNOSIS — C569 Malignant neoplasm of unspecified ovary: Secondary | ICD-10-CM

## 2015-01-05 DIAGNOSIS — D6481 Anemia due to antineoplastic chemotherapy: Secondary | ICD-10-CM

## 2015-01-05 DIAGNOSIS — Z79899 Other long term (current) drug therapy: Secondary | ICD-10-CM

## 2015-01-05 DIAGNOSIS — L271 Localized skin eruption due to drugs and medicaments taken internally: Secondary | ICD-10-CM

## 2015-01-05 DIAGNOSIS — T451X5A Adverse effect of antineoplastic and immunosuppressive drugs, initial encounter: Secondary | ICD-10-CM

## 2015-01-05 LAB — CBC WITH DIFFERENTIAL/PLATELET
BASO%: 0.7 % (ref 0.0–2.0)
BASOS ABS: 0 10*3/uL (ref 0.0–0.1)
EOS ABS: 0 10*3/uL (ref 0.0–0.5)
EOS%: 0.5 % (ref 0.0–7.0)
HEMATOCRIT: 37 % (ref 34.8–46.6)
HGB: 11.8 g/dL (ref 11.6–15.9)
LYMPH#: 1.8 10*3/uL (ref 0.9–3.3)
LYMPH%: 27.7 % (ref 14.0–49.7)
MCH: 26.4 pg (ref 25.1–34.0)
MCHC: 31.8 g/dL (ref 31.5–36.0)
MCV: 82.9 fL (ref 79.5–101.0)
MONO#: 0.8 10*3/uL (ref 0.1–0.9)
MONO%: 11.5 % (ref 0.0–14.0)
NEUT#: 3.9 10*3/uL (ref 1.5–6.5)
NEUT%: 59.6 % (ref 38.4–76.8)
PLATELETS: 234 10*3/uL (ref 145–400)
RBC: 4.47 10*6/uL (ref 3.70–5.45)
RDW: 17.9 % — AB (ref 11.2–14.5)
WBC: 6.6 10*3/uL (ref 3.9–10.3)

## 2015-01-05 LAB — COMPREHENSIVE METABOLIC PANEL (CC13)
ALBUMIN: 3.5 g/dL (ref 3.5–5.0)
ALK PHOS: 90 U/L (ref 40–150)
ALT: 12 U/L (ref 0–55)
ANION GAP: 9 meq/L (ref 3–11)
AST: 18 U/L (ref 5–34)
BUN: 12.8 mg/dL (ref 7.0–26.0)
CALCIUM: 9.6 mg/dL (ref 8.4–10.4)
CO2: 24 mEq/L (ref 22–29)
Chloride: 110 mEq/L — ABNORMAL HIGH (ref 98–109)
Creatinine: 0.9 mg/dL (ref 0.6–1.1)
EGFR: 83 mL/min/{1.73_m2} — ABNORMAL LOW (ref >90)
Glucose: 89 mg/dl (ref 70–140)
POTASSIUM: 4 meq/L (ref 3.5–5.1)
Sodium: 143 mEq/L (ref 136–145)
Total Bilirubin: <0.3 mg/dL (ref 0.20–1.20)
Total Protein: 7.2 g/dL (ref 6.4–8.3)

## 2015-01-05 LAB — CA 125: CA 125: 192 U/mL — AB (ref <35)

## 2015-01-05 MED ORDER — ONDANSETRON HCL 8 MG PO TABS: 8.0000 mg | ORAL_TABLET | Freq: Three times a day (TID) | ORAL | Status: DC | PRN

## 2015-01-05 MED ORDER — HEPARIN SOD (PORK) LOCK FLUSH 100 UNIT/ML IV SOLN
500.0000 [IU] | Freq: Once | INTRAVENOUS | Status: AC
  Administered 2015-01-05: 500 [IU] via INTRAVENOUS
  Filled 2015-01-05: qty 5

## 2015-01-05 MED ORDER — SODIUM CHLORIDE 0.9 % IJ SOLN
10.0000 mL | INTRAMUSCULAR | Status: DC | PRN
  Administered 2015-01-05: 10 mL via INTRAVENOUS
  Filled 2015-01-05: qty 10

## 2015-01-05 MED ORDER — INFLUENZA VAC SPLIT QUAD 0.5 ML IM SUSY
0.5000 mL | PREFILLED_SYRINGE | Freq: Once | INTRAMUSCULAR | Status: AC
  Administered 2015-01-05: 0.5 mL via INTRAMUSCULAR
  Filled 2015-01-05: qty 0.5

## 2015-01-05 NOTE — Progress Notes (Signed)
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  She had #3 doxil on 12-05-14, then rescheduled follow up visit such that next treatment has not been set up. She has not needed gCSF with doxil.  Last echocardiogram was prior to cycle 1 doxil (echo 10-02-14 and first doxil 10-10-14). She will see Dr Denman George on 02-13-15, should have repeat CT prior.  Patient again did not use ice to hands and feet during doxil infusion, despite discussion with MD and despite instructions in chemo orders. She had more tenderness palms and soles with slight desquamation, improved now. She otherwise did well with most recent doxil, with no nausea or vomiting, excellent appetite, no abdominal or pelvic pain, no hair loss. Constipation is improved, bowels now moving daily with just prn miralax. Energy has been good, including trip to [place] to see granddaughter play volleyball.     PAC in Genetics counseling requested now CA 125 was 1406 in 10-2013 and was 151 as baseline for doxil on 10-10-14 Flu vaccine given today   ONCOLOGIC HISTORY Patient had acute abdominal pain Dec 2014, which resolved without medical attention, then vague diffuse abdominal discomfort, low grade nausea and abdominal bloating. She was seen in June 2015 for first gyn exam in years, PAP with AGUS and follow up biopsies of endocervix and endometrium by Dr Pamala Hurry both with serous apparent endometrial cancer (pathology from [facility] 09-12-13, case # [id number] to be scanned into this EMR). Biopsy of cervix had normal squamous epithelium. CA125 from [facility] 09-16-13 was 964, and was up to 1406 by 10-23-13.Marland Kitchen She was seen by Dr Denman George on 09-23-13, her exam remarkable for large  pelvic mass minimally mobile and extending to umbilicus. She had CT CAP 09-26-13 had prominent but not enlarged juxtapericardiac lymph nodes, trace right pleural effusion, large amount of abnormal soft tissue thruout peritoneal cavity, predominately with omentum and especially LUQ, largest area 3.8 x 3.0 x 4.0 cm anterior to proximal descending colon, implants adjacent to liver, large complex multicystic lesion in pelvis inseperable from uterus and adnexae, with mass effect on bladder, questionable area in central aspect of dome of liver, borderline enlarged retroperitoneal nodes. Due to extent of disease, treatment began with chemotherapy. Cycle one taxol carboplatin was given on 07-27-93, complicated by severe nausea/vomiting, constipation and severe taxol aches. Cycle 2 was changed to dose dense carbo taxol, day 1 on 11-01-13. She needed gCSF support by cycle 3. Neoadjuvant chemo was one cycle carbo taxol standard dose and 3 cycles dose dense. 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 She resumed chemo with cycle 4 on 02-21-14 and completed cycle 6 on 04-25-14. She had unremarkable exam by Dr Denman George 08-11-14, however increase in CA 125 marker then, from 8 in 05-2014 to 26 in 08-2014, and repeat 63 on 09-08-14. CT AP 09-16-14 shows progressive peritoneal metastases, increasing retroperitoneal and transverse mesocolon nodes and soft tissue area at vaginal cuff stable to slightly decreased. She began doxil on 10-10-14, CA 125 151 that day as baseline for doxil.     Review of systems as above, also: No fever or symptoms of infection. Feels well enough to  do choir activities as well as recent trip to [place], hopes to go back to [place] for Thanksgiving. No LE swelling. Some pruritic rash on lower legs better with OTC hydrocortisone cream bid, happened after she had been sitting on porch, tho not aware  of insects. No new or progressive rash since that was evaluated at symptom management clinic on 12-23-14. Remainder of 10 point Review of Systems negative.  Objective:  Vital signs in last 24 hours:  BP 142/91 mmHg  Pulse 89  Temp(Src) 97.6 F (36.4 C) (Oral)  Resp 18  Ht _0  (1.626 m)  Wt 242 lb (109.77 kg)  BMI 41.52 kg/m2  SpO2 100% Weight up 1 lb. Looks very comfortable, respirations not labored RA. Alert, oriented and appropriate, very pleasant as always. Ambulatory without difficulty.  No alopecia  HEENT:PERRL, sclerae not icteric. Oral mucosa moist without lesions, posterior pharynx clear.  Neck supple. No JVD.  Lymphatics:no cervical,supraclavicular or inguinal adenopathy Resp: clear to auscultation bilaterally and normal percussion bilaterally Cardio: regular rate and rhythm. No gallop. GI: abdomen obese, soft, nontender, not distended, no mass or organomegaly. Normally active bowel sounds. Surgical incision not remarkable. Musculoskeletal/ Extremities: without pitting edema, cords, tenderness Neuro: no change peripheral neuropathy. Otherwise nonfocal. PSYCH appropriate mood and affect Skin without ecchymosis, petechiae.. Scattered resolving papular lesions lower leg with some excoriation  And hyperpigmentation, no suprainfection, not dermatomal, look mostly like resolving insect bites. Palms with some hyperpigmentation and minimal dry desquamation, not puffy or tender now. Portacath-without erythema or tenderness  Lab Results:  Results for orders placed or performed in visit on 01/05/15  CBC with Differential  Result Value Ref Range   WBC 6.6 3.9 - 10.3 10e3/uL   NEUT# 3.9 1.5 - 6.5 10e3/uL   HGB 11.8 11.6 - 15.9 g/dL   HCT 37.0 34.8 - 46.6 %   Platelets 234 145 - 400 10e3/uL   MCV 82.9 79.5 - 101.0 fL   MCH 26.4 25.1 - 34.0 pg   MCHC 31.8 31.5 - 36.0 g/dL   RBC 4.47 3.70 - 5.45 10e6/uL   RDW 17.9 (H) 11.2 - 14.5 %   lymph# 1.8 0.9 - 3.3 10e3/uL   MONO# 0.8  0.1 - 0.9 10e3/uL   Eosinophils Absolute 0.0 0.0 - 0.5 10e3/uL   Basophils Absolute 0.0 0.0 - 0.1 10e3/uL   NEUT% 59.6 38.4 - 76.8 %   LYMPH% 27.7 14.0 - 49.7 %   MONO% 11.5 0.0 - 14.0 %   EOS% 0.5 0.0 - 7.0 %   BASO% 0.7 0.0 - 2.0 %  Comprehensive metabolic panel (Cmet) - [facility]  Result Value Ref Range   Sodium 143 136 - 145 mEq/L   Potassium 4.0 3.5 - 5.1 mEq/L   Chloride 110 (H) 98 - 109 mEq/L   CO2 24 22 - 29 mEq/L   Glucose 89 70 - 140 mg/dl   BUN 12.8 7.0 - 26.0 mg/dL   Creatinine 0.9 0.6 - 1.1 mg/dL   Total Bilirubin <0.30 0.20 - 1.20 mg/dL   Alkaline Phosphatase 90 40 - 150 U/L   AST 18 5 - 34 U/L   ALT 12 0 - 55 U/L   Total Protein 7.2 6.4 - 8.3 g/dL   Albumin 3.5 3.5 - 5.0 g/dL   Calcium 9.6 8.4 - 10.4 mg/dL   Anion Gap 9 3 - 11 mEq/L   EGFR 83 (L) >90 ml/min/1.73 m2    CA 125 available after visit 192 , which will be communicated to  her  Studies/Results:  No results found. Echocardiogram soon and CT AP shortly prior to gyn oncology follow up ordered  Medications: I have reviewed the patient's current medications. OK to increase miralax to daily as needed.  DISCUSSION: Patient had #3 doxil on 12-05-14, then rescheduled follow up visit from planned 12-18-14 such that next treatment has not been set up. Patient is disappointed to hear that she is not "finished" with chemo, which apparently she had thought was the case with no treatment appointments on her schedule. I have carefully reviewed with patient and niece the fact that treatment of recurrent gyn cancer is done in attempt to control and minimize disease as much and as long as possible. We have discussed using chemotherapy to maximal effect, then, if doing well including by scans, possibly going on treatment break with close observation, but with expectation that she will need to go back onto some treatment later. I believe that she understands.  We have discussed again need to use ice packs to hands and feet during  doxil infusion, I have explained to them that I ask nursing to use ice x 10 min pre and post doxil as well as during the infusion.  I have talked with them about genetics counseling due to diagnosis of serous ovarian cancer and strongly encouraged her to proceed with this, as it could impact her treatment and also may be important for family members. She agrees now, tho seems reluctant and may need further discussion despite my efforts now.  She will need another echocardiogram soon due to # of doxil cycles given.  She asks if she can use "a stronger medicine to clean out bowels" and I have suggested just increasing miralax to daily for next few days.  Assessment/Plan:  1. 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 Recurrent disease found by increasing CA 125 initially 08-2014 and also by CT . Tolerating doxil well and clinically seems better, note response of CA 125 sometimes slower with doxil. Seems reasonable to give cycle 4 doxil prior to restaging scans. Recheck echocardiogram, no symptoms now. NEEDS ICE DURING TREATMENT. Note timing of gyn oncology follow up above. I will see her back 2-3 weeks after upcoming treatment with labs then. 2.Referral made for genetics counseling given this diagnosis 3 obesity: BMI 42.9. 4.post left total knee replacement for degenerative arthritis.  5.Anemia: iron deficiency and chemo. Hgb better at 11.8 today. Iron studies low 11-07-14. Continue po iron for now. 6.PAC in 7.neuropathy symptoms minimal in right toes now, from taxol 8. Environmental allergies: symptomatic rx prn. History of benign positional vertigo. 9.does not have Advance Directives per EMR 10.flu vaccine given today 11.pruritic rash LE seems more likely environmental than doxil.. OK to continue hydrocortisone topical.  12.constipation improved. 13.morbid obesity: healthy  diet discussed  Time given for all questions, which were elicited and answered.  Time spent 30 min including >50% counseling and coordination of care. Doxil orders placed. They know that they can call prior to next scheduled visit if needed.  Adie Vilar P, MD   01/05/2015, 2:08 PM

## 2015-01-05 NOTE — Patient Instructions (Signed)

## 2015-01-05 NOTE — Telephone Encounter (Signed)
pt ld back to state couldnt keep 10/4 appt-r/s to 10/31-gave pt r/s time & date

## 2015-01-07 ENCOUNTER — Inpatient Hospital Stay (HOSPITAL_COMMUNITY): Admission: RE | Admit: 2015-01-07 | Payer: Medicare Other | Source: Ambulatory Visit

## 2015-01-07 ENCOUNTER — Ambulatory Visit (HOSPITAL_COMMUNITY)
Admission: RE | Admit: 2015-01-07 | Discharge: 2015-01-07 | Disposition: A | Discharge status: Home / Self Care | Payer: Medicare Other | Source: Ambulatory Visit | Attending: Oncology | Admitting: Oncology

## 2015-01-07 DIAGNOSIS — E785 Hyperlipidemia, unspecified: Secondary | ICD-10-CM | POA: Insufficient documentation

## 2015-01-07 DIAGNOSIS — I340 Nonrheumatic mitral (valve) insufficiency: Secondary | ICD-10-CM | POA: Insufficient documentation

## 2015-01-07 DIAGNOSIS — I10 Essential (primary) hypertension: Secondary | ICD-10-CM | POA: Diagnosis not present

## 2015-01-07 DIAGNOSIS — Z79899 Other long term (current) drug therapy: Secondary | ICD-10-CM | POA: Insufficient documentation

## 2015-01-07 DIAGNOSIS — C569 Malignant neoplasm of unspecified ovary: Secondary | ICD-10-CM | POA: Insufficient documentation

## 2015-01-07 NOTE — Progress Notes (Signed)
  Echocardiogram 2D Echocardiogram has been performed.  Melissa Ball 01/07/2015, 1:50 PM

## 2015-01-09 ENCOUNTER — Telehealth: Payer: Self-pay

## 2015-01-09 ENCOUNTER — Ambulatory Visit (HOSPITAL_BASED_OUTPATIENT_CLINIC_OR_DEPARTMENT_OTHER): Payer: Medicare Other

## 2015-01-09 VITALS — BP 114/75 | HR 80 | Temp 97.8°F | Resp 18

## 2015-01-09 DIAGNOSIS — C569 Malignant neoplasm of unspecified ovary: Secondary | ICD-10-CM | POA: Diagnosis not present

## 2015-01-09 DIAGNOSIS — Z5111 Encounter for antineoplastic chemotherapy: Secondary | ICD-10-CM

## 2015-01-09 MED ORDER — HEPARIN SOD (PORK) LOCK FLUSH 100 UNIT/ML IV SOLN
500.0000 [IU] | Freq: Once | INTRAVENOUS | Status: AC | PRN
  Administered 2015-01-09: 500 [IU]
  Filled 2015-01-09: qty 5

## 2015-01-09 MED ORDER — SODIUM CHLORIDE 0.9 % IJ SOLN
10.0000 mL | INTRAMUSCULAR | Status: DC | PRN
  Administered 2015-01-09: 10 mL
  Filled 2015-01-09: qty 10

## 2015-01-09 MED ORDER — SODIUM CHLORIDE 0.9 % IV SOLN
Freq: Once | INTRAVENOUS | Status: AC
  Administered 2015-01-09: 09:00:00 via INTRAVENOUS

## 2015-01-09 MED ORDER — SODIUM CHLORIDE 0.9 % IV SOLN
Freq: Once | INTRAVENOUS | Status: AC
  Administered 2015-01-09: 09:00:00 via INTRAVENOUS
  Filled 2015-01-09: qty 4

## 2015-01-09 MED ORDER — DOXORUBICIN HCL LIPOSOMAL CHEMO INJECTION 2 MG/ML
40.0000 mg/m2 | Freq: Once | INTRAVENOUS | Status: AC
  Administered 2015-01-09: 90 mg via INTRAVENOUS
  Filled 2015-01-09: qty 20

## 2015-01-09 NOTE — Telephone Encounter (Signed)
Patient Demographics     Patient Name Sex DOB SSN Address Phone    Melissa Ball, Melissa Ball Female July 20, 1950 [id number] [place] [address] [phone] (Home)      Message  Received: Norman Herrlich, MD  Baruch Merl, RN           Please let her know that heart function if fine for next doxil as planned

## 2015-01-09 NOTE — Progress Notes (Signed)
Pt unable to tolerate ice on hands and feet. Dr. Marko Plume aware.

## 2015-01-09 NOTE — Telephone Encounter (Signed)
Told Ms. Melissa Ball the results of the CA-125 and Echocardiogram as noted below by Dr. Marko Plume.  Ms. Melissa Ball verbalized understanding.

## 2015-01-09 NOTE — Telephone Encounter (Signed)
-----   Message from Gordy Levan, MD sent at 01/06/2015 12:14 PM EDT ----- Labs seen and need follow up: please let her know CA 125 is about the same. Tell her that sometimes doxil takes longer than other chemo to improve the marker, and as she is tolerating doxil well and feeling well, it is still ok to do the treatment as planned. We will do CT as planned after this cycle.  thanks

## 2015-01-09 NOTE — Patient Instructions (Signed)
[facility] Discharge Instructions for Patients Receiving Chemotherapy  Today you received the following chemotherapy agents doxil  To help prevent nausea and vomiting after your treatment, we encourage you to take your nausea medication as directed. If you develop nausea and vomiting that is not controlled by your nausea medication, call the clinic.   BELOW ARE SYMPTOMS THAT SHOULD BE REPORTED IMMEDIATELY:  *FEVER GREATER THAN 100.5 F  *CHILLS WITH OR WITHOUT FEVER  NAUSEA AND VOMITING THAT IS NOT CONTROLLED WITH YOUR NAUSEA MEDICATION  *UNUSUAL SHORTNESS OF BREATH  *UNUSUAL BRUISING OR BLEEDING  TENDERNESS IN MOUTH AND THROAT WITH OR WITHOUT PRESENCE OF ULCERS  *URINARY PROBLEMS  *BOWEL PROBLEMS  UNUSUAL RASH Items with * indicate a potential emergency and should be followed up as soon as possible.  Feel free to call the clinic you have any questions or concerns. The clinic phone number is [phone].  

## 2015-01-15 ENCOUNTER — Encounter: Payer: Self-pay | Admitting: Genetic Counselor

## 2015-01-15 ENCOUNTER — Ambulatory Visit (HOSPITAL_BASED_OUTPATIENT_CLINIC_OR_DEPARTMENT_OTHER): Payer: Medicare Other | Admitting: Genetic Counselor

## 2015-01-15 DIAGNOSIS — C562 Malignant neoplasm of left ovary: Secondary | ICD-10-CM | POA: Diagnosis not present

## 2015-01-15 DIAGNOSIS — Z801 Family history of malignant neoplasm of trachea, bronchus and lung: Secondary | ICD-10-CM

## 2015-01-15 DIAGNOSIS — Z803 Family history of malignant neoplasm of breast: Secondary | ICD-10-CM | POA: Diagnosis not present

## 2015-01-15 DIAGNOSIS — Z800 Family history of malignant neoplasm of digestive organs: Secondary | ICD-10-CM | POA: Diagnosis not present

## 2015-01-15 DIAGNOSIS — Z809 Family history of malignant neoplasm, unspecified: Secondary | ICD-10-CM

## 2015-01-15 DIAGNOSIS — Z808 Family history of malignant neoplasm of other organs or systems: Secondary | ICD-10-CM

## 2015-01-15 NOTE — Progress Notes (Signed)
REFERRING PROVIDER: Evlyn Clines, MD  PRIMARY PROVIDER:  Cathlean Cower, MD  PRIMARY REASON FOR VISIT:  1. Ovarian cancer, left ([place])   2. Family history of breast cancer in sister   28. Family history of colon cancer in mother   92. Family history of lung cancer   5. Family history of thyroid cancer   6. Family history of cancer      HISTORY OF PRESENT ILLNESS:   Ms. Vacca, a 64 y.o. female, was seen for a [place] cancer genetics consultation at the request of Dr. Marko Plume due to a personal history of serous ovarian cancer and family history of breast, colon and other cancesr.  Ms. Nauman presents to clinic today with her niece to discuss the possibility of a hereditary predisposition to cancer, genetic testing, and to further clarify her future cancer risks, as well as potential cancer risks for family members.   In June 2015, at the age of 56, Ms. 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 This was treated with TAH-BSO and omentectomy and chemotherapy.  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 14cm adnexal mass.  Significant radiographic and chemical response to neoadjuvant platinum and taxane neoadjuvant chemotherapy x 4 cycles.  Interval cytoreduction to microscopic disease on 01/21/14. Pathology was most consistent with ovarian primary.     Endometrial cancer determined by uterine biopsy ([place]) (Resolved)   09/16/2013 Initial Diagnosis Endometrial cancer determined by uterine biopsy, and endocervical biopsy    Ovarian cancer ([address])   09/23/2013 Initial Diagnosis Ovarian cancer   10/03/2013 - 01/04/2014 Neo-Adjuvant Chemotherapy dose dense paclitaxel and carboplatin x 4 cycles   01/21/2014 Surgery robotic hysterectomy, BSO, omentectomy, interval cytoreduction to microscopic disease     HORMONAL RISK FACTORS:   Menarche was at age 80.  First live birth at age 40.  OCP use for approximately 5 years.  Ovaries intact: no.  Hysterectomy: yes.  Menopausal status: postmenopausal.  HRT use: 0 years. Colonoscopy: yes; normal in April 2007 - one hyperplastic polyp found. Mammogram within the last year: yes. Number of breast biopsies: 0. Up to date with pelvic exams: n/a Any excessive radiation exposure in the past:  no  Past Medical History  Diagnosis Date  . Hypertension   . Hyperlipidemia   . Arthritis   . GERD (gastroesophageal reflux disease)   . History of transfusion   . Ovarian cancer [facility])     Past Surgical History  Procedure Laterality Date  . Joint replacement  2012    L KNEE  . Portacath placement    . Robotic assisted total hysterectomy with bilateral salpingo oopherectomy N/A 01/21/2014    Procedure: ROBOTIC ASSISTED TOTAL HYSTERECTOMY WITH BILATERAL SALPINGO OOPHORECTOMY with omentectomy;  Surgeon: Everitt Amber, MD;  Location: [facility];  Service: Gynecology;  Laterality: N/A;    Social History   Social History  . Marital Status: Divorced    Spouse Name: N/A  . Number of Children: N/A  . Years of Education: N/A   Occupational History  . Housekeeper [employer]   Social History Main Topics  . Smoking status: Current Some Day Smoker -- 20 years    Types: Cigarettes  . Smokeless tobacco: Never Used     Comment: few cigs here and there  . Alcohol Use: 0.0 oz/week    0 Glasses of wine per week  Comment: RARE  . Drug Use: No  . Sexual Activity: No   Other Topics Concern  . Not on file   Social History Narrative   Regular exercise-yes   Caffeine use-no              FAMILY HISTORY:  We obtained a detailed, 4-generation family history.  Significant diagnoses are listed below: Family History  Problem Relation Age of Onset  . Hypertension Mother   . Colon cancer Mother 40  . Hypertension Father   . Heart disease Father   . Breast cancer Sister     dx. younger  than 70; metastatic  . Alcohol abuse Brother   . Lung cancer Brother 57    smoker  . Lung cancer Brother     dx. 33s; smoker  . Multiple sclerosis Grandchild 17  . Cancer Cousin     dx. 80s; unspecified type  . Breast cancer Cousin     dx. 15s  . Cancer Cousin     (x2 1st cousins); dx. later ages; unspecified type  . Thyroid cancer Other 40  . Cancer Cousin     dx. late 33s; unspecified type    Ms. Pagnotta has one son, age 52, who has never been diagnosed with cancer.  She has two granddaughters and one grandson.  One of her granddaughters is 19 and has been diagnosed with multiple sclerosis.  Ms. Corbo has three full sisters and three full brothers.  One sister died of metastatic breast cancer at 26, but had probably had this cancer diagnosis for awhile.  This sister has three sons and one daughter; her daughter was diagnosed with thyroid cancer at 14.  Ms. Mccully other two sisters are both in their 70s and have never had cancer.  Two brothers died of lung cancer (both smokers) in their early 31s-late 70s.  One other brother is cancer-free at 20.  No other nieces or nephews have had cancer to Ms. Beining's knowledge.  Ms. Mcconathy mother was diagnosed with colon cancer and passed away at 24.  She had one full sister who died at a "younger" age of a brain hemorrhage.  She had one son who died in his 53s and who never had cancer.  She also had one full brother who died at 90 and never had cancer.  This maternal uncle had two sons and three daughters--one daughter was diagnosed with an unspecified type of cancer in her late 49s.  Ms. Gugel has limited information for her maternal grandparents, but reports that her maternal grandfather died at the age of 48.  He had a nephew who was diagnosed with an unspecified cancer in his 99s.    Ms. Holden father died of a heart attack in his 40s.  He had one full brother and six full sisters.  Ms. Dingley is unaware of her paternal aunts/uncles having a  history of cancer.  She reports that most of them passed away later in life.  One aunt had six daughters and one son.  One daughter had breast cancer in her 68s; two more daughters had an unspecified type of cancer at later ages.  Her paternal grandparents passed away in their 60s, but she has no further information for them.  Patient's maternal ancestors are of [place] and Native [place] - [place] and [place] descent and paternal ancestors are of [place] American, Caucasian, and Native American descent. There is no reported Ashkenazi Jewish ancestry. There is no known consanguinity.  GENETIC COUNSELING  ASSESSMENT: LARRISSA STIVERS is a 64 y.o. female with a personal and family history of cancer which is somewhat suggestive of a hereditary cancer syndrome and predisposition to cancer. We, therefore, discussed and recommended the following at today's visit.   DISCUSSION: We reviewed the characteristics, features and inheritance patterns of hereditary cancer syndromes, particularly those caused by mutations within the BRCA1/2 and Lynch syndrome genes. We also discussed genetic testing, including the appropriate family members to test, the process of testing, insurance coverage and turn-around-time for results. We discussed the implications of a negative, positive and/or variant of uncertain significant result. We recommended [name] pursue genetic testing for the 20-gene Breast/Ovarian Cancer Panel through [facility] [employer] [facility] Hope Pigeon, MD).  The Breast/Ovarian Cancer Panel offered by [facility] includes sequencing and deletion/duplication analysis for the following 19 genes:  ATM, BARD1, BRCA1, BRCA2, BRIP1, CDH1, CHEK2, FANCC, MLH1, MSH2, MSH6, NBN, PALB2, PMS2, PTEN, RAD51C, RAD51D, TP53, and XRCC2.  This panel also includes deletion/duplication analysis (without sequencing) for one gene, EPCAM.  Based on Ms. Hellickson's personal history of cancer, she meets medical criteria for genetic  testing. Despite that she meets criteria, she may still have an out of pocket cost. We discussed that if her out of pocket cost for testing is over $100, the laboratory will call and confirm whether she wants to proceed with testing.  If the out of pocket cost of testing is less than $100 she will be billed by the genetic testing laboratory.   PLAN: Despite our recommendation, Ms. Calame did not wish to pursue genetic testing at today's visit. She would like to think about this more before making a decision, and, if she decides to proceed with testing, would like to coordinate the blood draw for a time when she will also be at [facility] for other appointments/bloodwork.  We understand this decision, and remain available to coordinate genetic testing at any time in the future. We; therefore, recommend Ms. Thebeau continue to follow the cancer screening guidelines given by her primary healthcare and oncology providers.  Thank you for the referral and allowing [place] to share in the care of your patient.   Jeanine Luz, MS Genetic Counselor [email or web address] Phone: [phone]  The patient was seen for a total of 60 minutes in face-to-face genetic counseling.  This patient was discussed with Drs. Magrinat, Lindi Adie and/or Burr Medico who agrees with the above.    _______________________________________________________________________ For Office Staff:  Number of people involved in session: 2 Was an Intern/ student involved with case: no

## 2015-01-26 ENCOUNTER — Encounter: Payer: Medicare Other | Admitting: Genetic Counselor

## 2015-01-26 ENCOUNTER — Ambulatory Visit: Payer: Medicare Other | Admitting: Oncology

## 2015-01-26 ENCOUNTER — Other Ambulatory Visit: Payer: Medicare Other

## 2015-01-30 ENCOUNTER — Other Ambulatory Visit: Payer: Self-pay

## 2015-01-30 DIAGNOSIS — C569 Malignant neoplasm of unspecified ovary: Secondary | ICD-10-CM

## 2015-02-01 ENCOUNTER — Other Ambulatory Visit: Payer: Self-pay | Admitting: Oncology

## 2015-02-02 ENCOUNTER — Encounter: Payer: Self-pay | Admitting: Oncology

## 2015-02-02 ENCOUNTER — Ambulatory Visit (HOSPITAL_BASED_OUTPATIENT_CLINIC_OR_DEPARTMENT_OTHER): Payer: Medicare Other | Admitting: Oncology

## 2015-02-02 ENCOUNTER — Ambulatory Visit (HOSPITAL_BASED_OUTPATIENT_CLINIC_OR_DEPARTMENT_OTHER): Payer: Medicare Other

## 2015-02-02 ENCOUNTER — Other Ambulatory Visit (HOSPITAL_BASED_OUTPATIENT_CLINIC_OR_DEPARTMENT_OTHER): Payer: Medicare Other

## 2015-02-02 VITALS — BP 151/90 | HR 86 | Temp 97.6°F | Resp 18 | Ht 64.0 in | Wt 240.9 lb

## 2015-02-02 DIAGNOSIS — C569 Malignant neoplasm of unspecified ovary: Secondary | ICD-10-CM | POA: Diagnosis not present

## 2015-02-02 DIAGNOSIS — T451X5A Adverse effect of antineoplastic and immunosuppressive drugs, initial encounter: Secondary | ICD-10-CM

## 2015-02-02 DIAGNOSIS — L271 Localized skin eruption due to drugs and medicaments taken internally: Secondary | ICD-10-CM

## 2015-02-02 DIAGNOSIS — R208 Other disturbances of skin sensation: Secondary | ICD-10-CM

## 2015-02-02 DIAGNOSIS — D509 Iron deficiency anemia, unspecified: Secondary | ICD-10-CM | POA: Diagnosis not present

## 2015-02-02 DIAGNOSIS — C562 Malignant neoplasm of left ovary: Secondary | ICD-10-CM | POA: Diagnosis not present

## 2015-02-02 DIAGNOSIS — D6481 Anemia due to antineoplastic chemotherapy: Secondary | ICD-10-CM

## 2015-02-02 DIAGNOSIS — Z95828 Presence of other vascular implants and grafts: Secondary | ICD-10-CM

## 2015-02-02 DIAGNOSIS — G620 Drug-induced polyneuropathy: Secondary | ICD-10-CM

## 2015-02-02 DIAGNOSIS — K5900 Constipation, unspecified: Secondary | ICD-10-CM

## 2015-02-02 LAB — COMPREHENSIVE METABOLIC PANEL (CC13)
ALT: 13 U/L (ref 0–55)
AST: 20 U/L (ref 5–34)
Albumin: 3.4 g/dL — ABNORMAL LOW (ref 3.5–5.0)
Alkaline Phosphatase: 102 U/L (ref 40–150)
Anion Gap: 9 mEq/L (ref 3–11)
BUN: 13.2 mg/dL (ref 7.0–26.0)
CALCIUM: 10.1 mg/dL (ref 8.4–10.4)
CHLORIDE: 110 meq/L — AB (ref 98–109)
CO2: 23 mEq/L (ref 22–29)
Creatinine: 0.8 mg/dL (ref 0.6–1.1)
EGFR: 88 mL/min/{1.73_m2} — AB (ref >90)
Glucose: 85 mg/dl (ref 70–140)
POTASSIUM: 3.7 meq/L (ref 3.5–5.1)
Sodium: 141 mEq/L (ref 136–145)
Total Bilirubin: <0.3 mg/dL (ref 0.20–1.20)
Total Protein: 7.1 g/dL (ref 6.4–8.3)

## 2015-02-02 LAB — CBC WITH DIFFERENTIAL/PLATELET
BASO%: 0.6 % (ref 0.0–2.0)
BASOS ABS: 0 10*3/uL (ref 0.0–0.1)
EOS%: 0.7 % (ref 0.0–7.0)
Eosinophils Absolute: 0 10*3/uL (ref 0.0–0.5)
HEMATOCRIT: 35 % (ref 34.8–46.6)
HGB: 11 g/dL — ABNORMAL LOW (ref 11.6–15.9)
LYMPH#: 1.6 10*3/uL (ref 0.9–3.3)
LYMPH%: 34 % (ref 14.0–49.7)
MCH: 26.1 pg (ref 25.1–34.0)
MCHC: 31.4 g/dL — AB (ref 31.5–36.0)
MCV: 83 fL (ref 79.5–101.0)
MONO#: 0.6 10*3/uL (ref 0.1–0.9)
MONO%: 12.6 % (ref 0.0–14.0)
NEUT#: 2.5 10*3/uL (ref 1.5–6.5)
NEUT%: 52.1 % (ref 38.4–76.8)
Platelets: 253 10*3/uL (ref 145–400)
RBC: 4.22 10*6/uL (ref 3.70–5.45)
RDW: 18.2 % — ABNORMAL HIGH (ref 11.2–14.5)
WBC: 4.7 10*3/uL (ref 3.9–10.3)

## 2015-02-02 LAB — CA 125: CA 125: 299 U/mL — AB (ref <35)

## 2015-02-02 MED ORDER — SODIUM CHLORIDE 0.9 % IJ SOLN
10.0000 mL | INTRAMUSCULAR | Status: DC | PRN
  Administered 2015-02-02: 10 mL via INTRAVENOUS
  Filled 2015-02-02: qty 10

## 2015-02-02 MED ORDER — HEPARIN SOD (PORK) LOCK FLUSH 100 UNIT/ML IV SOLN
500.0000 [IU] | Freq: Once | INTRAVENOUS | Status: AC
  Administered 2015-02-02: 500 [IU] via INTRAVENOUS
  Filled 2015-02-02: qty 5

## 2015-02-02 NOTE — Patient Instructions (Signed)

## 2015-02-02 NOTE — Progress Notes (Signed)
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 She had cycle 4 doxil on 01-09-15 and is for CT AP 11-9 prior to seeing Dr Denman George on 11-11.   Echocardiogram on 01-07-15, prior to cycle 4 doxil, had EF 55-60%. Patient seems to have tolerated cycle 4 doxil well overall. She used ice on hands and feet during doxil administration, with less discomfort subsequently. She denies nausea, has been eating and bowels have been moving adequately. She is not complaining of abdominal or pelvic pain or SOB. She enjoyed visit from granddaughter this weekend "did lots of cooking", granddaughter high school senior wants to attend [employer].    PAC in Genetics counseling 01-15-15. She agrees today to have blood testing with next labs done at [facility]. CA 125 was 1406 in 10-2013 and was 151 as baseline for doxil on 10-10-14 Flu vaccine  01-05-15 Echocardiogram 01-07-15 EF 55-60%.  ONCOLOGIC HISTORY Patient had acute abdominal pain Dec 2014, which resolved without medical attention, then vague diffuse abdominal discomfort, low grade nausea and abdominal bloating. She was seen in June 2015 for first gyn exam in years, PAP with AGUS and follow up biopsies of endocervix and endometrium by Dr Pamala Hurry both with serous apparent endometrial cancer (pathology from [facility] 09-12-13, case # [id number] to be scanned into this EMR). Biopsy of cervix had normal squamous epithelium. CA125 from [facility] 09-16-13 was 964, and was up to 1406 by 10-23-13.Marland Kitchen She was seen by Dr Denman George on 09-23-13, her exam remarkable for large pelvic mass minimally mobile and extending to umbilicus. She had CT CAP 09-26-13 had prominent but not enlarged juxtapericardiac lymph  nodes, trace right pleural effusion, large amount of abnormal soft tissue thruout peritoneal cavity, predominately with omentum and especially LUQ, largest area 3.8 x 3.0 x 4.0 cm anterior to proximal descending colon, implants adjacent to liver, large complex multicystic lesion in pelvis inseperable from uterus and adnexae, with mass effect on bladder, questionable area in central aspect of dome of liver, borderline enlarged retroperitoneal nodes. Due to extent of disease, treatment began with chemotherapy. Cycle one taxol carboplatin was given on 11-10-79, complicated by severe nausea/vomiting, constipation and severe taxol aches. Cycle 2 was changed to dose dense carbo taxol, day 1 on 11-01-13. She needed gCSF support by cycle 3. Neoadjuvant chemo was one cycle carbo taxol standard dose and 3 cycles dose dense. 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 She resumed chemo with cycle 4 on 02-21-14 and completed cycle 6 on 04-25-14. She had unremarkable exam by Dr Denman George 08-11-14, however increase in CA 125 marker then, from 8 in 05-2014 to 26 in 08-2014, and repeat 63 on 09-08-14. CT AP 09-16-14 shows progressive peritoneal metastases, increasing retroperitoneal and transverse mesocolon nodes and soft tissue area at vaginal cuff stable to slightly decreased. She began doxil on 10-10-14, CA 125 151 that day as baseline for doxil.     Review of systems as above, also: No fever or symptoms of infection. No bleeding. No problems with PAC. Hyperpigmented skin areas LE no longer pruritic, no skin irritiation otherwise. Remainder of 10 point Review of Systems negative.  Objective:  Vital  signs in last 24 hours:  BP 151/90 mmHg  Pulse 86  Temp(Src) 97.6 F (36.4 C) (Oral)  Resp 18  Ht 5\' 4"  (1.626 m)  Wt 240 lb 14.4 oz (109.272 kg)  BMI 41.33 kg/m2  SpO2 100% Weight down 1 lb Alert, oriented  and appropriate. Ambulatory without difficulty.  No alopecia  HEENT:PERRL, sclerae not icteric. Oral mucosa moist without lesions, posterior pharynx clear.  Neck supple. No JVD.  Lymphatics:no peripheral adenopathy appreciated Resp: clear to auscultation bilaterally and normal percussion bilaterally Cardio: regular rate and rhythm. No gallop. GI: abdomen obese, soft, nontender, not distended, cannot appreciate mass or organomegaly. Normally active bowel sounds. Surgical incision not remarkable. Musculoskeletal/ Extremities: without pitting edema, cords, tenderness Neuro: no significant peripheral neuropathy. Otherwise nonfocal. PSYCH very pleasant but not as talkative as in past.  Skin without rash, ecchymosis, petechiae. Scattered hyperpigmented areas up to 5 mm on bilateral LE resolving. Portacath-without erythema or tenderness  Lab Results:  Results for orders placed or performed in visit on 02/02/15  CBC with Differential  Result Value Ref Range   WBC 4.7 3.9 - 10.3 10e3/uL   NEUT# 2.5 1.5 - 6.5 10e3/uL   HGB 11.0 (L) 11.6 - 15.9 g/dL   HCT 35.0 34.8 - 46.6 %   Platelets 253 145 - 400 10e3/uL   MCV 83.0 79.5 - 101.0 fL   MCH 26.1 25.1 - 34.0 pg   MCHC 31.4 (L) 31.5 - 36.0 g/dL   RBC 4.22 3.70 - 5.45 10e6/uL   RDW 18.2 (H) 11.2 - 14.5 %   lymph# 1.6 0.9 - 3.3 10e3/uL   MONO# 0.6 0.1 - 0.9 10e3/uL   Eosinophils Absolute 0.0 0.0 - 0.5 10e3/uL   Basophils Absolute 0.0 0.0 - 0.1 10e3/uL   NEUT% 52.1 38.4 - 76.8 %   LYMPH% 34.0 14.0 - 49.7 %   MONO% 12.6 0.0 - 14.0 %   EOS% 0.7 0.0 - 7.0 %   BASO% 0.6 0.0 - 2.0 %   CMET available after visit normal with exception of chloride and albumin of 3.4 CA 125 available after visit higher at 299, this having been 192 prior to cycle 4.  Studies/Results:  No results found.  Medications: I have reviewed the patient's current medications.  DISCUSSION: Even prior to CA 125 resulting, with patient's next doxil due just prior to planned  restaging scans and gyn oncology follow up we had agreed to wail on this treatment until rest of restaging information available.  May want to consider addition of avastin, or change to gemzar or oral etoposide: have NOT discussed these with patient now. She is still very functional and quality of life still good.  Tried to identify her reservations re genetics testing, which she is not really able to specify; niece is in favor of the testing. After discussion today, patient is willing to have testing sent with next blood draw done here.     Assessment/Plan: 1. 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 Recurrent disease found by increasing CA 125 initially 08-2014 and also by CT . Tolerating doxil well and clinically not worse, marker higher. For restaging now after 4 cycles doxil. Considerations for further treatment as above depending on restaging. 2.Patient now agrees to genetics testing, which I will ask genetics counselors to order with next blood draw scheduled. 3 PAC in 4.palmar plantar dysesthesia better with topical ice during  doxil 5.Anemia: iron deficiency and chemo. Hgb slightly lower at 11 today. Iron studies low 11-07-14. Continue po iron for now. 6.neuropathy symptoms minimal in right toes now, from taxol 7.does not have Advance Directives per EMR 8.flu vaccine 01-05-15 9.pruritic rash LE seems more likely environmental than doxil, improving 10.constipation improved. 11.morbid obesity, BMI 41.4 12.EF still good by echocardiogram prior to #4 doxil   Questions elicited from patient and family member. Time spent 25 min including >50% counseling and coordination of care.    LIVESAY,LENNIS P, MD   02/02/2015, 9:54 AM

## 2015-02-04 ENCOUNTER — Telehealth: Payer: Self-pay | Admitting: Genetic Counselor

## 2015-02-04 ENCOUNTER — Other Ambulatory Visit: Payer: Self-pay | Admitting: Oncology

## 2015-02-04 ENCOUNTER — Telehealth: Payer: Self-pay | Admitting: Oncology

## 2015-02-04 DIAGNOSIS — D6481 Anemia due to antineoplastic chemotherapy: Secondary | ICD-10-CM | POA: Insufficient documentation

## 2015-02-04 DIAGNOSIS — T451X5A Adverse effect of antineoplastic and immunosuppressive drugs, initial encounter: Secondary | ICD-10-CM | POA: Insufficient documentation

## 2015-02-04 NOTE — Telephone Encounter (Signed)
Called and left a message with 11/21 appointments

## 2015-02-04 NOTE — Telephone Encounter (Signed)
Returning call in regards to scheduling blood work.  She says she typically comes in on Friday mornings for bloodwork.  She will be leaving for [facility] for Thanksgiving on November 22nd and she has an MRI on November 11th.  I will discuss further with Dr. Marko Plume and let her know when her labs are scheduled.

## 2015-02-05 ENCOUNTER — Telehealth: Payer: Self-pay | Admitting: Genetic Counselor

## 2015-02-05 NOTE — Telephone Encounter (Signed)
Left Ms. Fanguy a message that her labwork was scheduled for Monday, 11/21 at 2:45 PM, that she should arrive early because I will have her sign some paperwork beforehand.  She can call my office at [phone] with any questions.

## 2015-02-11 ENCOUNTER — Ambulatory Visit (HOSPITAL_COMMUNITY)
Admission: RE | Admit: 2015-02-11 | Discharge: 2015-02-11 | Disposition: A | Discharge status: Home / Self Care | Payer: Medicare Other | Source: Ambulatory Visit | Attending: Oncology | Admitting: Oncology

## 2015-02-11 ENCOUNTER — Telehealth: Payer: Self-pay | Admitting: Oncology

## 2015-02-11 ENCOUNTER — Encounter (HOSPITAL_COMMUNITY): Payer: Self-pay

## 2015-02-11 DIAGNOSIS — C569 Malignant neoplasm of unspecified ovary: Secondary | ICD-10-CM | POA: Insufficient documentation

## 2015-02-11 DIAGNOSIS — R188 Other ascites: Secondary | ICD-10-CM | POA: Diagnosis not present

## 2015-02-11 DIAGNOSIS — Z9221 Personal history of antineoplastic chemotherapy: Secondary | ICD-10-CM | POA: Diagnosis present

## 2015-02-11 DIAGNOSIS — R590 Localized enlarged lymph nodes: Secondary | ICD-10-CM | POA: Insufficient documentation

## 2015-02-11 DIAGNOSIS — E279 Disorder of adrenal gland, unspecified: Secondary | ICD-10-CM | POA: Diagnosis not present

## 2015-02-11 MED ORDER — IOHEXOL 300 MG/ML  SOLN
100.0000 mL | Freq: Once | INTRAMUSCULAR | Status: AC | PRN
  Administered 2015-02-11: 100 mL via INTRAVENOUS

## 2015-02-11 NOTE — Telephone Encounter (Signed)
pt cld wanting tro chge appt going out of town on 11/21 already purchased ticket. Gave pt updated time & date

## 2015-02-13 ENCOUNTER — Other Ambulatory Visit: Payer: Medicare Other

## 2015-02-13 ENCOUNTER — Encounter: Payer: Self-pay | Admitting: Gynecologic Oncology

## 2015-02-13 ENCOUNTER — Ambulatory Visit: Payer: Medicare Other | Attending: Gynecologic Oncology | Admitting: Gynecologic Oncology

## 2015-02-13 VITALS — BP 144/83 | HR 88 | Temp 97.5°F | Resp 20 | Ht 64.0 in | Wt 240.3 lb

## 2015-02-13 DIAGNOSIS — C569 Malignant neoplasm of unspecified ovary: Secondary | ICD-10-CM | POA: Diagnosis not present

## 2015-02-13 NOTE — Patient Instructions (Addendum)
Follow up with Dr Marko Plume as discussed with Dr Everitt Amber on Nov 28,2016  Call with any questions or concerns  Thank you

## 2015-02-13 NOTE — Progress Notes (Signed)
Ovarian Cancer Followup  Assessment:    64 y.o. 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   S/p neoadjuvant chemotherapy, robotic hysterectomy, BSO and omentectomy on 01/21/14 and 3 cycles of adjuvant chemotherapy with platinum and taxane (dose dense). S/p recurrence with Doxil between July and October 2016.  Mixed response on imaging with new sites of progression on Doxil.  Plan: 1) Genetics- recommend referral to genetic counselor for BRCA testing given ovarian cancer diagnosis and the potential utility of PARP inhibitors in 4th line for patients with BRCA mutation. The patient has canceled these scheduled appointments in the past. 2) Treatment counseling - I discussed that Melissa Ball has progression of disease on Doxil after 4 cycles. Her response was mixed with regression of the peritoneal disease, but new mesenteric and liver mets, and increase in the vaginal lesion. I am recommending changing therapy. I would consider weekly taxol with avastin.  3)  Return to see Dr Marko Plume on November 28th after Thanksgiving to discuss change in therapy.  I had a lengthly discussion with Melissa Ball and her niece about her disease and prognosis. We discussed that this is not a curable disease. We discussed that average life expectancy is between 6-36 months. I discussed that the goal of therapy is palliative, and that if toxicities of therapy become too significant (that they inhibit QOL), then we should stop therapy. I discussed that it is a reasonable option to decline salvage chemotherapy and instead receive palliative care and eventually hospice care. Melissa Ball and her niece expressed understanding of this discussion.   HPI:  Melissa Ball is a 64 y.o. year old G1 initially seen in consultation on [place] for clinical stage IV ovarian cancer.  She then underwent neoadjuvant chemotherapy with platinum and taxane therapy x 4 cycles with Dr Marko Plume between July 2nd  and October 14th. She had a significant clinical response to therapy and on 01/21/14 she underwent an interval robotic debulking including hysterectomy, BSO, omentectomy to microscopic disease without complications.  Her postoperative course was complicated by some anemia postoperatively for which she received a blood transfusion with an appropriate response.  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   She received an additional 3 cycles of carboplatin and paclitaxel chemotherapy postoperatively bleeding therapy in January 2016.  CA-125 was normalized at 10 after completing chemotherapy. CT scan of the abdomen and pelvis performed on 05/16/2014 (after completing chemotherapy) revealed no gross residual disease. They describe interval decrease in nodularity within the omentum, interval decrease in size of the small periaortic node, and interval resection of the cystic and solid pelvic masses. There were no new lesions identified.  CA 125 om February 2016 was normal at 8.  On 09/08/14 she was diagnosed with recurrence with elevated CA 125 to 151. Her CT of the abdomen and pelvis on 09/16/14 showed: 1. Moderate progression of omentalrit/peoneal metastasis. 2. Enlarging retroperitoneal and transverse mesocolonic nodes, suspicious for metastatic disease. 3. Hysterectomy and bilateral oophorectomy. Soft tissue density superior and left of the vaginal cuff for which residual disease cannot be excluded. This is slightly decreased since the prior exam and could alternatively represent postoperative scarring. 4. No evidence of bowel obstruction or other acute complication. 5. Moderate hiatal hernia.  6. Similar right worse than left adrenal nodularity. The right adrenal nodule was favored to represent an adenoma on the prior exam.  Interval Hx: She was started on  Doxil on 10/10/14 and has completed 4 cycles (on  01/06/15). She is tolerating this fairly well, though has developed some rash symptoms in the last cycle.  Her CA 125 has been declining through therapy. Her performance status is 0-1 (mostly limited by her obesity and not her cancer or chemotherapy toxicities).  CA 125 had fallen to 299 on Doxil (on 02/02/15).  She underwent CT abdo/pelvis on 02/11/15 to monitor for response to Doxil. I showed:  1. Overall mixed response to therapy as evidenced by multiple new hepatic lesions and decrease in extent of omental/peritoneal implants, without complete resolution. 2. Left vaginal cuff soft tissue appears slightly enlarged. 3. New/ enlarging retroperitoneal and transverse mesocolonic lymph nodes. 4. Ascites has largely resolved. 5. Bilateral adrenal nodules are stable from 09/26/2013.    Current Outpatient Prescriptions on File Prior to Visit  Medication Sig Dispense Refill  . lidocaine-prilocaine (EMLA) cream Apply 1 application topically See admin instructions. Apply to Porta-Cath 1-2 hours prior to access as directed.    . Multiple Vitamin (MULTIVITAMIN) capsule Take 1 capsule by mouth every other day.      No current facility-administered medications on file prior to visit.    No Known Allergies  Past Medical History  Diagnosis Date  . Hypertension   . Hyperlipidemia   . Arthritis   . GERD (gastroesophageal reflux disease)   . History of transfusion   . Ovarian cancer [facility])     Past Surgical History  Procedure Laterality Date  . Joint replacement  2012    L KNEE  . Portacath placement    . Robotic assisted total hysterectomy with bilateral salpingo oopherectomy N/A 01/21/2014    Procedure: ROBOTIC ASSISTED TOTAL HYSTERECTOMY WITH BILATERAL SALPINGO OOPHORECTOMY with omentectomy;  Surgeon: Everitt Amber, MD;  Location: [facility];  Service: Gynecology;  Laterality: N/A;    Family History  Problem Relation Age of Onset  . Hypertension Mother   . Colon cancer Mother 2  . Hypertension  Father   . Heart disease Father   . Breast cancer Sister     dx. younger than 66; metastatic  . Alcohol abuse Brother   . Lung cancer Brother 24    smoker  . Lung cancer Brother     dx. 29s; smoker  . Multiple sclerosis Grandchild 17  . Cancer Cousin     dx. 5s; unspecified type  . Breast cancer Cousin     dx. 81s  . Cancer Cousin     (x2 1st cousins); dx. later ages; unspecified type  . Thyroid cancer Other 40  . Cancer Cousin     dx. late 37s; unspecified type    Social History   Social History  . Marital Status: Divorced    Spouse Name: N/A  . Number of Children: N/A  . Years of Education: N/A   Occupational History  . Housekeeper [employer]   Social History Main Topics  . Smoking status: Current Some Day Smoker -- 20 years    Types: Cigarettes  . Smokeless tobacco: Never Used     Comment: few cigs here and there  . Alcohol Use: 0.0 oz/week    0 Glasses of wine per week     Comment: RARE  . Drug Use: No  . Sexual Activity: No   Other Topics Concern  . Not on file   Social History Narrative   Regular exercise-yes   Caffeine use-no              Review  of systems: Constitutional:  She has no weight gain or weight loss. She has no fever or chills. Eyes: No blurred vision Ears, Nose, Mouth, Throat: No dizziness, headaches or changes in hearing. No mouth sores. Cardiovascular: No chest pain, palpitations or edema. Respiratory:  No shortness of breath, wheezing or cough Gastrointestinal: She has normal bowel movements without diarrhea or constipation. She denies any nausea or vomiting. She denies blood in her stool or heart burn. Genitourinary:  She denies pelvic pain, pelvic pressure or changes in her urinary function. She has no hematuria, dysuria, or incontinence. She has no irregular vaginal bleeding or vaginal discharge Musculoskeletal: Denies muscle weakness or joint pains.  Skin:  She has no skin changes, rashes or itching Neurological:  Denies dizziness  or headaches. No neuropathy, no numbness or tingling. Psychiatric:  She denies depression or anxiety. Hematologic/Lymphatic:   No easy bruising or bleeding   Physical Exam: Blood pressure 144/83, pulse 88, temperature 97.5 F (36.4 C), temperature source Oral, resp. rate 20, height 5' 4"  (1.626 m), weight 240 lb 4.8 oz (108.999 kg), SpO2 100 %. General: Well dressed, well nourished in no apparent distress.   HEENT:  Normocephalic and atraumatic, no lesions.  Extraocular muscles intact. Sclerae anicteric. Pupils equal, round, reactive. No mouth sores or ulcers. Thyroid is normal size, not nodular, midline. Skin:  No lesions or rashes. Lungs:  Clear to auscultation bilaterally.  No wheezes. Cardiovascular:  Regular rate and rhythm.  No murmurs or rubs. Abdomen:  Soft, nontender, nondistended.  No palpable masses.  No hepatosplenomegaly.  No ascites. Normal bowel sounds.  No hernias. Incisions well healed. Genitourinary: 4cm nodule at vaginal apex. Mobile. Extremities: No cyanosis, clubbing or edema.  No calf tenderness or erythema. No palpable cords. Psychiatric: Mood and affect are appropriate. Neurological: Awake, alert and oriented x 3. Sensation is intact, no neuropathy.  Musculoskeletal: No pain, normal strength and range of motion.  30 minutes of time was spent with the patient with >50% spent in face to face counseling.  Donaciano Eva, MD

## 2015-02-17 ENCOUNTER — Encounter: Payer: Self-pay | Admitting: Internal Medicine

## 2015-02-17 ENCOUNTER — Ambulatory Visit (INDEPENDENT_AMBULATORY_CARE_PROVIDER_SITE_OTHER): Payer: Medicare Other | Admitting: Internal Medicine

## 2015-02-17 VITALS — BP 128/82 | HR 96 | Temp 97.8°F | Ht 64.0 in | Wt 237.0 lb

## 2015-02-17 DIAGNOSIS — I10 Essential (primary) hypertension: Secondary | ICD-10-CM

## 2015-02-17 DIAGNOSIS — E785 Hyperlipidemia, unspecified: Secondary | ICD-10-CM

## 2015-02-17 DIAGNOSIS — K219 Gastro-esophageal reflux disease without esophagitis: Secondary | ICD-10-CM

## 2015-02-17 NOTE — Assessment & Plan Note (Signed)
stable overall by history and exam, recent data reviewed with pt, and pt to continue medical treatment as before,  to f/u any worsening symptoms or concerns  

## 2015-02-17 NOTE — Patient Instructions (Signed)
Please continue all other medications as before, and refills have been done if requested.  Please have the pharmacy call with any other refills you may need.  Please continue your efforts at being more active, low cholesterol diet, and weight control..  Please keep your appointments with your specialists as you may have planned  Please return in 6 months, or sooner if needed, with Lab testing done 3-5 days before  

## 2015-02-17 NOTE — Assessment & Plan Note (Signed)
stable overall by history and exam, recent data reviewed with pt, and pt to continue medical treatment as before,  to f/u any worsening symptoms or concerns Lab Results  Component Value Date   LDLCALC 140* 08/14/2013   For low chol diet, hold any statin tx for now pending Cancer tx

## 2015-02-17 NOTE — Progress Notes (Signed)
Subjective:    Patient ID: Melissa Ball, female    DOB: 20-Dec-1950, 64 y.o.   MRN: [id number]  HPI  Here to f/u; overall doing ok,  Pt denies chest pain, increasing sob or doe, wheezing, orthopnea, PND, increased LE swelling, palpitations, dizziness or syncope.  Pt denies new neurological symptoms such as new headache, or facial or extremity weakness or numbness.  Pt denies polydipsia, polyuria, or low sugar episode.   Pt denies new neurological symptoms such as new headache, or facial or extremity weakness or numbness.   Pt states overall good compliance with meds, mostly trying to follow appropriate diet, with wt overall stable,  but little exercise however. S/P recent CT, to resume further CMT after thanksgiving per pt.  No current complaints.  States BP have been improving with some wt loss. Denies worsening reflux, abd pain, dysphagia, n/v, bowel change or blood. BP Readings from Last 3 Encounters:  02/17/15 140/86  02/13/15 144/83  02/02/15 151/90   Wt Readings from Last 3 Encounters:  02/17/15 237 lb (107.502 kg)  02/13/15 240 lb 4.8 oz (108.999 kg)  02/02/15 240 lb 14.4 oz (109.272 kg)   Past Medical History  Diagnosis Date  . Hypertension   . Hyperlipidemia   . Arthritis   . GERD (gastroesophageal reflux disease)   . History of transfusion   . Ovarian cancer [facility])    Past Surgical History  Procedure Laterality Date  . Joint replacement  2012    L KNEE  . Portacath placement    . Robotic assisted total hysterectomy with bilateral salpingo oopherectomy N/A 01/21/2014    Procedure: ROBOTIC ASSISTED TOTAL HYSTERECTOMY WITH BILATERAL SALPINGO OOPHORECTOMY with omentectomy;  Surgeon: Everitt Amber, MD;  Location: [facility];  Service: Gynecology;  Laterality: N/A;    reports that she has been smoking Cigarettes.  She has smoked for the past 20 years. She has never used smokeless tobacco. She reports that she drinks alcohol. She reports that she does not use illicit drugs. family  history includes Alcohol abuse in her brother; Breast cancer in her cousin and sister; Cancer in her cousin, cousin, and cousin; Colon cancer (age of onset: 67) in her mother; Heart disease in her father; Hypertension in her father and mother; Lung cancer in her brother; Lung cancer (age of onset: 55) in her brother; Multiple sclerosis (age of onset: 69) in her grandchild; Thyroid cancer (age of onset: 1) in her other. No Known Allergies Current Outpatient Prescriptions on File Prior to Visit  Medication Sig Dispense Refill  . lidocaine-prilocaine (EMLA) cream Apply 1 application topically See admin instructions. Apply to Porta-Cath 1-2 hours prior to access as directed.    . Multiple Vitamin (MULTIVITAMIN) capsule Take 1 capsule by mouth every other day.      No current facility-administered medications on file prior to visit.   Review of Systems  Constitutional: Negative for unusual diaphoresis or night sweats HENT: Negative for ringing in ear or discharge Eyes: Negative for double vision or worsening visual disturbance.  Respiratory: Negative for choking and stridor.   Gastrointestinal: Negative for vomiting or other signifcant bowel change Genitourinary: Negative for hematuria or change in urine volume.  Musculoskeletal: Negative for other MSK pain or swelling Skin: Negative for color change and worsening wound.  Neurological: Negative for tremors and numbness other than noted  Psychiatric/Behavioral: Negative for decreased concentration or agitation other than above       Objective:   Physical Exam BP 140/86 mmHg  Pulse  96  Temp(Src) 97.8 F (36.6 C) (Oral)  Ht 5\' 4"  (1.626 m)  Wt 237 lb (107.502 kg)  BMI 40.66 kg/m2  SpO2 97% VS noted,  Constitutional: Pt appears in no significant distress HENT: Head: NCAT.  Right Ear: External ear normal.  Left Ear: External ear normal.  Eyes: . Pupils are equal, round, and reactive to light. Conjunctivae and EOM are normal Neck: Normal  range of motion. Neck supple.  Cardiovascular: Normal rate and regular rhythm.   Pulmonary/Chest: Effort normal and breath sounds without rales or wheezing.  Abd:  Soft, NT, ND, + BS Neurological: Pt is alert. Not confused , motor grossly intact Skin: Skin is warm. No rash, no LE edema Psychiatric: Pt behavior is normal. No agitation.         Assessment & Plan:

## 2015-02-17 NOTE — Progress Notes (Signed)
Pre visit review using our clinic review tool, if applicable. No additional management support is needed unless otherwise documented below in the visit note. 

## 2015-02-17 NOTE — Assessment & Plan Note (Signed)
stable overall by history and exam, recent data reviewed with pt, and pt to continue medical treatment as before,  to f/u any worsening symptoms or concerns Lab Results  Component Value Date   WBC 4.7 02/02/2015   HGB 11.0* 02/02/2015   HCT 35.0 02/02/2015   PLT 253 02/02/2015   GLUCOSE 85 02/02/2015   CHOL 205* 08/14/2013   TRIG 155.0* 08/14/2013   HDL 34.10* 08/14/2013   LDLDIRECT 146.8 08/20/2012   LDLCALC 140* 08/14/2013   ALT 13 02/02/2015   AST 20 02/02/2015   NA 141 02/02/2015   K 3.7 02/02/2015   CL 104 09/08/2014   CREATININE 0.8 02/02/2015   BUN 13.2 02/02/2015   CO2 23 02/02/2015   TSH 1.170 01/22/2014   INR 0.92 12/25/2013   HGBA1C 5.8 02/21/2012

## 2015-02-23 ENCOUNTER — Other Ambulatory Visit: Payer: Medicare Other

## 2015-02-23 ENCOUNTER — Ambulatory Visit: Payer: Medicare Other | Admitting: Oncology

## 2015-03-01 ENCOUNTER — Other Ambulatory Visit: Payer: Self-pay | Admitting: Oncology

## 2015-03-02 ENCOUNTER — Other Ambulatory Visit (HOSPITAL_BASED_OUTPATIENT_CLINIC_OR_DEPARTMENT_OTHER): Payer: Medicare Other

## 2015-03-02 ENCOUNTER — Ambulatory Visit (HOSPITAL_BASED_OUTPATIENT_CLINIC_OR_DEPARTMENT_OTHER): Payer: Medicare Other | Admitting: Oncology

## 2015-03-02 ENCOUNTER — Ambulatory Visit (HOSPITAL_BASED_OUTPATIENT_CLINIC_OR_DEPARTMENT_OTHER): Payer: Medicare Other

## 2015-03-02 ENCOUNTER — Telehealth: Payer: Self-pay | Admitting: Oncology

## 2015-03-02 ENCOUNTER — Encounter: Payer: Self-pay | Admitting: Oncology

## 2015-03-02 ENCOUNTER — Ambulatory Visit: Payer: Self-pay | Admitting: Genetic Counselor

## 2015-03-02 VITALS — BP 140/79 | HR 89 | Temp 97.7°F | Resp 18 | Ht 64.0 in | Wt 238.3 lb

## 2015-03-02 DIAGNOSIS — Z95828 Presence of other vascular implants and grafts: Secondary | ICD-10-CM

## 2015-03-02 DIAGNOSIS — D509 Iron deficiency anemia, unspecified: Secondary | ICD-10-CM | POA: Diagnosis not present

## 2015-03-02 DIAGNOSIS — C787 Secondary malignant neoplasm of liver and intrahepatic bile duct: Secondary | ICD-10-CM | POA: Diagnosis not present

## 2015-03-02 DIAGNOSIS — C562 Malignant neoplasm of left ovary: Secondary | ICD-10-CM

## 2015-03-02 LAB — CBC WITH DIFFERENTIAL/PLATELET
BASO%: 0.1 % (ref 0.0–2.0)
BASOS ABS: 0 10*3/uL (ref 0.0–0.1)
EOS%: 0.7 % (ref 0.0–7.0)
Eosinophils Absolute: 0.1 10*3/uL (ref 0.0–0.5)
HCT: 36.1 % (ref 34.8–46.6)
HEMOGLOBIN: 11.6 g/dL (ref 11.6–15.9)
LYMPH%: 26.1 % (ref 14.0–49.7)
MCH: 26.5 pg (ref 25.1–34.0)
MCHC: 32.1 g/dL (ref 31.5–36.0)
MCV: 82.6 fL (ref 79.5–101.0)
MONO#: 0.7 10*3/uL (ref 0.1–0.9)
MONO%: 10 % (ref 0.0–14.0)
NEUT#: 4.6 10*3/uL (ref 1.5–6.5)
NEUT%: 63.1 % (ref 38.4–76.8)
Platelets: 217 10*3/uL (ref 145–400)
RBC: 4.37 10*6/uL (ref 3.70–5.45)
RDW: 16.3 % — AB (ref 11.2–14.5)
WBC: 7.3 10*3/uL (ref 3.9–10.3)
lymph#: 1.9 10*3/uL (ref 0.9–3.3)

## 2015-03-02 LAB — COMPREHENSIVE METABOLIC PANEL (CC13)
ALT: <9 U/L (ref 0–55)
AST: 16 U/L (ref 5–34)
Albumin: 3.5 g/dL (ref 3.5–5.0)
Alkaline Phosphatase: 116 U/L (ref 40–150)
Anion Gap: 10 mEq/L (ref 3–11)
BUN: 11.2 mg/dL (ref 7.0–26.0)
CALCIUM: 9.8 mg/dL (ref 8.4–10.4)
CHLORIDE: 106 meq/L (ref 98–109)
CO2: 24 mEq/L (ref 22–29)
Creatinine: 1 mg/dL (ref 0.6–1.1)
EGFR: 72 mL/min/{1.73_m2} — ABNORMAL LOW (ref >90)
Glucose: 80 mg/dl (ref 70–140)
POTASSIUM: 3.7 meq/L (ref 3.5–5.1)
Sodium: 140 mEq/L (ref 136–145)
Total Bilirubin: 0.35 mg/dL (ref 0.20–1.20)
Total Protein: 7.6 g/dL (ref 6.4–8.3)

## 2015-03-02 MED ORDER — HEPARIN SOD (PORK) LOCK FLUSH 100 UNIT/ML IV SOLN
500.0000 [IU] | Freq: Once | INTRAVENOUS | Status: AC
  Administered 2015-03-02: 500 [IU] via INTRAVENOUS
  Filled 2015-03-02: qty 5

## 2015-03-02 MED ORDER — SODIUM CHLORIDE 0.9 % IJ SOLN
10.0000 mL | INTRAMUSCULAR | Status: DC | PRN
  Administered 2015-03-02: 10 mL via INTRAVENOUS
  Filled 2015-03-02: qty 10

## 2015-03-02 NOTE — Patient Instructions (Signed)

## 2015-03-02 NOTE — Telephone Encounter (Signed)
Gave and printed appts ched and avs for pt for DEC  °

## 2015-03-02 NOTE — Progress Notes (Signed)
OFFICE PROGRESS NOTE   March 02, 2015   Physicians:Emma Shelda Jakes, Hunt Oris, MD , Aloha Gell  INTERVAL HISTORY:  Patient is seen, together with niece, now with mixed response to 4 cycles doxil for recurrent endometrial cancer, but multiple new liver mets and . Last doxil was 01-09-15 and CT CAP 02-11-15; she met with Dr Denman George on 02-13-15. Patient rescheduled my appointment from last week due to trip to visit family in [place].    Patient is not obviously symptomatic from the cancer. Appetite seems fine, bowels are moving regularly, no particular abdominal or pelvic discomfort. She denies SOB or chest pain, nausea, LE swelling, fever or symptoms of infection, any bleeding, any problems with PAC. Bladder ok. Does not complain of fatigue. Son concerned that she is anxious and having difficulty sleeping at night.  Remainder of 10 point Review of Systems unchanged/ negative.    PAC in, flushed today. Genetics counseling 01-15-15 and blood draw for testing done today CA 125 was 1406 in 10-2013 and was 151 as baseline for doxil on 10-10-14 Flu vaccine 01-05-15  ONCOLOGIC HISTORY Patient had acute abdominal pain Dec 2014, which resolved without medical attention, then vague diffuse abdominal discomfort, low grade nausea and abdominal bloating. She was seen in June 2015 for first gyn exam in years, PAP with AGUS and follow up biopsies of endocervix and endometrium by Dr Pamala Hurry both with serous apparent endometrial cancer (pathology from [facility] 09-12-13, case # [id number] to be scanned into this EMR). Biopsy of cervix had normal squamous epithelium. CA125 from [facility] 09-16-13 was 964, and was up to 1406 by 10-23-13.Marland Kitchen She was seen by Dr Denman George on 09-23-13, her exam remarkable for large pelvic mass minimally mobile and extending to umbilicus. She had CT CAP 09-26-13 had prominent but not enlarged juxtapericardiac lymph nodes, trace right pleural effusion, large amount of abnormal soft  tissue thruout peritoneal cavity, predominately with omentum and especially LUQ, largest area 3.8 x 3.0 x 4.0 cm anterior to proximal descending colon, implants adjacent to liver, large complex multicystic lesion in pelvis inseperable from uterus and adnexae, with mass effect on bladder, questionable area in central aspect of dome of liver, borderline enlarged retroperitoneal nodes. Due to extent of disease, treatment began with chemotherapy. Cycle one taxol carboplatin was given on 2-72-53, complicated by severe nausea/vomiting, constipation and severe taxol aches. Cycle 2 was changed to dose dense carbo taxol, day 1 on 11-01-13. She needed gCSF support by cycle 3. Neoadjuvant chemo was one cycle carbo taxol standard dose and 3 cycles dose dense. 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 She resumed chemo with cycle 4 on 02-21-14 and completed cycle 6 on 04-25-14. She had unremarkable exam by Dr Denman George 08-11-14, however increase in CA 125 marker then, from 8 in 05-2014 to 26 in 08-2014, and repeat 63 on 09-08-14. CT AP 09-16-14 shows progressive peritoneal metastases, increasing retroperitoneal and transverse mesocolon nodes and soft tissue area at vaginal cuff stable to slightly decreased. She began doxil on 10-10-14, CA 125 151 that day as baseline for doxil. She had 4 cycles of doxil thru 01-09-15, then CT 02-11-15 showed mixed response but with multiple new liver metastases. CA 125 on 02-02-15 was 299.  Objective:  Vital signs in last 24 hours:  BP 140/79 mmHg  Pulse 89  Temp(Src) 97.7 F (36.5 C) (Oral)  Resp 18  Ht 5'  4" (1.626 m)  Wt 238 lb 4.8 oz (108.092 kg)  BMI 40.88 kg/m2  SpO2 100%  Alert, oriented and appropriate. Ambulatory without assistance. Respirations not labored RA.   HEENT:PERRL, sclerae not icteric. Oral mucosa moist without lesions, posterior pharynx clear.  Neck  supple. No JVD.  Lymphatics:no supraclavicular adenopathy Resp: clear to auscultation bilaterally and normal percussion bilaterally Cardio: regular rate and rhythm. No gallop. GI: abdomen obese, soft, nontender, not distended, cannot appreciate mass or organomegaly. Some bowel sounds.  Musculoskeletal/ Extremities: without pitting edema, cords, tenderness Neuro: no significant peripheral neuropathy . Otherwise nonfocal. PSYCH more quiet and subdued, but appropriate mood and affect for situation. Skin without rash, ecchymosis, petechiae. Tongue and palms with some hyperpigmentation related to recent doxil; no skin blistering or desquamation. Resolving scattered hyperpigmented areas on lower legs. Portacath-without erythema or tenderness  Lab Results:  Results for orders placed or performed in visit on 03/02/15  CBC with Differential  Result Value Ref Range   WBC 7.3 3.9 - 10.3 10e3/uL   NEUT# 4.6 1.5 - 6.5 10e3/uL   HGB 11.6 11.6 - 15.9 g/dL   HCT 36.1 34.8 - 46.6 %   Platelets 217 145 - 400 10e3/uL   MCV 82.6 79.5 - 101.0 fL   MCH 26.5 25.1 - 34.0 pg   MCHC 32.1 31.5 - 36.0 g/dL   RBC 4.37 3.70 - 5.45 10e6/uL   RDW 16.3 (H) 11.2 - 14.5 %   lymph# 1.9 0.9 - 3.3 10e3/uL   MONO# 0.7 0.1 - 0.9 10e3/uL   Eosinophils Absolute 0.1 0.0 - 0.5 10e3/uL   Basophils Absolute 0.0 0.0 - 0.1 10e3/uL   NEUT% 63.1 38.4 - 76.8 %   LYMPH% 26.1 14.0 - 49.7 %   MONO% 10.0 0.0 - 14.0 %   EOS% 0.7 0.0 - 7.0 %   BASO% 0.1 0.0 - 2.0 %  Comprehensive metabolic panel (Cmet) - [facility]  Result Value Ref Range   Sodium 140 136 - 145 mEq/L   Potassium 3.7 3.5 - 5.1 mEq/L   Chloride 106 98 - 109 mEq/L   CO2 24 22 - 29 mEq/L   Glucose 80 70 - 140 mg/dl   BUN 11.2 7.0 - 26.0 mg/dL   Creatinine 1.0 0.6 - 1.1 mg/dL   Total Bilirubin 0.35 0.20 - 1.20 mg/dL   Alkaline Phosphatase 116 40 - 150 U/L   AST 16 5 - 34 U/L   ALT <9 0 - 55 U/L   Total Protein 7.6 6.4 - 8.3 g/dL   Albumin 3.5 3.5 - 5.0 g/dL   Calcium  9.8 8.4 - 10.4 mg/dL   Anion Gap 10 3 - 11 mEq/L   EGFR 72 (L) >90 ml/min/1.73 m2   CA 125 on 02-02-15   299  Studies/Results: CT ABDOMEN AND PELVIS WITH CONTRAST  02-11-15 TECHNIQUE: Multidetector CT imaging of the abdomen and pelvis was performed using the standard protocol following bolus administration of intravenous contrast.  CONTRAST: 153m OMNIPAQUE IOHEXOL 300 MG/ML SOLN  COMPARISON: 09/16/2014 and 09/26/2013.  FINDINGS: Lower chest: Lung bases show no acute findings. Heart size normal. No pericardial or pleural effusion. Pre pericardiac lymph nodes are sub cm in short axis size. Small to moderate hiatal hernia.  Hepatobiliary: There are several new mildly heterogeneous low-attenuation lesions in the liver, with an index 2.2 cm lesion seen posteriorly in the right hepatic lobe (series 2, image 23). Gallbladder is unremarkable. No biliary ductal dilatation.  Pancreas: Negative.  Spleen: Negative.  Adrenals/Urinary Tract:  2.2 cm right adrenal nodule and 1.9 cm left adrenal nodule are unchanged 09/26/2013, favoring adenomas. Kidneys are unremarkable. Ureters are decompressed. Bladder is unremarkable.  Stomach/Bowel: Small to moderate hiatal hernia. Stomach, small bowel and colon are unremarkable.  Vascular/Lymphatic: Scattered atherosclerotic calcification of the arterial vasculature without abdominal aortic aneurysm. Abdominal retroperitoneal lymph nodes measure up to 1.3 cm in the low left periaortic station (series 2, image 42), new. Pre-existing the left periaortic lymph node may be slightly smaller, measuring 1.3 cm (series 2, image 45), previously 1.5 cm. Ileocolic mesenteric nodules measure up to 10 mm (image 44). Left external iliac lymph nodes measure up to 1.4 cm (image 70), new.  Reproductive: Hysterectomy and bilateral finger oophorectomy. Left vaginal cuff soft tissue measures approximately 2.7 x 3.9 cm (image 66), previously 1.8 x 3.3  cm.  Other: Omental/peritoneal implants in the left abdomen have decreased in size in the interval. Index conglomerate mass anterior to the inferior spleen measures approximately 2.4 x 7.2 cm (image 26), previously 3.0 x 10.0 cm. Omental thickening along the ventral abdomen has improved in interval as well. Transverse mesocolonic nodule measures 1.4 cm (image 25), previously 9 mm. Ascites has largely resolved. There may be loculated fluid in the central anatomic pelvis (images 63 and 67).  Musculoskeletal: Peripherally sclerotic rounded lesion in the right iliac wing (image 60) is unchanged and likely a bone island. Degenerative changes are seen in the spine.  IMPRESSION: 1. Overall mixed response to therapy as evidenced by multiple new hepatic lesions and decrease in extent of omental/peritoneal implants, without complete resolution. 2. Left vaginal cuff soft tissue appears slightly enlarged. 3. New/ enlarging retroperitoneal and transverse mesocolonic lymph nodes. 4. Ascites has largely resolved. 5. Bilateral adrenal nodules are stable from 09/26/2013.   Medications: I have reviewed the patient's current medications. Ativan may be useful prn for anxiety in addition to nausea.  DISCUSSION Patient and niece tell me that their discussion with Dr Denman George was difficult, as patient "did not expect" that cancer would be worse. We have talked about trying additional treatment vs strictly palliative interventions. I have also been very clear that we do not expect treatment to cure the malignancy now, but treatment would be in attempt to shrink or control disease as best possible. They understand that additional treatment options may be available depending on results of genetic testing. She has asked about second opinion consultation in [place], which would certainly be reasonable, but seems to decide by completion of discussion that she prefers to proceed with treatment in [place] now. We  have discussed resuming weekly taxol with addition of avastin; I have discussed possible avastin complications including increased bleeding, delayed wound healing, HTN, proteinuria and bowel perforation. She has had full teaching for avastin by RN also today. They understand that this office must preauthorize treatment before this can begin.  She will let us know if she has ativan available at home from previous treatments.  Niece recorded some of our conversation to share with patient's son. Patient knows that I am glad to speak with son by phone at any time if he would like. Niece is very supportive and understands that all of the information has been very difficult for patient to accept.  Verbal consent given for treatment.  Assessment/Plan: 1. 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 Recurrent disease found by increasing CA 125 initially 08-2014 and also by CT .  Mixed response to 4 cycles of doxil thru 01-09-15, but multiple new liver mets. Plan weekly taxol days 1,8,15 every 28 days with avastin days 1 and 15  2.Genetics testing sent today 3 PAC in 4.palmar plantar dysesthesia from doxil, resolved 5.Anemia: iron deficiency and chemo. Hgb better today at 11.6 Continue po iron for now. 6.neuropathy symptoms from taxol feet>hands now essentially resolved. Will follow with resumption of that drug. 7.does not have Advance Directives per EMR 8.flu vaccine 01-05-15 9.EF good by echocardiogram prior to #4 doxil 10.morbid obesity, BMI 41.4. They have asked about diet, and I have again encouraged her to eat as healthy diet as possible.  All questions answered and they know to call if further questions or concerns prior to next visit. Taxol and avastin orders placed, [facility] managed care notified for preauthorization.  Time spent 40 min including >50% counseling and coordination of  care.    Gordy Levan, MD   03/02/2015, 5:40 PM

## 2015-03-03 ENCOUNTER — Telehealth: Payer: Self-pay | Admitting: *Deleted

## 2015-03-03 ENCOUNTER — Encounter: Payer: Self-pay | Admitting: Oncology

## 2015-03-03 ENCOUNTER — Other Ambulatory Visit: Payer: Self-pay | Admitting: Oncology

## 2015-03-03 DIAGNOSIS — C569 Malignant neoplasm of unspecified ovary: Secondary | ICD-10-CM

## 2015-03-03 DIAGNOSIS — C787 Secondary malignant neoplasm of liver and intrahepatic bile duct: Secondary | ICD-10-CM | POA: Insufficient documentation

## 2015-03-03 MED ORDER — ONDANSETRON HCL 8 MG PO TABS: 8.0000 mg | ORAL_TABLET | Freq: Three times a day (TID) | ORAL | Status: DC | PRN

## 2015-03-03 MED ORDER — DEXAMETHASONE 4 MG PO TABS: ORAL_TABLET | ORAL | Status: DC

## 2015-03-03 MED ORDER — LORAZEPAM 1 MG PO TABS: ORAL_TABLET | ORAL | Status: DC

## 2015-03-03 NOTE — Telephone Encounter (Signed)
-----   Message from Gordy Levan, MD sent at 03/02/2015  2:18 PM EST ----- Scripts please  Generics fine  Ativan 1 mg    1/2 - 1 SL or po q 6 hr prn nausea. Will make you drowsy and relaxed.  #20  zofran 8 mg    1 every 8 hrs prn nausea   #30   Decadron 4 mg   Five tablets with food 12 hrs before taxol   #15 for 3 treatments

## 2015-03-03 NOTE — Progress Notes (Signed)
[facility] END OF TREATMENT   Name: SHAKIMA MINICH Date: March 03, 2015  MRN: [id number] DOB: 1950/04/13   TREATMENT DATES: 10-10-14 thru 01-09-15   REFERRING PHYSICIAN: E.Rossi  DIAGNOSIS: recurrent endometrial cancer  STAGE AT START OF TREATMENT: IV   INTENT: control   DRUGS OR REGIMENS GIVEN: doxil  MAJOR TOXICITIES: palmar plantar syndrome   REASON TREATMENT STOPPED: progression   PERFORMANCE STATUS AT END: 1   ONGOING PROBLEMS: fatigue  FOLLOW UP PLANS: change in systemic therapy

## 2015-03-03 NOTE — Telephone Encounter (Signed)
Prescriptions sent to patient's pharmacy as noted below by Dr. Marko Plume. Notified patient that this has been done and she is agreeable to pickup medications in the next few days.

## 2015-03-04 ENCOUNTER — Telehealth: Payer: Self-pay | Admitting: *Deleted

## 2015-03-04 NOTE — Telephone Encounter (Signed)
Notified patient of planned chemo schedule as noted below by Dr. Marko Plume. Patient wrote down on her calendar the dates for the first 3 treatments and that she is "off" the fourth week and will then resume treatment again the following week. All questions answered for patient. Instructed her to call our office if she has any questions or concerns prior to her next scheduled appt - pt agreeable to this.

## 2015-03-04 NOTE — Telephone Encounter (Signed)
-----   Message from Gordy Levan, MD sent at 03/03/2015  9:19 AM EST ----- 1.Please let patient and neice know that schedule for the chemo will be:  Week 1   Short taxol + avastin Week 2   Short taxol Week 3  Short taxol + avastin Week 4   Off  Then begin again on Week 5  So 3 weeks of treatment then one week off Avastin on first week and third week each cycle  2. Please refill zofran and ativan if no refills left Decadron will be five of the 4 mg tablets with food 12 hrs before each taxol. # 15 and be sure she knows this is enough for first 3 weeks, 2 RF  Thank you

## 2015-03-04 NOTE — Progress Notes (Signed)
Ms. Melissa Ball consented for and had her blood drawn for genetic testing.  See previous note from initial genetic counseling session on January 15, 2015.  Prior to having her blood drawn we reviewed the chosen genetic test ([facility]' 20-gene Breast/Ovarian Cancer Panel) and the types of results we can get from this test: positive, negative, or uncertain/inconclusive test result (which we treat like a negative result until it gets reclassified by the lab).  Ms. Melissa Ball has my office phone number and email and she is welcome to call me with any questions she has.  The Breast/Ovarian Cancer Panel offered by [facility] Hope Pigeon, MD) includes sequencing and deletion/duplication analysis for the following 19 genes:  ATM, BARD1, BRCA1, BRCA2, BRIP1, CDH1, CHEK2, FANCC, MLH1, MSH2, MSH6, NBN, PALB2, PMS2, PTEN, RAD51C, RAD51D, TP53, and XRCC2.  This panel also includes deletion/duplication analysis (without sequencing) for one gene, EPCAM.  Results will be back in approximately 2-3 weeks, at which time I will call to discuss.  Ms. Melissa Ball voiced her understanding.

## 2015-03-09 ENCOUNTER — Other Ambulatory Visit: Payer: Self-pay | Admitting: Oncology

## 2015-03-09 DIAGNOSIS — C787 Secondary malignant neoplasm of liver and intrahepatic bile duct: Secondary | ICD-10-CM

## 2015-03-09 DIAGNOSIS — C562 Malignant neoplasm of left ovary: Secondary | ICD-10-CM

## 2015-03-10 ENCOUNTER — Telehealth: Payer: Self-pay | Admitting: Oncology

## 2015-03-10 NOTE — Telephone Encounter (Signed)
Appointments made per inbox from dr ll to open 04/07/15 and per pof she will get a new schedule 12/8

## 2015-03-11 ENCOUNTER — Telehealth: Payer: Self-pay

## 2015-03-11 NOTE — Telephone Encounter (Signed)
Reviewed times to take the Decadron premed this evening prior to her Taxol treatment at 0930 03-12-15.  Used Teach Back Method.

## 2015-03-12 ENCOUNTER — Ambulatory Visit (HOSPITAL_BASED_OUTPATIENT_CLINIC_OR_DEPARTMENT_OTHER): Payer: Medicare Other

## 2015-03-12 ENCOUNTER — Encounter: Payer: Self-pay | Admitting: Oncology

## 2015-03-12 ENCOUNTER — Ambulatory Visit: Payer: Medicare Other

## 2015-03-12 ENCOUNTER — Ambulatory Visit (HOSPITAL_BASED_OUTPATIENT_CLINIC_OR_DEPARTMENT_OTHER): Payer: Medicare Other | Admitting: Oncology

## 2015-03-12 ENCOUNTER — Other Ambulatory Visit (HOSPITAL_BASED_OUTPATIENT_CLINIC_OR_DEPARTMENT_OTHER): Payer: Medicare Other

## 2015-03-12 VITALS — BP 150/88 | HR 94

## 2015-03-12 VITALS — BP 134/85 | HR 110 | Temp 97.4°F | Resp 18 | Ht 64.0 in | Wt 235.4 lb

## 2015-03-12 DIAGNOSIS — Z5111 Encounter for antineoplastic chemotherapy: Secondary | ICD-10-CM | POA: Diagnosis not present

## 2015-03-12 DIAGNOSIS — C787 Secondary malignant neoplasm of liver and intrahepatic bile duct: Secondary | ICD-10-CM

## 2015-03-12 DIAGNOSIS — C569 Malignant neoplasm of unspecified ovary: Secondary | ICD-10-CM

## 2015-03-12 DIAGNOSIS — C562 Malignant neoplasm of left ovary: Secondary | ICD-10-CM

## 2015-03-12 DIAGNOSIS — Z95828 Presence of other vascular implants and grafts: Secondary | ICD-10-CM

## 2015-03-12 DIAGNOSIS — T451X5A Adverse effect of antineoplastic and immunosuppressive drugs, initial encounter: Secondary | ICD-10-CM

## 2015-03-12 DIAGNOSIS — D6481 Anemia due to antineoplastic chemotherapy: Secondary | ICD-10-CM | POA: Diagnosis not present

## 2015-03-12 DIAGNOSIS — Z5112 Encounter for antineoplastic immunotherapy: Secondary | ICD-10-CM

## 2015-03-12 LAB — COMPREHENSIVE METABOLIC PANEL
ANION GAP: 14 meq/L — AB (ref 3–11)
AST: 16 U/L (ref 5–34)
Albumin: 3.5 g/dL (ref 3.5–5.0)
Alkaline Phosphatase: 123 U/L (ref 40–150)
BUN: 14.2 mg/dL (ref 7.0–26.0)
CHLORIDE: 107 meq/L (ref 98–109)
CO2: 19 meq/L — AB (ref 22–29)
Calcium: 10.2 mg/dL (ref 8.4–10.4)
Creatinine: 0.9 mg/dL (ref 0.6–1.1)
EGFR: 73 mL/min/{1.73_m2} — AB (ref >90)
GLUCOSE: 133 mg/dL (ref 70–140)
Potassium: 4.1 mEq/L (ref 3.5–5.1)
SODIUM: 140 meq/L (ref 136–145)
TOTAL PROTEIN: 8.2 g/dL (ref 6.4–8.3)

## 2015-03-12 LAB — CBC WITH DIFFERENTIAL/PLATELET
BASO%: 0 % (ref 0.0–2.0)
Basophils Absolute: 0 10*3/uL (ref 0.0–0.1)
EOS%: 0 % (ref 0.0–7.0)
Eosinophils Absolute: 0 10*3/uL (ref 0.0–0.5)
HCT: 36 % (ref 34.8–46.6)
HGB: 11.7 g/dL (ref 11.6–15.9)
LYMPH%: 12.7 % — AB (ref 14.0–49.7)
MCH: 26.6 pg (ref 25.1–34.0)
MCHC: 32.5 g/dL (ref 31.5–36.0)
MCV: 81.8 fL (ref 79.5–101.0)
MONO#: 0.1 10*3/uL (ref 0.1–0.9)
MONO%: 1.5 % (ref 0.0–14.0)
NEUT%: 85.8 % — AB (ref 38.4–76.8)
NEUTROS ABS: 6.3 10*3/uL (ref 1.5–6.5)
Platelets: 251 10*3/uL (ref 145–400)
RBC: 4.4 10*6/uL (ref 3.70–5.45)
RDW: 15.6 % — ABNORMAL HIGH (ref 11.2–14.5)
WBC: 7.3 10*3/uL (ref 3.9–10.3)
lymph#: 0.9 10*3/uL (ref 0.9–3.3)

## 2015-03-12 LAB — UA PROTEIN, DIPSTICK - CHCC

## 2015-03-12 MED ORDER — SODIUM CHLORIDE 0.9 % IV SOLN
Freq: Once | INTRAVENOUS | Status: AC
  Administered 2015-03-12: 11:00:00 via INTRAVENOUS
  Filled 2015-03-12: qty 8

## 2015-03-12 MED ORDER — SODIUM CHLORIDE 0.9 % IV SOLN
Freq: Once | INTRAVENOUS | Status: AC
  Administered 2015-03-12: 10:00:00 via INTRAVENOUS

## 2015-03-12 MED ORDER — HEPARIN SOD (PORK) LOCK FLUSH 100 UNIT/ML IV SOLN
500.0000 [IU] | Freq: Once | INTRAVENOUS | Status: AC | PRN
  Administered 2015-03-12: 500 [IU]
  Filled 2015-03-12: qty 5

## 2015-03-12 MED ORDER — DIPHENHYDRAMINE HCL 50 MG/ML IJ SOLN
INTRAMUSCULAR | Status: AC
  Filled 2015-03-12: qty 1

## 2015-03-12 MED ORDER — SODIUM CHLORIDE 0.9 % IJ SOLN
10.0000 mL | INTRAMUSCULAR | Status: DC | PRN
  Administered 2015-03-12: 10 mL
  Filled 2015-03-12: qty 10

## 2015-03-12 MED ORDER — DIPHENHYDRAMINE HCL 50 MG/ML IJ SOLN
50.0000 mg | Freq: Once | INTRAMUSCULAR | Status: AC
  Administered 2015-03-12: 50 mg via INTRAVENOUS

## 2015-03-12 MED ORDER — FAMOTIDINE IN NACL 20-0.9 MG/50ML-% IV SOLN
20.0000 mg | Freq: Once | INTRAVENOUS | Status: AC
  Administered 2015-03-12: 20 mg via INTRAVENOUS

## 2015-03-12 MED ORDER — PACLITAXEL CHEMO INJECTION 300 MG/50ML
80.0000 mg/m2 | Freq: Once | INTRAVENOUS | Status: AC
  Administered 2015-03-12: 174 mg via INTRAVENOUS
  Filled 2015-03-12: qty 29

## 2015-03-12 MED ORDER — SODIUM CHLORIDE 0.9 % IJ SOLN
10.0000 mL | INTRAMUSCULAR | Status: DC | PRN
  Administered 2015-03-12: 10 mL via INTRAVENOUS
  Filled 2015-03-12: qty 10

## 2015-03-12 MED ORDER — FAMOTIDINE IN NACL 20-0.9 MG/50ML-% IV SOLN
INTRAVENOUS | Status: AC
  Filled 2015-03-12: qty 50

## 2015-03-12 MED ORDER — BEVACIZUMAB CHEMO INJECTION 400 MG/16ML
10.2000 mg/kg | Freq: Once | INTRAVENOUS | Status: AC
  Administered 2015-03-12: 1100 mg via INTRAVENOUS
  Filled 2015-03-12: qty 44

## 2015-03-12 NOTE — Progress Notes (Signed)
OFFICE PROGRESS NOTE   March 13, 2015   Physicians:Emma Shelda Jakes, Hunt Oris, MD , Aloha Gell  INTERVAL HISTORY:  Patient is seen, alone for visit, to begin weekly taxol with avastin today for progressive, metastatic left ovarian cancer. Plan is for taxol days 04-11-13 with avastin days 1 and 15 every 28 days.  Patient has felt generally well despite progression of left ovarian carcinoma, now including liver metastases by CT 02-11-15. Appetite is good, staying well hydrated, and energy at baseline, enjoyed recent trip to visit son's family for Thanksgiving and active with church choir. Bowels are moving regularly. She denies SOB, bleeding, LE swelling. She has mild abdominal bloating at times and slight mid abdominal discomfort since oral steroids premed for taxol. No fever or symptoms of infection. No problems with PAC. No new or different pain otherwise. She took premedication decadron for taxol last pm. Remainder of 10 point Review of Systems negative/ unchanged.     PAC in, flushed today. Genetics counseling 01-15-15 and blood draw for testing 03-02-15 CA 125 was 1406 in 10-2013 and 692 as baseline for taxol avastin today Flu vaccine 01-05-15 Original pathology strongly ER +  ONCOLOGIC HISTORY Patient had acute abdominal pain Dec 2014, which resolved without medical attention, then vague diffuse abdominal discomfort, low grade nausea and abdominal bloating. She was seen in June 2015 for first gyn exam in years, PAP with AGUS and follow up biopsies of endocervix and endometrium by Dr Pamala Hurry both with serous apparent endometrial cancer (pathology from [facility] 09-12-13, case # [id number] to be scanned into this EMR). Biopsy of cervix had normal squamous epithelium. CA125 from [facility] 09-16-13 was 964, and was up to 1406 by 10-23-13.Marland Kitchen She was seen by Dr Denman George on 09-23-13, her exam remarkable for large pelvic mass minimally mobile and extending to umbilicus. She had CT CAP  09-26-13 had prominent but not enlarged juxtapericardiac lymph nodes, trace right pleural effusion, large amount of abnormal soft tissue thruout peritoneal cavity, predominately with omentum and especially LUQ, largest area 3.8 x 3.0 x 4.0 cm anterior to proximal descending colon, implants adjacent to liver, large complex multicystic lesion in pelvis inseperable from uterus and adnexae, with mass effect on bladder, questionable area in central aspect of dome of liver, borderline enlarged retroperitoneal nodes. Due to extent of disease, treatment began with chemotherapy. Cycle one taxol carboplatin was given on 5-46-50, complicated by severe nausea/vomiting, constipation and severe taxol aches. Cycle 2 was changed to dose dense carbo taxol, day 1 on 11-01-13. She needed gCSF support by cycle 3. Neoadjuvant chemo was one cycle carbo taxol standard dose and 3 cycles dose dense. 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 She resumed chemo with cycle 4 on 02-21-14 and completed cycle 6 on 04-25-14. She had unremarkable exam by Dr Denman George 08-11-14, however increase in CA 125 marker then, from 8 in 05-2014 to 26 in 08-2014, and repeat 63 on 09-08-14. CT AP 09-16-14 shows progressive peritoneal metastases, increasing retroperitoneal and transverse mesocolon nodes and soft tissue area at vaginal cuff stable to slightly decreased. She began doxil on 10-10-14, CA 125 151 that day as baseline for doxil. She had 4 cycles of doxil thru 01-09-15, then CT 02-11-15 showed mixed response but with multiple new liver metastases. CA 125 on 02-02-15 was 299. Treatment with weekly taxol and every other week avastin beginning 03-12-15.   Objective:  Vital signs in last 24 hours:  BP 134/85 mmHg  Pulse 110  Temp(Src) 97.4 F (36.3 C) (Oral)  Resp 18  Ht 5' 4"  (1.626 m)  Wt 235 lb 6.4 oz (106.777 kg)  BMI 40.39 kg/m2   SpO2 100% Weight down 3 lbs. Alert, oriented and appropriate. Ambulatory without assistance.  No alopecia  HEENT:PERRL, sclerae not icteric. Oral mucosa moist without lesions, posterior pharynx clear.  Neck supple. No JVD.  Lymphatics:no supraclavicular adenopathy Resp: clear to auscultation bilaterally and normal percussion bilaterally Cardio: regular rate and rhythm. No gallop. GI: abdomen soft, nontender including epigastrium and mid abdomen, not distended, no mass or organomegaly. Normally active bowel sounds. Surgical incision not remarkable. Musculoskeletal/ Extremities: without pitting edema, cords, tenderness Neuro: no significant peripheral neuropathy. Otherwise nonfocal. Psych: affect and mood better today (note steroids last pm) Skin without rash, ecchymosis, petechiae Portacath-without erythema or tenderness  Lab Results:  Results for orders placed or performed in visit on 03/12/15  Urine protein by dipstick - [facility]  Result Value Ref Range   Protein, ur < 30 Negative- <30 mg/dL  CBC with Differential  Result Value Ref Range   WBC 7.3 3.9 - 10.3 10e3/uL   NEUT# 6.3 1.5 - 6.5 10e3/uL   HGB 11.7 11.6 - 15.9 g/dL   HCT 36.0 34.8 - 46.6 %   Platelets 251 145 - 400 10e3/uL   MCV 81.8 79.5 - 101.0 fL   MCH 26.6 25.1 - 34.0 pg   MCHC 32.5 31.5 - 36.0 g/dL   RBC 4.40 3.70 - 5.45 10e6/uL   RDW 15.6 (H) 11.2 - 14.5 %   lymph# 0.9 0.9 - 3.3 10e3/uL   MONO# 0.1 0.1 - 0.9 10e3/uL   Eosinophils Absolute 0.0 0.0 - 0.5 10e3/uL   Basophils Absolute 0.0 0.0 - 0.1 10e3/uL   NEUT% 85.8 (H) 38.4 - 76.8 %   LYMPH% 12.7 (L) 14.0 - 49.7 %   MONO% 1.5 0.0 - 14.0 %   EOS% 0.0 0.0 - 7.0 %   BASO% 0.0 0.0 - 2.0 %  Comprehensive metabolic panel  Result Value Ref Range   Sodium 140 136 - 145 mEq/L   Potassium 4.1 3.5 - 5.1 mEq/L   Chloride 107 98 - 109 mEq/L   CO2 19 (L) 22 - 29 mEq/L   Glucose 133 70 - 140 mg/dl   BUN 14.2 7.0 - 26.0 mg/dL   Creatinine 0.9 0.6 - 1.1 mg/dL   Total  Bilirubin <0.30 0.20 - 1.20 mg/dL   Alkaline Phosphatase 123 40 - 150 U/L   AST 16 5 - 34 U/L   ALT <9 0 - 55 U/L   Total Protein 8.2 6.4 - 8.3 g/dL   Albumin 3.5 3.5 - 5.0 g/dL   Calcium 10.2 8.4 - 10.4 mg/dL   Anion Gap 14 (H) 3 - 11 mEq/L   EGFR 73 (L) >90 ml/min/1.73 m2  CA 125  Result Value Ref Range   CA 125 692 (H) <35 U/mL    Studies/Results:  No results found.  Medications: I have reviewed the patient's current medications. She has zofran and ativan available.  DISCUSSION  DIscussed UA for protein prior to avastin treatments. Patient understands planned treatment schedule and is in agreement with this. She will have day 8 cycle 1 taxol on 03-19-15 as long as [place] >=1.5 and plt >=100k and chemistries meet parameters; I will see her 12-19 prior to day 15 cycle 1 taxol avastin on 03-26-15. She knows to call  prior to scheduled appointments if concerns.  Verbal consent for treatment given.   Assessment/Plan:  1. 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 Recurrent disease since 08-2014 . Mixed response to 4 cycles of doxil thru 01-09-15, but multiple new liver mets. Plan weekly taxol days 1,8,15 every 28 days with avastin days 1 and 15  2.Genetics testing sent 03-02-15, as other treatment options possible depending on BRCA status, in addition to usefulness of this information for family. 3 PAC in 4.palmar plantar dysesthesia from doxil, resolved 5.Anemia: iron deficiency and chemo. Hgb stable today at 11.7 Continue po iron . 6.neuropathy symptoms from taxol feet>hands now essentially resolved. Will follow with resumption of that drug. 7.does not have Advance Directives per EMR 8.flu vaccine 01-05-15 9.EF good by echocardiogram prior to #4 doxil 10.morbid obesity, BMI 41.4. Son is encouraging healthy diet also.  Taxol and avastin orders confirmed. All  questions answered. Time spent 25 min including >50% counseling and coordination of care.    Javante Nilsson P, MD   03/13/2015, 11:13 AM

## 2015-03-12 NOTE — Patient Instructions (Signed)
[facility] Discharge Instructions for Patients Receiving Chemotherapy  Today you received the following chemotherapy agents:  Avastin and Taxol  To help prevent nausea and vomiting after your treatment, we encourage you to take your nausea medication as ordered per MD.   If you develop nausea and vomiting that is not controlled by your nausea medication, call the clinic.   BELOW ARE SYMPTOMS THAT SHOULD BE REPORTED IMMEDIATELY:  *FEVER GREATER THAN 100.5 F  *CHILLS WITH OR WITHOUT FEVER  NAUSEA AND VOMITING THAT IS NOT CONTROLLED WITH YOUR NAUSEA MEDICATION  *UNUSUAL SHORTNESS OF BREATH  *UNUSUAL BRUISING OR BLEEDING  TENDERNESS IN MOUTH AND THROAT WITH OR WITHOUT PRESENCE OF ULCERS  *URINARY PROBLEMS  *BOWEL PROBLEMS  UNUSUAL RASH Items with * indicate a potential emergency and should be followed up as soon as possible.  Feel free to call the clinic you have any questions or concerns. The clinic phone number is [phone].  Please show the [facility] at check-in to the Emergency Department and triage nurse.  Bevacizumab injection What is this medicine? BEVACIZUMAB (be va SIZ yoo mab) is a monoclonal antibody. It is used to treat cervical cancer, colorectal cancer, glioblastoma multiforme, non-small cell lung cancer (NSCLC), ovarian cancer, and renal cell cancer. This medicine may be used for other purposes; ask your health care provider or pharmacist if you have questions. What should I tell my health care provider before I take this medicine? They need to know if you have any of these conditions: -blood clots -heart disease, including heart failure, heart attack, or chest pain (angina) -high blood pressure -infection (especially a virus infection such as chickenpox, cold sores, or herpes) -kidney disease -lung disease -prior chemotherapy with doxorubicin, daunorubicin, epirubicin, or other anthracycline type chemotherapy agents -recent or  ongoing radiation therapy -recent surgery -stroke -an unusual or allergic reaction to bevacizumab, hamster proteins, mouse proteins, other medicines, foods, dyes, or preservatives -pregnant or trying to get pregnant -breast-feeding How should I use this medicine? This medicine is for infusion into a vein. It is given by a health care professional in a hospital or clinic setting. Talk to your pediatrician regarding the use of this medicine in children. Special care may be needed. Overdosage: If you think you have taken too much of this medicine contact a poison control center or emergency room at once. NOTE: This medicine is only for you. Do not share this medicine with others. What if I miss a dose? It is important not to miss your dose. Call your doctor or health care professional if you are unable to keep an appointment. What may interact with this medicine? Interactions are not expected. This list may not describe all possible interactions. Give your health care provider a list of all the medicines, herbs, non-prescription drugs, or dietary supplements you use. Also tell them if you smoke, drink alcohol, or use illegal drugs. Some items may interact with your medicine. What should I watch for while using this medicine? Your condition will be monitored carefully while you are receiving this medicine. You will need important blood work and urine testing done while you are taking this medicine. During your treatment, let your health care professional know if you have any unusual symptoms, such as difficulty breathing. This medicine may rarely cause 'gastrointestinal perforation' (holes in the stomach, intestines or colon), a serious side effect requiring surgery to repair. This medicine should be started at least 28 days following major surgery and the site of the  surgery should be totally healed. Check with your doctor before scheduling dental work or surgery while you are receiving this  treatment. Talk to your doctor if you have recently had surgery or if you have a wound that has not healed. Do not become pregnant while taking this medicine or for 6 months after stopping it. Women should inform their doctor if they wish to become pregnant or think they might be pregnant. There is a potential for serious side effects to an unborn child. Talk to your health care professional or pharmacist for more information. Do not breast-feed an infant while taking this medicine. This medicine has caused ovarian failure in some women. This medicine may interfere with the ability to have a child. You should talk to your doctor or health care professional if you are concerned about your fertility. What side effects may I notice from receiving this medicine? Side effects that you should report to your doctor or health care professional as soon as possible: -allergic reactions like skin rash, itching or hives, swelling of the face, lips, or tongue -signs of infection - fever or chills, cough, sore throat, pain or trouble passing urine -signs of decreased platelets or bleeding - bruising, pinpoint red spots on the skin, black, tarry stools, nosebleeds, blood in the urine -breathing problems -changes in vision -chest pain -confusion -jaw pain, especially after dental work -mouth sores -seizures -severe abdominal pain -severe headache -sudden numbness or weakness of the face, arm or leg -swelling of legs or ankles -symptoms of a stroke: change in mental awareness, inability to talk or move one side of the body (especially in patients with lung cancer) -trouble passing urine or change in the amount of urine -trouble speaking or understanding -trouble walking, dizziness, loss of balance or coordination Side effects that usually do not require medical attention (report to your doctor or health care professional if they continue or are bothersome): -constipation -diarrhea -dry  skin -headache -loss of appetite -nausea, vomiting This list may not describe all possible side effects. Call your doctor for medical advice about side effects. You may report side effects to FDA at 1-800-FDA-1088. Where should I keep my medicine? This drug is given in a hospital or clinic and will not be stored at home. NOTE: This sheet is a summary. It may not cover all possible information. If you have questions about this medicine, talk to your doctor, pharmacist, or health care provider.    2016, Elsevier/Gold Standard. (2014-05-20 16:58:44)

## 2015-03-12 NOTE — Patient Instructions (Signed)

## 2015-03-13 DIAGNOSIS — C569 Malignant neoplasm of unspecified ovary: Secondary | ICD-10-CM | POA: Insufficient documentation

## 2015-03-13 LAB — CA 125: CA 125: 692 U/mL — AB (ref <35)

## 2015-03-19 ENCOUNTER — Other Ambulatory Visit (HOSPITAL_BASED_OUTPATIENT_CLINIC_OR_DEPARTMENT_OTHER): Payer: Medicare Other

## 2015-03-19 ENCOUNTER — Ambulatory Visit (HOSPITAL_BASED_OUTPATIENT_CLINIC_OR_DEPARTMENT_OTHER): Payer: Medicare Other

## 2015-03-19 VITALS — BP 141/92 | HR 106 | Temp 97.1°F | Resp 18

## 2015-03-19 DIAGNOSIS — C569 Malignant neoplasm of unspecified ovary: Secondary | ICD-10-CM

## 2015-03-19 DIAGNOSIS — Z5111 Encounter for antineoplastic chemotherapy: Secondary | ICD-10-CM | POA: Diagnosis not present

## 2015-03-19 LAB — COMPREHENSIVE METABOLIC PANEL
ALBUMIN: 3.7 g/dL (ref 3.5–5.0)
ALK PHOS: 113 U/L (ref 40–150)
ALT: 12 U/L (ref 0–55)
AST: 17 U/L (ref 5–34)
Anion Gap: 11 mEq/L (ref 3–11)
BUN: 16.1 mg/dL (ref 7.0–26.0)
CALCIUM: 10.3 mg/dL (ref 8.4–10.4)
CHLORIDE: 106 meq/L (ref 98–109)
CO2: 21 mEq/L — ABNORMAL LOW (ref 22–29)
Creatinine: 1 mg/dL (ref 0.6–1.1)
EGFR: 72 mL/min/{1.73_m2} — AB (ref >90)
Glucose: 115 mg/dl (ref 70–140)
POTASSIUM: 4.2 meq/L (ref 3.5–5.1)
Sodium: 138 mEq/L (ref 136–145)
Total Bilirubin: <0.3 mg/dL (ref 0.20–1.20)
Total Protein: 8.1 g/dL (ref 6.4–8.3)

## 2015-03-19 LAB — CBC WITH DIFFERENTIAL/PLATELET
BASO%: 0 % (ref 0.0–2.0)
Basophils Absolute: 0 10e3/uL (ref 0.0–0.1)
EOS%: 0 % (ref 0.0–7.0)
Eosinophils Absolute: 0 10e3/uL (ref 0.0–0.5)
HCT: 36.5 % (ref 34.8–46.6)
HGB: 11.8 g/dL (ref 11.6–15.9)
LYMPH%: 20.4 % (ref 14.0–49.7)
MCH: 26.5 pg (ref 25.1–34.0)
MCHC: 32.3 g/dL (ref 31.5–36.0)
MCV: 81.8 fL (ref 79.5–101.0)
MONO#: 0.1 10e3/uL (ref 0.1–0.9)
MONO%: 1.1 % (ref 0.0–14.0)
NEUT#: 3.7 10e3/uL (ref 1.5–6.5)
NEUT%: 78.5 % — ABNORMAL HIGH (ref 38.4–76.8)
Platelets: 297 10e3/uL (ref 145–400)
RBC: 4.46 10e6/uL (ref 3.70–5.45)
RDW: 15.5 % — ABNORMAL HIGH (ref 11.2–14.5)
WBC: 4.7 10e3/uL (ref 3.9–10.3)
lymph#: 1 10e3/uL (ref 0.9–3.3)

## 2015-03-19 LAB — UA PROTEIN, DIPSTICK - CHCC: Protein, ur: NEGATIVE mg/dL

## 2015-03-19 MED ORDER — SODIUM CHLORIDE 0.9 % IV SOLN
Freq: Once | INTRAVENOUS | Status: AC
  Administered 2015-03-19: 09:00:00 via INTRAVENOUS
  Filled 2015-03-19: qty 8

## 2015-03-19 MED ORDER — HEPARIN SOD (PORK) LOCK FLUSH 100 UNIT/ML IV SOLN
500.0000 [IU] | Freq: Once | INTRAVENOUS | Status: AC | PRN
  Administered 2015-03-19: 500 [IU]
  Filled 2015-03-19: qty 5

## 2015-03-19 MED ORDER — DIPHENHYDRAMINE HCL 50 MG/ML IJ SOLN
50.0000 mg | Freq: Once | INTRAMUSCULAR | Status: AC
  Administered 2015-03-19: 50 mg via INTRAVENOUS

## 2015-03-19 MED ORDER — DIPHENHYDRAMINE HCL 50 MG/ML IJ SOLN
INTRAMUSCULAR | Status: AC
  Filled 2015-03-19: qty 1

## 2015-03-19 MED ORDER — PACLITAXEL CHEMO INJECTION 300 MG/50ML
80.0000 mg/m2 | Freq: Once | INTRAVENOUS | Status: AC
  Administered 2015-03-19: 174 mg via INTRAVENOUS
  Filled 2015-03-19: qty 29

## 2015-03-19 MED ORDER — FAMOTIDINE IN NACL 20-0.9 MG/50ML-% IV SOLN
INTRAVENOUS | Status: AC
  Filled 2015-03-19: qty 50

## 2015-03-19 MED ORDER — FAMOTIDINE IN NACL 20-0.9 MG/50ML-% IV SOLN
20.0000 mg | Freq: Once | INTRAVENOUS | Status: AC
  Administered 2015-03-19: 20 mg via INTRAVENOUS

## 2015-03-19 MED ORDER — SODIUM CHLORIDE 0.9 % IJ SOLN
10.0000 mL | INTRAMUSCULAR | Status: DC | PRN
  Administered 2015-03-19: 10 mL
  Filled 2015-03-19: qty 10

## 2015-03-19 MED ORDER — SODIUM CHLORIDE 0.9 % IV SOLN
Freq: Once | INTRAVENOUS | Status: AC
  Administered 2015-03-19: 08:00:00 via INTRAVENOUS

## 2015-03-19 NOTE — Patient Instructions (Signed)
Paclitaxel injection What is this medicine? PACLITAXEL (PAK li TAX el) is a chemotherapy drug. It targets fast dividing cells, like cancer cells, and causes these cells to die. This medicine is used to treat ovarian cancer, breast cancer, and other cancers. This medicine may be used for other purposes; ask your health care provider or pharmacist if you have questions. What should I tell my health care provider before I take this medicine? They need to know if you have any of these conditions: -blood disorders -irregular heartbeat -infection (especially a virus infection such as chickenpox, cold sores, or herpes) -liver disease -previous or ongoing radiation therapy -an unusual or allergic reaction to paclitaxel, alcohol, polyoxyethylated castor oil, other chemotherapy agents, other medicines, foods, dyes, or preservatives -pregnant or trying to get pregnant -breast-feeding How should I use this medicine? This drug is given as an infusion into a vein. It is administered in a hospital or clinic by a specially trained health care professional. Talk to your pediatrician regarding the use of this medicine in children. Special care may be needed. Overdosage: If you think you have taken too much of this medicine contact a poison control center or emergency room at once. NOTE: This medicine is only for you. Do not share this medicine with others. What if I miss a dose? It is important not to miss your dose. Call your doctor or health care professional if you are unable to keep an appointment. What may interact with this medicine? Do not take this medicine with any of the following medications: -disulfiram -metronidazole This medicine may also interact with the following medications: -cyclosporine -diazepam -ketoconazole -medicines to increase blood counts like filgrastim, pegfilgrastim, sargramostim -other chemotherapy drugs like cisplatin, doxorubicin, epirubicin, etoposide, teniposide,  vincristine -quinidine -testosterone -vaccines -verapamil Talk to your doctor or health care professional before taking any of these medicines: -acetaminophen -aspirin -ibuprofen -ketoprofen -naproxen This list may not describe all possible interactions. Give your health care provider a list of all the medicines, herbs, non-prescription drugs, or dietary supplements you use. Also tell them if you smoke, drink alcohol, or use illegal drugs. Some items may interact with your medicine. What should I watch for while using this medicine? Your condition will be monitored carefully while you are receiving this medicine. You will need important blood work done while you are taking this medicine. This drug may make you feel generally unwell. This is not uncommon, as chemotherapy can affect healthy cells as well as cancer cells. Report any side effects. Continue your course of treatment even though you feel ill unless your doctor tells you to stop. This medicine can cause serious allergic reactions. To reduce your risk you will need to take other medicine(s) before treatment with this medicine. In some cases, you may be given additional medicines to help with side effects. Follow all directions for their use. Call your doctor or health care professional for advice if you get a fever, chills or sore throat, or other symptoms of a cold or flu. Do not treat yourself. This drug decreases your body's ability to fight infections. Try to avoid being around people who are sick. This medicine may increase your risk to bruise or bleed. Call your doctor or health care professional if you notice any unusual bleeding. Be careful brushing and flossing your teeth or using a toothpick because you may get an infection or bleed more easily. If you have any dental work done, tell your dentist you are receiving this medicine. Avoid taking products that   contain aspirin, acetaminophen, ibuprofen, naproxen, or ketoprofen unless  instructed by your doctor. These medicines may hide a fever. Do not become pregnant while taking this medicine. Women should inform their doctor if they wish to become pregnant or think they might be pregnant. There is a potential for serious side effects to an unborn child. Talk to your health care professional or pharmacist for more information. Do not breast-feed an infant while taking this medicine. Men are advised not to father a child while receiving this medicine. This product may contain alcohol. Ask your pharmacist or healthcare provider if this medicine contains alcohol. Be sure to tell all healthcare providers you are taking this medicine. Certain medicines, like metronidazole and disulfiram, can cause an unpleasant reaction when taken with alcohol. The reaction includes flushing, headache, nausea, vomiting, sweating, and increased thirst. The reaction can last from 30 minutes to several hours. What side effects may I notice from receiving this medicine? Side effects that you should report to your doctor or health care professional as soon as possible: -allergic reactions like skin rash, itching or hives, swelling of the face, lips, or tongue -low blood counts - This drug may decrease the number of white blood cells, red blood cells and platelets. You may be at increased risk for infections and bleeding. -signs of infection - fever or chills, cough, sore throat, pain or difficulty passing urine -signs of decreased platelets or bleeding - bruising, pinpoint red spots on the skin, black, tarry stools, nosebleeds -signs of decreased red blood cells - unusually weak or tired, fainting spells, lightheadedness -breathing problems -chest pain -high or low blood pressure -mouth sores -nausea and vomiting -pain, swelling, redness or irritation at the injection site -pain, tingling, numbness in the hands or feet -slow or irregular heartbeat -swelling of the ankle, feet, hands Side effects that  usually do not require medical attention (report to your doctor or health care professional if they continue or are bothersome): -bone pain -complete hair loss including hair on your head, underarms, pubic hair, eyebrows, and eyelashes -changes in the color of fingernails -diarrhea -loosening of the fingernails -loss of appetite -muscle or joint pain -red flush to skin -sweating This list may not describe all possible side effects. Call your doctor for medical advice about side effects. You may report side effects to FDA at 1-800-FDA-1088. Where should I keep my medicine? This drug is given in a hospital or clinic and will not be stored at home. NOTE: This sheet is a summary. It may not cover all possible information. If you have questions about this medicine, talk to your doctor, pharmacist, or health care provider.    2016, Elsevier/Gold Standard. (2014-11-06 13:02:56)  

## 2015-03-20 ENCOUNTER — Telehealth: Payer: Self-pay | Admitting: Genetic Counselor

## 2015-03-22 ENCOUNTER — Other Ambulatory Visit: Payer: Self-pay | Admitting: Oncology

## 2015-03-22 DIAGNOSIS — C569 Malignant neoplasm of unspecified ovary: Secondary | ICD-10-CM

## 2015-03-23 ENCOUNTER — Telehealth: Payer: Self-pay | Admitting: Oncology

## 2015-03-23 ENCOUNTER — Other Ambulatory Visit (HOSPITAL_BASED_OUTPATIENT_CLINIC_OR_DEPARTMENT_OTHER): Payer: Medicare Other

## 2015-03-23 ENCOUNTER — Encounter: Payer: Self-pay | Admitting: Oncology

## 2015-03-23 ENCOUNTER — Ambulatory Visit (HOSPITAL_BASED_OUTPATIENT_CLINIC_OR_DEPARTMENT_OTHER): Payer: Medicare Other | Admitting: Oncology

## 2015-03-23 ENCOUNTER — Ambulatory Visit: Payer: Self-pay | Admitting: Genetic Counselor

## 2015-03-23 VITALS — BP 144/85 | HR 89 | Temp 97.6°F | Resp 18 | Ht 64.0 in | Wt 230.0 lb

## 2015-03-23 DIAGNOSIS — D509 Iron deficiency anemia, unspecified: Secondary | ICD-10-CM | POA: Diagnosis not present

## 2015-03-23 DIAGNOSIS — Z95828 Presence of other vascular implants and grafts: Secondary | ICD-10-CM

## 2015-03-23 DIAGNOSIS — C787 Secondary malignant neoplasm of liver and intrahepatic bile duct: Secondary | ICD-10-CM | POA: Diagnosis not present

## 2015-03-23 DIAGNOSIS — C562 Malignant neoplasm of left ovary: Secondary | ICD-10-CM

## 2015-03-23 DIAGNOSIS — Z1379 Encounter for other screening for genetic and chromosomal anomalies: Secondary | ICD-10-CM | POA: Insufficient documentation

## 2015-03-23 DIAGNOSIS — C569 Malignant neoplasm of unspecified ovary: Secondary | ICD-10-CM

## 2015-03-23 LAB — COMPREHENSIVE METABOLIC PANEL
ALBUMIN: 3.4 g/dL — AB (ref 3.5–5.0)
ALK PHOS: 104 U/L (ref 40–150)
ALT: 12 U/L (ref 0–55)
AST: 19 U/L (ref 5–34)
Anion Gap: 9 mEq/L (ref 3–11)
BILIRUBIN TOTAL: 0.34 mg/dL (ref 0.20–1.20)
BUN: 16 mg/dL (ref 7.0–26.0)
CO2: 24 mEq/L (ref 22–29)
CREATININE: 1 mg/dL (ref 0.6–1.1)
Calcium: 9.3 mg/dL (ref 8.4–10.4)
Chloride: 107 mEq/L (ref 98–109)
EGFR: 69 mL/min/{1.73_m2} — ABNORMAL LOW (ref >90)
GLUCOSE: 95 mg/dL (ref 70–140)
Potassium: 4 mEq/L (ref 3.5–5.1)
SODIUM: 140 meq/L (ref 136–145)
TOTAL PROTEIN: 7.2 g/dL (ref 6.4–8.3)

## 2015-03-23 LAB — CBC WITH DIFFERENTIAL/PLATELET
BASO%: 0.5 % (ref 0.0–2.0)
Basophils Absolute: 0 10*3/uL (ref 0.0–0.1)
EOS ABS: 0 10*3/uL (ref 0.0–0.5)
EOS%: 0.5 % (ref 0.0–7.0)
HCT: 37.2 % (ref 34.8–46.6)
HEMOGLOBIN: 11.6 g/dL (ref 11.6–15.9)
LYMPH%: 31.3 % (ref 14.0–49.7)
MCH: 26 pg (ref 25.1–34.0)
MCHC: 31.1 g/dL — ABNORMAL LOW (ref 31.5–36.0)
MCV: 83.6 fL (ref 79.5–101.0)
MONO#: 0.2 10*3/uL (ref 0.1–0.9)
MONO%: 3.1 % (ref 0.0–14.0)
NEUT%: 64.6 % (ref 38.4–76.8)
NEUTROS ABS: 3.8 10*3/uL (ref 1.5–6.5)
Platelets: 251 10*3/uL (ref 145–400)
RBC: 4.45 10*6/uL (ref 3.70–5.45)
RDW: 16.6 % — ABNORMAL HIGH (ref 11.2–14.5)
WBC: 5.8 10*3/uL (ref 3.9–10.3)
lymph#: 1.8 10*3/uL (ref 0.9–3.3)

## 2015-03-23 NOTE — Telephone Encounter (Signed)
Discussed with Ms. Melissa Ball that her genetic test results were negative for pathogenic mutations within any of 20 genes that would increase her risks for breast, ovarian, or other related cancers.  Additionally, no uncertain changes were found.  Discussed that this is likely a reassuring result for [place] since most of the other cancers in the family were also diagnosed at much later ages in life.  Discussed that Ms. Melissa Ball should continue to follow her providers' cancer screening recommendations in the future.  Discussed that women in the family are still considered to be at a somewhat increased risk for breast and ovarian cancer just based on the family history.  They should begin annual mammogram screening at the age of 21.  Discussed that because we do not have good ovarian cancer screening, that we generally do not recommend ovarian cancer screening, but that her sisters and nieces should make their primary care providers aware of this history in the family, so they may receive the most appropriate cancer screening in the future.  Men in the family, including Ms. Melissa Ball's son, should follow general screening guidelines for prostate cancer screening.  Family members should begin colonoscopy screening at the age of 28.  Also discussed that Ms. Melissa Ball's niece who was diagnosed with thyroid cancer at the age of 68 could be seen for genetic counseling and potentially genetic testing based on her young age of diagnosis.  Ms. Melissa Ball is happy to receive these negative results.  She has an appointment with Dr. Marko Plume this afternoon, so I will make sure Dr. Marko Plume is aware that results are back and are negative.  Ms. Melissa Ball is welcome to call me with any further questions.

## 2015-03-23 NOTE — Telephone Encounter (Signed)
Lab added to 04/16/15 and patient has 12/19 in place

## 2015-03-23 NOTE — Progress Notes (Signed)
GENETIC TEST RESULT  HPI: Melissa Ball was previously seen in the [facility] clinic due to a personal history of ovarian cancer, family history of breast and other cancers, and concerns regarding a hereditary predisposition to cancer. Please refer to our prior cancer genetics clinic note from January 15, 2015 for more information regarding Melissa Ball's medical, social and family histories, and our assessment and recommendations, at the time. Melissa Ball recent genetic test results were disclosed to her, as were recommendations warranted by these results. These results and recommendations are discussed in more detail below.  GENETIC TEST RESULTS: At the time of Melissa Ball's visit on 01/15/15, we recommended she pursue genetic testing of the 20-gene Breast/Ovarian Cancer Panel through [facility].  The Breast/Ovarian Cancer Panel offered by [facility] Hope Pigeon, MD) includes sequencing and deletion/duplication analysis for the following 19 genes:  ATM, BARD1, BRCA1, BRCA2, BRIP1, CDH1, CHEK2, FANCC, MLH1, MSH2, MSH6, NBN, PALB2, PMS2, PTEN, RAD51C, RAD51D, TP53, and XRCC2.  This panel also includes deletion/duplication analysis (without sequencing) for one gene, EPCAM.  Those results are now back, the report date for which is March 18, 2015.  Genetic testing was normal, and did not reveal a deleterious mutation in these genes.  Additionally, no variants of uncertain significance (VUSes) were found.  The test report will be scanned into EPIC and will be located under the Results Review tab in the Pathology>Molecular Pathology section.   We discussed with Melissa Ball that since the current genetic testing is not perfect, it is possible there may be a gene mutation in one of these genes that current testing cannot detect, but that chance is small. We also discussed, that it is possible that another gene that has not yet been discovered, or that we have not yet tested, is  responsible for the cancer diagnoses in the family, and it is, therefore, important to remain in touch with cancer genetics in the future so that we can continue to offer Melissa Ball the most up to date genetic testing.   CANCER SCREENING RECOMMENDATIONS: This result is reassuring and indicates that Melissa Ball likely does not have an increased risk for a future cancer due to a mutation in one of these genes. This normal test also suggests that Melissa Ball's cancer was most likely not due to an inherited predisposition associated with one of these genes.  Most cancers happen by chance and this negative test suggests that her cancer falls into this category.  Additionally, many of the other cancers diagnosed in the family were diagnosed at much later ages in life.  We, therefore, recommended she continue to follow the cancer management and screening guidelines provided by her oncology and primary healthcare providers.   RECOMMENDATIONS FOR FAMILY MEMBERS: Women in this family might be at some increased risk of developing cancer, over the general population risk, simply due to the family history of cancer. We recommended women in this family have a yearly mammogram beginning at age 43, or 33 years younger than the earliest onset of cancer, an an annual clinical breast exam, and perform monthly breast self-exams. Women in this family should also have a gynecological exam as recommended by their primary provider. All family members should have a colonoscopy by age 69.  Based on Melissa Ball's family history, we recommended her niece, who was diagnosed with thyroid cancer at age 16, have genetic counseling and potentially testing. Melissa Ball will let us know if we can be of any assistance in coordinating  genetic counseling and/or testing for this family member.   FOLLOW-UP: Lastly, we discussed with Melissa Ball that cancer genetics is a rapidly advancing field and it is possible that new genetic tests will be  appropriate for her and/or her family members in the future. We encouraged her to remain in contact with cancer genetics on an annual basis so we can update her personal and family histories and let her know of advances in cancer genetics that may benefit this family.   Our contact number was provided. Melissa Ball questions were answered to her satisfaction, and she knows she is welcome to call us at anytime with additional questions or concerns.   Jeanine Luz, MS Genetic Counselor [email or web address] Phone: [phone]

## 2015-03-23 NOTE — Progress Notes (Signed)
OFFICE PROGRESS NOTE   March 23, 2015   Physicians: Everitt Amber ,Biagio Borg, MD , Aloha Gell  INTERVAL HISTORY:  Patient is seen, together with son, now on taxol + avastin for progressive ovarian cancer, with metastatic involvement of liver after 4 cycles of doxil. She is due day 15 cycle 1 taxol + avastin on 03-26-15. The taxol is planned days 04-11-13 and avastin days 1 and 15 every 28 days.   Patient has had some GI discomfort intermittently, tho not frank pain, including some GERD not on medication, some belching and lower abdominal gas/ indigestion, no vomiting, and has used only occasional prn antiemetic. Bowels are still not moving daily despite attention to this with diet, and I have asked her to use miralax one capful daily - bid OR senokot S 2 tablets daily or bid. She denies SOB or chest discomfort, is able to sleep, not particularly fatigued, no problems with PAC. She has had no fever or symptoms of infection. She has some low back discomfort which is not new, better with activity.  No fever or symptoms of infection. No bleeding. No problems with PAC Remainder of 10 point Review of Systems negative/ unchanged.   PAC in Genetics testing sent 03-02-15, pending.  CA 125 was 1406 in 10-2013 and was 151 as baseline for doxil on 10-10-14 Flu vaccine 01-05-15  ONCOLOGIC HISTORY Patient had acute abdominal pain Dec 2014, which resolved without medical attention, then vague diffuse abdominal discomfort, low grade nausea and abdominal bloating. She was seen in June 2015 for first gyn exam in years, PAP with AGUS and follow up biopsies of endocervix and endometrium by Dr Pamala Hurry both with serous apparent endometrial cancer (pathology from [facility] 09-12-13, case # [id number] to be scanned into this EMR). Biopsy of cervix had normal squamous epithelium. CA125 from [facility] 09-16-13 was 964, and was up to 1406 by 10-23-13.Marland Kitchen She was seen by Dr Denman George on 09-23-13, her exam remarkable  for large pelvic mass minimally mobile and extending to umbilicus. She had CT CAP 09-26-13 had prominent but not enlarged juxtapericardiac lymph nodes, trace right pleural effusion, large amount of abnormal soft tissue thruout peritoneal cavity, predominately with omentum and especially LUQ, largest area 3.8 x 3.0 x 4.0 cm anterior to proximal descending colon, implants adjacent to liver, large complex multicystic lesion in pelvis inseperable from uterus and adnexae, with mass effect on bladder, questionable area in central aspect of dome of liver, borderline enlarged retroperitoneal nodes. Due to extent of disease, treatment began with chemotherapy. Cycle one taxol carboplatin was given on 2-63-78, complicated by severe nausea/vomiting, constipation and severe taxol aches. Cycle 2 was changed to dose dense carbo taxol, day 1 on 11-01-13. She needed gCSF support by cycle 3. Neoadjuvant chemo was one cycle carbo taxol standard dose and 3 cycles dose dense. 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 She resumed chemo with cycle 4 on 02-21-14 and completed cycle 6 on 04-25-14. She had unremarkable exam by Dr Denman George 08-11-14, however increase in CA 125 marker then, from 8 in 05-2014 to 26 in 08-2014, and repeat 63 on 09-08-14. CT AP 09-16-14 shows progressive peritoneal metastases, increasing retroperitoneal and transverse mesocolon nodes and soft tissue area at vaginal cuff stable to slightly decreased. She began doxil on 10-10-14, CA 125 151 that day as baseline for doxil. She had 4 cycles of doxil thru 01-09-15,  then CT 02-11-15 showed mixed response but with multiple new liver metastases. CA 125 on 02-02-15 was 299.   Objective:  Vital signs in last 24 hours:  BP 144/85 mmHg  Pulse 89  Temp(Src) 97.6 F (36.4 C) (Oral)  Resp 18  Ht 5' 4"  (1.626 m)  Wt 230 lb (104.327 kg)  BMI 39.46 kg/m2  SpO2  100% Weight down 5 lbs. Alert, oriented and appropriate. Ambulatory without assistance. Looks comfortable, respirations not labored, able to get on and off exam table with minimal assistance No alopecia  HEENT:PERRL, sclerae not icteric. Oral mucosa moist without lesions, posterior pharynx clear.  Neck supple. No JVD.  Lymphatics:no cervical,supraclavicular adenopathy Resp: clear to auscultation bilaterally and normal percussion bilaterally Cardio: regular rate and rhythm. No gallop. GI: abdomen obese, soft, nontender, not distended, cannot appreciate mass or organomegaly. A few bowel sounds. Surgical incision not remarkable. Musculoskeletal/ Extremities: without pitting edema, cords, tenderness Neuro: no increase peripheral neuropathy. Otherwise nonfocal. PSYCH appropriate mood and affect Skin without rash, ecchymosis, petechiae Portacath-without erythema or tenderness  Lab Results:  Results for orders placed or performed in visit on 03/23/15  CBC with Differential  Result Value Ref Range   WBC 5.8 3.9 - 10.3 10e3/uL   NEUT# 3.8 1.5 - 6.5 10e3/uL   HGB 11.6 11.6 - 15.9 g/dL   HCT 37.2 34.8 - 46.6 %   Platelets 251 145 - 400 10e3/uL   MCV 83.6 79.5 - 101.0 fL   MCH 26.0 25.1 - 34.0 pg   MCHC 31.1 (L) 31.5 - 36.0 g/dL   RBC 4.45 3.70 - 5.45 10e6/uL   RDW 16.6 (H) 11.2 - 14.5 %   lymph# 1.8 0.9 - 3.3 10e3/uL   MONO# 0.2 0.1 - 0.9 10e3/uL   Eosinophils Absolute 0.0 0.0 - 0.5 10e3/uL   Basophils Absolute 0.0 0.0 - 0.1 10e3/uL   NEUT% 64.6 38.4 - 76.8 %   LYMPH% 31.3 14.0 - 49.7 %   MONO% 3.1 0.0 - 14.0 %   EOS% 0.5 0.0 - 7.0 %   BASO% 0.5 0.0 - 2.0 %  Comprehensive metabolic panel  Result Value Ref Range   Sodium 140 136 - 145 mEq/L   Potassium 4.0 3.5 - 5.1 mEq/L   Chloride 107 98 - 109 mEq/L   CO2 24 22 - 29 mEq/L   Glucose 95 70 - 140 mg/dl   BUN 16.0 7.0 - 26.0 mg/dL   Creatinine 1.0 0.6 - 1.1 mg/dL   Total Bilirubin 0.34 0.20 - 1.20 mg/dL   Alkaline Phosphatase 104  40 - 150 U/L   AST 19 5 - 34 U/L   ALT 12 0 - 55 U/L   Total Protein 7.2 6.4 - 8.3 g/dL   Albumin 3.4 (L) 3.5 - 5.0 g/dL   Calcium 9.3 8.4 - 10.4 mg/dL   Anion Gap 9 3 - 11 mEq/L   EGFR 69 (L) >90 ml/min/1.73 m2    UA protein not done today so will need that day of treatment 03-26-15.  CA 125 692 on 03-12-15, baseline for taxol avastin.  Studies/Results:  No results found.  Medications: I have reviewed the patient's current medications. Miralax 1 capful 1-2x daily or senokot S 2 tablets 1-2x daily. Will add protonix or equivalent daily  Note no antihypertensives, may need to be added if BP elevates due to avastin  DISCUSSION  Son has expressed his concerns around mother's diagnosis of recurrent, metastatic ovarian cancer, which they understand is not curable, tho we  hope that it will be controlled with treatment and that quality of life will be maintained with treatment as best possible. Son is concerned that she has some anxiety, discussed prn ativan tho none of [place] want her so sedated that she cannot do enjoyable activities. Son plans to try to improve social interactions that are adding to her stress, also is encouraging good diet and will suggest some gentle stretching exercises for her low back (son is Production assistant, radio).  Discussed following CA 125 and repeating scans after ~ 3 cycles or sooner if concerns.  Medications discussed as above.  Patient feels that treatment is tolerable now and is in agreement with continuing. She is enjoying time with her son during this visit. She cooked 4 dishes for family holiday meal over weekend.       Assessment/Plan:  1. 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 Recurrent disease found by increasing CA 125 initially 08-2014 . Mixed response to 4 cycles of doxil thru 01-09-15, but multiple new liver mets. Now trying weekly  taxol days 1,8,15 every 28 days with avastin days 1 and 15  2.Genetics testing pending 3 PAC in 4.GI symptoms multifactorial, but may benefit from regular laxatives and H2 blocker. 5.Anemia previously: iron deficiency and chemo. Hgb still good at 11.6 Continue po iron for now. 6.neuropathy symptoms from taxol feet>hands now essentially resolved. Will follow with resumption of that drug. 7.does not have Advance Directives per EMR 8.flu vaccine 01-05-15 9.EF good by echocardiogram prior to #4 doxil 10.morbid obesity, BMI 39. Again encouraged her to eat as healthy diet as possible, may want to try protein powder into other cooking. [email or web address] low back discomfort now new: suggested heating pad and gentle stretching exercises, son to assist  By conclusion of lengthy discussion , son and patient have had questions answered to their satisfaction and are in agreement with recommendations and plans. Chemo and avastin orders confirmed. Cc Dr Jenny Reichmann to update. Time spent 35 min including >50% counseling and coordination of care   Duane Trias P, MD   03/23/2015, 6:17 PM

## 2015-03-24 ENCOUNTER — Telehealth: Payer: Self-pay

## 2015-03-24 ENCOUNTER — Telehealth: Payer: Self-pay | Admitting: Oncology

## 2015-03-24 DIAGNOSIS — C569 Malignant neoplasm of unspecified ovary: Secondary | ICD-10-CM

## 2015-03-24 DIAGNOSIS — K219 Gastro-esophageal reflux disease without esophagitis: Secondary | ICD-10-CM

## 2015-03-24 MED ORDER — PANTOPRAZOLE SODIUM 40 MG PO TBEC: 40.0000 mg | DELAYED_RELEASE_TABLET | Freq: Every day | ORAL | Status: DC

## 2015-03-24 NOTE — Telephone Encounter (Signed)
Left a message with added urine 12/22

## 2015-03-24 NOTE — Telephone Encounter (Signed)
Reviewed directions for Protonix and laxatives as noted below by Dr. Marko Plume. Emphasized the importance of deligence otf keeping bowels moving well. Melissa Ball can call back to the [facility] if she has any questions or concerns.

## 2015-03-24 NOTE — Telephone Encounter (Signed)
-----   Message from Gordy Levan, MD sent at 03/24/2015 12:37 PM EST -----   Needs protonix (or substitute if insurance prefers)_ 40 mg daily #30 2 RF  for acid reflux. Generic fine  As discussed at visit, she is to add daily-bid  miralax or senokotS 2 tabs daily -bid to keep bowels moving daily.  thanks

## 2015-03-26 ENCOUNTER — Ambulatory Visit (HOSPITAL_BASED_OUTPATIENT_CLINIC_OR_DEPARTMENT_OTHER): Payer: Medicare Other

## 2015-03-26 ENCOUNTER — Other Ambulatory Visit (HOSPITAL_BASED_OUTPATIENT_CLINIC_OR_DEPARTMENT_OTHER): Payer: Medicare Other

## 2015-03-26 VITALS — BP 156/86 | HR 81 | Temp 98.3°F | Resp 18

## 2015-03-26 DIAGNOSIS — Z5111 Encounter for antineoplastic chemotherapy: Secondary | ICD-10-CM | POA: Diagnosis not present

## 2015-03-26 DIAGNOSIS — C569 Malignant neoplasm of unspecified ovary: Secondary | ICD-10-CM

## 2015-03-26 DIAGNOSIS — Z5112 Encounter for antineoplastic immunotherapy: Secondary | ICD-10-CM | POA: Diagnosis not present

## 2015-03-26 DIAGNOSIS — C562 Malignant neoplasm of left ovary: Secondary | ICD-10-CM

## 2015-03-26 LAB — CBC WITH DIFFERENTIAL/PLATELET
BASO%: 0.1 % (ref 0.0–2.0)
Basophils Absolute: 0 10*3/uL (ref 0.0–0.1)
EOS ABS: 0 10*3/uL (ref 0.0–0.5)
EOS%: 0 % (ref 0.0–7.0)
HCT: 37.4 % (ref 34.8–46.6)
HGB: 11.7 g/dL (ref 11.6–15.9)
LYMPH%: 14 % (ref 14.0–49.7)
MCH: 25.9 pg (ref 25.1–34.0)
MCHC: 31.4 g/dL — AB (ref 31.5–36.0)
MCV: 82.5 fL (ref 79.5–101.0)
MONO#: 0 10*3/uL — ABNORMAL LOW (ref 0.1–0.9)
MONO%: 1.3 % (ref 0.0–14.0)
NEUT%: 84.6 % — AB (ref 38.4–76.8)
NEUTROS ABS: 3.3 10*3/uL (ref 1.5–6.5)
Platelets: 250 10*3/uL (ref 145–400)
RBC: 4.53 10*6/uL (ref 3.70–5.45)
RDW: 16.4 % — ABNORMAL HIGH (ref 11.2–14.5)
WBC: 3.9 10*3/uL (ref 3.9–10.3)
lymph#: 0.6 10*3/uL — ABNORMAL LOW (ref 0.9–3.3)

## 2015-03-26 LAB — COMPREHENSIVE METABOLIC PANEL
ALT: 12 U/L (ref 0–55)
AST: 16 U/L (ref 5–34)
Albumin: 3.5 g/dL (ref 3.5–5.0)
Alkaline Phosphatase: 112 U/L (ref 40–150)
Anion Gap: 9 mEq/L (ref 3–11)
BUN: 13 mg/dL (ref 7.0–26.0)
CHLORIDE: 111 meq/L — AB (ref 98–109)
CO2: 22 meq/L (ref 22–29)
Calcium: 9.8 mg/dL (ref 8.4–10.4)
Creatinine: 0.9 mg/dL (ref 0.6–1.1)
EGFR: 77 mL/min/{1.73_m2} — AB (ref >90)
GLUCOSE: 145 mg/dL — AB (ref 70–140)
POTASSIUM: 4.6 meq/L (ref 3.5–5.1)
SODIUM: 141 meq/L (ref 136–145)
TOTAL PROTEIN: 7.6 g/dL (ref 6.4–8.3)

## 2015-03-26 LAB — UA PROTEIN, DIPSTICK - CHCC: PROTEIN: NEGATIVE mg/dL

## 2015-03-26 MED ORDER — FAMOTIDINE IN NACL 20-0.9 MG/50ML-% IV SOLN
20.0000 mg | Freq: Once | INTRAVENOUS | Status: AC
  Administered 2015-03-26: 20 mg via INTRAVENOUS

## 2015-03-26 MED ORDER — HEPARIN SOD (PORK) LOCK FLUSH 100 UNIT/ML IV SOLN
500.0000 [IU] | Freq: Once | INTRAVENOUS | Status: AC | PRN
  Administered 2015-03-26: 500 [IU]
  Filled 2015-03-26: qty 5

## 2015-03-26 MED ORDER — SODIUM CHLORIDE 0.9 % IV SOLN
Freq: Once | INTRAVENOUS | Status: AC
  Administered 2015-03-26: 14:00:00 via INTRAVENOUS

## 2015-03-26 MED ORDER — SODIUM CHLORIDE 0.9 % IV SOLN
10.2000 mg/kg | Freq: Once | INTRAVENOUS | Status: AC
  Administered 2015-03-26: 1100 mg via INTRAVENOUS
  Filled 2015-03-26: qty 36

## 2015-03-26 MED ORDER — SODIUM CHLORIDE 0.9 % IV SOLN
Freq: Once | INTRAVENOUS | Status: AC
  Administered 2015-03-26: 14:00:00 via INTRAVENOUS
  Filled 2015-03-26: qty 8

## 2015-03-26 MED ORDER — DIPHENHYDRAMINE HCL 50 MG/ML IJ SOLN
50.0000 mg | Freq: Once | INTRAMUSCULAR | Status: AC
  Administered 2015-03-26: 50 mg via INTRAVENOUS

## 2015-03-26 MED ORDER — PACLITAXEL CHEMO INJECTION 300 MG/50ML
80.0000 mg/m2 | Freq: Once | INTRAVENOUS | Status: AC
  Administered 2015-03-26: 174 mg via INTRAVENOUS
  Filled 2015-03-26: qty 29

## 2015-03-26 MED ORDER — DIPHENHYDRAMINE HCL 50 MG/ML IJ SOLN
INTRAMUSCULAR | Status: AC
  Filled 2015-03-26: qty 1

## 2015-03-26 MED ORDER — FAMOTIDINE IN NACL 20-0.9 MG/50ML-% IV SOLN
INTRAVENOUS | Status: AC
  Filled 2015-03-26: qty 50

## 2015-03-26 MED ORDER — SODIUM CHLORIDE 0.9 % IJ SOLN
10.0000 mL | INTRAMUSCULAR | Status: DC | PRN
  Administered 2015-03-26: 10 mL
  Filled 2015-03-26: qty 10

## 2015-03-26 NOTE — Patient Instructions (Addendum)
[facility] Discharge Instructions for Patients Receiving Chemotherapy  Today you received the following chemotherapy agents:  Avastin and Taxol  To help prevent nausea and vomiting after your treatment, we encourage you to take your nausea medication as ordered per MD.   If you develop nausea and vomiting that is not controlled by your nausea medication, call the clinic.   BELOW ARE SYMPTOMS THAT SHOULD BE REPORTED IMMEDIATELY:  *FEVER GREATER THAN 100.5 F  *CHILLS WITH OR WITHOUT FEVER  NAUSEA AND VOMITING THAT IS NOT CONTROLLED WITH YOUR NAUSEA MEDICATION  *UNUSUAL SHORTNESS OF BREATH  *UNUSUAL BRUISING OR BLEEDING  TENDERNESS IN MOUTH AND THROAT WITH OR WITHOUT PRESENCE OF ULCERS  *URINARY PROBLEMS  *BOWEL PROBLEMS  UNUSUAL RASH Items with * indicate a potential emergency and should be followed up as soon as possible.  Feel free to call the clinic you have any questions or concerns. The clinic phone number is [phone].  Please show the [facility] at check-in to the Emergency Department and triage nurse.  Bevacizumab injection What is this medicine? BEVACIZUMAB (be va SIZ yoo mab) is a monoclonal antibody. It is used to treat cervical cancer, colorectal cancer, glioblastoma multiforme, non-small cell lung cancer (NSCLC), ovarian cancer, and renal cell cancer. This medicine may be used for other purposes; ask your health care provider or pharmacist if you have questions. What should I tell my health care provider before I take this medicine? They need to know if you have any of these conditions: -blood clots -heart disease, including heart failure, heart attack, or chest pain (angina) -high blood pressure -infection (especially a virus infection such as chickenpox, cold sores, or herpes) -kidney disease -lung disease -prior chemotherapy with doxorubicin, daunorubicin, epirubicin, or other anthracycline type chemotherapy agents -recent or  ongoing radiation therapy -recent surgery -stroke -an unusual or allergic reaction to bevacizumab, hamster proteins, mouse proteins, other medicines, foods, dyes, or preservatives -pregnant or trying to get pregnant -breast-feeding How should I use this medicine? This medicine is for infusion into a vein. It is given by a health care professional in a hospital or clinic setting. Talk to your pediatrician regarding the use of this medicine in children. Special care may be needed. Overdosage: If you think you have taken too much of this medicine contact a poison control center or emergency room at once. NOTE: This medicine is only for you. Do not share this medicine with others. What if I miss a dose? It is important not to miss your dose. Call your doctor or health care professional if you are unable to keep an appointment. What may interact with this medicine? Interactions are not expected. This list may not describe all possible interactions. Give your health care provider a list of all the medicines, herbs, non-prescription drugs, or dietary supplements you use. Also tell them if you smoke, drink alcohol, or use illegal drugs. Some items may interact with your medicine. What should I watch for while using this medicine? Your condition will be monitored carefully while you are receiving this medicine. You will need important blood work and urine testing done while you are taking this medicine. During your treatment, let your health care professional know if you have any unusual symptoms, such as difficulty breathing. This medicine may rarely cause 'gastrointestinal perforation' (holes in the stomach, intestines or colon), a serious side effect requiring surgery to repair. This medicine should be started at least 28 days following major surgery and the site of the  surgery should be totally healed. Check with your doctor before scheduling dental work or surgery while you are receiving this  treatment. Talk to your doctor if you have recently had surgery or if you have a wound that has not healed. Do not become pregnant while taking this medicine or for 6 months after stopping it. Women should inform their doctor if they wish to become pregnant or think they might be pregnant. There is a potential for serious side effects to an unborn child. Talk to your health care professional or pharmacist for more information. Do not breast-feed an infant while taking this medicine. This medicine has caused ovarian failure in some women. This medicine may interfere with the ability to have a child. You should talk to your doctor or health care professional if you are concerned about your fertility. What side effects may I notice from receiving this medicine? Side effects that you should report to your doctor or health care professional as soon as possible: -allergic reactions like skin rash, itching or hives, swelling of the face, lips, or tongue -signs of infection - fever or chills, cough, sore throat, pain or trouble passing urine -signs of decreased platelets or bleeding - bruising, pinpoint red spots on the skin, black, tarry stools, nosebleeds, blood in the urine -breathing problems -changes in vision -chest pain -confusion -jaw pain, especially after dental work -mouth sores -seizures -severe abdominal pain -severe headache -sudden numbness or weakness of the face, arm or leg -swelling of legs or ankles -symptoms of a stroke: change in mental awareness, inability to talk or move one side of the body (especially in patients with lung cancer) -trouble passing urine or change in the amount of urine -trouble speaking or understanding -trouble walking, dizziness, loss of balance or coordination Side effects that usually do not require medical attention (report to your doctor or health care professional if they continue or are bothersome): -constipation -diarrhea -dry  skin -headache -loss of appetite -nausea, vomiting This list may not describe all possible side effects. Call your doctor for medical advice about side effects. You may report side effects to FDA at 1-800-FDA-1088. Where should I keep my medicine? This drug is given in a hospital or clinic and will not be stored at home. NOTE: This sheet is a summary. It may not cover all possible information. If you have questions about this medicine, talk to your doctor, pharmacist, or health care provider.    2016, [employer]vier/Gold Standard. (2014-05-20 16:58:44)

## 2015-04-01 ENCOUNTER — Encounter (HOSPITAL_COMMUNITY): Payer: Self-pay

## 2015-04-05 ENCOUNTER — Other Ambulatory Visit: Payer: Self-pay | Admitting: Oncology

## 2015-04-05 DIAGNOSIS — C569 Malignant neoplasm of unspecified ovary: Secondary | ICD-10-CM

## 2015-04-07 ENCOUNTER — Ambulatory Visit (HOSPITAL_BASED_OUTPATIENT_CLINIC_OR_DEPARTMENT_OTHER): Payer: Medicare Other

## 2015-04-07 ENCOUNTER — Ambulatory Visit (HOSPITAL_BASED_OUTPATIENT_CLINIC_OR_DEPARTMENT_OTHER): Payer: Medicare Other | Admitting: Oncology

## 2015-04-07 ENCOUNTER — Encounter: Payer: Self-pay | Admitting: Oncology

## 2015-04-07 ENCOUNTER — Other Ambulatory Visit (HOSPITAL_BASED_OUTPATIENT_CLINIC_OR_DEPARTMENT_OTHER): Payer: Medicare Other

## 2015-04-07 VITALS — BP 145/83 | HR 90 | Temp 97.6°F | Resp 18 | Ht 64.0 in | Wt 236.7 lb

## 2015-04-07 DIAGNOSIS — C562 Malignant neoplasm of left ovary: Secondary | ICD-10-CM

## 2015-04-07 DIAGNOSIS — Z95828 Presence of other vascular implants and grafts: Secondary | ICD-10-CM

## 2015-04-07 DIAGNOSIS — C787 Secondary malignant neoplasm of liver and intrahepatic bile duct: Secondary | ICD-10-CM

## 2015-04-07 DIAGNOSIS — C569 Malignant neoplasm of unspecified ovary: Secondary | ICD-10-CM

## 2015-04-07 LAB — CBC WITH DIFFERENTIAL/PLATELET
BASO%: 0.3 % (ref 0.0–2.0)
Basophils Absolute: 0 10*3/uL (ref 0.0–0.1)
EOS%: 0.6 % (ref 0.0–7.0)
Eosinophils Absolute: 0 10*3/uL (ref 0.0–0.5)
HEMATOCRIT: 35.4 % (ref 34.8–46.6)
HEMOGLOBIN: 11.2 g/dL — AB (ref 11.6–15.9)
LYMPH#: 1.7 10*3/uL (ref 0.9–3.3)
LYMPH%: 37.4 % (ref 14.0–49.7)
MCH: 26.2 pg (ref 25.1–34.0)
MCHC: 31.7 g/dL (ref 31.5–36.0)
MCV: 82.8 fL (ref 79.5–101.0)
MONO#: 0.5 10*3/uL (ref 0.1–0.9)
MONO%: 12 % (ref 0.0–14.0)
NEUT%: 49.7 % (ref 38.4–76.8)
NEUTROS ABS: 2.2 10*3/uL (ref 1.5–6.5)
Platelets: 230 10*3/uL (ref 145–400)
RBC: 4.28 10*6/uL (ref 3.70–5.45)
RDW: 17.7 % — ABNORMAL HIGH (ref 11.2–14.5)
WBC: 4.4 10*3/uL (ref 3.9–10.3)

## 2015-04-07 LAB — COMPREHENSIVE METABOLIC PANEL
ALK PHOS: 104 U/L (ref 40–150)
ALT: 12 U/L (ref 0–55)
AST: 17 U/L (ref 5–34)
Albumin: 3.4 g/dL — ABNORMAL LOW (ref 3.5–5.0)
Anion Gap: 8 mEq/L (ref 3–11)
BUN: 12.5 mg/dL (ref 7.0–26.0)
CHLORIDE: 109 meq/L (ref 98–109)
CO2: 23 mEq/L (ref 22–29)
Calcium: 9.3 mg/dL (ref 8.4–10.4)
Creatinine: 0.8 mg/dL (ref 0.6–1.1)
GLUCOSE: 89 mg/dL (ref 70–140)
POTASSIUM: 3.8 meq/L (ref 3.5–5.1)
SODIUM: 141 meq/L (ref 136–145)
Total Bilirubin: <0.3 mg/dL (ref 0.20–1.20)
Total Protein: 7.1 g/dL (ref 6.4–8.3)

## 2015-04-07 LAB — UA PROTEIN, DIPSTICK - CHCC: Protein, ur: NEGATIVE mg/dL

## 2015-04-07 MED ORDER — SODIUM CHLORIDE 0.9 % IJ SOLN
10.0000 mL | INTRAMUSCULAR | Status: DC | PRN
  Administered 2015-04-07: 10 mL via INTRAVENOUS
  Filled 2015-04-07: qty 10

## 2015-04-07 MED ORDER — HEPARIN SOD (PORK) LOCK FLUSH 100 UNIT/ML IV SOLN
500.0000 [IU] | Freq: Once | INTRAVENOUS | Status: AC
  Administered 2015-04-07: 500 [IU] via INTRAVENOUS
  Filled 2015-04-07: qty 5

## 2015-04-07 NOTE — Progress Notes (Signed)
OFFICE PROGRESS NOTE   April 08, 2015   Physicians:Emma Shelda Jakes, Hunt Oris, MD , Aloha Gell  INTERVAL HISTORY:  Patient is seen, together with niece, in continuing attention to recurrent ovarian carcinoma which is now being treated with taxol and avastin after progressing on doxil. She is receiving taxol days 1-8-`5 and avastin days 1 and 15 every 28 days, due day 1 cycle 2 on 04-09-15. She has not needed gCSF with this regimen.  Patient is tolerating this treatment generally well and is minimally symptomatic from the progressive gyn cancer. She has some minimal fatigue manageable with occasional adjustments in activity. She denies nausea and states bowels are moving well. She is trying to eat healthy foods, following suggestions from her son. The low back discomfort seems better when she increases activity. She denies any bleeding, LE swelling, abdominal swelling, increased SOB, fever or symptoms of infection. She has minimal, occasional abdominal discomfort. No significant peripheral neuropathy. Remainder of 10 point Review of Systems negative/ unchanged.  PAC in Genetics testing sent 03-02-15, normal by Breast Ovarian Panel by [facility] CA 125 was 1406 in 10-2013 and was 692 as baseline for weekly taxol avastin on 03-12-15 Flu vaccine 01-05-15  She enjoyed long visit from son and surprise visit from other family over holidays.  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 14cm adnexal mass.  Significant radiographic and chemical response to neoadjuvant platinum and taxane neoadjuvant chemotherapy x 4 cycles.  Interval cytoreduction to microscopic disease on 01/21/14. Pathology was most consistent with ovarian primary.     Endometrial cancer determined by uterine biopsy ([place]) (Resolved)   09/16/2013 Initial Diagnosis Endometrial cancer determined by uterine biopsy, and endocervical biopsy    Ovarian  cancer ([place])   09/23/2013 Initial Diagnosis Ovarian cancer   10/03/2013 - 01/04/2014 Neo-Adjuvant Chemotherapy dose dense paclitaxel and carboplatin x 4 cycles   01/21/2014 Surgery robotic hysterectomy, BSO, omentectomy, interval cytoreduction to microscopic disease  Patient had acute abdominal pain Dec 2014, which resolved without medical attention, then vague diffuse abdominal discomfort, low grade nausea and abdominal bloating. She was seen in June 2015 for first gyn exam in years, PAP with AGUS and follow up biopsies of endocervix and endometrium by Dr Pamala Hurry both with serous apparent endometrial cancer (pathology from [facility] 09-12-13, case # [id number] to be scanned into this EMR). Biopsy of cervix had normal squamous epithelium. CA125 from [facility] 09-16-13 was 964, and was up to 1406 by 10-23-13.Marland Kitchen She was seen by Dr Denman George on 09-23-13, her exam remarkable for large pelvic mass minimally mobile and extending to umbilicus. She had CT CAP 09-26-13 had prominent but not enlarged juxtapericardiac lymph nodes, trace right pleural effusion, large amount of abnormal soft tissue thruout peritoneal cavity, predominately with omentum and especially LUQ, largest area 3.8 x 3.0 x 4.0 cm anterior to proximal descending colon, implants adjacent to liver, large complex multicystic lesion in pelvis inseperable from uterus and adnexae, with mass effect on bladder, questionable area in central aspect of dome of liver, borderline enlarged retroperitoneal nodes. Due to extent of disease, treatment began with chemotherapy. Cycle one taxol carboplatin was given on 9-32-35, complicated by severe nausea/vomiting, constipation and severe taxol aches. Cycle 2 was changed to dose dense carbo taxol, day 1 on 11-01-13. She needed gCSF support by cycle 3. Neoadjuvant chemo was one cycle carbo taxol standard dose and 3 cycles dose dense. 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 She resumed chemo with cycle 4 on 02-21-14 and completed cycle 6 on 04-25-14. She had unremarkable exam by Dr Denman George 08-11-14, however increase in CA 125 marker then, from 8 in 05-2014 to 26 in 08-2014, and repeat 63 on 09-08-14. CT AP 09-16-14 shows progressive peritoneal metastases, increasing retroperitoneal and transverse mesocolon nodes and soft tissue area at vaginal cuff stable to slightly decreased. She began doxil on 10-10-14, CA 125 151 that day as baseline for doxil. She had 4 cycles of doxil thru 01-09-15, then CT 02-11-15 showed mixed response but with multiple new liver metastases. CA 125 on 02-02-15 was 299. She began weekly taxol with avastin 03-12-15.     Objective:  Vital signs in last 24 hours:  BP 145/83 mmHg  Pulse 90  Temp(Src) 97.6 F (36.4 C)  Resp 18  Ht 5' 4"  (1.626 m)  Wt 236 lb 11.2 oz (107.366 kg)  BMI 40.61 kg/m2  SpO2 100% Weight up 6.5 lbs Alert, oriented and appropriate. Ambulatory without assistance, able to get on and off exam table with some assistance.  No alopecia  HEENT:PERRL, sclerae not icteric. Oral mucosa moist without lesions, posterior pharynx clear.  Neck supple. No JVD.  Lymphatics:no cervical,supraclavicular or inguinal adenopathy Resp: clear to auscultation bilaterally and normal percussion bilaterally Cardio: regular rate and rhythm. No gallop. GI: abdomen obese, soft, nontender, not distended, no mass or organomegaly. Normally active bowel sounds. Surgical incision not remarkable. Musculoskeletal/ Extremities: without pitting edema, cords, tenderness. Spine not tender. Neuro: no change peripheral neuropathy. Otherwise nonfocal Skin without rash, ecchymosis, petechiae. Hyperpigmentation from doxil improving. Portacath-without erythema or tenderness  Lab Results:  Results for orders placed or performed in visit on 04/07/15  CBC with Differential  Result Value Ref Range    WBC 4.4 3.9 - 10.3 10e3/uL   NEUT# 2.2 1.5 - 6.5 10e3/uL   HGB 11.2 (L) 11.6 - 15.9 g/dL   HCT 35.4 34.8 - 46.6 %   Platelets 230 145 - 400 10e3/uL   MCV 82.8 79.5 - 101.0 fL   MCH 26.2 25.1 - 34.0 pg   MCHC 31.7 31.5 - 36.0 g/dL   RBC 4.28 3.70 - 5.45 10e6/uL   RDW 17.7 (H) 11.2 - 14.5 %   lymph# 1.7 0.9 - 3.3 10e3/uL   MONO# 0.5 0.1 - 0.9 10e3/uL   Eosinophils Absolute 0.0 0.0 - 0.5 10e3/uL   Basophils Absolute 0.0 0.0 - 0.1 10e3/uL   NEUT% 49.7 38.4 - 76.8 %   LYMPH% 37.4 14.0 - 49.7 %   MONO% 12.0 0.0 - 14.0 %   EOS% 0.6 0.0 - 7.0 %   BASO% 0.3 0.0 - 2.0 %  Comprehensive metabolic panel  Result Value Ref Range   Sodium 141 136 - 145 mEq/L   Potassium 3.8 3.5 - 5.1 mEq/L   Chloride 109 98 - 109 mEq/L   CO2 23 22 - 29 mEq/L   Glucose 89 70 - 140 mg/dl   BUN 12.5 7.0 - 26.0 mg/dL   Creatinine 0.8 0.6 - 1.1 mg/dL   Total Bilirubin <0.30 0.20 - 1.20 mg/dL   Alkaline Phosphatase 104 40 - 150 U/L   AST 17 5 - 34 U/L   ALT 12 0 - 55 U/L   Total Protein 7.1 6.4 - 8.3 g/dL   Albumin 3.4 (L) 3.5 - 5.0 g/dL   Calcium 9.3 8.4 - 10.4 mg/dL  Anion Gap 8 3 - 11 mEq/L   EGFR >90 >90 ml/min/1.73 m2  Urine protein by dipstick - [facility]  Result Value Ref Range   Protein, ur Negative Negative- <30 mg/dL     Studies/Results:  No results found.  Medications: I have reviewed the patient's current medications.  DISCUSSION All of interval history reviewed. Patient has asked how many cycles of treatment are expected, and we have again discussed that length of treatment for recurrent disease depends on response and tolerance to the regimen. As long as she is clinically ok, and if marker stabilizes or improves over next several weeks, would do 3 to 4 full cycles prior to considering repeat imaging. Niece understands this information and patient seems also to understand. They know that I am glad to speak with her son by phone if he would like. Niece requests MD and treatment on same day if  possible.  Assessment/Plan:  1. 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 Recurrent disease found by increasing CA 125 initially 08-2014 . Mixed response to 4 cycles of doxil thru 01-09-15, but multiple new liver mets. Now trying weekly taxol days 1,8,15 every 28 days with avastin days 1 and 15. She will have cycle 2 on 1-5 (taxol + avastin), 1-12 (taxol) and see MD with treatment 1-19 (taxol + avastin) as long as ANC >=1.5 and plt >=100k for each chemo. 2.Genetics testing no mutation identified. 3 PAC in 4.GI symptoms multifactorial, seem improved with regular laxatives and H2 blocker. 5.Anemia previously: iron deficiency and chemo. Hgb still good at 11.6 Continue po iron for now. 6.neuropathy symptoms from taxol feet>hands essentially resolved after initial taxol, following now back on that drug. 7.does not have Advance Directives per EMR 8.flu vaccine 01-05-15 9.EF good by echocardiogram prior to #4 doxil 10.morbid obesity, BMI 39. She and son are trying to improve her diet 11.intermittent low back discomfort not new, better when not so sedentary  All questions answered and she knows to call if concerns prior to next scheduled visit. Chemo and avastin orders confirmed. Time spent 25 min including >50% counseling and coordination of care.    Monike Bragdon P, MD   04/08/2015, 1:27 PM

## 2015-04-07 NOTE — Patient Instructions (Signed)

## 2015-04-08 ENCOUNTER — Telehealth: Payer: Self-pay | Admitting: Emergency Medicine

## 2015-04-08 ENCOUNTER — Telehealth: Payer: Self-pay | Admitting: Oncology

## 2015-04-08 ENCOUNTER — Telehealth: Payer: Self-pay

## 2015-04-08 LAB — CA 125: CA 125: 294 U/mL — AB (ref <35)

## 2015-04-08 NOTE — Telephone Encounter (Signed)
Pt called stating she cannot find her dexamethasone for prior to chemo. Called back and lvm to find out if we e-scribe it to [facility] or [facility] outpatient pharmacy.

## 2015-04-08 NOTE — Telephone Encounter (Signed)
Appointments made and patient will get a new avs at 1/5 chemo

## 2015-04-08 NOTE — Telephone Encounter (Signed)
S/w pt and she got a refill at her pharmacy. Confirmed to take dexamethasone 12 hours before chemo.

## 2015-04-09 ENCOUNTER — Ambulatory Visit (HOSPITAL_BASED_OUTPATIENT_CLINIC_OR_DEPARTMENT_OTHER): Payer: Medicare Other

## 2015-04-09 VITALS — BP 141/97 | HR 97 | Temp 97.9°F | Resp 18

## 2015-04-09 DIAGNOSIS — C569 Malignant neoplasm of unspecified ovary: Secondary | ICD-10-CM | POA: Diagnosis not present

## 2015-04-09 DIAGNOSIS — Z5111 Encounter for antineoplastic chemotherapy: Secondary | ICD-10-CM

## 2015-04-09 DIAGNOSIS — Z5112 Encounter for antineoplastic immunotherapy: Secondary | ICD-10-CM

## 2015-04-09 MED ORDER — DIPHENHYDRAMINE HCL 50 MG/ML IJ SOLN
50.0000 mg | Freq: Once | INTRAMUSCULAR | Status: AC
  Administered 2015-04-09: 50 mg via INTRAVENOUS

## 2015-04-09 MED ORDER — PACLITAXEL CHEMO INJECTION 300 MG/50ML
80.0000 mg/m2 | Freq: Once | INTRAVENOUS | Status: AC
  Administered 2015-04-09: 174 mg via INTRAVENOUS
  Filled 2015-04-09: qty 29

## 2015-04-09 MED ORDER — FAMOTIDINE IN NACL 20-0.9 MG/50ML-% IV SOLN
INTRAVENOUS | Status: AC
  Filled 2015-04-09: qty 50

## 2015-04-09 MED ORDER — SODIUM CHLORIDE 0.9 % IV SOLN
Freq: Once | INTRAVENOUS | Status: AC
  Administered 2015-04-09: 12:00:00 via INTRAVENOUS

## 2015-04-09 MED ORDER — HEPARIN SOD (PORK) LOCK FLUSH 100 UNIT/ML IV SOLN
500.0000 [IU] | Freq: Once | INTRAVENOUS | Status: AC | PRN
  Administered 2015-04-09: 500 [IU]
  Filled 2015-04-09: qty 5

## 2015-04-09 MED ORDER — SODIUM CHLORIDE 0.9 % IJ SOLN
10.0000 mL | INTRAMUSCULAR | Status: DC | PRN
  Administered 2015-04-09: 10 mL
  Filled 2015-04-09: qty 10

## 2015-04-09 MED ORDER — DIPHENHYDRAMINE HCL 50 MG/ML IJ SOLN
INTRAMUSCULAR | Status: AC
Start: 2015-04-09 — End: 2015-04-09
  Filled 2015-04-09: qty 1

## 2015-04-09 MED ORDER — SODIUM CHLORIDE 0.9 % IV SOLN
10.2000 mg/kg | Freq: Once | INTRAVENOUS | Status: AC
  Administered 2015-04-09: 1100 mg via INTRAVENOUS
  Filled 2015-04-09: qty 36

## 2015-04-09 MED ORDER — SODIUM CHLORIDE 0.9 % IV SOLN
Freq: Once | INTRAVENOUS | Status: AC
  Administered 2015-04-09: 12:00:00 via INTRAVENOUS
  Filled 2015-04-09: qty 8

## 2015-04-09 MED ORDER — FAMOTIDINE IN NACL 20-0.9 MG/50ML-% IV SOLN
20.0000 mg | Freq: Once | INTRAVENOUS | Status: AC
Start: 2015-04-09 — End: 2015-04-09
  Administered 2015-04-09: 20 mg via INTRAVENOUS

## 2015-04-09 NOTE — Patient Instructions (Signed)
[facility] Discharge Instructions for Patients Receiving Chemotherapy  Today you received the following chemotherapy agents:  Avastin and Taxol  To help prevent nausea and vomiting after your treatment, we encourage you to take your nausea medication as ordered per MD.   If you develop nausea and vomiting that is not controlled by your nausea medication, call the clinic.   BELOW ARE SYMPTOMS THAT SHOULD BE REPORTED IMMEDIATELY:  *FEVER GREATER THAN 100.5 F  *CHILLS WITH OR WITHOUT FEVER  NAUSEA AND VOMITING THAT IS NOT CONTROLLED WITH YOUR NAUSEA MEDICATION  *UNUSUAL SHORTNESS OF BREATH  *UNUSUAL BRUISING OR BLEEDING  TENDERNESS IN MOUTH AND THROAT WITH OR WITHOUT PRESENCE OF ULCERS  *URINARY PROBLEMS  *BOWEL PROBLEMS  UNUSUAL RASH Items with * indicate a potential emergency and should be followed up as soon as possible.  Feel free to call the clinic you have any questions or concerns. The clinic phone number is [phone].  Please show the [place] at check-in to the Emergency Department and triage nurse.  Bevacizumab injection What is this medicine? BEVACIZUMAB (be va SIZ yoo mab) is a monoclonal antibody. It is used to treat cervical cancer, colorectal cancer, glioblastoma multiforme, non-small cell lung cancer (NSCLC), ovarian cancer, and renal cell cancer. This medicine may be used for other purposes; ask your health care provider or pharmacist if you have questions. What should I tell my health care provider before I take this medicine? They need to know if you have any of these conditions: -blood clots -heart disease, including heart failure, heart attack, or chest pain (angina) -high blood pressure -infection (especially a virus infection such as chickenpox, cold sores, or herpes) -kidney disease -lung disease -prior chemotherapy with doxorubicin, daunorubicin, epirubicin, or other anthracycline type chemotherapy agents -recent or  ongoing radiation therapy -recent surgery -stroke -an unusual or allergic reaction to bevacizumab, hamster proteins, mouse proteins, other medicines, foods, dyes, or preservatives -pregnant or trying to get pregnant -breast-feeding How should I use this medicine? This medicine is for infusion into a vein. It is given by a health care professional in a hospital or clinic setting. Talk to your pediatrician regarding the use of this medicine in children. Special care may be needed. Overdosage: If you think you have taken too much of this medicine contact a poison control center or emergency room at once. NOTE: This medicine is only for you. Do not share this medicine with others. What if I miss a dose? It is important not to miss your dose. Call your doctor or health care professional if you are unable to keep an appointment. What may interact with this medicine? Interactions are not expected. This list may not describe all possible interactions. Give your health care provider a list of all the medicines, herbs, non-prescription drugs, or dietary supplements you use. Also tell them if you smoke, drink alcohol, or use illegal drugs. Some items may interact with your medicine. What should I watch for while using this medicine? Your condition will be monitored carefully while you are receiving this medicine. You will need important blood work and urine testing done while you are taking this medicine. During your treatment, let your health care professional know if you have any unusual symptoms, such as difficulty breathing. This medicine may rarely cause 'gastrointestinal perforation' (holes in the stomach, intestines or colon), a serious side effect requiring surgery to repair. This medicine should be started at least 28 days following major surgery and the site of the  surgery should be totally healed. Check with your doctor before scheduling dental work or surgery while you are receiving this  treatment. Talk to your doctor if you have recently had surgery or if you have a wound that has not healed. Do not become pregnant while taking this medicine or for 6 months after stopping it. Women should inform their doctor if they wish to become pregnant or think they might be pregnant. There is a potential for serious side effects to an unborn child. Talk to your health care professional or pharmacist for more information. Do not breast-feed an infant while taking this medicine. This medicine has caused ovarian failure in some women. This medicine may interfere with the ability to have a child. You should talk to your doctor or health care professional if you are concerned about your fertility. What side effects may I notice from receiving this medicine? Side effects that you should report to your doctor or health care professional as soon as possible: -allergic reactions like skin rash, itching or hives, swelling of the face, lips, or tongue -signs of infection - fever or chills, cough, sore throat, pain or trouble passing urine -signs of decreased platelets or bleeding - bruising, pinpoint red spots on the skin, black, tarry stools, nosebleeds, blood in the urine -breathing problems -changes in vision -chest pain -confusion -jaw pain, especially after dental work -mouth sores -seizures -severe abdominal pain -severe headache -sudden numbness or weakness of the face, arm or leg -swelling of legs or ankles -symptoms of a stroke: change in mental awareness, inability to talk or move one side of the body (especially in patients with lung cancer) -trouble passing urine or change in the amount of urine -trouble speaking or understanding -trouble walking, dizziness, loss of balance or coordination Side effects that usually do not require medical attention (report to your doctor or health care professional if they continue or are bothersome): -constipation -diarrhea -dry  skin -headache -loss of appetite -nausea, vomiting This list may not describe all possible side effects. Call your doctor for medical advice about side effects. You may report side effects to FDA at 1-800-FDA-1088. Where should I keep my medicine? This drug is given in a hospital or clinic and will not be stored at home. NOTE: This sheet is a summary. It may not cover all possible information. If you have questions about this medicine, talk to your doctor, pharmacist, or health care provider.    2016, [employer]. (2014-05-20 16:58:44)

## 2015-04-16 ENCOUNTER — Other Ambulatory Visit: Payer: Medicare Other

## 2015-04-16 ENCOUNTER — Ambulatory Visit (HOSPITAL_BASED_OUTPATIENT_CLINIC_OR_DEPARTMENT_OTHER): Payer: Medicare Other

## 2015-04-16 ENCOUNTER — Other Ambulatory Visit (HOSPITAL_BASED_OUTPATIENT_CLINIC_OR_DEPARTMENT_OTHER): Payer: Medicare Other

## 2015-04-16 ENCOUNTER — Ambulatory Visit: Payer: Medicare Other

## 2015-04-16 VITALS — BP 97/55 | HR 83 | Temp 97.2°F | Resp 16

## 2015-04-16 DIAGNOSIS — C569 Malignant neoplasm of unspecified ovary: Secondary | ICD-10-CM

## 2015-04-16 DIAGNOSIS — Z5111 Encounter for antineoplastic chemotherapy: Secondary | ICD-10-CM | POA: Diagnosis not present

## 2015-04-16 DIAGNOSIS — Z95828 Presence of other vascular implants and grafts: Secondary | ICD-10-CM

## 2015-04-16 LAB — COMPREHENSIVE METABOLIC PANEL
ALT: 12 U/L (ref 0–55)
AST: 17 U/L (ref 5–34)
Albumin: 3.6 g/dL (ref 3.5–5.0)
Alkaline Phosphatase: 106 U/L (ref 40–150)
Anion Gap: 12 mEq/L — ABNORMAL HIGH (ref 3–11)
BUN: 18.7 mg/dL (ref 7.0–26.0)
CALCIUM: 9.9 mg/dL (ref 8.4–10.4)
CHLORIDE: 106 meq/L (ref 98–109)
CO2: 21 meq/L — AB (ref 22–29)
CREATININE: 0.9 mg/dL (ref 0.6–1.1)
EGFR: 82 mL/min/{1.73_m2} — ABNORMAL LOW (ref >90)
Glucose: 119 mg/dl (ref 70–140)
Potassium: 4.6 mEq/L (ref 3.5–5.1)
Sodium: 138 mEq/L (ref 136–145)
Total Bilirubin: <0.3 mg/dL (ref 0.20–1.20)
Total Protein: 8 g/dL (ref 6.4–8.3)

## 2015-04-16 LAB — CBC WITH DIFFERENTIAL/PLATELET
BASO%: 0.5 % (ref 0.0–2.0)
Basophils Absolute: 0 10*3/uL (ref 0.0–0.1)
EOS%: 0.1 % (ref 0.0–7.0)
Eosinophils Absolute: 0 10*3/uL (ref 0.0–0.5)
HEMATOCRIT: 38.5 % (ref 34.8–46.6)
HGB: 12.3 g/dL (ref 11.6–15.9)
LYMPH#: 0.9 10*3/uL (ref 0.9–3.3)
LYMPH%: 9.6 % — ABNORMAL LOW (ref 14.0–49.7)
MCH: 26.2 pg (ref 25.1–34.0)
MCHC: 32 g/dL (ref 31.5–36.0)
MCV: 81.8 fL (ref 79.5–101.0)
MONO#: 0.1 10*3/uL (ref 0.1–0.9)
MONO%: 1 % (ref 0.0–14.0)
NEUT#: 8.2 10*3/uL — ABNORMAL HIGH (ref 1.5–6.5)
NEUT%: 88.8 % — AB (ref 38.4–76.8)
Platelets: 255 10*3/uL (ref 145–400)
RBC: 4.71 10*6/uL (ref 3.70–5.45)
RDW: 17.2 % — AB (ref 11.2–14.5)
WBC: 9.3 10*3/uL (ref 3.9–10.3)

## 2015-04-16 MED ORDER — PACLITAXEL CHEMO INJECTION 300 MG/50ML
80.0000 mg/m2 | Freq: Once | INTRAVENOUS | Status: AC
  Administered 2015-04-16: 174 mg via INTRAVENOUS
  Filled 2015-04-16: qty 29

## 2015-04-16 MED ORDER — DIPHENHYDRAMINE HCL 50 MG/ML IJ SOLN
INTRAMUSCULAR | Status: AC
  Filled 2015-04-16: qty 1

## 2015-04-16 MED ORDER — SODIUM CHLORIDE 0.9 % IJ SOLN
10.0000 mL | INTRAMUSCULAR | Status: DC | PRN
  Administered 2015-04-16: 10 mL via INTRAVENOUS
  Filled 2015-04-16: qty 10

## 2015-04-16 MED ORDER — HEPARIN SOD (PORK) LOCK FLUSH 100 UNIT/ML IV SOLN
500.0000 [IU] | Freq: Once | INTRAVENOUS | Status: AC
  Administered 2015-04-16: 500 [IU] via INTRAVENOUS
  Filled 2015-04-16: qty 5

## 2015-04-16 MED ORDER — SODIUM CHLORIDE 0.9 % IV SOLN
Freq: Once | INTRAVENOUS | Status: AC
  Administered 2015-04-16: 11:00:00 via INTRAVENOUS

## 2015-04-16 MED ORDER — FAMOTIDINE IN NACL 20-0.9 MG/50ML-% IV SOLN
INTRAVENOUS | Status: AC
  Filled 2015-04-16: qty 50

## 2015-04-16 MED ORDER — FAMOTIDINE IN NACL 20-0.9 MG/50ML-% IV SOLN
20.0000 mg | Freq: Once | INTRAVENOUS | Status: AC
  Administered 2015-04-16: 20 mg via INTRAVENOUS

## 2015-04-16 MED ORDER — SODIUM CHLORIDE 0.9 % IV SOLN
Freq: Once | INTRAVENOUS | Status: AC
  Administered 2015-04-16: 12:00:00 via INTRAVENOUS
  Filled 2015-04-16: qty 8

## 2015-04-16 MED ORDER — SODIUM CHLORIDE 0.9 % IJ SOLN
10.0000 mL | INTRAMUSCULAR | Status: DC | PRN
  Administered 2015-04-16: 10 mL
  Filled 2015-04-16: qty 10

## 2015-04-16 MED ORDER — DIPHENHYDRAMINE HCL 50 MG/ML IJ SOLN
50.0000 mg | Freq: Once | INTRAMUSCULAR | Status: AC
  Administered 2015-04-16: 50 mg via INTRAVENOUS

## 2015-04-16 MED ORDER — HEPARIN SOD (PORK) LOCK FLUSH 100 UNIT/ML IV SOLN
500.0000 [IU] | Freq: Once | INTRAVENOUS | Status: AC | PRN
  Administered 2015-04-16: 500 [IU]
  Filled 2015-04-16: qty 5

## 2015-04-16 NOTE — Patient Instructions (Signed)
Paclitaxel injection What is this medicine? PACLITAXEL (PAK li TAX el) is a chemotherapy drug. It targets fast dividing cells, like cancer cells, and causes these cells to die. This medicine is used to treat ovarian cancer, breast cancer, and other cancers. This medicine may be used for other purposes; ask your health care provider or pharmacist if you have questions. What should I tell my health care provider before I take this medicine? They need to know if you have any of these conditions: -blood disorders -irregular heartbeat -infection (especially a virus infection such as chickenpox, cold sores, or herpes) -liver disease -previous or ongoing radiation therapy -an unusual or allergic reaction to paclitaxel, alcohol, polyoxyethylated castor oil, other chemotherapy agents, other medicines, foods, dyes, or preservatives -pregnant or trying to get pregnant -breast-feeding How should I use this medicine? This drug is given as an infusion into a vein. It is administered in a hospital or clinic by a specially trained health care professional. Talk to your pediatrician regarding the use of this medicine in children. Special care may be needed. Overdosage: If you think you have taken too much of this medicine contact a poison control center or emergency room at once. NOTE: This medicine is only for you. Do not share this medicine with others. What if I miss a dose? It is important not to miss your dose. Call your doctor or health care professional if you are unable to keep an appointment. What may interact with this medicine? Do not take this medicine with any of the following medications: -disulfiram -metronidazole This medicine may also interact with the following medications: -cyclosporine -diazepam -ketoconazole -medicines to increase blood counts like filgrastim, pegfilgrastim, sargramostim -other chemotherapy drugs like cisplatin, doxorubicin, epirubicin, etoposide, teniposide,  vincristine -quinidine -testosterone -vaccines -verapamil Talk to your doctor or health care professional before taking any of these medicines: -acetaminophen -aspirin -ibuprofen -ketoprofen -naproxen This list may not describe all possible interactions. Give your health care provider a list of all the medicines, herbs, non-prescription drugs, or dietary supplements you use. Also tell them if you smoke, drink alcohol, or use illegal drugs. Some items may interact with your medicine. What should I watch for while using this medicine? Your condition will be monitored carefully while you are receiving this medicine. You will need important blood work done while you are taking this medicine. This drug may make you feel generally unwell. This is not uncommon, as chemotherapy can affect healthy cells as well as cancer cells. Report any side effects. Continue your course of treatment even though you feel ill unless your doctor tells you to stop. This medicine can cause serious allergic reactions. To reduce your risk you will need to take other medicine(s) before treatment with this medicine. In some cases, you may be given additional medicines to help with side effects. Follow all directions for their use. Call your doctor or health care professional for advice if you get a fever, chills or sore throat, or other symptoms of a cold or flu. Do not treat yourself. This drug decreases your body's ability to fight infections. Try to avoid being around people who are sick. This medicine may increase your risk to bruise or bleed. Call your doctor or health care professional if you notice any unusual bleeding. Be careful brushing and flossing your teeth or using a toothpick because you may get an infection or bleed more easily. If you have any dental work done, tell your dentist you are receiving this medicine. Avoid taking products that   contain aspirin, acetaminophen, ibuprofen, naproxen, or ketoprofen unless  instructed by your doctor. These medicines may hide a fever. Do not become pregnant while taking this medicine. Women should inform their doctor if they wish to become pregnant or think they might be pregnant. There is a potential for serious side effects to an unborn child. Talk to your health care professional or pharmacist for more information. Do not breast-feed an infant while taking this medicine. Men are advised not to father a child while receiving this medicine. This product may contain alcohol. Ask your pharmacist or healthcare provider if this medicine contains alcohol. Be sure to tell all healthcare providers you are taking this medicine. Certain medicines, like metronidazole and disulfiram, can cause an unpleasant reaction when taken with alcohol. The reaction includes flushing, headache, nausea, vomiting, sweating, and increased thirst. The reaction can last from 30 minutes to several hours. What side effects may I notice from receiving this medicine? Side effects that you should report to your doctor or health care professional as soon as possible: -allergic reactions like skin rash, itching or hives, swelling of the face, lips, or tongue -low blood counts - This drug may decrease the number of white blood cells, red blood cells and platelets. You may be at increased risk for infections and bleeding. -signs of infection - fever or chills, cough, sore throat, pain or difficulty passing urine -signs of decreased platelets or bleeding - bruising, pinpoint red spots on the skin, black, tarry stools, nosebleeds -signs of decreased red blood cells - unusually weak or tired, fainting spells, lightheadedness -breathing problems -chest pain -high or low blood pressure -mouth sores -nausea and vomiting -pain, swelling, redness or irritation at the injection site -pain, tingling, numbness in the hands or feet -slow or irregular heartbeat -swelling of the ankle, feet, hands Side effects that  usually do not require medical attention (report to your doctor or health care professional if they continue or are bothersome): -bone pain -complete hair loss including hair on your head, underarms, pubic hair, eyebrows, and eyelashes -changes in the color of fingernails -diarrhea -loosening of the fingernails -loss of appetite -muscle or joint pain -red flush to skin -sweating This list may not describe all possible side effects. Call your doctor for medical advice about side effects. You may report side effects to FDA at 1-800-FDA-1088. Where should I keep my medicine? This drug is given in a hospital or clinic and will not be stored at home. NOTE: This sheet is a summary. It may not cover all possible information. If you have questions about this medicine, talk to your doctor, pharmacist, or health care provider.    2016, Elsevier/Gold Standard. (2014-11-06 13:02:56)  

## 2015-04-19 ENCOUNTER — Other Ambulatory Visit: Payer: Self-pay | Admitting: Oncology

## 2015-04-23 ENCOUNTER — Ambulatory Visit (HOSPITAL_BASED_OUTPATIENT_CLINIC_OR_DEPARTMENT_OTHER): Payer: Medicare Other

## 2015-04-23 ENCOUNTER — Telehealth: Payer: Self-pay | Admitting: Oncology

## 2015-04-23 ENCOUNTER — Other Ambulatory Visit (HOSPITAL_BASED_OUTPATIENT_CLINIC_OR_DEPARTMENT_OTHER): Payer: Medicare Other

## 2015-04-23 ENCOUNTER — Ambulatory Visit (HOSPITAL_BASED_OUTPATIENT_CLINIC_OR_DEPARTMENT_OTHER): Payer: Medicare Other | Admitting: Oncology

## 2015-04-23 ENCOUNTER — Encounter: Payer: Self-pay | Admitting: Oncology

## 2015-04-23 ENCOUNTER — Ambulatory Visit: Payer: Medicare Other

## 2015-04-23 ENCOUNTER — Other Ambulatory Visit: Payer: Self-pay

## 2015-04-23 VITALS — BP 141/60 | HR 79 | Temp 97.7°F | Resp 18 | Ht 64.0 in | Wt 232.3 lb

## 2015-04-23 DIAGNOSIS — R300 Dysuria: Secondary | ICD-10-CM

## 2015-04-23 DIAGNOSIS — G620 Drug-induced polyneuropathy: Secondary | ICD-10-CM | POA: Diagnosis not present

## 2015-04-23 DIAGNOSIS — Z95828 Presence of other vascular implants and grafts: Secondary | ICD-10-CM

## 2015-04-23 DIAGNOSIS — C787 Secondary malignant neoplasm of liver and intrahepatic bile duct: Secondary | ICD-10-CM

## 2015-04-23 DIAGNOSIS — C562 Malignant neoplasm of left ovary: Secondary | ICD-10-CM

## 2015-04-23 DIAGNOSIS — T451X5A Adverse effect of antineoplastic and immunosuppressive drugs, initial encounter: Secondary | ICD-10-CM

## 2015-04-23 DIAGNOSIS — D509 Iron deficiency anemia, unspecified: Secondary | ICD-10-CM | POA: Diagnosis not present

## 2015-04-23 DIAGNOSIS — Z5112 Encounter for antineoplastic immunotherapy: Secondary | ICD-10-CM | POA: Diagnosis not present

## 2015-04-23 DIAGNOSIS — Z5111 Encounter for antineoplastic chemotherapy: Secondary | ICD-10-CM

## 2015-04-23 DIAGNOSIS — C569 Malignant neoplasm of unspecified ovary: Secondary | ICD-10-CM

## 2015-04-23 LAB — CBC WITH DIFFERENTIAL/PLATELET
BASO%: 0 % (ref 0.0–2.0)
BASOS ABS: 0 10*3/uL (ref 0.0–0.1)
EOS ABS: 0 10*3/uL (ref 0.0–0.5)
EOS%: 0 % (ref 0.0–7.0)
HEMATOCRIT: 38.4 % (ref 34.8–46.6)
HEMOGLOBIN: 12.3 g/dL (ref 11.6–15.9)
LYMPH#: 0.9 10*3/uL (ref 0.9–3.3)
LYMPH%: 20 % (ref 14.0–49.7)
MCH: 26.1 pg (ref 25.1–34.0)
MCHC: 32 g/dL (ref 31.5–36.0)
MCV: 81.5 fL (ref 79.5–101.0)
MONO#: 0.1 10*3/uL (ref 0.1–0.9)
MONO%: 1.3 % (ref 0.0–14.0)
NEUT#: 3.6 10*3/uL (ref 1.5–6.5)
NEUT%: 78.7 % — ABNORMAL HIGH (ref 38.4–76.8)
Platelets: 283 10*3/uL (ref 145–400)
RBC: 4.71 10*6/uL (ref 3.70–5.45)
RDW: 17.5 % — AB (ref 11.2–14.5)
WBC: 4.5 10*3/uL (ref 3.9–10.3)

## 2015-04-23 LAB — COMPREHENSIVE METABOLIC PANEL
ALT: 10 U/L (ref 0–55)
AST: 15 U/L (ref 5–34)
Albumin: 3.6 g/dL (ref 3.5–5.0)
Alkaline Phosphatase: 99 U/L (ref 40–150)
Anion Gap: 10 mEq/L (ref 3–11)
BUN: 15 mg/dL (ref 7.0–26.0)
CHLORIDE: 107 meq/L (ref 98–109)
CO2: 22 mEq/L (ref 22–29)
Calcium: 9.9 mg/dL (ref 8.4–10.4)
Creatinine: 0.9 mg/dL (ref 0.6–1.1)
EGFR: 78 mL/min/{1.73_m2} — ABNORMAL LOW (ref >90)
GLUCOSE: 131 mg/dL (ref 70–140)
POTASSIUM: 4.1 meq/L (ref 3.5–5.1)
SODIUM: 139 meq/L (ref 136–145)
Total Bilirubin: <0.3 mg/dL (ref 0.20–1.20)
Total Protein: 7.9 g/dL (ref 6.4–8.3)

## 2015-04-23 LAB — UA PROTEIN, DIPSTICK - CHCC: Protein, ur: NEGATIVE mg/dL

## 2015-04-23 MED ORDER — HEPARIN SOD (PORK) LOCK FLUSH 100 UNIT/ML IV SOLN
500.0000 [IU] | Freq: Once | INTRAVENOUS | Status: AC | PRN
  Administered 2015-04-23: 500 [IU]
  Filled 2015-04-23: qty 5

## 2015-04-23 MED ORDER — HEPARIN SOD (PORK) LOCK FLUSH 100 UNIT/ML IV SOLN
500.0000 [IU] | Freq: Once | INTRAVENOUS | Status: DC
  Filled 2015-04-23: qty 5

## 2015-04-23 MED ORDER — DIPHENHYDRAMINE HCL 50 MG/ML IJ SOLN
INTRAMUSCULAR | Status: AC
  Filled 2015-04-23: qty 1

## 2015-04-23 MED ORDER — FAMOTIDINE IN NACL 20-0.9 MG/50ML-% IV SOLN
20.0000 mg | Freq: Once | INTRAVENOUS | Status: AC
  Administered 2015-04-23: 20 mg via INTRAVENOUS

## 2015-04-23 MED ORDER — DIPHENHYDRAMINE HCL 50 MG/ML IJ SOLN
50.0000 mg | Freq: Once | INTRAMUSCULAR | Status: AC
  Administered 2015-04-23: 50 mg via INTRAVENOUS

## 2015-04-23 MED ORDER — SODIUM CHLORIDE 0.9 % IJ SOLN
10.0000 mL | INTRAMUSCULAR | Status: DC | PRN
  Administered 2015-04-23: 10 mL via INTRAVENOUS
  Filled 2015-04-23: qty 10

## 2015-04-23 MED ORDER — PACLITAXEL CHEMO INJECTION 300 MG/50ML
80.0000 mg/m2 | Freq: Once | INTRAVENOUS | Status: AC
  Administered 2015-04-23: 174 mg via INTRAVENOUS
  Filled 2015-04-23: qty 29

## 2015-04-23 MED ORDER — FAMOTIDINE IN NACL 20-0.9 MG/50ML-% IV SOLN
INTRAVENOUS | Status: AC
  Filled 2015-04-23: qty 50

## 2015-04-23 MED ORDER — SODIUM CHLORIDE 0.9 % IV SOLN
Freq: Once | INTRAVENOUS | Status: AC
  Administered 2015-04-23: 13:00:00 via INTRAVENOUS
  Filled 2015-04-23: qty 8

## 2015-04-23 MED ORDER — SODIUM CHLORIDE 0.9 % IV SOLN
Freq: Once | INTRAVENOUS | Status: AC
  Administered 2015-04-23: 13:00:00 via INTRAVENOUS

## 2015-04-23 MED ORDER — SODIUM CHLORIDE 0.9 % IJ SOLN
10.0000 mL | INTRAMUSCULAR | Status: DC | PRN
  Administered 2015-04-23: 10 mL
  Filled 2015-04-23: qty 10

## 2015-04-23 MED ORDER — SODIUM CHLORIDE 0.9 % IV SOLN
10.2000 mg/kg | Freq: Once | INTRAVENOUS | Status: AC
  Administered 2015-04-23: 1100 mg via INTRAVENOUS
  Filled 2015-04-23: qty 44

## 2015-04-23 NOTE — Progress Notes (Signed)
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 She is tolerating treatment well and is minimally symptomatic (if at all) from the ovarian cancer which progressed on previous doxil.  She is due day 15 cycle 2 today. Plan repeat CT after cycle 3.  Patient and niece tell me that she has generally felt well. She has some fatigue at times, but is able to go about usual activities including church and choir. She likes the highly nutritious smoothies that son has her drinking daily. She has some minimal discomfort with urination for past couple of days, no fever or hematuria, will check urine specimen today.She has minimal occasional blood from nose with blowing, no other bleeding. Bowels are moving well daily. No fever, no SOB or chest symptoms, no problems with PAC, no abdominal or pelvic pain, no LE swelling, no problems with PAC. Still no recurrence of peripheral neuropathy which was problem with initial taxol. Remainder of 10 point Review of Systems negative/ unchanged.   PAC in Genetics testing sent 03-02-15, normal by Breast Ovarian Panel by [facility] CA 125 was 1406 in 10-2013 and was 692 as baseline for weekly taxol avastin on 03-12-15 Flu vaccine 01-05-15  ONCOLOGIC HISTORY Patient had acute abdominal pain Dec 2014, which resolved without medical attention, then vague diffuse abdominal discomfort, low grade nausea and abdominal bloating. She was seen in June 2015 for first gyn exam in years, PAP with AGUS and follow up biopsies of endocervix and endometrium by Dr Pamala Hurry both with serous apparent endometrial cancer (pathology from [facility] 09-12-13, case # [phone] to be scanned into this EMR). Biopsy of  cervix had normal squamous epithelium. CA125 from [facility] 09-16-13 was 964, and was up to 1406 by 10-23-13.Marland Kitchen She was seen by Dr Denman George on 09-23-13, her exam remarkable for large pelvic mass minimally mobile and extending to umbilicus. She had CT CAP 09-26-13 had prominent but not enlarged juxtapericardiac lymph nodes, trace right pleural effusion, large amount of abnormal soft tissue thruout peritoneal cavity, predominately with omentum and especially LUQ, largest area 3.8 x 3.0 x 4.0 cm anterior to proximal descending colon, implants adjacent to liver, large complex multicystic lesion in pelvis inseperable from uterus and adnexae, with mass effect on bladder, questionable area in central aspect of dome of liver, borderline enlarged retroperitoneal nodes. Due to extent of disease, treatment began with chemotherapy. Cycle one taxol carboplatin was given on 09-27-92, complicated by severe nausea/vomiting, constipation and severe taxol aches. Cycle 2 was changed to dose dense carbo taxol, day 1 on 11-01-13. She needed gCSF support by cycle 3. Neoadjuvant chemo was one cycle carbo taxol standard dose and 3 cycles dose dense. 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 She resumed chemo with cycle 4 on 02-21-14 and completed cycle 6 on 04-25-14. She had unremarkable exam by Dr Denman George 08-11-14, however increase in CA 125 marker then, from 8 in 05-2014 to 26 in 08-2014, and repeat 63 on 09-08-14. CT AP 09-16-14 shows progressive peritoneal metastases, increasing retroperitoneal and transverse mesocolon nodes and soft tissue area at vaginal cuff stable to slightly decreased. She began doxil  on 10-10-14, CA 125 151 that day as baseline for doxil. She had 4 cycles of doxil thru 01-09-15, then CT 02-11-15 showed mixed response but with multiple new liver metastases. CA 125 on 02-02-15 was 299. She began weekly taxol  with avastin 03-12-15.  Objective:  Vital signs in last 24 hours:  BP 141/60 mmHg  Pulse 79  Temp(Src) 97.7 F (36.5 C) (Oral)  Resp 18  Ht _0  (1.626 m)  Wt 232 lb 4.8 oz (105.371 kg)  BMI 39.85 kg/m2  SpO2 99% Weight down 4 lbs, which she attributes to improvement in diet Alert, oriented and appropriate. Ambulatory without assistance. Looks comfortable, respirations not labored, talkative and in good spirits. Niece very supportive Alopecia  HEENT:PERRL, sclerae not icteric. Oral mucosa moist without lesions, posterior pharynx clear.  Neck supple. No JVD.  Lymphatics:no cervical,supraclavicular or appreciable inguinal adenopathy Resp: clear to auscultation bilaterally and normal percussion bilaterally Cardio: regular rate and rhythm. No gallop. GI: abdomen obese, soft, nontender, not distended, no appreciable mass or organomegaly. Normally active bowel sounds. Surgical incision not remarkable. Musculoskeletal/ Extremities: without pitting edema, cords, tenderness Neuro: no significant peripheral neuropathy. Otherwise nonfocal. PSYCH appropriate mood and affect (note on steroids for taxol) Skin without rash, ecchymosis, petechiae Portacath-without erythema or tenderness  Lab Results:  Results for orders placed or performed in visit on 04/23/15  CBC with Differential  Result Value Ref Range   WBC 4.5 3.9 - 10.3 10e3/uL   NEUT# 3.6 1.5 - 6.5 10e3/uL   HGB 12.3 11.6 - 15.9 g/dL   HCT 38.4 34.8 - 46.6 %   Platelets 283 145 - 400 10e3/uL   MCV 81.5 79.5 - 101.0 fL   MCH 26.1 25.1 - 34.0 pg   MCHC 32.0 31.5 - 36.0 g/dL   RBC 4.71 3.70 - 5.45 10e6/uL   RDW 17.5 (H) 11.2 - 14.5 %   lymph# 0.9 0.9 - 3.3 10e3/uL   MONO# 0.1 0.1 - 0.9 10e3/uL   Eosinophils Absolute 0.0 0.0 - 0.5 10e3/uL   Basophils Absolute 0.0 0.0 - 0.1 10e3/uL   NEUT% 78.7 (H) 38.4 - 76.8 %   LYMPH% 20.0 14.0 - 49.7 %   MONO% 1.3 0.0 - 14.0 %   EOS% 0.0 0.0 - 7.0 %   BASO% 0.0 0.0 - 2.0 %  Comprehensive  metabolic panel  Result Value Ref Range   Sodium 139 136 - 145 mEq/L   Potassium 4.1 3.5 - 5.1 mEq/L   Chloride 107 98 - 109 mEq/L   CO2 22 22 - 29 mEq/L   Glucose 131 70 - 140 mg/dl   BUN 15.0 7.0 - 26.0 mg/dL   Creatinine 0.9 0.6 - 1.1 mg/dL   Total Bilirubin <0.30 0.20 - 1.20 mg/dL   Alkaline Phosphatase 99 40 - 150 U/L   AST 15 5 - 34 U/L   ALT 10 0 - 55 U/L   Total Protein 7.9 6.4 - 8.3 g/dL   Albumin 3.6 3.5 - 5.0 g/dL   Calcium 9.9 8.4 - 10.4 mg/dL   Anion Gap 10 3 - 11 mEq/L   EGFR 78 (L) >90 ml/min/1.73 m2  Urine protein by dipstick - [facility]  Result Value Ref Range   Protein, ur Negative Negative- <30 mg/dL    Last CA 125 on 04-07-15 down to 294, this having been 692 on 03-12-15  UA C&S to be collected today.  Studies/Results:  No results found.  Medications: I have reviewed the patient's current medications. Continue bowel  program. Continue premedication decadron 12 hrs prior to taxol. Recommended saline nose spray multiple times daily to decrease tendency for epistaxis on avastin.  Niece requests diflucan in preference to other meds if yeast causing urinary symptoms.  DISCUSSION Patient is pleased that she is doing well and in agreement with continuing treatment as planned. We will repeat CT after cycle 3.  Patient has asked how long she will need PAC. I have tried again to explain that she will likely need to continue treatments on some regular or at least intermittent basis for the rest of her life, and I do not expect to remove the [facility] unless problems with the device. I have also told her clearly that I expect additional treatments after CT, even if these are not set up just yet. NOTE patent's question re PAC was in spite of very clear and direct conversations to this point on incurable nature of this cancer, with goal of treatments to control disease and related symptoms as best possible. Niece seems again to understand situation. Questions elicited and patient seems  to understand discussion.  Assessment/Plan: 1. 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 Recurrent disease found by increasing CA 125 initially 08-2014 . Mixed response to 4 cycles of doxil thru 01-09-15, but multiple new liver mets. Now trying weekly taxol days 1,8,15 every 28 days with avastin days 1 and 15. She will have day 15 cycle 2 taxol + avastin today, no treatment next week, then cycle 3 on 2-2, 2-9 and 2-16 as long as ANC >=1.5 and plt >=100k each and urine protein meets parameters. She will have CT AP ~ 2-24. MD visits coordinating with above as best possible; niece prefers visits on days of treatment rather than separate trips to office.  2.Genetics testing normal 3 PAC in 4.GI symptoms multifactorial, improved with regular laxatives and H2 blocker, and may be better also with some response to present chemo avastin regimen 5.Anemia previously: iron deficiency and chemo. Hgb still good at 11.6 Continue po iron for now. 6.neuropathy symptoms from taxol feet>hands essentially resolved after initial taxol, following now back on that drug. 7.does not have Advance Directives per EMR 8.flu vaccine 01-05-15 9.EF good by echocardiogram prior to #4 doxil 10.morbid obesity, BMI 40. She and son are trying to improve her diet 11.intermittent low back discomfort not new, better when not so sedentary.  12.minimal urinary discomfort: check UA   C&S.   Time spent 25 min including >50% counseling and coordination of care. Chemo and avastin orders confirmed. Patient knows to call prior to scheduled visits if needed.   LIVESAY,LENNIS P, MD   04/25/2015, 10:22 AM

## 2015-04-23 NOTE — Patient Instructions (Signed)
[place] Discharge Instructions for Patients Receiving Chemotherapy  Today you received the following chemotherapy agents Taxol and Avastin. To help prevent nausea and vomiting after your treatment, we encourage you to take your nausea medication Zofran 8 mg every 8 hours as needed.  If you develop nausea and vomiting that is not controlled by your nausea medication, call the clinic.   BELOW ARE SYMPTOMS THAT SHOULD BE REPORTED IMMEDIATELY:  *FEVER GREATER THAN 100.5 F  *CHILLS WITH OR WITHOUT FEVER  NAUSEA AND VOMITING THAT IS NOT CONTROLLED WITH YOUR NAUSEA MEDICATION  *UNUSUAL SHORTNESS OF BREATH  *UNUSUAL BRUISING OR BLEEDING  TENDERNESS IN MOUTH AND THROAT WITH OR WITHOUT PRESENCE OF ULCERS  *URINARY PROBLEMS  *BOWEL PROBLEMS  UNUSUAL RASH Items with * indicate a potential emergency and should be followed up as soon as possible.  Feel free to call the clinic you have any questions or concerns. The clinic phone number is [phone].  Please show the [place] at check-in to the Emergency Department and triage nurse.

## 2015-04-23 NOTE — Patient Instructions (Signed)

## 2015-04-23 NOTE — Telephone Encounter (Signed)
Appointments made and avs to be printed in chemo °

## 2015-04-28 ENCOUNTER — Telehealth: Payer: Self-pay

## 2015-04-28 NOTE — Telephone Encounter (Signed)
Spoke with Diane in [facility] lab and the urine was ordered after patient in lab for pre treatment labs.  Lab not aware U/A and C&S was ordered.

## 2015-04-28 NOTE — Telephone Encounter (Signed)
LM for Melissa Ball to call in the am to let Dr. Mariana Kaufman nurse know if she is experiencing any, burning, pain, frequency with urination.   If she is, will need for her to come in to give a urine sample.

## 2015-04-28 NOTE — Telephone Encounter (Signed)
-----   Message from Gordy Levan, MD sent at 04/25/2015 10:10 AM EST ----- I do not see any results from UA C&S that we thought was being done 1-19. If lab did not get specimen, and IF patient still has symptoms, would get it this week.  thanks

## 2015-04-29 NOTE — Telephone Encounter (Signed)
Ms. Melissa Ball returned call and stated that she does not have any urinary symptoms at this time.

## 2015-04-30 ENCOUNTER — Ambulatory Visit: Payer: Medicare Other

## 2015-05-07 ENCOUNTER — Ambulatory Visit: Payer: Medicare Other

## 2015-05-07 ENCOUNTER — Other Ambulatory Visit (HOSPITAL_BASED_OUTPATIENT_CLINIC_OR_DEPARTMENT_OTHER): Payer: Medicare Other

## 2015-05-07 ENCOUNTER — Ambulatory Visit (HOSPITAL_BASED_OUTPATIENT_CLINIC_OR_DEPARTMENT_OTHER): Payer: Medicare Other

## 2015-05-07 VITALS — BP 158/95 | HR 106 | Temp 97.2°F | Resp 18

## 2015-05-07 DIAGNOSIS — Z5112 Encounter for antineoplastic immunotherapy: Secondary | ICD-10-CM | POA: Diagnosis not present

## 2015-05-07 DIAGNOSIS — C569 Malignant neoplasm of unspecified ovary: Secondary | ICD-10-CM

## 2015-05-07 DIAGNOSIS — Z5111 Encounter for antineoplastic chemotherapy: Secondary | ICD-10-CM | POA: Diagnosis not present

## 2015-05-07 DIAGNOSIS — Z95828 Presence of other vascular implants and grafts: Secondary | ICD-10-CM

## 2015-05-07 LAB — CBC WITH DIFFERENTIAL/PLATELET
BASO%: 0.2 % (ref 0.0–2.0)
BASOS ABS: 0 10*3/uL (ref 0.0–0.1)
EOS ABS: 0 10*3/uL (ref 0.0–0.5)
EOS%: 0 % (ref 0.0–7.0)
HEMATOCRIT: 38.2 % (ref 34.8–46.6)
HGB: 12.5 g/dL (ref 11.6–15.9)
LYMPH%: 13.8 % — ABNORMAL LOW (ref 14.0–49.7)
MCH: 26.4 pg (ref 25.1–34.0)
MCHC: 32.7 g/dL (ref 31.5–36.0)
MCV: 80.6 fL (ref 79.5–101.0)
MONO#: 0.1 10*3/uL (ref 0.1–0.9)
MONO%: 0.8 % (ref 0.0–14.0)
NEUT%: 85.2 % — ABNORMAL HIGH (ref 38.4–76.8)
NEUTROS ABS: 5.6 10*3/uL (ref 1.5–6.5)
NRBC: 0 % (ref 0–0)
PLATELETS: 261 10*3/uL (ref 145–400)
RBC: 4.74 10*6/uL (ref 3.70–5.45)
RDW: 17.6 % — AB (ref 11.2–14.5)
WBC: 6.5 10*3/uL (ref 3.9–10.3)
lymph#: 0.9 10*3/uL (ref 0.9–3.3)

## 2015-05-07 LAB — COMPREHENSIVE METABOLIC PANEL
ALBUMIN: 3.6 g/dL (ref 3.5–5.0)
ALK PHOS: 104 U/L (ref 40–150)
ALT: 14 U/L (ref 0–55)
ANION GAP: 13 meq/L — AB (ref 3–11)
AST: 19 U/L (ref 5–34)
BUN: 15 mg/dL (ref 7.0–26.0)
CALCIUM: 9.8 mg/dL (ref 8.4–10.4)
CO2: 20 mEq/L — ABNORMAL LOW (ref 22–29)
CREATININE: 0.8 mg/dL (ref 0.6–1.1)
Chloride: 108 mEq/L (ref 98–109)
EGFR: 86 mL/min/{1.73_m2} — ABNORMAL LOW (ref >90)
Glucose: 121 mg/dl (ref 70–140)
Potassium: 4.1 mEq/L (ref 3.5–5.1)
Sodium: 141 mEq/L (ref 136–145)
TOTAL PROTEIN: 7.8 g/dL (ref 6.4–8.3)

## 2015-05-07 LAB — UA PROTEIN, DIPSTICK - CHCC

## 2015-05-07 MED ORDER — SODIUM CHLORIDE 0.9 % IV SOLN
Freq: Once | INTRAVENOUS | Status: AC
  Administered 2015-05-07: 10:00:00 via INTRAVENOUS

## 2015-05-07 MED ORDER — PACLITAXEL CHEMO INJECTION 300 MG/50ML
80.0000 mg/m2 | Freq: Once | INTRAVENOUS | Status: AC
  Administered 2015-05-07: 174 mg via INTRAVENOUS
  Filled 2015-05-07: qty 29

## 2015-05-07 MED ORDER — SODIUM CHLORIDE 0.9 % IJ SOLN
10.0000 mL | INTRAMUSCULAR | Status: DC | PRN
  Administered 2015-05-07: 10 mL
  Filled 2015-05-07: qty 10

## 2015-05-07 MED ORDER — DIPHENHYDRAMINE HCL 50 MG/ML IJ SOLN
50.0000 mg | Freq: Once | INTRAMUSCULAR | Status: AC
  Administered 2015-05-07: 50 mg via INTRAVENOUS

## 2015-05-07 MED ORDER — HEPARIN SOD (PORK) LOCK FLUSH 100 UNIT/ML IV SOLN
500.0000 [IU] | Freq: Once | INTRAVENOUS | Status: AC | PRN
  Administered 2015-05-07: 500 [IU]
  Filled 2015-05-07: qty 5

## 2015-05-07 MED ORDER — DIPHENHYDRAMINE HCL 50 MG/ML IJ SOLN
INTRAMUSCULAR | Status: AC
  Filled 2015-05-07: qty 1

## 2015-05-07 MED ORDER — FAMOTIDINE IN NACL 20-0.9 MG/50ML-% IV SOLN
INTRAVENOUS | Status: AC
  Filled 2015-05-07: qty 50

## 2015-05-07 MED ORDER — SODIUM CHLORIDE 0.9 % IV SOLN
10.2000 mg/kg | Freq: Once | INTRAVENOUS | Status: AC
  Administered 2015-05-07: 1100 mg via INTRAVENOUS
  Filled 2015-05-07: qty 44

## 2015-05-07 MED ORDER — SODIUM CHLORIDE 0.9% FLUSH
10.0000 mL | INTRAVENOUS | Status: DC | PRN
  Administered 2015-05-07: 10 mL via INTRAVENOUS
  Filled 2015-05-07: qty 10

## 2015-05-07 MED ORDER — SODIUM CHLORIDE 0.9 % IV SOLN
Freq: Once | INTRAVENOUS | Status: AC
  Administered 2015-05-07: 11:00:00 via INTRAVENOUS
  Filled 2015-05-07: qty 8

## 2015-05-07 MED ORDER — FAMOTIDINE IN NACL 20-0.9 MG/50ML-% IV SOLN
20.0000 mg | Freq: Once | INTRAVENOUS | Status: AC
  Administered 2015-05-07: 20 mg via INTRAVENOUS

## 2015-05-07 NOTE — Patient Instructions (Signed)
[place] Discharge Instructions for Patients Receiving Chemotherapy  Today you received the following chemotherapy agents Taxol and Avastin.  To help prevent nausea and vomiting after your treatment, we encourage you to take your nausea medication.   If you develop nausea and vomiting that is not controlled by your nausea medication, call the clinic.   BELOW ARE SYMPTOMS THAT SHOULD BE REPORTED IMMEDIATELY:  *FEVER GREATER THAN 100.5 F  *CHILLS WITH OR WITHOUT FEVER  NAUSEA AND VOMITING THAT IS NOT CONTROLLED WITH YOUR NAUSEA MEDICATION  *UNUSUAL SHORTNESS OF BREATH  *UNUSUAL BRUISING OR BLEEDING  TENDERNESS IN MOUTH AND THROAT WITH OR WITHOUT PRESENCE OF ULCERS  *URINARY PROBLEMS  *BOWEL PROBLEMS  UNUSUAL RASH Items with * indicate a potential emergency and should be followed up as soon as possible.  Feel free to call the clinic you have any questions or concerns. The clinic phone number is [phone].  Please show the [place] at check-in to the Emergency Department and triage nurse.

## 2015-05-13 ENCOUNTER — Other Ambulatory Visit: Payer: Self-pay | Admitting: Oncology

## 2015-05-14 ENCOUNTER — Ambulatory Visit (HOSPITAL_BASED_OUTPATIENT_CLINIC_OR_DEPARTMENT_OTHER): Payer: Medicare Other

## 2015-05-14 ENCOUNTER — Encounter: Payer: Self-pay | Admitting: Oncology

## 2015-05-14 ENCOUNTER — Ambulatory Visit (HOSPITAL_BASED_OUTPATIENT_CLINIC_OR_DEPARTMENT_OTHER): Payer: Medicare Other | Admitting: Oncology

## 2015-05-14 ENCOUNTER — Ambulatory Visit: Payer: Medicare Other

## 2015-05-14 ENCOUNTER — Other Ambulatory Visit: Payer: Medicare Other

## 2015-05-14 ENCOUNTER — Other Ambulatory Visit (HOSPITAL_BASED_OUTPATIENT_CLINIC_OR_DEPARTMENT_OTHER): Payer: Medicare Other

## 2015-05-14 VITALS — BP 154/98 | HR 105 | Temp 97.5°F | Resp 19 | Ht 64.0 in | Wt 233.6 lb

## 2015-05-14 DIAGNOSIS — C562 Malignant neoplasm of left ovary: Secondary | ICD-10-CM | POA: Diagnosis not present

## 2015-05-14 DIAGNOSIS — C787 Secondary malignant neoplasm of liver and intrahepatic bile duct: Secondary | ICD-10-CM

## 2015-05-14 DIAGNOSIS — Z5111 Encounter for antineoplastic chemotherapy: Secondary | ICD-10-CM

## 2015-05-14 DIAGNOSIS — G620 Drug-induced polyneuropathy: Secondary | ICD-10-CM

## 2015-05-14 DIAGNOSIS — C569 Malignant neoplasm of unspecified ovary: Secondary | ICD-10-CM

## 2015-05-14 DIAGNOSIS — Z95828 Presence of other vascular implants and grafts: Secondary | ICD-10-CM

## 2015-05-14 LAB — COMPREHENSIVE METABOLIC PANEL
ALT: 15 U/L (ref 0–55)
AST: 16 U/L (ref 5–34)
Albumin: 3.5 g/dL (ref 3.5–5.0)
Alkaline Phosphatase: 92 U/L (ref 40–150)
Anion Gap: 11 mEq/L (ref 3–11)
BUN: 15.4 mg/dL (ref 7.0–26.0)
CO2: 21 meq/L — AB (ref 22–29)
Calcium: 9.9 mg/dL (ref 8.4–10.4)
Chloride: 108 mEq/L (ref 98–109)
Creatinine: 0.8 mg/dL (ref 0.6–1.1)
GLUCOSE: 121 mg/dL (ref 70–140)
Potassium: 4.3 mEq/L (ref 3.5–5.1)
SODIUM: 140 meq/L (ref 136–145)
TOTAL PROTEIN: 7.5 g/dL (ref 6.4–8.3)

## 2015-05-14 LAB — CBC WITH DIFFERENTIAL/PLATELET
BASO%: 0.2 % (ref 0.0–2.0)
Basophils Absolute: 0 10*3/uL (ref 0.0–0.1)
EOS%: 0 % (ref 0.0–7.0)
Eosinophils Absolute: 0 10*3/uL (ref 0.0–0.5)
HCT: 38.8 % (ref 34.8–46.6)
HGB: 12.3 g/dL (ref 11.6–15.9)
LYMPH%: 11.5 % — AB (ref 14.0–49.7)
MCH: 26 pg (ref 25.1–34.0)
MCHC: 31.6 g/dL (ref 31.5–36.0)
MCV: 82.4 fL (ref 79.5–101.0)
MONO#: 0.1 10*3/uL (ref 0.1–0.9)
MONO%: 1.1 % (ref 0.0–14.0)
NEUT%: 87.2 % — ABNORMAL HIGH (ref 38.4–76.8)
NEUTROS ABS: 7.2 10*3/uL — AB (ref 1.5–6.5)
Platelets: 246 10*3/uL (ref 145–400)
RBC: 4.71 10*6/uL (ref 3.70–5.45)
RDW: 18.7 % — ABNORMAL HIGH (ref 11.2–14.5)
WBC: 8.3 10*3/uL (ref 3.9–10.3)
lymph#: 0.9 10*3/uL (ref 0.9–3.3)

## 2015-05-14 MED ORDER — FAMOTIDINE IN NACL 20-0.9 MG/50ML-% IV SOLN
INTRAVENOUS | Status: AC
  Filled 2015-05-14: qty 50

## 2015-05-14 MED ORDER — DIPHENHYDRAMINE HCL 50 MG/ML IJ SOLN
INTRAMUSCULAR | Status: AC
  Filled 2015-05-14: qty 1

## 2015-05-14 MED ORDER — SODIUM CHLORIDE 0.9 % IV SOLN
Freq: Once | INTRAVENOUS | Status: AC
  Administered 2015-05-14: 12:00:00 via INTRAVENOUS
  Filled 2015-05-14: qty 8

## 2015-05-14 MED ORDER — SODIUM CHLORIDE 0.9 % IJ SOLN
10.0000 mL | INTRAMUSCULAR | Status: DC | PRN
  Administered 2015-05-14: 10 mL
  Filled 2015-05-14: qty 10

## 2015-05-14 MED ORDER — SODIUM CHLORIDE 0.9 % IV SOLN
Freq: Once | INTRAVENOUS | Status: AC
  Administered 2015-05-14: 11:00:00 via INTRAVENOUS

## 2015-05-14 MED ORDER — SODIUM CHLORIDE 0.9% FLUSH
10.0000 mL | INTRAVENOUS | Status: DC | PRN
  Administered 2015-05-14: 10 mL via INTRAVENOUS
  Filled 2015-05-14: qty 10

## 2015-05-14 MED ORDER — HEPARIN SOD (PORK) LOCK FLUSH 100 UNIT/ML IV SOLN
500.0000 [IU] | Freq: Once | INTRAVENOUS | Status: AC | PRN
  Administered 2015-05-14: 500 [IU]
  Filled 2015-05-14: qty 5

## 2015-05-14 MED ORDER — FAMOTIDINE IN NACL 20-0.9 MG/50ML-% IV SOLN
20.0000 mg | Freq: Once | INTRAVENOUS | Status: AC
  Administered 2015-05-14: 20 mg via INTRAVENOUS

## 2015-05-14 MED ORDER — PACLITAXEL CHEMO INJECTION 300 MG/50ML
80.0000 mg/m2 | Freq: Once | INTRAVENOUS | Status: AC
  Administered 2015-05-14: 174 mg via INTRAVENOUS
  Filled 2015-05-14: qty 29

## 2015-05-14 MED ORDER — DIPHENHYDRAMINE HCL 50 MG/ML IJ SOLN
50.0000 mg | Freq: Once | INTRAMUSCULAR | Status: AC
  Administered 2015-05-14: 50 mg via INTRAVENOUS

## 2015-05-14 NOTE — Patient Instructions (Signed)
[facility] Discharge Instructions for Patients Receiving Chemotherapy  Today you received the following chemotherapy agents:  Taxol  To help prevent nausea and vomiting after your treatment, we encourage you to take your nausea medication as prescribed.   If you develop nausea and vomiting that is not controlled by your nausea medication, call the clinic.   BELOW ARE SYMPTOMS THAT SHOULD BE REPORTED IMMEDIATELY:  *FEVER GREATER THAN 100.5 F  *CHILLS WITH OR WITHOUT FEVER  NAUSEA AND VOMITING THAT IS NOT CONTROLLED WITH YOUR NAUSEA MEDICATION  *UNUSUAL SHORTNESS OF BREATH  *UNUSUAL BRUISING OR BLEEDING  TENDERNESS IN MOUTH AND THROAT WITH OR WITHOUT PRESENCE OF ULCERS  *URINARY PROBLEMS  *BOWEL PROBLEMS  UNUSUAL RASH Items with * indicate a potential emergency and should be followed up as soon as possible.  Feel free to call the clinic you have any questions or concerns. The clinic phone number is [phone].  Please show the CHEMO ALERT CARD at check-in to the Emergency Department and triage nurse.   

## 2015-05-14 NOTE — Progress Notes (Signed)
OFFICE PROGRESS NOTE   May 14, 2015   Physicians: Everitt Amber ,Biagio Borg, MD , Aloha Gell  INTERVAL HISTORY:  Patient is seen, alone for visit, in continuing attention to recurrent ovarian carcinoma, continuing treatment with taxol and avastin (taxol days 04-11-13 and avastin days 1-15 every 28 days). She will have day 8 cycle 3 today.  Last imaging CT CAP 02-11-15, with new liver metastases. Plan repeat CT 05-29-15 after present cycle 3.   Patient continues to tolerate this treatment well, and is not obviously symptomatic from the metastatic ovarian cancer. Appetite has been good, bowels moving well daily, no abdominal or pelvic pain, no nausea. No peripheral neuropathy. Only bleeding is slight from nose when blows. No LE swelling. Laryngitis is improved, now slight clear drainage from nose likely allergic and I have encouraged her to use claritin. No SOB. No fever. Remainder of 10 point Review of Systems negative/ unchanged.    PAC in Genetics testing sent 03-02-15, normal by Breast Ovarian Panel by [facility] CA 125 was 1406 in 10-2013 and was 692 as baseline for weekly taxol avastin on 03-12-15 Flu vaccine 01-05-15  She is looking forward to granddaughter's high school graduation in May.   ONCOLOGIC HISTORY Patient had acute abdominal pain Dec 2014, which resolved without medical attention, then vague diffuse abdominal discomfort, low grade nausea and abdominal bloating. She was seen in June 2015 for first gyn exam in years, PAP with AGUS and follow up biopsies of endocervix and endometrium by Dr Pamala Hurry both with serous apparent endometrial cancer (pathology from [facility] 09-12-13, case # [id number] to be scanned into this EMR). Biopsy of cervix had normal squamous epithelium. CA125 from [facility] 09-16-13 was 964, and was up to 1406 by 10-23-13.Marland Kitchen She was seen by Dr Denman George on 09-23-13, her exam remarkable for large pelvic mass minimally mobile and extending to umbilicus. She  had CT CAP 09-26-13 had prominent but not enlarged juxtapericardiac lymph nodes, trace right pleural effusion, large amount of abnormal soft tissue thruout peritoneal cavity, predominately with omentum and especially LUQ, largest area 3.8 x 3.0 x 4.0 cm anterior to proximal descending colon, implants adjacent to liver, large complex multicystic lesion in pelvis inseperable from uterus and adnexae, with mass effect on bladder, questionable area in central aspect of dome of liver, borderline enlarged retroperitoneal nodes. Due to extent of disease, treatment began with chemotherapy. Cycle one taxol carboplatin was given on 05-27-95, complicated by severe nausea/vomiting, constipation and severe taxol aches. Cycle 2 was changed to dose dense carbo taxol, day 1 on 11-01-13. She needed gCSF support by cycle 3. Neoadjuvant chemo was one cycle carbo taxol standard dose and 3 cycles dose dense. 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 She resumed chemo with cycle 4 on 02-21-14 and completed cycle 6 on 04-25-14. She had unremarkable exam by Dr Denman George 08-11-14, however increase in CA 125 marker then, from 8 in 05-2014 to 26 in 08-2014, and repeat 63 on 09-08-14. CT AP 09-16-14 shows progressive peritoneal metastases, increasing retroperitoneal and transverse mesocolon nodes and soft tissue area at vaginal cuff stable to slightly decreased. She began doxil on 10-10-14, CA 125 151 that day as baseline for doxil. She had 4 cycles of doxil thru 01-09-15, then CT 02-11-15 showed mixed response but with multiple new liver metastases. CA 125 on 02-02-15 was 299. She began weekly taxol with avastin 03-12-15.  Objective:  Vital signs in last 24 hours:  BP 154/98 mmHg  Pulse 105  Temp(Src) 97.5 F (36.4 C) (Oral)  Resp 19  Ht 5' 4"  (1.626 m)  Wt 233 lb 9.6 oz (105.96 kg)  BMI 40.08 kg/m2  SpO2 100% Weight up 1  lb Alert, oriented and appropriate. Ambulatory without assistance, able to get on and off exam table with assistance of 1.  Alopecia  HEENT:PERRL, sclerae not icteric. Oral mucosa moist without lesions, posterior pharynx clear. Minimal erythema at nasal turbinates, no purulent drainage. Voice improved. Neck supple. No JVD.  Lymphatics:no cervical,supraclavicular adenopathy Resp: clear to auscultation bilaterally and normal percussion bilaterally Cardio: regular rate and rhythm. No gallop. GI: abdomen obese, soft, nontender, not distended, no mass or appreciable organomegaly. Normally active bowel sounds. Surgical incision not remarkable. Musculoskeletal/ Extremities: without pitting edema, cords, tenderness Neuro: no increased peripheral neuropathy. Otherwise nonfocal. PSYCH appropriate mood and affect Skin without rash, ecchymosis, petechiae Portacath-without erythema or tenderness  Lab Results:  Results for orders placed or performed in visit on 05/14/15  CBC with Differential  Result Value Ref Range   WBC 8.3 3.9 - 10.3 10e3/uL   NEUT# 7.2 (H) 1.5 - 6.5 10e3/uL   HGB 12.3 11.6 - 15.9 g/dL   HCT 38.8 34.8 - 46.6 %   Platelets 246 145 - 400 10e3/uL   MCV 82.4 79.5 - 101.0 fL   MCH 26.0 25.1 - 34.0 pg   MCHC 31.6 31.5 - 36.0 g/dL   RBC 4.71 3.70 - 5.45 10e6/uL   RDW 18.7 (H) 11.2 - 14.5 %   lymph# 0.9 0.9 - 3.3 10e3/uL   MONO# 0.1 0.1 - 0.9 10e3/uL   Eosinophils Absolute 0.0 0.0 - 0.5 10e3/uL   Basophils Absolute 0.0 0.0 - 0.1 10e3/uL   NEUT% 87.2 (H) 38.4 - 76.8 %   LYMPH% 11.5 (L) 14.0 - 49.7 %   MONO% 1.1 0.0 - 14.0 %   EOS% 0.0 0.0 - 7.0 %   BASO% 0.2 0.0 - 2.0 %  Comprehensive metabolic panel  Result Value Ref Range   Sodium 140 136 - 145 mEq/L   Potassium 4.3 3.5 - 5.1 mEq/L   Chloride 108 98 - 109 mEq/L   CO2 21 (L) 22 - 29 mEq/L   Glucose 121 70 - 140 mg/dl   BUN 15.4 7.0 - 26.0 mg/dL   Creatinine 0.8 0.6 - 1.1 mg/dL   Total Bilirubin <0.30 0.20 - 1.20 mg/dL    Alkaline Phosphatase 92 40 - 150 U/L   AST 16 5 - 34 U/L   ALT 15 0 - 55 U/L   Total Protein 7.5 6.4 - 8.3 g/dL   Albumin 3.5 3.5 - 5.0 g/dL   Calcium 9.9 8.4 - 10.4 mg/dL   Anion Gap 11 3 - 11 mEq/L   EGFR >90 >90 ml/min/1.73 m2   Will recheck CA 125 with labs 05-21-15  Studies/Results:  No results found.  Medications: I have reviewed the patient's current medications. Add Claritin. Has not needed granix.  DISCUSSION Patient is pleased that she is tolerating present treatment this well. She understands that recommendation may be to continue treatment, this depending on marker and findings on CT after present cycle 3 completes.   Assessment/Plan:  1. 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 Recurrent disease found by increasing CA 125 initially 08-2014 . Mixed response to 4 cycles  of doxil thru 01-09-15, but multiple new liver mets. Now trying weekly taxol days 1,8,15 every 28 days with avastin days 1 and 15. She will have day 15  cycle 3 onb 2-16 as long as ANC >=1.5 and plt >=100k each and urine protein meets parameters. She will have CT AP ~ 2-24. MD visits coordinating with above as best possible; niece prefers visits on days of treatment rather than separate trips to office.  2.Genetics testing normal 3 PAC in 4.GI symptoms multifactorial, improved with regular laxatives and H2 blocker, and may be better also with some response to present chemo avastin regimen 5.Anemia previously: hemoglobin remaining in good range now 6.neuropathy symptoms from taxol feet>hands essentially resolved after initial taxol, following now back on that drug but not increasing 7.does not have Advance Directives per EMR 8.flu vaccine 01-05-15 9.EF good by echocardiogram prior to #4 doxil 10.morbid obesity, BMI 40. She and son are trying to improve her diet 11.intermittent low back  discomfort not new, better when not so sedentary.  12.likely mild environmental allergies: prn claritin  All questions answered. Chemo and avastin orders confirmed. Time spent 25 min including >50% counseling and coordination of care.     LIVESAY,LENNIS P, MD   05/14/2015, 12:50 PM

## 2015-05-14 NOTE — Patient Instructions (Signed)

## 2015-05-21 ENCOUNTER — Ambulatory Visit (HOSPITAL_BASED_OUTPATIENT_CLINIC_OR_DEPARTMENT_OTHER): Payer: Medicare Other

## 2015-05-21 ENCOUNTER — Other Ambulatory Visit (HOSPITAL_BASED_OUTPATIENT_CLINIC_OR_DEPARTMENT_OTHER): Payer: Medicare Other

## 2015-05-21 ENCOUNTER — Ambulatory Visit: Payer: Medicare Other

## 2015-05-21 VITALS — BP 141/68 | HR 106 | Temp 97.9°F | Resp 18 | Ht 64.0 in

## 2015-05-21 DIAGNOSIS — C562 Malignant neoplasm of left ovary: Secondary | ICD-10-CM

## 2015-05-21 DIAGNOSIS — C569 Malignant neoplasm of unspecified ovary: Secondary | ICD-10-CM

## 2015-05-21 DIAGNOSIS — C787 Secondary malignant neoplasm of liver and intrahepatic bile duct: Secondary | ICD-10-CM | POA: Diagnosis not present

## 2015-05-21 DIAGNOSIS — Z95828 Presence of other vascular implants and grafts: Secondary | ICD-10-CM

## 2015-05-21 DIAGNOSIS — Z5112 Encounter for antineoplastic immunotherapy: Secondary | ICD-10-CM | POA: Diagnosis not present

## 2015-05-21 LAB — CBC WITH DIFFERENTIAL/PLATELET
BASO%: 0.2 % (ref 0.0–2.0)
BASOS ABS: 0 10*3/uL (ref 0.0–0.1)
EOS%: 0 % (ref 0.0–7.0)
Eosinophils Absolute: 0 10*3/uL (ref 0.0–0.5)
HCT: 39.5 % (ref 34.8–46.6)
HEMOGLOBIN: 12.4 g/dL (ref 11.6–15.9)
LYMPH%: 14.9 % (ref 14.0–49.7)
MCH: 26.1 pg (ref 25.1–34.0)
MCHC: 31.4 g/dL — ABNORMAL LOW (ref 31.5–36.0)
MCV: 83.1 fL (ref 79.5–101.0)
MONO#: 0.1 10*3/uL (ref 0.1–0.9)
MONO%: 1.1 % (ref 0.0–14.0)
NEUT#: 3.9 10*3/uL (ref 1.5–6.5)
NEUT%: 83.8 % — ABNORMAL HIGH (ref 38.4–76.8)
Platelets: 262 10*3/uL (ref 145–400)
RBC: 4.76 10*6/uL (ref 3.70–5.45)
RDW: 18.9 % — AB (ref 11.2–14.5)
WBC: 4.7 10*3/uL (ref 3.9–10.3)
lymph#: 0.7 10*3/uL — ABNORMAL LOW (ref 0.9–3.3)

## 2015-05-21 LAB — COMPREHENSIVE METABOLIC PANEL
ALT: 14 U/L (ref 0–55)
AST: 16 U/L (ref 5–34)
Albumin: 3.6 g/dL (ref 3.5–5.0)
Alkaline Phosphatase: 94 U/L (ref 40–150)
Anion Gap: 11 mEq/L (ref 3–11)
BUN: 15.9 mg/dL (ref 7.0–26.0)
CALCIUM: 9.9 mg/dL (ref 8.4–10.4)
CHLORIDE: 108 meq/L (ref 98–109)
CO2: 20 meq/L — AB (ref 22–29)
CREATININE: 0.8 mg/dL (ref 0.6–1.1)
EGFR: 86 mL/min/{1.73_m2} — ABNORMAL LOW (ref >90)
GLUCOSE: 122 mg/dL (ref 70–140)
POTASSIUM: 4.2 meq/L (ref 3.5–5.1)
SODIUM: 139 meq/L (ref 136–145)
Total Bilirubin: <0.3 mg/dL (ref 0.20–1.20)
Total Protein: 7.7 g/dL (ref 6.4–8.3)

## 2015-05-21 LAB — UA PROTEIN, DIPSTICK - CHCC: PROTEIN: NEGATIVE mg/dL

## 2015-05-21 MED ORDER — SODIUM CHLORIDE 0.9% FLUSH
10.0000 mL | INTRAVENOUS | Status: DC | PRN
  Administered 2015-05-21: 10 mL via INTRAVENOUS
  Filled 2015-05-21: qty 10

## 2015-05-21 MED ORDER — DIPHENHYDRAMINE HCL 50 MG/ML IJ SOLN
INTRAMUSCULAR | Status: AC
  Filled 2015-05-21: qty 1

## 2015-05-21 MED ORDER — FAMOTIDINE IN NACL 20-0.9 MG/50ML-% IV SOLN
20.0000 mg | Freq: Once | INTRAVENOUS | Status: AC
  Administered 2015-05-21: 20 mg via INTRAVENOUS

## 2015-05-21 MED ORDER — FAMOTIDINE IN NACL 20-0.9 MG/50ML-% IV SOLN
INTRAVENOUS | Status: AC
  Filled 2015-05-21: qty 50

## 2015-05-21 MED ORDER — SODIUM CHLORIDE 0.9 % IV SOLN
10.2000 mg/kg | Freq: Once | INTRAVENOUS | Status: AC
  Administered 2015-05-21: 1100 mg via INTRAVENOUS
  Filled 2015-05-21: qty 36

## 2015-05-21 MED ORDER — HEPARIN SOD (PORK) LOCK FLUSH 100 UNIT/ML IV SOLN
500.0000 [IU] | Freq: Once | INTRAVENOUS | Status: AC | PRN
  Administered 2015-05-21: 500 [IU]
  Filled 2015-05-21: qty 5

## 2015-05-21 MED ORDER — SODIUM CHLORIDE 0.9 % IV SOLN
Freq: Once | INTRAVENOUS | Status: AC
  Administered 2015-05-21: 10:00:00 via INTRAVENOUS
  Filled 2015-05-21: qty 8

## 2015-05-21 MED ORDER — SODIUM CHLORIDE 0.9 % IV SOLN
Freq: Once | INTRAVENOUS | Status: AC
  Administered 2015-05-21: 10:00:00 via INTRAVENOUS

## 2015-05-21 MED ORDER — PACLITAXEL CHEMO INJECTION 300 MG/50ML
80.0000 mg/m2 | Freq: Once | INTRAVENOUS | Status: AC
  Administered 2015-05-21: 174 mg via INTRAVENOUS
  Filled 2015-05-21: qty 29

## 2015-05-21 MED ORDER — DIPHENHYDRAMINE HCL 50 MG/ML IJ SOLN
50.0000 mg | Freq: Once | INTRAMUSCULAR | Status: AC
  Administered 2015-05-21: 50 mg via INTRAVENOUS

## 2015-05-21 MED ORDER — SODIUM CHLORIDE 0.9 % IJ SOLN
10.0000 mL | INTRAMUSCULAR | Status: DC | PRN
  Administered 2015-05-21: 10 mL
  Filled 2015-05-21: qty 10

## 2015-05-21 NOTE — Patient Instructions (Signed)
[place] Discharge Instructions for Patients Receiving Chemotherapy  Today you received the following chemotherapy agents Taxol and Avastin.  To help prevent nausea and vomiting after your treatment, we encourage you to take your nausea medication.   If you develop nausea and vomiting that is not controlled by your nausea medication, call the clinic.   BELOW ARE SYMPTOMS THAT SHOULD BE REPORTED IMMEDIATELY:  *FEVER GREATER THAN 100.5 F  *CHILLS WITH OR WITHOUT FEVER  NAUSEA AND VOMITING THAT IS NOT CONTROLLED WITH YOUR NAUSEA MEDICATION  *UNUSUAL SHORTNESS OF BREATH  *UNUSUAL BRUISING OR BLEEDING  TENDERNESS IN MOUTH AND THROAT WITH OR WITHOUT PRESENCE OF ULCERS  *URINARY PROBLEMS  *BOWEL PROBLEMS  UNUSUAL RASH Items with * indicate a potential emergency and should be followed up as soon as possible.  Feel free to call the clinic you have any questions or concerns. The clinic phone number is [phone].  Please show the [place] at check-in to the Emergency Department and triage nurse.

## 2015-05-22 LAB — CANCER ANTIGEN 125 (PARALLEL TESTING): CA 125: 213 U/mL — AB (ref <35)

## 2015-05-22 LAB — CA 125: Cancer Antigen (CA) 125: 125.5 U/mL — ABNORMAL HIGH (ref 0.0–38.1)

## 2015-05-23 ENCOUNTER — Encounter (HOSPITAL_COMMUNITY): Payer: Self-pay

## 2015-05-24 IMAGING — CR DG CHEST 2V
2 series · 2 of 2 positions shown · non-contrast
Comparison: Placement of right IJ port catheter 12/25/2013

CLINICAL DATA: 62-year-old female with endometrial cancer.
Preoperative chest x-ray prior to hysterectomy.

EXAM:
CHEST  2 VIEW

[w chest pa]
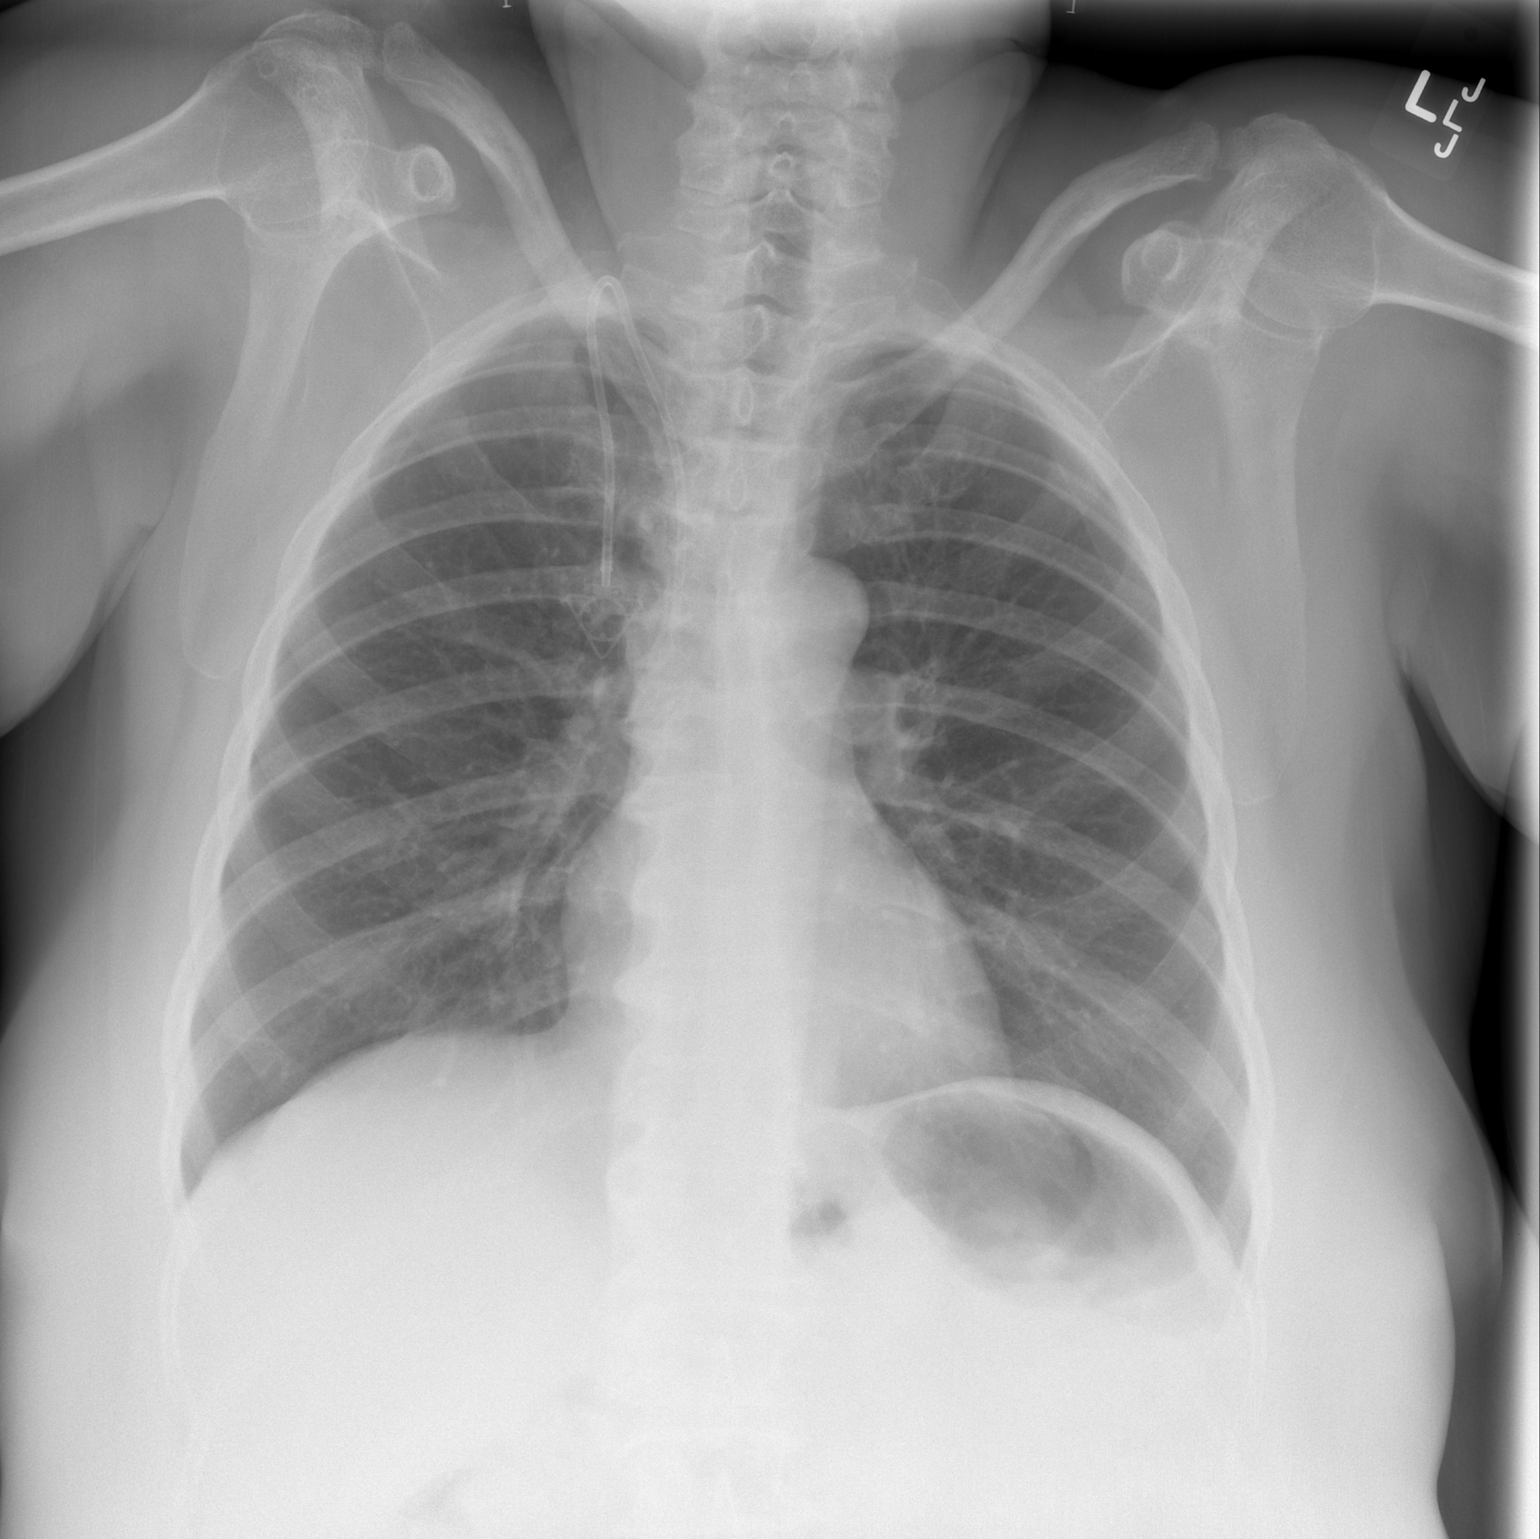

[w chest lat]
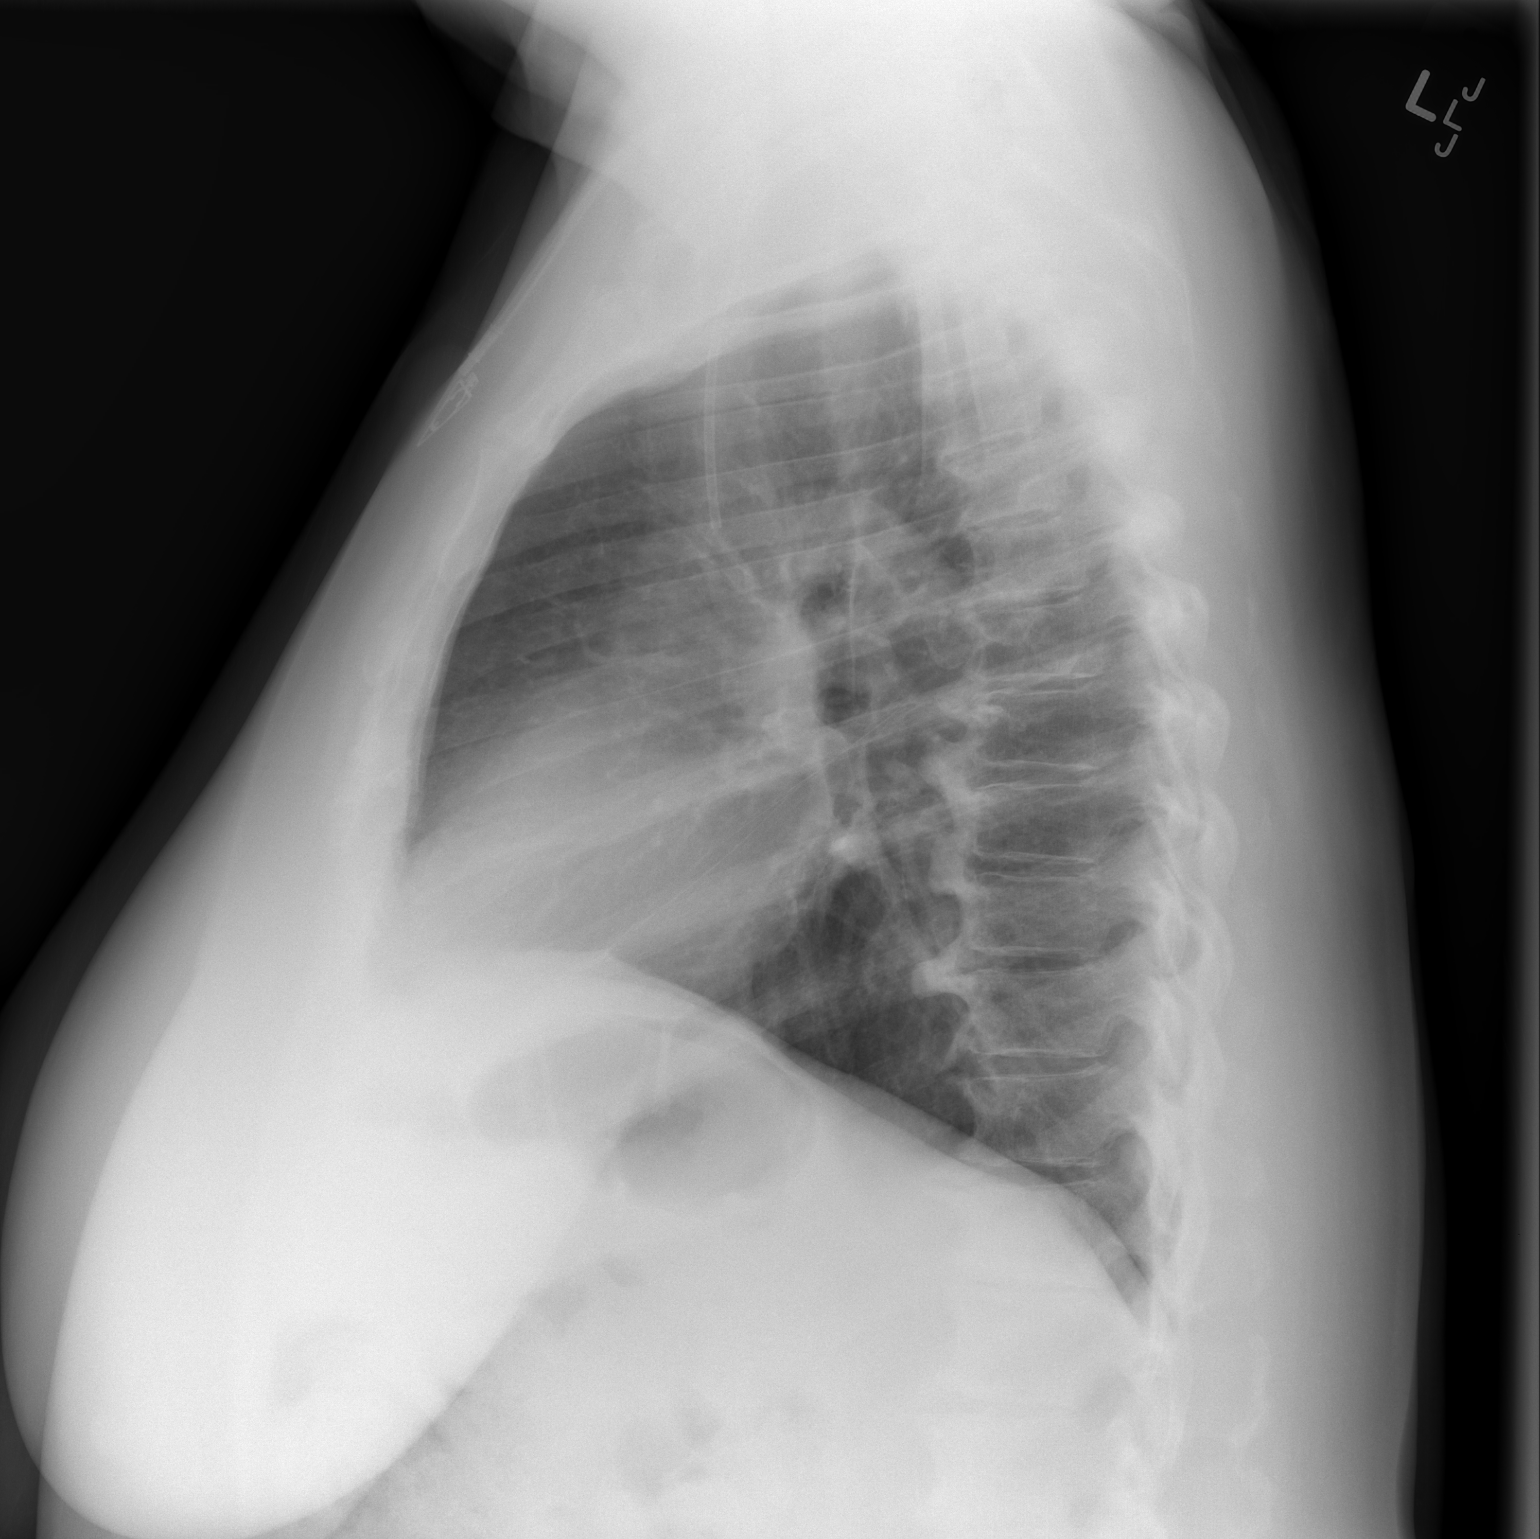

[2 of 2 positions shown; findings below may reference images not displayed]

FINDINGS: Right IJ approach single-lumen power injectable port catheter.
Catheter tip projects over the mid SVC. Cardiac and mediastinal
contours are within normal limits. The lungs are clear. Multilevel
degenerative spurring throughout the thoracic spine. No acute
osseous abnormality. Unremarkable visualized abdominal bowel gas
pattern.
IMPRESSION: No active cardiopulmonary disease.

## 2015-05-25 ENCOUNTER — Telehealth: Payer: Self-pay

## 2015-05-25 NOTE — Telephone Encounter (Signed)
Re new hip pain  Would try Aleve 1 every 8 hrs with food  Watch area and call if any rash  Heating pad or ice pack ok if helps  Let us know if not better  Thank you

## 2015-05-25 NOTE — Telephone Encounter (Signed)
S/w pt Dr Edwyna Shell response attached.

## 2015-05-25 NOTE — Telephone Encounter (Signed)
New R hip aching since last night, tylenol not working, hot bath helped. [place] rubbing alcohol not helping much. Cannot put pressure R side when sitting. Tossing, turning last night. Comes and goes. No known initiator. Not radiating. Walking fast will make it hurt, slow is not too bad. CT abd/pelvis scheduled Friday.

## 2015-05-29 ENCOUNTER — Ambulatory Visit (HOSPITAL_COMMUNITY)
Admission: RE | Admit: 2015-05-29 | Discharge: 2015-05-29 | Disposition: A | Discharge status: Home / Self Care | Payer: Medicare Other | Source: Ambulatory Visit | Attending: Oncology | Admitting: Oncology

## 2015-05-29 ENCOUNTER — Encounter (HOSPITAL_COMMUNITY): Payer: Self-pay

## 2015-05-29 DIAGNOSIS — E279 Disorder of adrenal gland, unspecified: Secondary | ICD-10-CM | POA: Diagnosis not present

## 2015-05-29 DIAGNOSIS — C786 Secondary malignant neoplasm of retroperitoneum and peritoneum: Secondary | ICD-10-CM | POA: Insufficient documentation

## 2015-05-29 DIAGNOSIS — C787 Secondary malignant neoplasm of liver and intrahepatic bile duct: Secondary | ICD-10-CM | POA: Diagnosis present

## 2015-05-29 DIAGNOSIS — R590 Localized enlarged lymph nodes: Secondary | ICD-10-CM | POA: Insufficient documentation

## 2015-05-29 DIAGNOSIS — C562 Malignant neoplasm of left ovary: Secondary | ICD-10-CM | POA: Insufficient documentation

## 2015-05-29 MED ORDER — IOHEXOL 300 MG/ML  SOLN
100.0000 mL | Freq: Once | INTRAMUSCULAR | Status: AC | PRN
  Administered 2015-05-29: 100 mL via INTRAVENOUS

## 2015-05-30 ENCOUNTER — Other Ambulatory Visit: Payer: Self-pay | Admitting: Oncology

## 2015-06-01 ENCOUNTER — Ambulatory Visit (HOSPITAL_BASED_OUTPATIENT_CLINIC_OR_DEPARTMENT_OTHER): Payer: Medicare Other | Admitting: Oncology

## 2015-06-01 ENCOUNTER — Other Ambulatory Visit (HOSPITAL_BASED_OUTPATIENT_CLINIC_OR_DEPARTMENT_OTHER): Payer: Medicare Other

## 2015-06-01 ENCOUNTER — Ambulatory Visit (HOSPITAL_BASED_OUTPATIENT_CLINIC_OR_DEPARTMENT_OTHER): Payer: Medicare Other

## 2015-06-01 ENCOUNTER — Encounter: Payer: Self-pay | Admitting: Oncology

## 2015-06-01 ENCOUNTER — Telehealth: Payer: Self-pay | Admitting: Oncology

## 2015-06-01 VITALS — BP 168/106 | HR 86 | Temp 97.3°F | Resp 18 | Ht 64.0 in | Wt 234.8 lb

## 2015-06-01 DIAGNOSIS — C562 Malignant neoplasm of left ovary: Secondary | ICD-10-CM

## 2015-06-01 DIAGNOSIS — C569 Malignant neoplasm of unspecified ovary: Secondary | ICD-10-CM

## 2015-06-01 DIAGNOSIS — Z95828 Presence of other vascular implants and grafts: Secondary | ICD-10-CM

## 2015-06-01 DIAGNOSIS — C787 Secondary malignant neoplasm of liver and intrahepatic bile duct: Secondary | ICD-10-CM | POA: Diagnosis not present

## 2015-06-01 DIAGNOSIS — D509 Iron deficiency anemia, unspecified: Secondary | ICD-10-CM

## 2015-06-01 DIAGNOSIS — I158 Other secondary hypertension: Secondary | ICD-10-CM

## 2015-06-01 DIAGNOSIS — Z79899 Other long term (current) drug therapy: Secondary | ICD-10-CM

## 2015-06-01 DIAGNOSIS — G620 Drug-induced polyneuropathy: Secondary | ICD-10-CM | POA: Diagnosis not present

## 2015-06-01 LAB — CBC WITH DIFFERENTIAL/PLATELET
BASO%: 0.5 % (ref 0.0–2.0)
BASOS ABS: 0 10*3/uL (ref 0.0–0.1)
EOS%: 0.6 % (ref 0.0–7.0)
Eosinophils Absolute: 0 10*3/uL (ref 0.0–0.5)
HCT: 36.2 % (ref 34.8–46.6)
HEMOGLOBIN: 11.5 g/dL — AB (ref 11.6–15.9)
LYMPH#: 1.8 10*3/uL (ref 0.9–3.3)
LYMPH%: 35.3 % (ref 14.0–49.7)
MCH: 26.5 pg (ref 25.1–34.0)
MCHC: 31.8 g/dL (ref 31.5–36.0)
MCV: 83.1 fL (ref 79.5–101.0)
MONO#: 0.6 10*3/uL (ref 0.1–0.9)
MONO%: 10.7 % (ref 0.0–14.0)
NEUT%: 52.9 % (ref 38.4–76.8)
NEUTROS ABS: 2.8 10*3/uL (ref 1.5–6.5)
Platelets: 224 10*3/uL (ref 145–400)
RBC: 4.35 10*6/uL (ref 3.70–5.45)
RDW: 19.4 % — AB (ref 11.2–14.5)
WBC: 5.2 10*3/uL (ref 3.9–10.3)

## 2015-06-01 LAB — COMPREHENSIVE METABOLIC PANEL
ALBUMIN: 3.2 g/dL — AB (ref 3.5–5.0)
ALK PHOS: 81 U/L (ref 40–150)
ALT: 13 U/L (ref 0–55)
ANION GAP: 8 meq/L (ref 3–11)
AST: 17 U/L (ref 5–34)
BUN: 12.9 mg/dL (ref 7.0–26.0)
CALCIUM: 9.5 mg/dL (ref 8.4–10.4)
CO2: 23 mEq/L (ref 22–29)
Chloride: 109 mEq/L (ref 98–109)
Creatinine: 0.8 mg/dL (ref 0.6–1.1)
Glucose: 94 mg/dl (ref 70–140)
Potassium: 3.7 mEq/L (ref 3.5–5.1)
Sodium: 140 mEq/L (ref 136–145)
TOTAL PROTEIN: 6.7 g/dL (ref 6.4–8.3)

## 2015-06-01 MED ORDER — DEXAMETHASONE 4 MG PO TABS: ORAL_TABLET | ORAL | Status: DC

## 2015-06-01 MED ORDER — SODIUM CHLORIDE 0.9% FLUSH
10.0000 mL | INTRAVENOUS | Status: DC | PRN
  Administered 2015-06-01: 10 mL via INTRAVENOUS
  Filled 2015-06-01: qty 10

## 2015-06-01 MED ORDER — LISINOPRIL-HYDROCHLOROTHIAZIDE 20-25 MG PO TABS: 1.0000 | ORAL_TABLET | Freq: Every day | ORAL | Status: DC

## 2015-06-01 MED ORDER — HEPARIN SOD (PORK) LOCK FLUSH 100 UNIT/ML IV SOLN
500.0000 [IU] | Freq: Once | INTRAVENOUS | Status: AC
  Administered 2015-06-01: 500 [IU] via INTRAVENOUS
  Filled 2015-06-01: qty 5

## 2015-06-01 NOTE — Progress Notes (Signed)
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   The CT shows somewhat mixed response, with significant decrease in omental involvement and improvement at vaginal cuff, increase in one left external iliac node and slight increase in one liver lesion, with other areas in liver stable or smaller and no new involvement. Patient has more elevated blood pressure today, which is likely in part avastin related. Otherwise she is tolerating this regimen very well, including no increase in peripheral neuropathy and no bleeding.  Patient ate church dinner yesterday, with salty and fried foods etc. She had been on zestoretic 20-25 by Dr Jenny Reichmann for several years, DCd ~ 18 months ago. Blood pressures have been trending up at this office, tho somewhat variable. She has had no chest pain or other associated symptoms. She has had more discomfort in right hip for past week, took Aleve x 1 last pm and has no symptoms there today. I have suggested she can take baby ASA daily instead of NSAID until BP better. No other pain. Bowels are moving well. No SOB, abdominal or pelvic discomfort, no nausea or vomiting, energy fairly good.  Remainder of 10 point Review of Systems negative    PAC in Genetics testing sent 03-02-15, normal by Breast Ovarian Panel by [facility] CA 125 was 1406 in 10-2013 and was 692 as baseline for weekly taxol avastin on 03-12-15 Flu vaccine 01-05-15  ONCOLOGIC HISTORY Patient had acute abdominal pain Dec 2014, which resolved without medical attention, then vague diffuse abdominal discomfort, low grade nausea and abdominal bloating. She was seen in June 2015 for first gyn exam in years, PAP with AGUS and  follow up biopsies of endocervix and endometrium by Dr Pamala Hurry both with serous apparent endometrial cancer (pathology from [facility] 09-12-13, case # [id number] to be scanned into this EMR). Biopsy of cervix had normal squamous epithelium. CA125 from [facility] 09-16-13 was 964, and was up to 1406 by 10-23-13.Marland Kitchen She was seen by Dr Denman George on 09-23-13, her exam remarkable for large pelvic mass minimally mobile and extending to umbilicus. She had CT CAP 09-26-13 had prominent but not enlarged juxtapericardiac lymph nodes, trace right pleural effusion, large amount of abnormal soft tissue thruout peritoneal cavity, predominately with omentum and especially LUQ, largest area 3.8 x 3.0 x 4.0 cm anterior to proximal descending colon, implants adjacent to liver, large complex multicystic lesion in pelvis inseperable from uterus and adnexae, with mass effect on bladder, questionable area in central aspect of dome of liver, borderline enlarged retroperitoneal nodes. Due to extent of disease, treatment began with chemotherapy. Cycle one taxol carboplatin was given on 7-40-81, complicated by severe nausea/vomiting, constipation and severe taxol aches. Cycle 2 was changed to dose dense carbo taxol, day 1 on 11-01-13. She needed gCSF support by cycle 3. Neoadjuvant chemo was one cycle carbo taxol standard dose and 3 cycles dose dense. 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 She resumed chemo with cycle 4 on 02-21-14 and completed cycle 6 on 04-25-14. She had unremarkable exam by Dr Denman George 08-11-14, however increase in CA 125 marker then, from 8  in 05-2014 to 26 in 08-2014, and repeat 63 on 09-08-14. CT AP 09-16-14 shows progressive peritoneal metastases, increasing retroperitoneal and transverse mesocolon nodes and soft tissue area at vaginal cuff stable to slightly decreased. She began doxil on 10-10-14, CA  125 151 that day as baseline for doxil. She had 4 cycles of doxil thru 01-09-15, then CT 02-11-15 showed mixed response but with multiple new liver metastases. CA 125 on 02-02-15 was 299. She began weekly taxol with avastin 03-12-15. Restaging CT AP after 3 cycles 05-29-15 showed improvement in omental and vaginal cuff involvement, single liver lesion slightly larger and right external iliac node increased, no other new disease, other liver areas stable to slightly smaller.    Objective:  Vital signs in last 24 hours:  BP 168/106 mmHg  Pulse 86  Temp(Src) 97.3 F (36.3 C) (Oral)  Resp 18  Ht 5' 4"  (1.626 m)  Wt 234 lb 12.8 oz (106.505 kg)  BMI 40.28 kg/m2  SpO2 100% Weight up 1 lb. BP repeated and confirmed. Alert, oriented and appropriate. Ambulatory without assistance, able to get on and off exam table..  No alopecia  HEENT:PERRL, sclerae not icteric. Oral mucosa moist without lesions, posterior pharynx clear.  Neck supple. No JVD.  Lymphatics:no cervical,supraclavicular adenopathy Resp: clear to auscultation bilaterally and normal percussion bilaterally Cardio: regular rate and rhythm. No gallop. GI: abdomen obese, soft, nontender, not distended, no mass or organomegaly. A few bowel sounds. Musculoskeletal/ Extremities: without pitting edema, cords, tenderness Neuro: no peripheral neuropathy. Otherwise nonfocal Skin without rash including right hip, no ecchymosis, petechiae Portacath-without erythema or tenderness  Lab Results:  Results for orders placed or performed in visit on 06/01/15  CBC with Differential  Result Value Ref Range   WBC 5.2 3.9 - 10.3 10e3/uL   NEUT# 2.8 1.5 - 6.5 10e3/uL   HGB 11.5 (L) 11.6 - 15.9 g/dL   HCT 36.2 34.8 - 46.6 %   Platelets 224 145 - 400 10e3/uL   MCV 83.1 79.5 - 101.0 fL   MCH 26.5 25.1 - 34.0 pg   MCHC 31.8 31.5 - 36.0 g/dL   RBC 4.35 3.70 - 5.45 10e6/uL   RDW 19.4 (H) 11.2 - 14.5 %   lymph# 1.8 0.9 - 3.3 10e3/uL   MONO# 0.6 0.1 -  0.9 10e3/uL   Eosinophils Absolute 0.0 0.0 - 0.5 10e3/uL   Basophils Absolute 0.0 0.0 - 0.1 10e3/uL   NEUT% 52.9 38.4 - 76.8 %   LYMPH% 35.3 14.0 - 49.7 %   MONO% 10.7 0.0 - 14.0 %   EOS% 0.6 0.0 - 7.0 %   BASO% 0.5 0.0 - 2.0 %  Comprehensive metabolic panel  Result Value Ref Range   Sodium 140 136 - 145 mEq/L   Potassium 3.7 3.5 - 5.1 mEq/L   Chloride 109 98 - 109 mEq/L   CO2 23 22 - 29 mEq/L   Glucose 94 70 - 140 mg/dl   BUN 12.9 7.0 - 26.0 mg/dL   Creatinine 0.8 0.6 - 1.1 mg/dL   Total Bilirubin <0.30 0.20 - 1.20 mg/dL   Alkaline Phosphatase 81 40 - 150 U/L   AST 17 5 - 34 U/L   ALT 13 0 - 55 U/L   Total Protein 6.7 6.4 - 8.3 g/dL   Albumin 3.2 (L) 3.5 - 5.0 g/dL   Calcium 9.5 8.4 - 10.4 mg/dL   Anion Gap 8 3 - 11 mEq/L   EGFR >90 >90 ml/min/1.73 m2     Studies/Results:  EXAM: CT ABDOMEN AND PELVIS WITH CONTRAST 2-2  TECHNIQUE: Multidetector CT imaging of the abdomen and pelvis was performed using the standard protocol following bolus administration of intravenous contrast.  CONTRAST: 140m OMNIPAQUE IOHEXOL 300 MG/ML SOLN  COMPARISON: 02/11/2015  FINDINGS: Lower chest: Lung bases are clear.  Hepatobiliary: Low-density hepatic metastasis again demonstrated. Lesion in the caudate lobe measures 24 mm x 19 (image 21, series 2) increased from 23 mm x 16 mm. Other lesions are stable or slightly decreased. For example 18 mm lesion the posterior RIGHT hepatic lobe (image 23, series 2) decreased from 22 mm. Lesion in the RIGHT hepatic lobe on image 15 measures 11 mm compared to 10 mm. No new lesions identified. Gallbladder normal.  Pancreas: No pancreatic duct dilatation. No pancreatic lesion.  Spleen: Normal spleen  Adrenals/urinary tract: Bilateral adrenal gland nodularity is stable. Kidneys, ureters and bladder normal.  Stomach/Bowel: Large hiatal hernia. The duodenum and small bowel are normal. Appendix not identified. Moderate volume stool  throughout the colon.  Vascular/Lymphatic: Abdominal aorta is normal caliber. No retroperitoneal periportal lymphadenopathy. No mesenteric adenopathy. Rounded ovoid lesion along the LEFT external iliac vessels measuring 16 mm (Image 70, series 2) compares to 9 mm on prior and presumably a lymph node. Adjacent lymph node more superficial measures 15 mm on image 71, series 2 compared to 14 mm.  Reproductive: Post hysterectomy. Fullness along the vaginal cuff is decreased measuring 32 x 19 mm compared to 38 x 27 mm (image 69, series 2).  Other: The omental nodularity present on comparison exam is decreased significantly. Example omental mass in the LEFT upper abdomen measures 52 x 13 mm (image 27, series 2) decreased from 72 x 24 mm. A ventral peritoneal nodule metastasis on image 26, series 2 measures 4 mm decreased from 5 mm. No new omental disease or mesenteric disease. Peritoneal nodularity at the base the cecum (image 50, series 2) is also decreased in volume.  Musculoskeletal: No aggressive osseous lesion.  IMPRESSION: 1. Overall relatively stable metastatic disease in the liver. One lesion in caudate lobe is increased in size. The remaining lesions are either stable or slightly decreased. No new hepatic lesions identified. 2. Significant interval improvement in mesenteric and omental peritoneal metastasis. Mild residual nodularity remains. 3. Interval enlargement of LEFT external iliac lymphadenopathy. 4. Interval decrease in volume of soft tissue thickening at the vaginal cuff. 5. Stable adrenal lesions appear benign.  CT information discussed with patient and niece. PACs images reviewed by MD   Medications: I have reviewed the patient's current medications. Resume zestoretic 20-25 daily.  DISCUSSION Overall improvement in CT with 3 cycles of low dose taxol with avastin, which she is tolerating other than more elevated BP (which may also reflect high salt intake in  last 24 hrs). She agrees with resuming antihypertensive and will follow BPs at home; we can ask Dr JJenny Reichmannto assist if needed. Will hold avastin with day 1 cycle 4 on 06-04-15, then resume with day 15 cycle 4 if BP improved. She will watch salt intake. Hold NSAID until BP better.  Right hip pain likely degenerative and related to obesity.   Assessment/Plan:  1. 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 Recurrent disease found by increasing CA 125 initially 08-2014 . Mixed response to 4 cycles of doxil thru 01-09-15, but multiple new liver mets. Weekly taxol days 1,8,15 every 28 days with avastin days  1 and 15 has given some improvement by CT 05-29-15, continue with adjustments in avastin as noted. Genetics testing normal 2.elevated BP: likely from avastin + dietary. Resume zestoretic, which she has tolerated well in past. Hold avastin day 1 cycle 4, resume with day 15 cycle 4 if BP allows. 3 PAC in 4.GI symptoms improved with regular laxatives and H2 blocker, and may be better also with some response to present chemo avastin regimen 5.Anemia previously: iron deficiency and chemo. Hgb still good at 11.6 Continue po iron for now. 6.neuropathy symptoms from taxol feet>hands essentially resolved after initial taxol, following now back on that drug but no worse 7.does not have Advance Directives per EMR 8.flu vaccine 01-05-15 9.EF good by echocardiogram prior to #4 doxil 10.morbid obesity, BMI 40. She and son are trying to improve her diet 11.intermittent low back discomfort not new, better when not so sedentary. RIght hip pain also likely degenerative and weight related.   All questions answered. Chemo and avastin orders confirmed/ adjusted. Time spent 30 min including >50% counseling and coordination of care. Cc PCP     Eriko Economos P, MD   06/02/2015, 3:30 PM

## 2015-06-01 NOTE — Telephone Encounter (Signed)
Gave patient avs report and appointments for March and April.  °

## 2015-06-01 NOTE — Patient Instructions (Signed)

## 2015-06-02 DIAGNOSIS — I158 Other secondary hypertension: Secondary | ICD-10-CM | POA: Insufficient documentation

## 2015-06-04 ENCOUNTER — Ambulatory Visit (HOSPITAL_BASED_OUTPATIENT_CLINIC_OR_DEPARTMENT_OTHER): Payer: Medicare Other

## 2015-06-04 VITALS — BP 143/90 | HR 106 | Temp 97.7°F

## 2015-06-04 DIAGNOSIS — Z5111 Encounter for antineoplastic chemotherapy: Secondary | ICD-10-CM | POA: Diagnosis not present

## 2015-06-04 DIAGNOSIS — C569 Malignant neoplasm of unspecified ovary: Secondary | ICD-10-CM

## 2015-06-04 MED ORDER — HEPARIN SOD (PORK) LOCK FLUSH 100 UNIT/ML IV SOLN
500.0000 [IU] | Freq: Once | INTRAVENOUS | Status: AC | PRN
  Administered 2015-06-04: 500 [IU]
  Filled 2015-06-04: qty 5

## 2015-06-04 MED ORDER — FAMOTIDINE IN NACL 20-0.9 MG/50ML-% IV SOLN
INTRAVENOUS | Status: AC
  Filled 2015-06-04: qty 50

## 2015-06-04 MED ORDER — PACLITAXEL CHEMO INJECTION 300 MG/50ML
80.0000 mg/m2 | Freq: Once | INTRAVENOUS | Status: AC
  Administered 2015-06-04: 174 mg via INTRAVENOUS
  Filled 2015-06-04: qty 29

## 2015-06-04 MED ORDER — FAMOTIDINE IN NACL 20-0.9 MG/50ML-% IV SOLN
20.0000 mg | Freq: Once | INTRAVENOUS | Status: AC
  Administered 2015-06-04: 20 mg via INTRAVENOUS

## 2015-06-04 MED ORDER — SODIUM CHLORIDE 0.9 % IV SOLN
Freq: Once | INTRAVENOUS | Status: AC
  Administered 2015-06-04: 14:00:00 via INTRAVENOUS

## 2015-06-04 MED ORDER — SODIUM CHLORIDE 0.9 % IV SOLN
Freq: Once | INTRAVENOUS | Status: AC
  Administered 2015-06-04: 14:00:00 via INTRAVENOUS
  Filled 2015-06-04: qty 8

## 2015-06-04 MED ORDER — SODIUM CHLORIDE 0.9 % IJ SOLN
10.0000 mL | INTRAMUSCULAR | Status: DC | PRN
  Administered 2015-06-04: 10 mL
  Filled 2015-06-04: qty 10

## 2015-06-04 MED ORDER — DIPHENHYDRAMINE HCL 50 MG/ML IJ SOLN
50.0000 mg | Freq: Once | INTRAMUSCULAR | Status: AC
  Administered 2015-06-04: 50 mg via INTRAVENOUS

## 2015-06-04 MED ORDER — DIPHENHYDRAMINE HCL 50 MG/ML IJ SOLN
INTRAMUSCULAR | Status: AC
  Filled 2015-06-04: qty 1

## 2015-06-04 NOTE — Patient Instructions (Signed)
Cancer Center Discharge Instructions for Patients Receiving Chemotherapy  Today you received the following chemotherapy agents:  Taxol  To help prevent nausea and vomiting after your treatment, we encourage you to take your nausea medication as prescribed.   If you develop nausea and vomiting that is not controlled by your nausea medication, call the clinic.   BELOW ARE SYMPTOMS THAT SHOULD BE REPORTED IMMEDIATELY:  *FEVER GREATER THAN 100.5 F  *CHILLS WITH OR WITHOUT FEVER  NAUSEA AND VOMITING THAT IS NOT CONTROLLED WITH YOUR NAUSEA MEDICATION  *UNUSUAL SHORTNESS OF BREATH  *UNUSUAL BRUISING OR BLEEDING  TENDERNESS IN MOUTH AND THROAT WITH OR WITHOUT PRESENCE OF ULCERS  *URINARY PROBLEMS  *BOWEL PROBLEMS  UNUSUAL RASH Items with * indicate a potential emergency and should be followed up as soon as possible.  Feel free to call the clinic you have any questions or concerns. The clinic phone number is [phone].  Please show the CHEMO ALERT CARD at check-in to the Emergency Department and triage nurse.   

## 2015-06-09 ENCOUNTER — Telehealth: Payer: Self-pay

## 2015-06-09 NOTE — Telephone Encounter (Signed)
Patient called today c/o "dizziness and spinning head".  She states that her PCP placed her back on a BP medication and she was going to the local fire dept to have her blood pressure checked today.  Patient is calling  because she doesn't believe the dizziness is caused by BP problems but states that one of her ears is blocked. She has used peroxide in her ears to clean them out however one of her ears is completely blocked.  She was asking what the OTC "ear gtts" for swimmers ear that Dr. Marko Plume recommended to her one year or more ago.  Barbaraann Share, RN is aware and will call patient back.

## 2015-06-09 NOTE — Telephone Encounter (Addendum)
Spoke with Melissa Ball.  Told her that she developed this wooziness ~12-13-13.   Dr. Marko Plume told her to use claritin 10 mg daily, the saline nose spray, and hold BP medication.  She was prescribed Antivert 25 mg to use prn. Told her that she could purchase Debrox (Carbamide Peroxide) OTC to soften the wax in ear. She will begin the Claritin, continue with saline spray, purchase the Debrox, continue the BP med as BP increasing possibly due to Avastin.Marland Kitchen  No Antivert at this time. She is afebrile.  Coughing up clear phlegm from post nasal drip. She is to come 06-11-15 at 0800 for lab and treatment.  Told her to let nurse know how she is feeling prior to treatment. Melissa Ball was going to fire station to have BP checked when her niece Melissa Ball  could come by to give her a ride.

## 2015-06-09 NOTE — Telephone Encounter (Addendum)
Melissa Ball called at 1630 stating that her BP was 134/100 at the fire station. Told her that Dr. Marko Plume wants her to contact PCP tomorrow to F/U BP and Dizziness.  Requested niece [name]  follow up with [name] or PCP to have an appointment made to be evaluated asap. [name] verbalized understanding.

## 2015-06-10 ENCOUNTER — Telehealth: Payer: Self-pay | Admitting: Internal Medicine

## 2015-06-10 ENCOUNTER — Encounter: Payer: Self-pay | Admitting: *Deleted

## 2015-06-10 MED ORDER — NEOMYCIN-POLYMYXIN-HC 1 % OT SOLN: 3.0000 [drp] | Freq: Four times a day (QID) | OTIC | Status: DC

## 2015-06-10 NOTE — Telephone Encounter (Signed)
[place] for topical antibiotic ear gtt, but would need OV if persists or worsens, or consider UC or ER

## 2015-06-10 NOTE — Telephone Encounter (Signed)
Please advise as to what we can let the patient know

## 2015-06-10 NOTE — Telephone Encounter (Signed)
Pt is feeling woozy and she thinks she has a swimmers ear or inner ear. Her oncologist told her to call you to see if you can prescribe her something for this. The oncologist put her on bp medication.  Can you please give pt a call. She goes back in to see the oncologist tomorrow.

## 2015-06-11 ENCOUNTER — Other Ambulatory Visit (HOSPITAL_BASED_OUTPATIENT_CLINIC_OR_DEPARTMENT_OTHER): Payer: Medicare Other

## 2015-06-11 ENCOUNTER — Ambulatory Visit (HOSPITAL_BASED_OUTPATIENT_CLINIC_OR_DEPARTMENT_OTHER): Payer: Medicare Other

## 2015-06-11 ENCOUNTER — Other Ambulatory Visit: Payer: Medicare Other

## 2015-06-11 VITALS — BP 136/82 | HR 111 | Temp 96.0°F | Resp 18

## 2015-06-11 DIAGNOSIS — R300 Dysuria: Secondary | ICD-10-CM

## 2015-06-11 DIAGNOSIS — C569 Malignant neoplasm of unspecified ovary: Secondary | ICD-10-CM

## 2015-06-11 DIAGNOSIS — Z5111 Encounter for antineoplastic chemotherapy: Secondary | ICD-10-CM

## 2015-06-11 LAB — COMPREHENSIVE METABOLIC PANEL
ALK PHOS: 90 U/L (ref 40–150)
ALT: 16 U/L (ref 0–55)
AST: 19 U/L (ref 5–34)
Albumin: 3.7 g/dL (ref 3.5–5.0)
Anion Gap: 12 mEq/L — ABNORMAL HIGH (ref 3–11)
BUN: 22.2 mg/dL (ref 7.0–26.0)
CO2: 21 mEq/L — ABNORMAL LOW (ref 22–29)
CREATININE: 0.9 mg/dL (ref 0.6–1.1)
Calcium: 10.2 mg/dL (ref 8.4–10.4)
Chloride: 104 mEq/L (ref 98–109)
EGFR: 74 mL/min/{1.73_m2} — ABNORMAL LOW (ref >90)
Glucose: 128 mg/dl (ref 70–140)
POTASSIUM: 4.3 meq/L (ref 3.5–5.1)
Sodium: 137 mEq/L (ref 136–145)
Total Bilirubin: 0.35 mg/dL (ref 0.20–1.20)
Total Protein: 7.8 g/dL (ref 6.4–8.3)

## 2015-06-11 LAB — URINALYSIS, MICROSCOPIC - CHCC
BILIRUBIN (URINE): NEGATIVE
Glucose: NEGATIVE mg/dL
KETONES: NEGATIVE mg/dL
NITRITE: POSITIVE
Protein: NEGATIVE mg/dL
SPECIFIC GRAVITY, URINE: 1.02 (ref 1.003–1.035)
Urobilinogen, UR: 0.2 mg/dL (ref 0.2–1)
pH: 6 (ref 4.6–8.0)

## 2015-06-11 LAB — CBC WITH DIFFERENTIAL/PLATELET
BASO%: 0 % (ref 0.0–2.0)
BASOS ABS: 0 10*3/uL (ref 0.0–0.1)
EOS ABS: 0 10*3/uL (ref 0.0–0.5)
EOS%: 0 % (ref 0.0–7.0)
HEMATOCRIT: 39.9 % (ref 34.8–46.6)
HGB: 13.2 g/dL (ref 11.6–15.9)
LYMPH#: 1.3 10*3/uL (ref 0.9–3.3)
LYMPH%: 16.4 % (ref 14.0–49.7)
MCH: 27 pg (ref 25.1–34.0)
MCHC: 33.1 g/dL (ref 31.5–36.0)
MCV: 81.6 fL (ref 79.5–101.0)
MONO#: 0.1 10*3/uL (ref 0.1–0.9)
MONO%: 1.3 % (ref 0.0–14.0)
NEUT#: 6.3 10*3/uL (ref 1.5–6.5)
NEUT%: 82.3 % — AB (ref 38.4–76.8)
PLATELETS: 262 10*3/uL (ref 145–400)
RBC: 4.89 10*6/uL (ref 3.70–5.45)
RDW: 18 % — ABNORMAL HIGH (ref 11.2–14.5)
WBC: 7.6 10*3/uL (ref 3.9–10.3)

## 2015-06-11 MED ORDER — HEPARIN SOD (PORK) LOCK FLUSH 100 UNIT/ML IV SOLN
500.0000 [IU] | Freq: Once | INTRAVENOUS | Status: AC | PRN
  Administered 2015-06-11: 500 [IU]
  Filled 2015-06-11: qty 5

## 2015-06-11 MED ORDER — SODIUM CHLORIDE 0.9 % IV SOLN
Freq: Once | INTRAVENOUS | Status: AC
  Administered 2015-06-11: 09:00:00 via INTRAVENOUS
  Filled 2015-06-11: qty 8

## 2015-06-11 MED ORDER — PACLITAXEL CHEMO INJECTION 300 MG/50ML
80.0000 mg/m2 | Freq: Once | INTRAVENOUS | Status: AC
  Administered 2015-06-11: 174 mg via INTRAVENOUS
  Filled 2015-06-11: qty 29

## 2015-06-11 MED ORDER — FAMOTIDINE IN NACL 20-0.9 MG/50ML-% IV SOLN
20.0000 mg | Freq: Once | INTRAVENOUS | Status: AC
  Administered 2015-06-11: 20 mg via INTRAVENOUS

## 2015-06-11 MED ORDER — FAMOTIDINE IN NACL 20-0.9 MG/50ML-% IV SOLN
INTRAVENOUS | Status: AC
  Filled 2015-06-11: qty 50

## 2015-06-11 MED ORDER — DIPHENHYDRAMINE HCL 50 MG/ML IJ SOLN
INTRAMUSCULAR | Status: AC
  Filled 2015-06-11: qty 1

## 2015-06-11 MED ORDER — SODIUM CHLORIDE 0.9 % IJ SOLN
10.0000 mL | INTRAMUSCULAR | Status: DC | PRN
  Administered 2015-06-11: 10 mL
  Filled 2015-06-11: qty 10

## 2015-06-11 MED ORDER — SODIUM CHLORIDE 0.9 % IV SOLN
Freq: Once | INTRAVENOUS | Status: AC
  Administered 2015-06-11: 09:00:00 via INTRAVENOUS

## 2015-06-11 MED ORDER — DIPHENHYDRAMINE HCL 50 MG/ML IJ SOLN
50.0000 mg | Freq: Once | INTRAMUSCULAR | Status: AC
  Administered 2015-06-11: 50 mg via INTRAVENOUS

## 2015-06-11 NOTE — Patient Instructions (Signed)
Paclitaxel injection What is this medicine? PACLITAXEL (PAK li TAX el) is a chemotherapy drug. It targets fast dividing cells, like cancer cells, and causes these cells to die. This medicine is used to treat ovarian cancer, breast cancer, and other cancers. This medicine may be used for other purposes; ask your health care provider or pharmacist if you have questions. What should I tell my health care provider before I take this medicine? They need to know if you have any of these conditions: -blood disorders -irregular heartbeat -infection (especially a virus infection such as chickenpox, cold sores, or herpes) -liver disease -previous or ongoing radiation therapy -an unusual or allergic reaction to paclitaxel, alcohol, polyoxyethylated castor oil, other chemotherapy agents, other medicines, foods, dyes, or preservatives -pregnant or trying to get pregnant -breast-feeding How should I use this medicine? This drug is given as an infusion into a vein. It is administered in a hospital or clinic by a specially trained health care professional. Talk to your pediatrician regarding the use of this medicine in children. Special care may be needed. Overdosage: If you think you have taken too much of this medicine contact a poison control center or emergency room at once. NOTE: This medicine is only for you. Do not share this medicine with others. What if I miss a dose? It is important not to miss your dose. Call your doctor or health care professional if you are unable to keep an appointment. What may interact with this medicine? Do not take this medicine with any of the following medications: -disulfiram -metronidazole This medicine may also interact with the following medications: -cyclosporine -diazepam -ketoconazole -medicines to increase blood counts like filgrastim, pegfilgrastim, sargramostim -other chemotherapy drugs like cisplatin, doxorubicin, epirubicin, etoposide, teniposide,  vincristine -quinidine -testosterone -vaccines -verapamil Talk to your doctor or health care professional before taking any of these medicines: -acetaminophen -aspirin -ibuprofen -ketoprofen -naproxen This list may not describe all possible interactions. Give your health care provider a list of all the medicines, herbs, non-prescription drugs, or dietary supplements you use. Also tell them if you smoke, drink alcohol, or use illegal drugs. Some items may interact with your medicine. What should I watch for while using this medicine? Your condition will be monitored carefully while you are receiving this medicine. You will need important blood work done while you are taking this medicine. This drug may make you feel generally unwell. This is not uncommon, as chemotherapy can affect healthy cells as well as cancer cells. Report any side effects. Continue your course of treatment even though you feel ill unless your doctor tells you to stop. This medicine can cause serious allergic reactions. To reduce your risk you will need to take other medicine(s) before treatment with this medicine. In some cases, you may be given additional medicines to help with side effects. Follow all directions for their use. Call your doctor or health care professional for advice if you get a fever, chills or sore throat, or other symptoms of a cold or flu. Do not treat yourself. This drug decreases your body's ability to fight infections. Try to avoid being around people who are sick. This medicine may increase your risk to bruise or bleed. Call your doctor or health care professional if you notice any unusual bleeding. Be careful brushing and flossing your teeth or using a toothpick because you may get an infection or bleed more easily. If you have any dental work done, tell your dentist you are receiving this medicine. Avoid taking products that   contain aspirin, acetaminophen, ibuprofen, naproxen, or ketoprofen unless  instructed by your doctor. These medicines may hide a fever. Do not become pregnant while taking this medicine. Women should inform their doctor if they wish to become pregnant or think they might be pregnant. There is a potential for serious side effects to an unborn child. Talk to your health care professional or pharmacist for more information. Do not breast-feed an infant while taking this medicine. Men are advised not to father a child while receiving this medicine. This product may contain alcohol. Ask your pharmacist or healthcare provider if this medicine contains alcohol. Be sure to tell all healthcare providers you are taking this medicine. Certain medicines, like metronidazole and disulfiram, can cause an unpleasant reaction when taken with alcohol. The reaction includes flushing, headache, nausea, vomiting, sweating, and increased thirst. The reaction can last from 30 minutes to several hours. What side effects may I notice from receiving this medicine? Side effects that you should report to your doctor or health care professional as soon as possible: -allergic reactions like skin rash, itching or hives, swelling of the face, lips, or tongue -low blood counts - This drug may decrease the number of white blood cells, red blood cells and platelets. You may be at increased risk for infections and bleeding. -signs of infection - fever or chills, cough, sore throat, pain or difficulty passing urine -signs of decreased platelets or bleeding - bruising, pinpoint red spots on the skin, black, tarry stools, nosebleeds -signs of decreased red blood cells - unusually weak or tired, fainting spells, lightheadedness -breathing problems -chest pain -high or low blood pressure -mouth sores -nausea and vomiting -pain, swelling, redness or irritation at the injection site -pain, tingling, numbness in the hands or feet -slow or irregular heartbeat -swelling of the ankle, feet, hands Side effects that  usually do not require medical attention (report to your doctor or health care professional if they continue or are bothersome): -bone pain -complete hair loss including hair on your head, underarms, pubic hair, eyebrows, and eyelashes -changes in the color of fingernails -diarrhea -loosening of the fingernails -loss of appetite -muscle or joint pain -red flush to skin -sweating This list may not describe all possible side effects. Call your doctor for medical advice about side effects. You may report side effects to FDA at 1-800-FDA-1088. Where should I keep my medicine? This drug is given in a hospital or clinic and will not be stored at home. NOTE: This sheet is a summary. It may not cover all possible information. If you have questions about this medicine, talk to your doctor, pharmacist, or health care provider.    2016, Elsevier/Gold Standard. (2014-11-06 13:02:56)  

## 2015-06-13 LAB — URINE CULTURE

## 2015-06-14 ENCOUNTER — Other Ambulatory Visit: Payer: Self-pay | Admitting: Oncology

## 2015-06-14 DIAGNOSIS — C569 Malignant neoplasm of unspecified ovary: Secondary | ICD-10-CM

## 2015-06-15 ENCOUNTER — Telehealth: Payer: Self-pay

## 2015-06-15 MED ORDER — AMOXICILLIN 250 MG PO CAPS: 250.0000 mg | ORAL_CAPSULE | Freq: Three times a day (TID) | ORAL | Status: DC

## 2015-06-15 NOTE — Telephone Encounter (Signed)
lvm that UC was positive. We will sent in rx for amoxicillin tid for 5 days

## 2015-06-16 NOTE — Telephone Encounter (Signed)
Called pt confirmed she did get voice message and is taking antibiotic. She confirmed she wipes front to back ( bacteria is e-coli). She is drinking green tea with ginger and plenty of water. She confirmed appt on 3/16.

## 2015-06-18 ENCOUNTER — Telehealth: Payer: Self-pay | Admitting: Oncology

## 2015-06-18 ENCOUNTER — Ambulatory Visit (HOSPITAL_BASED_OUTPATIENT_CLINIC_OR_DEPARTMENT_OTHER): Payer: Medicare Other | Admitting: Oncology

## 2015-06-18 ENCOUNTER — Ambulatory Visit: Payer: Medicare Other

## 2015-06-18 ENCOUNTER — Ambulatory Visit (HOSPITAL_BASED_OUTPATIENT_CLINIC_OR_DEPARTMENT_OTHER): Payer: Medicare Other

## 2015-06-18 ENCOUNTER — Encounter: Payer: Self-pay | Admitting: Oncology

## 2015-06-18 VITALS — BP 129/90 | HR 119 | Temp 97.8°F | Resp 19 | Ht 64.0 in | Wt 228.5 lb

## 2015-06-18 DIAGNOSIS — I158 Other secondary hypertension: Secondary | ICD-10-CM

## 2015-06-18 DIAGNOSIS — C569 Malignant neoplasm of unspecified ovary: Secondary | ICD-10-CM

## 2015-06-18 DIAGNOSIS — Z5112 Encounter for antineoplastic immunotherapy: Secondary | ICD-10-CM | POA: Diagnosis not present

## 2015-06-18 DIAGNOSIS — Z95828 Presence of other vascular implants and grafts: Secondary | ICD-10-CM

## 2015-06-18 DIAGNOSIS — C787 Secondary malignant neoplasm of liver and intrahepatic bile duct: Secondary | ICD-10-CM

## 2015-06-18 DIAGNOSIS — C562 Malignant neoplasm of left ovary: Secondary | ICD-10-CM

## 2015-06-18 DIAGNOSIS — Z5111 Encounter for antineoplastic chemotherapy: Secondary | ICD-10-CM | POA: Diagnosis not present

## 2015-06-18 LAB — CBC WITH DIFFERENTIAL/PLATELET
BASO%: 0 % (ref 0.0–2.0)
BASOS ABS: 0 10*3/uL (ref 0.0–0.1)
EOS ABS: 0 10*3/uL (ref 0.0–0.5)
EOS%: 0 % (ref 0.0–7.0)
HCT: 39.1 % (ref 34.8–46.6)
HEMOGLOBIN: 13 g/dL (ref 11.6–15.9)
LYMPH%: 18.9 % (ref 14.0–49.7)
MCH: 27 pg (ref 25.1–34.0)
MCHC: 33.2 g/dL (ref 31.5–36.0)
MCV: 81.3 fL (ref 79.5–101.0)
MONO#: 0.1 10*3/uL (ref 0.1–0.9)
MONO%: 1.4 % (ref 0.0–14.0)
NEUT#: 4.1 10*3/uL (ref 1.5–6.5)
NEUT%: 79.7 % — ABNORMAL HIGH (ref 38.4–76.8)
Platelets: 243 10*3/uL (ref 145–400)
RBC: 4.81 10*6/uL (ref 3.70–5.45)
RDW: 18.1 % — AB (ref 11.2–14.5)
WBC: 5.1 10*3/uL (ref 3.9–10.3)
lymph#: 1 10*3/uL (ref 0.9–3.3)

## 2015-06-18 LAB — COMPREHENSIVE METABOLIC PANEL
ALK PHOS: 84 U/L (ref 40–150)
ALT: 18 U/L (ref 0–55)
AST: 18 U/L (ref 5–34)
Albumin: 3.6 g/dL (ref 3.5–5.0)
Anion Gap: 10 mEq/L (ref 3–11)
BUN: 24.1 mg/dL (ref 7.0–26.0)
CHLORIDE: 103 meq/L (ref 98–109)
CO2: 24 meq/L (ref 22–29)
Calcium: 10 mg/dL (ref 8.4–10.4)
Creatinine: 1 mg/dL (ref 0.6–1.1)
EGFR: 70 mL/min/{1.73_m2} — AB (ref >90)
GLUCOSE: 124 mg/dL (ref 70–140)
Potassium: 4.2 mEq/L (ref 3.5–5.1)
SODIUM: 136 meq/L (ref 136–145)
Total Bilirubin: 0.3 mg/dL (ref 0.20–1.20)
Total Protein: 7.9 g/dL (ref 6.4–8.3)

## 2015-06-18 MED ORDER — SODIUM CHLORIDE 0.9% FLUSH
10.0000 mL | INTRAVENOUS | Status: DC | PRN
  Administered 2015-06-18: 10 mL via INTRAVENOUS
  Filled 2015-06-18: qty 10

## 2015-06-18 MED ORDER — SODIUM CHLORIDE 0.9 % IV SOLN
Freq: Once | INTRAVENOUS | Status: AC
  Administered 2015-06-18: 12:00:00 via INTRAVENOUS
  Filled 2015-06-18: qty 8

## 2015-06-18 MED ORDER — PACLITAXEL CHEMO INJECTION 300 MG/50ML
80.0000 mg/m2 | Freq: Once | INTRAVENOUS | Status: AC
  Administered 2015-06-18: 174 mg via INTRAVENOUS
  Filled 2015-06-18: qty 29

## 2015-06-18 MED ORDER — DIPHENHYDRAMINE HCL 50 MG/ML IJ SOLN
50.0000 mg | Freq: Once | INTRAMUSCULAR | Status: AC
  Administered 2015-06-18: 50 mg via INTRAVENOUS

## 2015-06-18 MED ORDER — SODIUM CHLORIDE 0.9 % IV SOLN
10.2000 mg/kg | Freq: Once | INTRAVENOUS | Status: AC
  Administered 2015-06-18: 1100 mg via INTRAVENOUS
  Filled 2015-06-18: qty 36

## 2015-06-18 MED ORDER — SODIUM CHLORIDE 0.9 % IJ SOLN
10.0000 mL | INTRAMUSCULAR | Status: DC | PRN
  Administered 2015-06-18: 10 mL
  Filled 2015-06-18: qty 10

## 2015-06-18 MED ORDER — FAMOTIDINE IN NACL 20-0.9 MG/50ML-% IV SOLN
INTRAVENOUS | Status: AC
  Filled 2015-06-18: qty 50

## 2015-06-18 MED ORDER — SODIUM CHLORIDE 0.9 % IV SOLN
Freq: Once | INTRAVENOUS | Status: AC
  Administered 2015-06-18: 12:00:00 via INTRAVENOUS

## 2015-06-18 MED ORDER — DIPHENHYDRAMINE HCL 50 MG/ML IJ SOLN
INTRAMUSCULAR | Status: AC
  Filled 2015-06-18: qty 1

## 2015-06-18 MED ORDER — HEPARIN SOD (PORK) LOCK FLUSH 100 UNIT/ML IV SOLN
500.0000 [IU] | Freq: Once | INTRAVENOUS | Status: AC | PRN
  Administered 2015-06-18: 500 [IU]
  Filled 2015-06-18: qty 5

## 2015-06-18 MED ORDER — FAMOTIDINE IN NACL 20-0.9 MG/50ML-% IV SOLN
20.0000 mg | Freq: Once | INTRAVENOUS | Status: AC
  Administered 2015-06-18: 20 mg via INTRAVENOUS

## 2015-06-18 NOTE — Progress Notes (Signed)
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 Avastin was held with day 1 cycle 4 on 06-04-15 due to elevated BP, that improved since resuming zestoretic. (regimen is taxol days 04-11-13 and avastin days 1-15 every 28 days) Last imaging was CT AP 05-29-15, after cycle 3, with mixed response including liver, tho some areas clearly better and no new disease.   Patient is feeling generally well. She has followed BP at home, reports diastolics now ~ 84. She is completing 5 days of amoxicillin for E coli UTI, no symptoms now. She has had some discomfort in left ear, tho no pain, with antibiotic qtts called in by PCP. She has had no fever, no other symptoms of infection. Bowels are moving regularly. No abdominal or pelvic pain. No HA. No respiratory symptoms. No bleeding. No swelling LE. Minimal peripheral neuropathy not interfering with function.  Remainder of 10 point Review of Systems negative.    PAC in Genetics testing sent 03-02-15, normal by Breast Ovarian Panel by [facility] CA 125 was 1406 in 10-2013 and was 692 as baseline for weekly taxol avastin on 03-12-15 Flu vaccine 01-05-15  ONCOLOGIC HISTORY Patient had acute abdominal pain Dec 2014, which resolved without medical attention, then vague diffuse abdominal discomfort, low grade nausea and abdominal bloating. She was seen in June 2015 for first gyn exam in years, PAP with AGUS and follow up biopsies of endocervix and endometrium by Dr Pamala Hurry both with serous apparent endometrial cancer (pathology from [facility] 09-12-13, case # [id number] to be scanned into this EMR). Biopsy of cervix had normal squamous epithelium. CA125 from [facility] 09-16-13 was 964, and was up to 1406 by 10-23-13.Marland Kitchen She was seen by Dr Denman George on  09-23-13, her exam remarkable for large pelvic mass minimally mobile and extending to umbilicus. She had CT CAP 09-26-13 had prominent but not enlarged juxtapericardiac lymph nodes, trace right pleural effusion, large amount of abnormal soft tissue thruout peritoneal cavity, predominately with omentum and especially LUQ, largest area 3.8 x 3.0 x 4.0 cm anterior to proximal descending colon, implants adjacent to liver, large complex multicystic lesion in pelvis inseperable from uterus and adnexae, with mass effect on bladder, questionable area in central aspect of dome of liver, borderline enlarged retroperitoneal nodes. Due to extent of disease, treatment began with chemotherapy. Cycle one taxol carboplatin was given on 0-56-97, complicated by severe nausea/vomiting, constipation and severe taxol aches. Cycle 2 was changed to dose dense carbo taxol, day 1 on 11-01-13. She needed gCSF support by cycle 3. Neoadjuvant chemo was one cycle carbo taxol standard dose and 3 cycles dose dense. 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 She resumed chemo with cycle 4 on 02-21-14 and completed cycle 6 on 04-25-14. She had unremarkable exam by Dr Denman George 08-11-14, however increase in CA 125 marker then, from 8 in 05-2014 to 26 in 08-2014, and repeat 63 on 09-08-14. CT AP 09-16-14 shows progressive peritoneal metastases, increasing retroperitoneal and transverse mesocolon nodes and soft tissue area at vaginal cuff stable to slightly decreased. She began doxil on 10-10-14, CA 125 151 that day as baseline for doxil. She had 4 cycles of doxil thru 01-09-15, then CT  02-11-15 showed mixed response but with multiple new liver metastases. CA 125 on 02-02-15 was 299. She began weekly taxol with avastin 03-12-15. Restaging CT AP after 3 cycles 05-29-15 showed improvement in omental and vaginal cuff involvement, single liver lesion  slightly larger and right external iliac node increased, no other new disease, other liver areas stable to slightly smaller.  Objective:  Vital signs in last 24 hours:  BP 129/90 mmHg  Pulse 119  Temp(Src) 97.8 F (36.6 C) (Oral)  Resp 19  Ht 5' 4"  (1.626 m)  Wt 228 lb 8 oz (103.647 kg)  BMI 39.20 kg/m2  SpO2 100% Weight down 6 lbs Alert, oriented and appropriate. Ambulatory without assistance.  Wears wig HEENT:PERRL, sclerae not icteric. Oral mucosa moist without lesions, posterior pharynx clear. Dry wax in ear canals bilaterally, no erythema. Neck supple. No JVD.  Lymphatics:no cervical,supraclavicular adenopathy Resp: clear to auscultation bilaterally and normal percussion bilaterally Cardio: regular rate and rhythm. No gallop. GI: abdomen obese, soft, nontender, not distended, no mass or organomegaly. Normally active bowel sounds. Surgical incision not remarkable. Musculoskeletal/ Extremities: without pitting edema, cords, tenderness Neuro: no significant peripheral neuropathy. Otherwise nonfocal. PSYCH appropriate mood and affect Skin without rash, ecchymosis, petechiae Portacath-without erythema or tenderness  Lab Results:  Results for orders placed or performed in visit on 06/18/15  CBC with Differential  Result Value Ref Range   WBC 5.1 3.9 - 10.3 10e3/uL   NEUT# 4.1 1.5 - 6.5 10e3/uL   HGB 13.0 11.6 - 15.9 g/dL   HCT 39.1 34.8 - 46.6 %   Platelets 243 145 - 400 10e3/uL   MCV 81.3 79.5 - 101.0 fL   MCH 27.0 25.1 - 34.0 pg   MCHC 33.2 31.5 - 36.0 g/dL   RBC 4.81 3.70 - 5.45 10e6/uL   RDW 18.1 (H) 11.2 - 14.5 %   lymph# 1.0 0.9 - 3.3 10e3/uL   MONO# 0.1 0.1 - 0.9 10e3/uL   Eosinophils Absolute 0.0 0.0 - 0.5 10e3/uL   Basophils Absolute 0.0 0.0 - 0.1 10e3/uL   NEUT% 79.7 (H) 38.4 - 76.8 %   LYMPH% 18.9 14.0 - 49.7 %   MONO% 1.4 0.0 - 14.0 %   EOS% 0.0 0.0 - 7.0 %   BASO% 0.0 0.0 - 2.0 %  Comprehensive metabolic panel  Result Value Ref Range   Sodium 136 136  - 145 mEq/L   Potassium 4.2 3.5 - 5.1 mEq/L   Chloride 103 98 - 109 mEq/L   CO2 24 22 - 29 mEq/L   Glucose 124 70 - 140 mg/dl   BUN 24.1 7.0 - 26.0 mg/dL   Creatinine 1.0 0.6 - 1.1 mg/dL   Total Bilirubin 0.30 0.20 - 1.20 mg/dL   Alkaline Phosphatase 84 40 - 150 U/L   AST 18 5 - 34 U/L   ALT 18 0 - 55 U/L   Total Protein 7.9 6.4 - 8.3 g/dL   Albumin 3.6 3.5 - 5.0 g/dL   Calcium 10.0 8.4 - 10.4 mg/dL   Anion Gap 10 3 - 11 mEq/L   EGFR 70 (L) >90 ml/min/1.73 m2  CA 125  Result Value Ref Range   Cancer Antigen (CA) 125 143.8 (H) 0.0 - 38.1 U/mL  CA 125 (Parallel Testing)  Result Value Ref Range   CA 125 221 (H) <35 U/mL   CA 125 had been 125 on 05-21-15 by same lab method as 143 now, compared with 213 by "parallel testing then", and 294 by "parallel testing"  on 04-07-15.  Studies/Results: " No results found.  Medications: I have reviewed the patient's current medications. Continue zestoretic. Add back avastin with day 8 cycle 4 now  DISCUSSION Clinically tolerating present regimen well and asymptomatic from the recurrent ovarian cancer, BP improved so will add back avastin now.   Granddaughter's high school graduation is May 25. As long as no concerns, will continue treatment thru late April or early May, then adjust from there depending on the graduation plans.  Assessment/Plan:  1. 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 Recurrent disease found by increasing CA 125 initially 08-2014 . Mixed response to 4 cycles of doxil thru 01-09-15, but multiple new liver mets. Weekly taxol days 1,8,15 every 28 days with avastin days 1 and 15 has given some improvement by CT 05-29-15. Day 15 cycle 4 taxol and avastin today and continue as noted. Genetics testing normal 2.elevated BP: likely in part with avastin, better now with zestoretic, which she has tolerated well in  past. Resume avastin with day 15 cycle 4 today and follow at home and office. 3 E coli UTI symptoms resolved with amoxicillin, to complete 5 days ~ 3-17 4.GI symptoms improved with regular laxatives and H2 blocker, and may be better also with some response to present chemo avastin regimen 5.Anemia better, hemoglobin up to 13 since addition of diuretic 6.neuropathy symptoms from taxol feet>hands essentially resolved after initial taxol, following now back on that drug but no worse 7.does not have Advance Directives per EMR 8.flu vaccine 01-05-15 9.EF good by echocardiogram prior to #4 doxil 10.morbid obesity, BMI 40. She and son are trying to improve her diet 11.intermittent low back discomfort not new, better when not so sedentary. RIght hip pain also likely degenerative and weight related, not complaining of this now 12. PAC in  All questions answered. Chemo and avastin orders confirmed. TIme spent 25 min including >50% counseling and coordination of care.   Zykera Abella P, MD   06/20/2015, 11:39 AM

## 2015-06-18 NOTE — Patient Instructions (Signed)

## 2015-06-18 NOTE — Telephone Encounter (Signed)
Added appt per 3/16 pof. avs printed

## 2015-06-18 NOTE — Patient Instructions (Signed)
[facility] Discharge Instructions for Patients Receiving Chemotherapy  Today you received the following chemotherapy agents: Avastin and Taxol. To help prevent nausea and vomiting after your treatment, we encourage you to take your nausea medication.   If you develop nausea and vomiting that is not controlled by your nausea medication, call the clinic.   BELOW ARE SYMPTOMS THAT SHOULD BE REPORTED IMMEDIATELY:  *FEVER GREATER THAN 100.5 F  *CHILLS WITH OR WITHOUT FEVER  NAUSEA AND VOMITING THAT IS NOT CONTROLLED WITH YOUR NAUSEA MEDICATION  *UNUSUAL SHORTNESS OF BREATH  *UNUSUAL BRUISING OR BLEEDING  TENDERNESS IN MOUTH AND THROAT WITH OR WITHOUT PRESENCE OF ULCERS  *URINARY PROBLEMS  *BOWEL PROBLEMS  UNUSUAL RASH Items with * indicate a potential emergency and should be followed up as soon as possible.  Feel free to call the clinic you have any questions or concerns. The clinic phone number is [phone].  Please show the [place] at check-in to the Emergency Department and triage nurse.

## 2015-06-19 LAB — CANCER ANTIGEN 125 (PARALLEL TESTING): CA 125: 221 U/mL — ABNORMAL HIGH (ref <35)

## 2015-06-19 LAB — CA 125: Cancer Antigen (CA) 125: 143.8 U/mL — ABNORMAL HIGH (ref 0.0–38.1)

## 2015-07-01 ENCOUNTER — Other Ambulatory Visit: Payer: Self-pay | Admitting: *Deleted

## 2015-07-01 DIAGNOSIS — C787 Secondary malignant neoplasm of liver and intrahepatic bile duct: Secondary | ICD-10-CM

## 2015-07-02 ENCOUNTER — Ambulatory Visit (HOSPITAL_BASED_OUTPATIENT_CLINIC_OR_DEPARTMENT_OTHER): Payer: Medicare Other

## 2015-07-02 ENCOUNTER — Ambulatory Visit: Payer: Medicare Other

## 2015-07-02 ENCOUNTER — Other Ambulatory Visit (HOSPITAL_BASED_OUTPATIENT_CLINIC_OR_DEPARTMENT_OTHER): Payer: Medicare Other

## 2015-07-02 VITALS — BP 104/79 | HR 104 | Temp 97.8°F | Resp 18

## 2015-07-02 DIAGNOSIS — C787 Secondary malignant neoplasm of liver and intrahepatic bile duct: Secondary | ICD-10-CM

## 2015-07-02 DIAGNOSIS — C569 Malignant neoplasm of unspecified ovary: Secondary | ICD-10-CM

## 2015-07-02 DIAGNOSIS — Z5111 Encounter for antineoplastic chemotherapy: Secondary | ICD-10-CM | POA: Diagnosis not present

## 2015-07-02 DIAGNOSIS — Z5112 Encounter for antineoplastic immunotherapy: Secondary | ICD-10-CM

## 2015-07-02 DIAGNOSIS — Z95828 Presence of other vascular implants and grafts: Secondary | ICD-10-CM

## 2015-07-02 LAB — COMPREHENSIVE METABOLIC PANEL
ALT: 14 U/L (ref 0–55)
ANION GAP: 10 meq/L (ref 3–11)
AST: 19 U/L (ref 5–34)
Albumin: 3.4 g/dL — ABNORMAL LOW (ref 3.5–5.0)
Alkaline Phosphatase: 82 U/L (ref 40–150)
BUN: 23.4 mg/dL (ref 7.0–26.0)
CHLORIDE: 106 meq/L (ref 98–109)
CO2: 22 meq/L (ref 22–29)
Calcium: 9.7 mg/dL (ref 8.4–10.4)
Creatinine: 0.9 mg/dL (ref 0.6–1.1)
EGFR: 75 mL/min/{1.73_m2} — AB (ref >90)
Glucose: 142 mg/dl — ABNORMAL HIGH (ref 70–140)
Potassium: 4 mEq/L (ref 3.5–5.1)
Sodium: 138 mEq/L (ref 136–145)
Total Bilirubin: <0.3 mg/dL (ref 0.20–1.20)
Total Protein: 7.6 g/dL (ref 6.4–8.3)

## 2015-07-02 LAB — CBC WITH DIFFERENTIAL/PLATELET
BASO%: 0.2 % (ref 0.0–2.0)
Basophils Absolute: 0 10*3/uL (ref 0.0–0.1)
EOS ABS: 0 10*3/uL (ref 0.0–0.5)
EOS%: 0 % (ref 0.0–7.0)
HCT: 37.4 % (ref 34.8–46.6)
HGB: 12 g/dL (ref 11.6–15.9)
LYMPH%: 17.6 % (ref 14.0–49.7)
MCH: 26.5 pg (ref 25.1–34.0)
MCHC: 32.1 g/dL (ref 31.5–36.0)
MCV: 82.7 fL (ref 79.5–101.0)
MONO#: 0.1 10*3/uL (ref 0.1–0.9)
MONO%: 1.9 % (ref 0.0–14.0)
NEUT#: 5 10*3/uL (ref 1.5–6.5)
NEUT%: 80.3 % — AB (ref 38.4–76.8)
PLATELETS: 235 10*3/uL (ref 145–400)
RBC: 4.52 10*6/uL (ref 3.70–5.45)
RDW: 18.3 % — ABNORMAL HIGH (ref 11.2–14.5)
WBC: 6.2 10*3/uL (ref 3.9–10.3)
lymph#: 1.1 10*3/uL (ref 0.9–3.3)

## 2015-07-02 MED ORDER — SODIUM CHLORIDE 0.9 % IV SOLN
80.0000 mg/m2 | Freq: Once | INTRAVENOUS | Status: AC
  Administered 2015-07-02: 174 mg via INTRAVENOUS
  Filled 2015-07-02: qty 29

## 2015-07-02 MED ORDER — HEPARIN SOD (PORK) LOCK FLUSH 100 UNIT/ML IV SOLN
500.0000 [IU] | Freq: Once | INTRAVENOUS | Status: AC | PRN
  Administered 2015-07-02: 500 [IU]
  Filled 2015-07-02: qty 5

## 2015-07-02 MED ORDER — SODIUM CHLORIDE 0.9 % IV SOLN
1100.0000 mg | Freq: Once | INTRAVENOUS | Status: AC
  Administered 2015-07-02: 1100 mg via INTRAVENOUS
  Filled 2015-07-02: qty 36

## 2015-07-02 MED ORDER — FAMOTIDINE IN NACL 20-0.9 MG/50ML-% IV SOLN
20.0000 mg | Freq: Once | INTRAVENOUS | Status: AC
  Administered 2015-07-02: 20 mg via INTRAVENOUS

## 2015-07-02 MED ORDER — DIPHENHYDRAMINE HCL 50 MG/ML IJ SOLN
INTRAMUSCULAR | Status: AC
  Filled 2015-07-02: qty 1

## 2015-07-02 MED ORDER — SODIUM CHLORIDE 0.9 % IJ SOLN
10.0000 mL | INTRAMUSCULAR | Status: DC | PRN
  Administered 2015-07-02: 10 mL
  Filled 2015-07-02: qty 10

## 2015-07-02 MED ORDER — DEXTROSE 5 % IV SOLN: 80.0000 mg/m2 | Freq: Once | INTRAVENOUS | Status: DC

## 2015-07-02 MED ORDER — DIPHENHYDRAMINE HCL 50 MG/ML IJ SOLN
50.0000 mg | Freq: Once | INTRAMUSCULAR | Status: AC
  Administered 2015-07-02: 50 mg via INTRAVENOUS

## 2015-07-02 MED ORDER — SODIUM CHLORIDE 0.9 % IV SOLN
Freq: Once | INTRAVENOUS | Status: AC
  Administered 2015-07-02: 11:00:00 via INTRAVENOUS
  Filled 2015-07-02: qty 8

## 2015-07-02 MED ORDER — SODIUM CHLORIDE 0.9 % IV SOLN
Freq: Once | INTRAVENOUS | Status: AC
  Administered 2015-07-02: 11:00:00 via INTRAVENOUS

## 2015-07-02 MED ORDER — SODIUM CHLORIDE 0.9% FLUSH
10.0000 mL | INTRAVENOUS | Status: DC | PRN
  Administered 2015-07-02: 10 mL via INTRAVENOUS
  Filled 2015-07-02: qty 10

## 2015-07-02 MED ORDER — FAMOTIDINE IN NACL 20-0.9 MG/50ML-% IV SOLN
INTRAVENOUS | Status: AC
Start: 2015-07-02 — End: 2015-07-02
  Filled 2015-07-02: qty 50

## 2015-07-02 NOTE — Patient Instructions (Signed)

## 2015-07-02 NOTE — Progress Notes (Signed)
OK to treat with HR today per MD Marko Plume

## 2015-07-02 NOTE — Patient Instructions (Signed)
[facility] Discharge Instructions for Patients Receiving Chemotherapy  Today you received the following chemotherapy agents Avastin and TAxol. To help prevent nausea and vomiting after your treatment, we encourage you to take your nausea medication as directed.  If you develop nausea and vomiting that is not controlled by your nausea medication, call the clinic.   BELOW ARE SYMPTOMS THAT SHOULD BE REPORTED IMMEDIATELY:  *FEVER GREATER THAN 100.5 F  *CHILLS WITH OR WITHOUT FEVER  NAUSEA AND VOMITING THAT IS NOT CONTROLLED WITH YOUR NAUSEA MEDICATION  *UNUSUAL SHORTNESS OF BREATH  *UNUSUAL BRUISING OR BLEEDING  TENDERNESS IN MOUTH AND THROAT WITH OR WITHOUT PRESENCE OF ULCERS  *URINARY PROBLEMS  *BOWEL PROBLEMS  UNUSUAL RASH Items with * indicate a potential emergency and should be followed up as soon as possible.  Feel free to call the clinic you have any questions or concerns. The clinic phone number is [phone].  Please show the [place] at check-in to the Emergency Department and triage nurse.

## 2015-07-08 ENCOUNTER — Other Ambulatory Visit: Payer: Self-pay | Admitting: Oncology

## 2015-07-09 ENCOUNTER — Telehealth: Payer: Self-pay | Admitting: Oncology

## 2015-07-09 ENCOUNTER — Ambulatory Visit (HOSPITAL_BASED_OUTPATIENT_CLINIC_OR_DEPARTMENT_OTHER): Payer: Medicare Other

## 2015-07-09 ENCOUNTER — Other Ambulatory Visit (HOSPITAL_BASED_OUTPATIENT_CLINIC_OR_DEPARTMENT_OTHER): Payer: Medicare Other

## 2015-07-09 ENCOUNTER — Ambulatory Visit (HOSPITAL_BASED_OUTPATIENT_CLINIC_OR_DEPARTMENT_OTHER): Payer: Medicare Other | Admitting: Oncology

## 2015-07-09 ENCOUNTER — Encounter: Payer: Self-pay | Admitting: Oncology

## 2015-07-09 ENCOUNTER — Ambulatory Visit: Payer: Medicare Other

## 2015-07-09 ENCOUNTER — Ambulatory Visit: Payer: Medicare Other | Admitting: Oncology

## 2015-07-09 ENCOUNTER — Other Ambulatory Visit: Payer: Medicare Other

## 2015-07-09 VITALS — BP 134/87 | HR 110 | Temp 97.2°F | Resp 19 | Ht 64.0 in | Wt 231.2 lb

## 2015-07-09 DIAGNOSIS — Z5111 Encounter for antineoplastic chemotherapy: Secondary | ICD-10-CM | POA: Diagnosis not present

## 2015-07-09 DIAGNOSIS — Z95828 Presence of other vascular implants and grafts: Secondary | ICD-10-CM

## 2015-07-09 DIAGNOSIS — C569 Malignant neoplasm of unspecified ovary: Secondary | ICD-10-CM

## 2015-07-09 DIAGNOSIS — C787 Secondary malignant neoplasm of liver and intrahepatic bile duct: Secondary | ICD-10-CM

## 2015-07-09 DIAGNOSIS — C562 Malignant neoplasm of left ovary: Secondary | ICD-10-CM

## 2015-07-09 LAB — COMPREHENSIVE METABOLIC PANEL
ALBUMIN: 3.5 g/dL (ref 3.5–5.0)
ALK PHOS: 76 U/L (ref 40–150)
ALT: 14 U/L (ref 0–55)
ANION GAP: 9 meq/L (ref 3–11)
AST: 18 U/L (ref 5–34)
BILIRUBIN TOTAL: 0.31 mg/dL (ref 0.20–1.20)
BUN: 18.3 mg/dL (ref 7.0–26.0)
CALCIUM: 9.8 mg/dL (ref 8.4–10.4)
CHLORIDE: 103 meq/L (ref 98–109)
CO2: 24 mEq/L (ref 22–29)
CREATININE: 0.9 mg/dL (ref 0.6–1.1)
EGFR: 76 mL/min/{1.73_m2} — ABNORMAL LOW (ref >90)
Glucose: 125 mg/dl (ref 70–140)
Potassium: 4 mEq/L (ref 3.5–5.1)
Sodium: 137 mEq/L (ref 136–145)
Total Protein: 7.7 g/dL (ref 6.4–8.3)

## 2015-07-09 LAB — CBC WITH DIFFERENTIAL/PLATELET
BASO%: 0.3 % (ref 0.0–2.0)
BASOS ABS: 0 10*3/uL (ref 0.0–0.1)
EOS%: 0 % (ref 0.0–7.0)
Eosinophils Absolute: 0 10*3/uL (ref 0.0–0.5)
HEMATOCRIT: 37.4 % (ref 34.8–46.6)
HEMOGLOBIN: 12.1 g/dL (ref 11.6–15.9)
LYMPH#: 1 10*3/uL (ref 0.9–3.3)
LYMPH%: 11.6 % — ABNORMAL LOW (ref 14.0–49.7)
MCH: 26.8 pg (ref 25.1–34.0)
MCHC: 32.4 g/dL (ref 31.5–36.0)
MCV: 82.7 fL (ref 79.5–101.0)
MONO#: 0.1 10*3/uL (ref 0.1–0.9)
MONO%: 0.8 % (ref 0.0–14.0)
NEUT#: 7.2 10*3/uL — ABNORMAL HIGH (ref 1.5–6.5)
NEUT%: 87.3 % — AB (ref 38.4–76.8)
PLATELETS: 235 10*3/uL (ref 145–400)
RBC: 4.52 10*6/uL (ref 3.70–5.45)
RDW: 18.7 % — AB (ref 11.2–14.5)
WBC: 8.3 10*3/uL (ref 3.9–10.3)

## 2015-07-09 MED ORDER — SODIUM CHLORIDE 0.9 % IV SOLN
Freq: Once | INTRAVENOUS | Status: AC
  Administered 2015-07-09: 11:00:00 via INTRAVENOUS
  Filled 2015-07-09: qty 8

## 2015-07-09 MED ORDER — PACLITAXEL CHEMO INJECTION 300 MG/50ML
80.0000 mg/m2 | Freq: Once | INTRAVENOUS | Status: AC
  Administered 2015-07-09: 174 mg via INTRAVENOUS
  Filled 2015-07-09: qty 29

## 2015-07-09 MED ORDER — SODIUM CHLORIDE 0.9 % IJ SOLN
10.0000 mL | INTRAMUSCULAR | Status: DC | PRN
  Administered 2015-07-09: 10 mL
  Filled 2015-07-09: qty 10

## 2015-07-09 MED ORDER — SODIUM CHLORIDE 0.9 % IV SOLN
Freq: Once | INTRAVENOUS | Status: AC
  Administered 2015-07-09: 11:00:00 via INTRAVENOUS

## 2015-07-09 MED ORDER — SODIUM CHLORIDE 0.9% FLUSH
10.0000 mL | INTRAVENOUS | Status: DC | PRN
  Administered 2015-07-09: 10 mL via INTRAVENOUS
  Filled 2015-07-09: qty 10

## 2015-07-09 MED ORDER — HEPARIN SOD (PORK) LOCK FLUSH 100 UNIT/ML IV SOLN
500.0000 [IU] | Freq: Once | INTRAVENOUS | Status: AC | PRN
  Administered 2015-07-09: 500 [IU]
  Filled 2015-07-09: qty 5

## 2015-07-09 MED ORDER — FAMOTIDINE IN NACL 20-0.9 MG/50ML-% IV SOLN
20.0000 mg | Freq: Once | INTRAVENOUS | Status: AC
  Administered 2015-07-09: 20 mg via INTRAVENOUS

## 2015-07-09 MED ORDER — FAMOTIDINE IN NACL 20-0.9 MG/50ML-% IV SOLN
INTRAVENOUS | Status: AC
  Filled 2015-07-09: qty 50

## 2015-07-09 MED ORDER — DIPHENHYDRAMINE HCL 50 MG/ML IJ SOLN
50.0000 mg | Freq: Once | INTRAMUSCULAR | Status: AC
  Administered 2015-07-09: 50 mg via INTRAVENOUS

## 2015-07-09 MED ORDER — DIPHENHYDRAMINE HCL 50 MG/ML IJ SOLN
INTRAMUSCULAR | Status: AC
  Filled 2015-07-09: qty 1

## 2015-07-09 NOTE — Patient Instructions (Signed)
[facility] Discharge Instructions for Patients Receiving Chemotherapy  Today you received the following chemotherapy agents taxol  To help prevent nausea and vomiting after your treatment, we encourage you to take your nausea medication as directed   If you develop nausea and vomiting that is not controlled by your nausea medication, call the clinic.   BELOW ARE SYMPTOMS THAT SHOULD BE REPORTED IMMEDIATELY:  *FEVER GREATER THAN 100.5 F  *CHILLS WITH OR WITHOUT FEVER  NAUSEA AND VOMITING THAT IS NOT CONTROLLED WITH YOUR NAUSEA MEDICATION  *UNUSUAL SHORTNESS OF BREATH  *UNUSUAL BRUISING OR BLEEDING  TENDERNESS IN MOUTH AND THROAT WITH OR WITHOUT PRESENCE OF ULCERS  *URINARY PROBLEMS  *BOWEL PROBLEMS  UNUSUAL RASH Items with * indicate a potential emergency and should be followed up as soon as possible.  Feel free to call the clinic you have any questions or concerns. The clinic phone number is [phone].  

## 2015-07-09 NOTE — Progress Notes (Signed)
OFFICE PROGRESS NOTE   July 12, 2015   Physicians: Everitt Amber ,Biagio Borg, MD , Aloha Gell  INTERVAL HISTORY:  Patient is seen, together with niece, in continuing attention to metastatic left ovarian cancer for which she continues treatment with taxol and avastin, taxol given days 04-11-13 and avastin days 1 and 15 every 28 days. Last CT was 05-29-15 after cycle 3. She is due day 1 cycle 6 today.    Patient was unusually fatigued last week, tho felt better by late yesterday prior to taking premed steroids last PM. She has had no bleeding, appetite ok, bowels moving, no bleeding, no new or different pain, no SOB tho possibly some symptoms of environmental allergies, no fever or symptoms of infection, no significant nausea, no LE swelling  No problems with PAC.    PAC in Genetics testing sent 03-02-15, normal by Breast Ovarian Panel by [facility] CA 125 was 1406 in 10-2013 and was 692 as baseline for weekly taxol avastin on 03-12-15 Flu vaccine 01-05-15  Granddaughter's high school graduation is May 25 in [place].  ONCOLOGIC HISTORY Patient had acute abdominal pain Dec 2014, which resolved without medical attention, then vague diffuse abdominal discomfort, low grade nausea and abdominal bloating. She was seen in June 2015 for first gyn exam in years, PAP with AGUS and follow up biopsies of endocervix and endometrium by Dr Pamala Hurry both with serous apparent endometrial cancer (pathology from [facility] 09-12-13, case # [id number] to be scanned into this EMR). Biopsy of cervix had normal squamous epithelium. CA125 from [facility] 09-16-13 was 964, and was up to 1406 by 10-23-13.Marland Kitchen She was seen by Dr Denman George on 09-23-13, her exam remarkable for large pelvic mass minimally mobile and extending to umbilicus. She had CT CAP 09-26-13 had prominent but not enlarged juxtapericardiac lymph nodes, trace right pleural effusion, large amount of abnormal soft tissue thruout peritoneal cavity, predominately  with omentum and especially LUQ, largest area 3.8 x 3.0 x 4.0 cm anterior to proximal descending colon, implants adjacent to liver, large complex multicystic lesion in pelvis inseperable from uterus and adnexae, with mass effect on bladder, questionable area in central aspect of dome of liver, borderline enlarged retroperitoneal nodes. Due to extent of disease, treatment began with chemotherapy. Cycle one taxol carboplatin was given on 11-13-73, complicated by severe nausea/vomiting, constipation and severe taxol aches. Cycle 2 was changed to dose dense carbo taxol, day 1 on 11-01-13. She needed gCSF support by cycle 3. Neoadjuvant chemo was one cycle carbo taxol standard dose and 3 cycles dose dense. 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 She resumed chemo with cycle 4 on 02-21-14 and completed cycle 6 on 04-25-14. She had unremarkable exam by Dr Denman George 08-11-14, however increase in CA 125 marker then, from 8 in 05-2014 to 26 in 08-2014, and repeat 63 on 09-08-14. CT AP 09-16-14 shows progressive peritoneal metastases, increasing retroperitoneal and transverse mesocolon nodes and soft tissue area at vaginal cuff stable to slightly decreased. She began doxil on 10-10-14, CA 125 151 that day as baseline for doxil. She had 4 cycles of doxil thru 01-09-15, then CT 02-11-15 showed mixed response but with multiple new liver metastases. CA 125 on 02-02-15 was 299. She began weekly taxol with avastin 03-12-15. Restaging CT AP after 3 cycles 05-29-15 showed improvement in omental and vaginal cuff involvement, single liver lesion slightly larger and right external  iliac node increased, no other new disease, other liver areas stable to slightly smaller.    Objective:  Vital signs in last 24 hours:  BP 134/87 mmHg  Pulse 110  Temp(Src) 97.2 F (36.2 C) (Oral)  Resp 19  Ht _0  (1.626 m)  Wt 231 lb 3.2  oz (104.872 kg)  BMI 39.67 kg/m2  SpO2 100% Weight up 3 lbs Alert, oriented and appropriate. Ambulatory without assistance. Talkative and in good spirits now, note had premed steroids Wears wig  HEENT:PERRL, sclerae not icteric. Oral mucosa moist without lesions, posterior pharynx minimal bilateral erythema without exudate Neck supple. No JVD.  Lymphatics:no cervical,supraclavicular adenopathy Resp: clear to auscultation bilaterally and normal percussion bilaterally Cardio: regular rate and rhythm. No gallop. GI: abdomen obese, soft, nontender, not distended, no appreciable mass or organomegaly. Normally active bowel sounds. Surgical incision not remarkable. Musculoskeletal/ Extremities: without pitting edema, cords, tenderness Neuro: no increase in peripheral neuropathy. Otherwise nonfocal. PSYCH appropriate mood and affect Skin without rash, ecchymosis, petechiae Portacath-without erythema or tenderness  Lab Results:  Results for orders placed or performed in visit on 07/09/15  CBC with Differential  Result Value Ref Range   WBC 8.3 3.9 - 10.3 10e3/uL   NEUT# 7.2 (H) 1.5 - 6.5 10e3/uL   HGB 12.1 11.6 - 15.9 g/dL   HCT 37.4 34.8 - 46.6 %   Platelets 235 145 - 400 10e3/uL   MCV 82.7 79.5 - 101.0 fL   MCH 26.8 25.1 - 34.0 pg   MCHC 32.4 31.5 - 36.0 g/dL   RBC 4.52 3.70 - 5.45 10e6/uL   RDW 18.7 (H) 11.2 - 14.5 %   lymph# 1.0 0.9 - 3.3 10e3/uL   MONO# 0.1 0.1 - 0.9 10e3/uL   Eosinophils Absolute 0.0 0.0 - 0.5 10e3/uL   Basophils Absolute 0.0 0.0 - 0.1 10e3/uL   NEUT% 87.3 (H) 38.4 - 76.8 %   LYMPH% 11.6 (L) 14.0 - 49.7 %   MONO% 0.8 0.0 - 14.0 %   EOS% 0.0 0.0 - 7.0 %   BASO% 0.3 0.0 - 2.0 %  Comprehensive metabolic panel  Result Value Ref Range   Sodium 137 136 - 145 mEq/L   Potassium 4.0 3.5 - 5.1 mEq/L   Chloride 103 98 - 109 mEq/L   CO2 24 22 - 29 mEq/L   Glucose 125 70 - 140 mg/dl   BUN 18.3 7.0 - 26.0 mg/dL   Creatinine 0.9 0.6 - 1.1 mg/dL   Total Bilirubin 0.31  0.20 - 1.20 mg/dL   Alkaline Phosphatase 76 40 - 150 U/L   AST 18 5 - 34 U/L   ALT 14 0 - 55 U/L   Total Protein 7.7 6.4 - 8.3 g/dL   Albumin 3.5 3.5 - 5.0 g/dL   Calcium 9.8 8.4 - 10.4 mg/dL   Anion Gap 9 3 - 11 mEq/L   EGFR 76 (L) >90 ml/min/1.73 m2  CA 125  Result Value Ref Range   Cancer Antigen (CA) 125 165.2 (H) 0.0 - 38.1 U/mL   By equivalent testing, CA 125 143 on 3-16 and 125 on 05-21-15 (by prior lab method 221 on 3-16 and 213 on 2-16, compared with 692 as baseline for taxol avastin). Marker result as above available after visit.   All labs other than CA 125 reviewed with patient and niece at time of visit.   Studies/Results:  No results found.  Medications: I have reviewed the patient's current medications.  DISCUSSION At time of visit, we  discussed continuing present taxol avastin thru early May, then ~ 2 week break prior to granddaughter's graduation, with repeat scans and further chemo after the graduation.   MD considerations: More fatigue this week, not clear if related to treatment, environmental allergies, disease.  Marker slightly higher after visit today. Will repeat this on 4-27. If continues to rise, may need to repeat scans sooner.  She has not had gemzar, oral VP16, Alimta. Tumor diffusely and strongly + for ER/ weakly + PR on surgical path from 01-2014. Germline BRCA negative.   Assessment/Plan:  1. 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 Recurrent disease found by increasing CA 125 initially 08-2014 . Mixed response to 4 cycles of doxil thru 01-09-15, but multiple new liver mets. Weekly taxol days 1,8,15 every 28 days with avastin days 1 and 15, some improvement by CT 05-29-15. Day 1 cycle 5 taxol and avastin today and continue as noted. Follow marker, repeat scans at least after cycle 6. See above for other treatment  considerations. Genetics testing normal 2.HTN: likely in part with avastin, better now with zestoretic, which she was on in past. Follow on zestoretic 3. PAC in 4. intermittent low back discomfort not new, better when not so sedentary. RIght hip pain also likely degenerative and weight related, not complaining of this now 5.Anemia improved 6.neuropathy symptoms from taxol feet>hands essentially resolved after initial taxol, following now back on that drug but no worse 7.does not have Advance Directives per EMR 8.flu vaccine 01-05-15 9.EF good by echocardiogram prior to #4 doxil 10.morbid obesity, BMI 40.   All questions answered and they know to call prior to next scheduled visit if needed. Chemo and avastin orders confirmed. Time spent 25 min including > 50% counseling and coordination of care   Geraldean Walen P, MD   07/12/2015, 9:11 AM

## 2015-07-09 NOTE — Telephone Encounter (Signed)
appt made and avs printed. CT to be sch by central radiology °

## 2015-07-09 NOTE — Patient Instructions (Signed)

## 2015-07-10 LAB — CA 125: Cancer Antigen (CA) 125: 165.2 U/mL — ABNORMAL HIGH (ref 0.0–38.1)

## 2015-07-12 ENCOUNTER — Other Ambulatory Visit: Payer: Self-pay | Admitting: Oncology

## 2015-07-12 DIAGNOSIS — C569 Malignant neoplasm of unspecified ovary: Secondary | ICD-10-CM

## 2015-07-14 ENCOUNTER — Encounter: Payer: Self-pay | Admitting: Gastroenterology

## 2015-07-16 ENCOUNTER — Other Ambulatory Visit (HOSPITAL_BASED_OUTPATIENT_CLINIC_OR_DEPARTMENT_OTHER): Payer: Medicare Other

## 2015-07-16 ENCOUNTER — Other Ambulatory Visit: Payer: Self-pay | Admitting: *Deleted

## 2015-07-16 ENCOUNTER — Ambulatory Visit (HOSPITAL_BASED_OUTPATIENT_CLINIC_OR_DEPARTMENT_OTHER): Payer: Medicare Other

## 2015-07-16 ENCOUNTER — Ambulatory Visit: Payer: Medicare Other

## 2015-07-16 VITALS — BP 101/77 | HR 107 | Temp 97.2°F | Resp 18

## 2015-07-16 DIAGNOSIS — Z5111 Encounter for antineoplastic chemotherapy: Secondary | ICD-10-CM

## 2015-07-16 DIAGNOSIS — C562 Malignant neoplasm of left ovary: Secondary | ICD-10-CM

## 2015-07-16 DIAGNOSIS — Z5112 Encounter for antineoplastic immunotherapy: Secondary | ICD-10-CM | POA: Diagnosis not present

## 2015-07-16 DIAGNOSIS — C569 Malignant neoplasm of unspecified ovary: Secondary | ICD-10-CM

## 2015-07-16 DIAGNOSIS — Z95828 Presence of other vascular implants and grafts: Secondary | ICD-10-CM

## 2015-07-16 LAB — COMPREHENSIVE METABOLIC PANEL
ALT: 14 U/L (ref 0–55)
AST: 20 U/L (ref 5–34)
Albumin: 3.5 g/dL (ref 3.5–5.0)
Alkaline Phosphatase: 72 U/L (ref 40–150)
Anion Gap: 13 mEq/L — ABNORMAL HIGH (ref 3–11)
BILIRUBIN TOTAL: 0.3 mg/dL (ref 0.20–1.20)
BUN: 29.3 mg/dL — ABNORMAL HIGH (ref 7.0–26.0)
CHLORIDE: 106 meq/L (ref 98–109)
CO2: 20 meq/L — AB (ref 22–29)
CREATININE: 1.1 mg/dL (ref 0.6–1.1)
Calcium: 10.1 mg/dL (ref 8.4–10.4)
EGFR: 64 mL/min/{1.73_m2} — AB (ref >90)
GLUCOSE: 134 mg/dL (ref 70–140)
Potassium: 3.9 mEq/L (ref 3.5–5.1)
SODIUM: 138 meq/L (ref 136–145)
TOTAL PROTEIN: 7.6 g/dL (ref 6.4–8.3)

## 2015-07-16 LAB — CBC WITH DIFFERENTIAL/PLATELET
BASO%: 0 % (ref 0.0–2.0)
Basophils Absolute: 0 10*3/uL (ref 0.0–0.1)
EOS%: 0 % (ref 0.0–7.0)
Eosinophils Absolute: 0 10*3/uL (ref 0.0–0.5)
HCT: 36.2 % (ref 34.8–46.6)
HGB: 11.9 g/dL (ref 11.6–15.9)
LYMPH%: 14.8 % (ref 14.0–49.7)
MCH: 27 pg (ref 25.1–34.0)
MCHC: 32.9 g/dL (ref 31.5–36.0)
MCV: 82.3 fL (ref 79.5–101.0)
MONO#: 0.1 10*3/uL (ref 0.1–0.9)
MONO%: 0.9 % (ref 0.0–14.0)
NEUT%: 84.3 % — ABNORMAL HIGH (ref 38.4–76.8)
NEUTROS ABS: 4.7 10*3/uL (ref 1.5–6.5)
Platelets: 241 10*3/uL (ref 145–400)
RBC: 4.4 10*6/uL (ref 3.70–5.45)
RDW: 18 % — ABNORMAL HIGH (ref 11.2–14.5)
WBC: 5.6 10*3/uL (ref 3.9–10.3)
lymph#: 0.8 10*3/uL — ABNORMAL LOW (ref 0.9–3.3)

## 2015-07-16 LAB — UA PROTEIN, DIPSTICK - CHCC: Protein, ur: NEGATIVE mg/dL

## 2015-07-16 MED ORDER — SODIUM CHLORIDE 0.9 % IV SOLN
Freq: Once | INTRAVENOUS | Status: AC
  Administered 2015-07-16: 11:00:00 via INTRAVENOUS
  Filled 2015-07-16: qty 8

## 2015-07-16 MED ORDER — DIPHENHYDRAMINE HCL 50 MG/ML IJ SOLN
INTRAMUSCULAR | Status: AC
  Filled 2015-07-16: qty 1

## 2015-07-16 MED ORDER — SODIUM CHLORIDE 0.9 % IV SOLN: Freq: Once | INTRAVENOUS | Status: DC

## 2015-07-16 MED ORDER — SODIUM CHLORIDE 0.9 % IV SOLN
10.2500 mg/kg | Freq: Once | INTRAVENOUS | Status: AC
  Administered 2015-07-16: 1100 mg via INTRAVENOUS
  Filled 2015-07-16: qty 12

## 2015-07-16 MED ORDER — HEPARIN SOD (PORK) LOCK FLUSH 100 UNIT/ML IV SOLN
500.0000 [IU] | Freq: Once | INTRAVENOUS | Status: AC | PRN
  Administered 2015-07-16: 500 [IU]
  Filled 2015-07-16: qty 5

## 2015-07-16 MED ORDER — FAMOTIDINE IN NACL 20-0.9 MG/50ML-% IV SOLN
20.0000 mg | Freq: Once | INTRAVENOUS | Status: AC
  Administered 2015-07-16: 20 mg via INTRAVENOUS

## 2015-07-16 MED ORDER — SODIUM CHLORIDE 0.9 % IV SOLN
Freq: Once | INTRAVENOUS | Status: AC
  Administered 2015-07-16: 11:00:00 via INTRAVENOUS

## 2015-07-16 MED ORDER — PACLITAXEL CHEMO INJECTION 300 MG/50ML
80.0000 mg/m2 | Freq: Once | INTRAVENOUS | Status: AC
  Administered 2015-07-16: 174 mg via INTRAVENOUS
  Filled 2015-07-16: qty 29

## 2015-07-16 MED ORDER — DIPHENHYDRAMINE HCL 50 MG/ML IJ SOLN
50.0000 mg | Freq: Once | INTRAMUSCULAR | Status: AC
  Administered 2015-07-16: 50 mg via INTRAVENOUS

## 2015-07-16 MED ORDER — SODIUM CHLORIDE 0.9 % IJ SOLN
10.0000 mL | INTRAMUSCULAR | Status: DC | PRN
  Administered 2015-07-16: 10 mL
  Filled 2015-07-16: qty 10

## 2015-07-16 MED ORDER — SODIUM CHLORIDE 0.9% FLUSH
10.0000 mL | INTRAVENOUS | Status: DC | PRN
  Administered 2015-07-16: 10 mL via INTRAVENOUS
  Filled 2015-07-16: qty 10

## 2015-07-16 MED ORDER — FAMOTIDINE IN NACL 20-0.9 MG/50ML-% IV SOLN
INTRAVENOUS | Status: AC
  Filled 2015-07-16: qty 50

## 2015-07-16 NOTE — Patient Instructions (Signed)

## 2015-07-16 NOTE — Patient Instructions (Signed)
[facility]charge Inst[facility] for Patients Receiving Chemotherapy  Today you received the following chemotherapy agents: Avastin and Taxol   To help prevent nausea and vomiting after your treatment, we encourage you to take your nausea medication as directed.   If you develop nausea and vomiting that is not controlled by your nausea medication, call the clinic.   BELOW ARE SYMPTOMS THAT SHOULD BE REPORTED IMMEDIATELY:  *FEVER GREATER THAN 100.5 F  *CHILLS WITH OR WITHOUT FEVER  NAUSEA AND VOMITING THAT IS NOT CONTROLLED WITH YOUR NAUSEA MEDICATION  *UNUSUAL SHORTNESS OF BREATH  *UNUSUAL BRUISING OR BLEEDING  TENDERNESS IN MOUTH AND THROAT WITH OR WITHOUT PRESENCE OF ULCERS  *URINARY PROBLEMS  *BOWEL PROBLEMS  UNUSUAL RASH Items with * indicate a potential emergency and should be followed up as soon as possible.  Feel free to call the clinic you have any questions or concerns. The clinic phone number is [phone].  Please show the CHEMO ALERT CARD at check-in to the Emergency Department and triage nurse.   

## 2015-07-26 ENCOUNTER — Other Ambulatory Visit: Payer: Self-pay | Admitting: Oncology

## 2015-07-26 DIAGNOSIS — C562 Malignant neoplasm of left ovary: Secondary | ICD-10-CM

## 2015-07-28 ENCOUNTER — Other Ambulatory Visit: Payer: Self-pay | Admitting: Internal Medicine

## 2015-07-28 ENCOUNTER — Other Ambulatory Visit: Payer: Self-pay

## 2015-07-28 ENCOUNTER — Encounter: Payer: Self-pay | Admitting: *Deleted

## 2015-07-28 DIAGNOSIS — C569 Malignant neoplasm of unspecified ovary: Secondary | ICD-10-CM

## 2015-07-28 MED ORDER — DEXAMETHASONE 4 MG PO TABS: ORAL_TABLET | ORAL | Status: DC

## 2015-07-28 NOTE — Telephone Encounter (Signed)
Ok to refill decadron   Thank you

## 2015-07-28 NOTE — Telephone Encounter (Signed)
Patient is requesting a refill on her dexamethasone and would like to pick it up on Wednesday  so she can take it for her treatment on Thursday .  She is requesting for it to be sent in to her [facility].

## 2015-07-28 NOTE — Telephone Encounter (Signed)
Refilled Dexamethasone for Melissa Ball.  Left a message in her voice mail stating refill sent to [facility].

## 2015-07-28 NOTE — Addendum Note (Signed)
Addended by: Baruch Merl on: 07/28/2015 03:29 PM   Modules accepted: Orders

## 2015-07-30 ENCOUNTER — Ambulatory Visit: Payer: Medicare Other

## 2015-07-30 ENCOUNTER — Other Ambulatory Visit (HOSPITAL_BASED_OUTPATIENT_CLINIC_OR_DEPARTMENT_OTHER): Payer: Medicare Other

## 2015-07-30 ENCOUNTER — Ambulatory Visit (HOSPITAL_BASED_OUTPATIENT_CLINIC_OR_DEPARTMENT_OTHER): Payer: Medicare Other

## 2015-07-30 ENCOUNTER — Ambulatory Visit (HOSPITAL_BASED_OUTPATIENT_CLINIC_OR_DEPARTMENT_OTHER): Payer: Medicare Other | Admitting: Oncology

## 2015-07-30 ENCOUNTER — Encounter: Payer: Self-pay | Admitting: Oncology

## 2015-07-30 VITALS — BP 139/72 | HR 113 | Temp 97.5°F | Resp 18 | Wt 232.8 lb

## 2015-07-30 VITALS — BP 105/75

## 2015-07-30 DIAGNOSIS — Z95828 Presence of other vascular implants and grafts: Secondary | ICD-10-CM

## 2015-07-30 DIAGNOSIS — Z5112 Encounter for antineoplastic immunotherapy: Secondary | ICD-10-CM | POA: Diagnosis not present

## 2015-07-30 DIAGNOSIS — C569 Malignant neoplasm of unspecified ovary: Secondary | ICD-10-CM

## 2015-07-30 DIAGNOSIS — C562 Malignant neoplasm of left ovary: Secondary | ICD-10-CM

## 2015-07-30 DIAGNOSIS — D649 Anemia, unspecified: Secondary | ICD-10-CM

## 2015-07-30 DIAGNOSIS — I158 Other secondary hypertension: Secondary | ICD-10-CM | POA: Diagnosis not present

## 2015-07-30 DIAGNOSIS — C787 Secondary malignant neoplasm of liver and intrahepatic bile duct: Secondary | ICD-10-CM | POA: Diagnosis not present

## 2015-07-30 DIAGNOSIS — Z5111 Encounter for antineoplastic chemotherapy: Secondary | ICD-10-CM | POA: Diagnosis not present

## 2015-07-30 LAB — COMPREHENSIVE METABOLIC PANEL
ALT: 19 U/L (ref 0–55)
ANION GAP: 11 meq/L (ref 3–11)
AST: 21 U/L (ref 5–34)
Albumin: 3.5 g/dL (ref 3.5–5.0)
Alkaline Phosphatase: 76 U/L (ref 40–150)
BILIRUBIN TOTAL: 0.33 mg/dL (ref 0.20–1.20)
BUN: 24.3 mg/dL (ref 7.0–26.0)
CALCIUM: 10 mg/dL (ref 8.4–10.4)
CO2: 24 mEq/L (ref 22–29)
CREATININE: 1 mg/dL (ref 0.6–1.1)
Chloride: 104 mEq/L (ref 98–109)
EGFR: 70 mL/min/{1.73_m2} — ABNORMAL LOW (ref >90)
Glucose: 121 mg/dl (ref 70–140)
Potassium: 4.2 mEq/L (ref 3.5–5.1)
Sodium: 139 mEq/L (ref 136–145)
TOTAL PROTEIN: 7.7 g/dL (ref 6.4–8.3)

## 2015-07-30 LAB — CBC WITH DIFFERENTIAL/PLATELET
BASO%: 0.1 % (ref 0.0–2.0)
Basophils Absolute: 0 10*3/uL (ref 0.0–0.1)
EOS ABS: 0 10*3/uL (ref 0.0–0.5)
EOS%: 0 % (ref 0.0–7.0)
HEMATOCRIT: 37.8 % (ref 34.8–46.6)
HGB: 12.4 g/dL (ref 11.6–15.9)
LYMPH%: 15.1 % (ref 14.0–49.7)
MCH: 27.1 pg (ref 25.1–34.0)
MCHC: 32.8 g/dL (ref 31.5–36.0)
MCV: 82.5 fL (ref 79.5–101.0)
MONO#: 0.1 10*3/uL (ref 0.1–0.9)
MONO%: 1.9 % (ref 0.0–14.0)
NEUT%: 82.9 % — ABNORMAL HIGH (ref 38.4–76.8)
NEUTROS ABS: 6.2 10*3/uL (ref 1.5–6.5)
NRBC: 0 % (ref 0–0)
PLATELETS: 262 10*3/uL (ref 145–400)
RBC: 4.58 10*6/uL (ref 3.70–5.45)
RDW: 17.7 % — AB (ref 11.2–14.5)
WBC: 7.5 10*3/uL (ref 3.9–10.3)
lymph#: 1.1 10*3/uL (ref 0.9–3.3)

## 2015-07-30 LAB — UA PROTEIN, DIPSTICK - CHCC: PROTEIN: NEGATIVE mg/dL

## 2015-07-30 MED ORDER — FAMOTIDINE IN NACL 20-0.9 MG/50ML-% IV SOLN
20.0000 mg | Freq: Once | INTRAVENOUS | Status: AC
  Administered 2015-07-30: 20 mg via INTRAVENOUS

## 2015-07-30 MED ORDER — SODIUM CHLORIDE 0.9 % IV SOLN
80.0000 mg/m2 | Freq: Once | INTRAVENOUS | Status: AC
  Administered 2015-07-30: 174 mg via INTRAVENOUS
  Filled 2015-07-30: qty 29

## 2015-07-30 MED ORDER — DIPHENHYDRAMINE HCL 50 MG/ML IJ SOLN
INTRAMUSCULAR | Status: AC
Start: 2015-07-30 — End: 2015-07-30
  Filled 2015-07-30: qty 1

## 2015-07-30 MED ORDER — SODIUM CHLORIDE 0.9% FLUSH
10.0000 mL | INTRAVENOUS | Status: DC | PRN
  Administered 2015-07-30: 10 mL via INTRAVENOUS
  Filled 2015-07-30: qty 10

## 2015-07-30 MED ORDER — SODIUM CHLORIDE 0.9 % IV SOLN
Freq: Once | INTRAVENOUS | Status: AC
  Administered 2015-07-30: 11:00:00 via INTRAVENOUS
  Filled 2015-07-30: qty 8

## 2015-07-30 MED ORDER — SODIUM CHLORIDE 0.9 % IV SOLN
10.2000 mg/kg | Freq: Once | INTRAVENOUS | Status: AC
  Administered 2015-07-30: 1100 mg via INTRAVENOUS
  Filled 2015-07-30: qty 36

## 2015-07-30 MED ORDER — SODIUM CHLORIDE 0.9 % IV SOLN
Freq: Once | INTRAVENOUS | Status: AC
  Administered 2015-07-30: 11:00:00 via INTRAVENOUS

## 2015-07-30 MED ORDER — FAMOTIDINE IN NACL 20-0.9 MG/50ML-% IV SOLN
INTRAVENOUS | Status: AC
  Filled 2015-07-30: qty 50

## 2015-07-30 MED ORDER — SODIUM CHLORIDE 0.9 % IJ SOLN
10.0000 mL | INTRAMUSCULAR | Status: DC | PRN
  Administered 2015-07-30: 10 mL
  Filled 2015-07-30: qty 10

## 2015-07-30 MED ORDER — DIPHENHYDRAMINE HCL 50 MG/ML IJ SOLN
50.0000 mg | Freq: Once | INTRAMUSCULAR | Status: AC
  Administered 2015-07-30: 50 mg via INTRAVENOUS

## 2015-07-30 MED ORDER — HEPARIN SOD (PORK) LOCK FLUSH 100 UNIT/ML IV SOLN
500.0000 [IU] | Freq: Once | INTRAVENOUS | Status: AC | PRN
  Administered 2015-07-30: 500 [IU]
  Filled 2015-07-30: qty 5

## 2015-07-30 NOTE — Patient Instructions (Signed)

## 2015-07-30 NOTE — Patient Instructions (Signed)
[facility] Discharge Instructions for Patients Receiving Chemotherapy  Today you received the following chemotherapy agents Avastin, Taxol.   To help prevent nausea and vomiting after your treatment, we encourage you to take your nausea medication as prescribed.   If you develop nausea and vomiting that is not controlled by your nausea medication, call the clinic.   BELOW ARE SYMPTOMS THAT SHOULD BE REPORTED IMMEDIATELY:  *FEVER GREATER THAN 100.5 F  *CHILLS WITH OR WITHOUT FEVER  NAUSEA AND VOMITING THAT IS NOT CONTROLLED WITH YOUR NAUSEA MEDICATION  *UNUSUAL SHORTNESS OF BREATH  *UNUSUAL BRUISING OR BLEEDING  TENDERNESS IN MOUTH AND THROAT WITH OR WITHOUT PRESENCE OF ULCERS  *URINARY PROBLEMS  *BOWEL PROBLEMS  UNUSUAL RASH Items with * indicate a potential emergency and should be followed up as soon as possible.  Feel free to call the clinic you have any questions or concerns. The clinic phone number is [phone].  Please show the CHEMO ALERT CARD at check-in to the Emergency Department and triage nurse.   

## 2015-07-30 NOTE — Progress Notes (Signed)
OFFICE PROGRESS NOTE   August 01, 2015   Physicians: Everitt Amber ,Biagio Borg, MD , Aloha Gell  INTERVAL HISTORY:  Patient is seen, alone for visit, in continuing attention to advanced left ovarian cancer for which she continues taxol avastin (taxol days 1,8,15 and avastin days 1 and 15 every 28 days). She is due day 1 cycle 6 today. Last imaging was 05-29-15, after cycle 3. We plan repeat scan after cycle 6.  Patient's granddaughter graduates in [facility] May 25. She requests a couple of weeks treatment break prior, such that cycle 6 will be thru day 8 on 08-06-15. She will have scans after returning home, then see me on ~ 09-03-15.   Patient feels generally well and continues to tolerate this treatment regimen without significant side effects, tho she notices some mood swings that are likely related to steroids with chemo. Environmental allergies have been better staying indoors. She denies abdominal or pelvic pain, increased SOB. No bleeding, LE swelling. Bowels are moving well daily. No increased peripheral neuropathy. No problems with PAC. No bleeding. Energy adequate for regular activities, cooked for granddaughter (at [facility]) this week and enjoyed sister's surprise birthday.  Remainder of 10 point Review of Systems negative/ unchanged.   PAC in Genetics testing sent 03-02-15, normal  (Breast Ovarian Panel by [facility]) CA 125 was 1406 in 10-2013 and was 692 as baseline for weekly taxol avastin on 03-12-15 Flu vaccine 01-05-15  ONCOLOGIC HISTORY Patient had acute abdominal pain Dec 2014, which resolved without medical attention, then vague diffuse abdominal discomfort, low grade nausea and abdominal bloating. She was seen in June 2015 for first gyn exam in years, PAP with AGUS and follow up biopsies of endocervix and endometrium by Dr Pamala Hurry both with serous apparent endometrial cancer (pathology from [facility] 09-12-13, case # [id number] to be scanned into this EMR). Biopsy of cervix had  normal squamous epithelium. CA125 from [facility] 09-16-13 was 964, and was up to 1406 by 10-23-13.Marland Kitchen She was seen by Dr Denman George on 09-23-13, her exam remarkable for large pelvic mass minimally mobile and extending to umbilicus. She had CT CAP 09-26-13 had prominent but not enlarged juxtapericardiac lymph nodes, trace right pleural effusion, large amount of abnormal soft tissue thruout peritoneal cavity, predominately with omentum and especially LUQ, largest area 3.8 x 3.0 x 4.0 cm anterior to proximal descending colon, implants adjacent to liver, large complex multicystic lesion in pelvis inseperable from uterus and adnexae, with mass effect on bladder, questionable area in central aspect of dome of liver, borderline enlarged retroperitoneal nodes. Due to extent of disease, treatment began with chemotherapy. Cycle one taxol carboplatin was given on 05-16-22, complicated by severe nausea/vomiting, constipation and severe taxol aches. Cycle 2 was changed to dose dense carbo taxol, day 1 on 11-01-13. She needed gCSF support by cycle 3. Neoadjuvant chemo was one cycle carbo taxol standard dose and 3 cycles dose dense. 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 She completed cycle 6 carbo taxol on 04-25-14. She had increase in CA 125 marker from 8 in 05-2014 to 26 in 08-2014, and repeat 63 on 09-08-14. CT AP 09-16-14 progressive peritoneal metastases, increasing retroperitoneal and transverse mesocolon nodes and soft tissue area at vaginal cuff stable to slightly decreased. She began doxil on 10-10-14, CA 125 151 that day as baseline for doxil. She had 4 cycles of doxil  thru 01-09-15, then CT 02-11-15 showed mixed response but with multiple new liver metastases. CA 125 on 02-02-15 was 299. She began weekly taxol with avastin 03-12-15. Restaging CT AP after 3 cycles 05-29-15 showed  improvement in omental and vaginal cuff involvement, single liver lesion slightly larger and right external iliac node increased, no other new disease, other liver areas stable to slightly smaller.   Objective:  Vital signs in last 24 hours:  BP 139/72 mmHg  Pulse 113  Temp(Src) 97.5 F (36.4 C) (Oral)  Resp 18  Wt 232 lb 12.8 oz (105.597 kg)  SpO2 98% Weight up 1 lb Alert, oriented and appropriate. Ambulatory without assistance.   HEENT:PERRL, sclerae not icteric. Oral mucosa moist without lesions, posterior pharynx clear.  Neck supple. No JVD.  Lymphatics:no supraclavicular adenopathy Resp: clear to auscultation bilaterally and normal percussion bilaterally Cardio: regular rate and rhythm. No gallop. GI: abdomen obese, soft, nontender, not distended, cannot appreciate mass or organomegaly. Normally active bowel sounds. Surgical incision not remarkable. Musculoskeletal/ Extremities: without pitting edema, cords, tenderness Neuro: no increased peripheral neuropathy. Otherwise nonfocal  PSYCH appropriate mood and affect Skin without rash, ecchymosis, petechiae Portacath-without erythema or tenderness  Lab Results:  Results for orders placed or performed in visit on 07/30/15  CBC with Differential  Result Value Ref Range   WBC 7.5 3.9 - 10.3 10e3/uL   NEUT# 6.2 1.5 - 6.5 10e3/uL   HGB 12.4 11.6 - 15.9 g/dL   HCT 37.8 34.8 - 46.6 %   Platelets 262 145 - 400 10e3/uL   MCV 82.5 79.5 - 101.0 fL   MCH 27.1 25.1 - 34.0 pg   MCHC 32.8 31.5 - 36.0 g/dL   RBC 4.58 3.70 - 5.45 10e6/uL   RDW 17.7 (H) 11.2 - 14.5 %   lymph# 1.1 0.9 - 3.3 10e3/uL   MONO# 0.1 0.1 - 0.9 10e3/uL   Eosinophils Absolute 0.0 0.0 - 0.5 10e3/uL   Basophils Absolute 0.0 0.0 - 0.1 10e3/uL   NEUT% 82.9 (H) 38.4 - 76.8 %   LYMPH% 15.1 14.0 - 49.7 %   MONO% 1.9 0.0 - 14.0 %   EOS% 0.0 0.0 - 7.0 %   BASO% 0.1 0.0 - 2.0 %   nRBC 0 0 - 0 %  Comprehensive metabolic panel  Result Value Ref Range   Sodium 139  136 - 145 mEq/L   Potassium 4.2 3.5 - 5.1 mEq/L   Chloride 104 98 - 109 mEq/L   CO2 24 22 - 29 mEq/L   Glucose 121 70 - 140 mg/dl   BUN 24.3 7.0 - 26.0 mg/dL   Creatinine 1.0 0.6 - 1.1 mg/dL   Total Bilirubin 0.33 0.20 - 1.20 mg/dL   Alkaline Phosphatase 76 40 - 150 U/L   AST 21 5 - 34 U/L   ALT 19 0 - 55 U/L   Total Protein 7.7 6.4 - 8.3 g/dL   Albumin 3.5 3.5 - 5.0 g/dL   Calcium 10.0 8.4 - 10.4 mg/dL   Anion Gap 11 3 - 11 mEq/L   EGFR 70 (L) >90 ml/min/1.73 m2  CA 125  Result Value Ref Range   Cancer Antigen (CA) 125 Quantity was not sufficient for analysis. U/mL  Urine protein by dipstick  Result Value Ref Range   Protein, ur Negative Negative- <30 mg/dL    Will add CA 125 to labs 08-06-15 with insufficient sample above.  Studies/Results:  No results found.   Pathology 2015 strongly ER + [phone])  Medications:  I have reviewed the patient's current medications.  DISCUSSION  Chemo schedule discussed, will hold day 15 cycle 6 to allow her to enjoy granddaughter's graduation.   If change in regimen needed after upcoming scan, consider letrozole, gemzar, oral VP16, Alimta. If stable PR could consider PARP inhibitor Zejula (niraparza)  Assessment/Plan:  1. 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 Recurrent disease found by increasing CA 125 initially 08-2014 . Mixed response to 4 cycles of doxil thru 01-09-15, but multiple new liver mets. Weekly taxol days 1,8,15 every 28 days with avastin days 1 and 15 has given some improvement by CT 05-29-15. Continue treatment thru 08-06-15 (day 8 cycle 6) then repeat scans later in May.  Genetics testing normal. Note surgical path strongly ER+. 2.elevated BP: likely in part with avastin, better now with zestoretic, which she has tolerated well in past.  3 PAC in 4.GI symptoms improved with regular laxatives and H2  blocker, and may be better also with some response to present chemo avastin regimen 5.Anemia improved 6.neuropathy symptoms from taxol feet>hands essentially resolved after initial taxol, following now back on that drug but no worse 7.does not have Advance Directives per EMR 8.flu vaccine 01-05-15 9.EF good by echocardiogram prior to #4 doxil 10.morbid obesity, BMI 40. Contributing to other problems   All questions answered and she knows to call if needed prior to next scheduled visit. Chemo orders confirmed. Time spent 25 min including >50% counseling and coordination of care. Route PCP    Gordy Levan, MD   08/01/2015, 6:07 PM

## 2015-08-04 ENCOUNTER — Other Ambulatory Visit: Payer: Self-pay | Admitting: Oncology

## 2015-08-05 ENCOUNTER — Telehealth: Payer: Self-pay

## 2015-08-05 NOTE — Telephone Encounter (Signed)
Pt LVM asking about appts tomorrow. I LVM clarifying appt times. Asked her to call back or speak to the nurse in the AM if she has other questions

## 2015-08-06 ENCOUNTER — Other Ambulatory Visit (HOSPITAL_BASED_OUTPATIENT_CLINIC_OR_DEPARTMENT_OTHER): Payer: Medicare Other

## 2015-08-06 ENCOUNTER — Other Ambulatory Visit: Payer: Self-pay

## 2015-08-06 ENCOUNTER — Ambulatory Visit (HOSPITAL_BASED_OUTPATIENT_CLINIC_OR_DEPARTMENT_OTHER): Payer: Medicare Other

## 2015-08-06 ENCOUNTER — Ambulatory Visit: Payer: Medicare Other

## 2015-08-06 VITALS — BP 118/87 | HR 101 | Temp 97.6°F | Resp 18

## 2015-08-06 DIAGNOSIS — C569 Malignant neoplasm of unspecified ovary: Secondary | ICD-10-CM | POA: Diagnosis not present

## 2015-08-06 DIAGNOSIS — C562 Malignant neoplasm of left ovary: Secondary | ICD-10-CM

## 2015-08-06 DIAGNOSIS — Z5111 Encounter for antineoplastic chemotherapy: Secondary | ICD-10-CM

## 2015-08-06 DIAGNOSIS — Z95828 Presence of other vascular implants and grafts: Secondary | ICD-10-CM

## 2015-08-06 LAB — COMPREHENSIVE METABOLIC PANEL
ALBUMIN: 3.5 g/dL (ref 3.5–5.0)
ALK PHOS: 81 U/L (ref 40–150)
ALT: 16 U/L (ref 0–55)
ANION GAP: 11 meq/L (ref 3–11)
AST: 19 U/L (ref 5–34)
BUN: 24.8 mg/dL (ref 7.0–26.0)
CALCIUM: 10.1 mg/dL (ref 8.4–10.4)
CO2: 24 meq/L (ref 22–29)
CREATININE: 0.9 mg/dL (ref 0.6–1.1)
Chloride: 104 mEq/L (ref 98–109)
EGFR: 73 mL/min/{1.73_m2} — ABNORMAL LOW (ref >90)
Glucose: 122 mg/dl (ref 70–140)
Potassium: 4.2 mEq/L (ref 3.5–5.1)
Sodium: 138 mEq/L (ref 136–145)
TOTAL PROTEIN: 7.5 g/dL (ref 6.4–8.3)

## 2015-08-06 LAB — CBC WITH DIFFERENTIAL/PLATELET
BASO%: 0.5 % (ref 0.0–2.0)
Basophils Absolute: 0.1 10*3/uL (ref 0.0–0.1)
EOS ABS: 0 10*3/uL (ref 0.0–0.5)
EOS%: 0 % (ref 0.0–7.0)
HEMATOCRIT: 37.8 % (ref 34.8–46.6)
HEMOGLOBIN: 12.1 g/dL (ref 11.6–15.9)
LYMPH#: 1 10*3/uL (ref 0.9–3.3)
LYMPH%: 10.1 % — ABNORMAL LOW (ref 14.0–49.7)
MCH: 27 pg (ref 25.1–34.0)
MCHC: 32.1 g/dL (ref 31.5–36.0)
MCV: 84.2 fL (ref 79.5–101.0)
MONO#: 0.1 10*3/uL (ref 0.1–0.9)
MONO%: 1.4 % (ref 0.0–14.0)
NEUT%: 88 % — ABNORMAL HIGH (ref 38.4–76.8)
NEUTROS ABS: 8.9 10*3/uL — AB (ref 1.5–6.5)
PLATELETS: 266 10*3/uL (ref 145–400)
RBC: 4.49 10*6/uL (ref 3.70–5.45)
RDW: 18.2 % — ABNORMAL HIGH (ref 11.2–14.5)
WBC: 10.2 10*3/uL (ref 3.9–10.3)

## 2015-08-06 MED ORDER — FAMOTIDINE IN NACL 20-0.9 MG/50ML-% IV SOLN
20.0000 mg | Freq: Once | INTRAVENOUS | Status: AC
  Administered 2015-08-06: 20 mg via INTRAVENOUS

## 2015-08-06 MED ORDER — DIPHENHYDRAMINE HCL 50 MG/ML IJ SOLN
50.0000 mg | Freq: Once | INTRAMUSCULAR | Status: AC
  Administered 2015-08-06: 50 mg via INTRAVENOUS

## 2015-08-06 MED ORDER — SODIUM CHLORIDE 0.9 % IV SOLN
Freq: Once | INTRAVENOUS | Status: AC
  Administered 2015-08-06: 11:00:00 via INTRAVENOUS

## 2015-08-06 MED ORDER — FAMOTIDINE IN NACL 20-0.9 MG/50ML-% IV SOLN
INTRAVENOUS | Status: AC
  Filled 2015-08-06: qty 50

## 2015-08-06 MED ORDER — DIPHENHYDRAMINE HCL 50 MG/ML IJ SOLN
INTRAMUSCULAR | Status: AC
  Filled 2015-08-06: qty 1

## 2015-08-06 MED ORDER — SODIUM CHLORIDE 0.9 % IV SOLN
Freq: Once | INTRAVENOUS | Status: AC
  Administered 2015-08-06: 11:00:00 via INTRAVENOUS
  Filled 2015-08-06: qty 8

## 2015-08-06 MED ORDER — SODIUM CHLORIDE 0.9 % IJ SOLN
10.0000 mL | INTRAMUSCULAR | Status: DC | PRN
  Administered 2015-08-06: 10 mL via INTRAVENOUS
  Filled 2015-08-06: qty 10

## 2015-08-06 MED ORDER — SODIUM CHLORIDE 0.9 % IV SOLN
80.0000 mg/m2 | Freq: Once | INTRAVENOUS | Status: AC
  Administered 2015-08-06: 174 mg via INTRAVENOUS
  Filled 2015-08-06: qty 29

## 2015-08-06 MED ORDER — HEPARIN SOD (PORK) LOCK FLUSH 100 UNIT/ML IV SOLN
500.0000 [IU] | Freq: Once | INTRAVENOUS | Status: AC | PRN
  Administered 2015-08-06: 500 [IU]
  Filled 2015-08-06: qty 5

## 2015-08-06 MED ORDER — SODIUM CHLORIDE 0.9 % IJ SOLN
10.0000 mL | INTRAMUSCULAR | Status: DC | PRN
  Administered 2015-08-06: 10 mL
  Filled 2015-08-06: qty 10

## 2015-08-06 NOTE — Patient Instructions (Signed)
[facility] Discharge Instructions for Patients Receiving Chemotherapy  Today you received the following chemotherapy agents Taxol   To help prevent nausea and vomiting after your treatment, we encourage you to take your nausea medication as directed.   If you develop nausea and vomiting that is not controlled by your nausea medication, call the clinic.   BELOW ARE SYMPTOMS THAT SHOULD BE REPORTED IMMEDIATELY:  *FEVER GREATER THAN 100.5 F  *CHILLS WITH OR WITHOUT FEVER  NAUSEA AND VOMITING THAT IS NOT CONTROLLED WITH YOUR NAUSEA MEDICATION  *UNUSUAL SHORTNESS OF BREATH  *UNUSUAL BRUISING OR BLEEDING  TENDERNESS IN MOUTH AND THROAT WITH OR WITHOUT PRESENCE OF ULCERS  *URINARY PROBLEMS  *BOWEL PROBLEMS  UNUSUAL RASH Items with * indicate a potential emergency and should be followed up as soon as possible.  Feel free to call the clinic you have any questions or concerns. The clinic phone number is [phone].  Please show the CHEMO ALERT CARD at check-in to the Emergency Department and triage nurse.   

## 2015-08-06 NOTE — Patient Instructions (Signed)

## 2015-08-07 LAB — CA 125

## 2015-08-09 ENCOUNTER — Other Ambulatory Visit: Payer: Self-pay | Admitting: Oncology

## 2015-08-09 DIAGNOSIS — C562 Malignant neoplasm of left ovary: Secondary | ICD-10-CM

## 2015-08-13 ENCOUNTER — Ambulatory Visit (HOSPITAL_BASED_OUTPATIENT_CLINIC_OR_DEPARTMENT_OTHER): Payer: Medicare Other | Admitting: Oncology

## 2015-08-13 ENCOUNTER — Encounter: Payer: Self-pay | Admitting: Oncology

## 2015-08-13 ENCOUNTER — Other Ambulatory Visit (HOSPITAL_BASED_OUTPATIENT_CLINIC_OR_DEPARTMENT_OTHER): Payer: Medicare Other

## 2015-08-13 ENCOUNTER — Ambulatory Visit: Payer: Medicare Other

## 2015-08-13 ENCOUNTER — Ambulatory Visit (HOSPITAL_BASED_OUTPATIENT_CLINIC_OR_DEPARTMENT_OTHER): Payer: Medicare Other

## 2015-08-13 VITALS — BP 114/84 | HR 105 | Temp 97.6°F | Resp 18 | Wt 230.1 lb

## 2015-08-13 DIAGNOSIS — C787 Secondary malignant neoplasm of liver and intrahepatic bile duct: Secondary | ICD-10-CM

## 2015-08-13 DIAGNOSIS — Z95828 Presence of other vascular implants and grafts: Secondary | ICD-10-CM

## 2015-08-13 DIAGNOSIS — C569 Malignant neoplasm of unspecified ovary: Secondary | ICD-10-CM

## 2015-08-13 DIAGNOSIS — C562 Malignant neoplasm of left ovary: Secondary | ICD-10-CM | POA: Diagnosis not present

## 2015-08-13 LAB — CBC WITH DIFFERENTIAL/PLATELET
BASO%: 0.5 % (ref 0.0–2.0)
Basophils Absolute: 0 10*3/uL (ref 0.0–0.1)
EOS%: 0.9 % (ref 0.0–7.0)
Eosinophils Absolute: 0 10*3/uL (ref 0.0–0.5)
HCT: 35.9 % (ref 34.8–46.6)
HGB: 11.5 g/dL — ABNORMAL LOW (ref 11.6–15.9)
LYMPH%: 28 % (ref 14.0–49.7)
MCH: 27.1 pg (ref 25.1–34.0)
MCHC: 32 g/dL (ref 31.5–36.0)
MCV: 84.5 fL (ref 79.5–101.0)
MONO#: 0.3 10*3/uL (ref 0.1–0.9)
MONO%: 6.3 % (ref 0.0–14.0)
NEUT#: 3.5 10*3/uL (ref 1.5–6.5)
NEUT%: 64.3 % (ref 38.4–76.8)
Platelets: 246 10*3/uL (ref 145–400)
RBC: 4.24 10*6/uL (ref 3.70–5.45)
RDW: 18.7 % — ABNORMAL HIGH (ref 11.2–14.5)
WBC: 5.4 10*3/uL (ref 3.9–10.3)
lymph#: 1.5 10*3/uL (ref 0.9–3.3)

## 2015-08-13 LAB — COMPREHENSIVE METABOLIC PANEL
ALK PHOS: 81 U/L (ref 40–150)
ALT: 17 U/L (ref 0–55)
AST: 19 U/L (ref 5–34)
Albumin: 3.5 g/dL (ref 3.5–5.0)
Anion Gap: 9 mEq/L (ref 3–11)
BILIRUBIN TOTAL: 0.32 mg/dL (ref 0.20–1.20)
BUN: 14.6 mg/dL (ref 7.0–26.0)
CO2: 25 meq/L (ref 22–29)
CREATININE: 0.9 mg/dL (ref 0.6–1.1)
Calcium: 9.6 mg/dL (ref 8.4–10.4)
Chloride: 105 mEq/L (ref 98–109)
EGFR: 74 mL/min/{1.73_m2} — ABNORMAL LOW (ref >90)
GLUCOSE: 97 mg/dL (ref 70–140)
Potassium: 3.8 mEq/L (ref 3.5–5.1)
SODIUM: 139 meq/L (ref 136–145)
TOTAL PROTEIN: 7 g/dL (ref 6.4–8.3)

## 2015-08-13 LAB — TECHNOLOGIST REVIEW

## 2015-08-13 MED ORDER — SODIUM CHLORIDE 0.9 % IJ SOLN
10.0000 mL | INTRAMUSCULAR | Status: DC | PRN
  Administered 2015-08-13: 10 mL via INTRAVENOUS
  Filled 2015-08-13: qty 10

## 2015-08-13 MED ORDER — HEPARIN SOD (PORK) LOCK FLUSH 100 UNIT/ML IV SOLN
500.0000 [IU] | Freq: Once | INTRAVENOUS | Status: AC | PRN
  Administered 2015-08-13: 500 [IU] via INTRAVENOUS
  Filled 2015-08-13: qty 5

## 2015-08-13 NOTE — Progress Notes (Signed)
OFFICE PROGRESS NOTE   Aug 13, 2015   Physicians: Everitt Amber ,Biagio Borg, MD , Aloha Gell  INTERVAL HISTORY:  Patient is seen, alone for visit, in continuing attention to advanced left ovarian cancer, most recently treated with taxol avastin thru day 8 cycle 6 on 08-06-15 (taxol days 04-11-13 and avastin days 1-15 every 28 days). Plan is to repeat CTs in late May.  Patient did fairly well with day 8 cycle 6 taxol on 08-06-15, no gCSF. Taxol aches have subsided, still chronic arthritis symptoms knees. She has had no significant nausea and no vomiting, is eating. Bowels are moving well daily. She has no persistent abdominal or pelvic  pain. Energy has been adequate to sing in choir. She had drainage from SQ cyst mid back after warm compresses there, no tenderness now. She has had no fever, no bleeding, less environmental allergy symptoms now on claritin daily and not sitting on porch. Eyelashes are growing back, now ~ 2 days of erythema, slight swelling and tenderness left upper lid. No significant peripheral neuropathy. No problems with PAC. Remainder of 10 point Review of Systems negative.    PAC in Genetics testing sent 03-02-15, normal by Breast Ovarian Panel by [facility] CA 125 was 1406 in 10-2013 and was 692 as baseline for weekly taxol avastin on 03-12-15 Flu vaccine 01-05-15  Granddaughter's high school graduation in [place] May 25.  ONCOLOGIC HISTORY Patient had acute abdominal pain Dec 2014, which resolved without medical attention, then vague diffuse abdominal discomfort, low grade nausea and abdominal bloating. She was seen in June 2015 for first gyn exam in years, PAP with AGUS and follow up biopsies of endocervix and endometrium by Dr Pamala Hurry both with serous apparent endometrial cancer (pathology from [facility] 09-12-13, case # [id number] to be scanned into this EMR). Biopsy of cervix had normal squamous epithelium. CA125 from [facility] 09-16-13 was 964, and was up to 1406  by 10-23-13.Marland Kitchen She was seen by Dr Denman George on 09-23-13, her exam remarkable for large pelvic mass minimally mobile and extending to umbilicus. She had CT CAP 09-26-13 had prominent but not enlarged juxtapericardiac lymph nodes, trace right pleural effusion, large amount of abnormal soft tissue thruout peritoneal cavity, predominately with omentum and especially LUQ, largest area 3.8 x 3.0 x 4.0 cm anterior to proximal descending colon, implants adjacent to liver, large complex multicystic lesion in pelvis inseperable from uterus and adnexae, with mass effect on bladder, questionable area in central aspect of dome of liver, borderline enlarged retroperitoneal nodes. Due to extent of disease, treatment began with chemotherapy. Cycle one taxol carboplatin was given on 2-58-52, complicated by severe nausea/vomiting, constipation and severe taxol aches. Cycle 2 was changed to dose dense carbo taxol, day 1 on 11-01-13. She needed gCSF support by cycle 3. Neoadjuvant chemo was one cycle carbo taxol standard dose and 3 cycles dose dense. 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 She resumed chemo with cycle 4 on 02-21-14 and completed cycle 6 on 04-25-14. She had unremarkable exam by Dr Denman George 08-11-14, however increase in CA 125 marker then, from 8 in 05-2014 to 26 in 08-2014, and repeat 63 on 09-08-14. CT AP 09-16-14 shows progressive peritoneal metastases, increasing retroperitoneal and transverse mesocolon nodes and soft tissue area at vaginal cuff stable to slightly decreased. She began doxil on 10-10-14, CA 125 151 that day as baseline for doxil. She had  4 cycles of doxil thru 01-09-15, then CT 02-11-15 showed mixed response but with multiple new liver metastases. CA 125 on 02-02-15 was 299. She began weekly taxol with avastin 03-12-15. Restaging CT AP after 3 cycles 05-29-15 showed improvement in omental and vaginal  cuff involvement, single liver lesion slightly larger and right external iliac node increased, no other new disease, other liver areas stable to slightly smaller.   Objective:  Vital signs in last 24 hours:  BP 114/84 mmHg  Pulse 105  Temp(Src) 97.6 F (36.4 C) (Oral)  Resp 18  Wt 230 lb 1.6 oz (104.373 kg)  SpO2 100% Weight down 2 lbs Alert, oriented and appropriate. Ambulatory without assistance.    HEENT:PERRL, sclerae not icteric. Right upper eyelid slightly puffy and slightly erythematous, sclerae not injected.  Oral mucosa moist without lesions, posterior pharynx clear.  Neck supple. No JVD.  Lymphatics:no cervical,supraclavicular, or appreciable inguinal adenopathy Resp: clear to auscultation bilaterally and normal percussion bilaterally Cardio: regular rate and rhythm. No gallop. GI: abdomen obese, soft, nontender, not distended, no appreciable mass or organomegaly. Normally active bowel sounds. Surgical incision not remarkable. Musculoskeletal/ Extremities: without pitting edema, cords, tenderness Neuro: no peripheral neuropathy. Otherwise nonfocal. PSYCH appropriate mood and affect Skin otherwise without rash, ecchymosis, petechiae Portacath-without erythema or tenderness  Lab Results:  Results for orders placed or performed in visit on 08/13/15  TECHNOLOGIST REVIEW  Result Value Ref Range   Technologist Review occ meta   CBC with Differential  Result Value Ref Range   WBC 5.4 3.9 - 10.3 10e3/uL   NEUT# 3.5 1.5 - 6.5 10e3/uL   HGB 11.5 (L) 11.6 - 15.9 g/dL   HCT 35.9 34.8 - 46.6 %   Platelets 246 145 - 400 10e3/uL   MCV 84.5 79.5 - 101.0 fL   MCH 27.1 25.1 - 34.0 pg   MCHC 32.0 31.5 - 36.0 g/dL   RBC 4.24 3.70 - 5.45 10e6/uL   RDW 18.7 (H) 11.2 - 14.5 %   lymph# 1.5 0.9 - 3.3 10e3/uL   MONO# 0.3 0.1 - 0.9 10e3/uL   Eosinophils Absolute 0.0 0.0 - 0.5 10e3/uL   Basophils Absolute 0.0 0.0 - 0.1 10e3/uL   NEUT% 64.3 38.4 - 76.8 %   LYMPH% 28.0 14.0 - 49.7 %    MONO% 6.3 0.0 - 14.0 %   EOS% 0.9 0.0 - 7.0 %   BASO% 0.5 0.0 - 2.0 %  Comprehensive metabolic panel  Result Value Ref Range   Sodium 139 136 - 145 mEq/L   Potassium 3.8 3.5 - 5.1 mEq/L   Chloride 105 98 - 109 mEq/L   CO2 25 22 - 29 mEq/L   Glucose 97 70 - 140 mg/dl   BUN 14.6 7.0 - 26.0 mg/dL   Creatinine 0.9 0.6 - 1.1 mg/dL   Total Bilirubin 0.32 0.20 - 1.20 mg/dL   Alkaline Phosphatase 81 40 - 150 U/L   AST 19 5 - 34 U/L   ALT 17 0 - 55 U/L   Total Protein 7.0 6.4 - 8.3 g/dL   Albumin 3.5 3.5 - 5.0 g/dL   Calcium 9.6 8.4 - 10.4 mg/dL   Anion Gap 9 3 - 11 mEq/L   EGFR 74 (L) >90 ml/min/1.73 m2   CA 125 170 on 07-30-15, 165 on 07-09-15 and 143 on 06-18-15.  Studies/Results:  No results found.  Medications: I have reviewed the patient's current medications.  DISCUSSION Counts good for upcoming trip to [place], for several days. She will  get oral contrast for upcoming CT. She understands that she may need to continue this or other treatment depending on results of CT.  NOTE path diffusely, strongly ER PR + If treatment change needed, could consider letrozole, gemzar, oral etoposide, Alimta, possibly PARP inhibitor niraparza (Zejula) if stable PR.  Assessment/Plan:  1. 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 Recurrent disease found by increasing CA 125 initially 08-2014 . Mixed response to 4 cycles of doxil thru 01-09-15, but multiple new liver mets. Weekly taxol days 1,8,15 every 28 days with avastin days 1 and 15, some improvement by CT 05-29-15. Day 15 cycle 6 taxol and avastin given 08-06-15, now holding treatment for family event then repeat scans in ~ 3 weeks. CA 125 has not improved tho clinically she is stable and has tolerated this treatment very adequately.   Genetics testing normal 2.HTN: likely in part with avastin, better now with zestoretic, which  she was on in past.  3. PAC in 4. intermittent low back discomfort not new, better when not so sedentary. RIght hip pain also likely degenerative and weight related, not complaining of this now 5.Anemia multifactorial, seems minimally symptomatic, follow. 6.neuropathy symptoms from taxol feet>hands essentially resolved after initial taxol, following now back on that drug but no worse 7.does not have Advance Directives per EMR 8.flu vaccine 01-05-15 9.EF good by echocardiogram prior to #4 doxil 10.morbid obesity, BMI 40.   All questions answered. Time spent 25 min including >50% counseling and coordination of care.    Gordy Levan, MD   08/13/2015, 5:37 PM

## 2015-08-13 NOTE — Patient Instructions (Signed)

## 2015-08-18 ENCOUNTER — Telehealth: Payer: Self-pay

## 2015-08-18 DIAGNOSIS — H00013 Hordeolum externum right eye, unspecified eyelid: Secondary | ICD-10-CM

## 2015-08-18 MED ORDER — BACITRACIN 500 UNIT/GM OP OINT: 1.0000 "application " | TOPICAL_OINTMENT | Freq: Three times a day (TID) | OPHTHALMIC | Status: DC

## 2015-08-18 NOTE — Telephone Encounter (Signed)
-----   Message from Gordy Levan, MD sent at 08/14/2015 11:57 AM EDT ----- Please let me know if patient calls re right eyelid no better  thanks

## 2015-08-18 NOTE — Telephone Encounter (Signed)
Called Ms Marze to see how her right eyelid was with putting warm soaks as recommended by Dr. Marko Plume at 08-13-15 visit. Ms. Rakowski states that the eyelid remains red and sore to touch.   Afebrile.  No pustule or any head noted from compresses. Will review with Dr. Marko Plume and let [name] know Dr. Mariana Kaufman recommendations. Pt goes out of town the beginning of next week for a graduation.

## 2015-08-18 NOTE — Telephone Encounter (Signed)
Spoke with Ms. Melissa Ball and told her that Dr. Marko Plume sent antibiotic eye ointment to pharmacy as noted below. Pt does not have an ophthalmologist.  Suggested if the eye does not improve with treatment noted by Dr. Marko Plume, she shoud contact one to evaluate eye. [name] verbalized understanding.

## 2015-08-18 NOTE — Telephone Encounter (Signed)
Generic bacitracin ophthalmic ointment tid x 5 days.  If she has opthalmologist she could call them also if needed.  Continue warm compresses at least 4x daily  thanks

## 2015-08-20 ENCOUNTER — Telehealth: Payer: Self-pay

## 2015-08-20 NOTE — Telephone Encounter (Signed)
Left a message for Melissa Ball to call back regarding her right eyelid.  Told her to leave a detailed message if nurse is not available.

## 2015-08-21 NOTE — Telephone Encounter (Signed)
LM for Melissa Ball to call back if she hand any concerns or questions regarding her right eyelid and she left a message to call yesterday afternoon to call her and she has not returned this nurse's call.

## 2015-08-31 ENCOUNTER — Other Ambulatory Visit: Payer: Self-pay | Admitting: Oncology

## 2015-08-31 DIAGNOSIS — C562 Malignant neoplasm of left ovary: Secondary | ICD-10-CM

## 2015-09-02 ENCOUNTER — Encounter (HOSPITAL_COMMUNITY): Payer: Self-pay

## 2015-09-02 ENCOUNTER — Ambulatory Visit (HOSPITAL_COMMUNITY)
Admission: RE | Admit: 2015-09-02 | Discharge: 2015-09-02 | Disposition: A | Discharge status: Home / Self Care | Payer: Medicare Other | Source: Ambulatory Visit | Attending: Oncology | Admitting: Oncology

## 2015-09-02 DIAGNOSIS — C562 Malignant neoplasm of left ovary: Secondary | ICD-10-CM | POA: Diagnosis not present

## 2015-09-02 DIAGNOSIS — C786 Secondary malignant neoplasm of retroperitoneum and peritoneum: Secondary | ICD-10-CM | POA: Insufficient documentation

## 2015-09-02 DIAGNOSIS — C787 Secondary malignant neoplasm of liver and intrahepatic bile duct: Secondary | ICD-10-CM | POA: Diagnosis not present

## 2015-09-02 DIAGNOSIS — R918 Other nonspecific abnormal finding of lung field: Secondary | ICD-10-CM | POA: Diagnosis not present

## 2015-09-02 MED ORDER — IOPAMIDOL (ISOVUE-300) INJECTION 61%
100.0000 mL | Freq: Once | INTRAVENOUS | Status: AC | PRN
  Administered 2015-09-02: 100 mL via INTRAVENOUS

## 2015-09-03 ENCOUNTER — Telehealth: Payer: Self-pay | Admitting: Oncology

## 2015-09-03 ENCOUNTER — Encounter: Payer: Self-pay | Admitting: Oncology

## 2015-09-03 ENCOUNTER — Other Ambulatory Visit: Payer: Medicare Other

## 2015-09-03 ENCOUNTER — Ambulatory Visit (HOSPITAL_BASED_OUTPATIENT_CLINIC_OR_DEPARTMENT_OTHER): Payer: Medicare Other | Admitting: Oncology

## 2015-09-03 ENCOUNTER — Telehealth: Payer: Self-pay

## 2015-09-03 VITALS — BP 110/81 | HR 101 | Temp 97.5°F | Resp 18 | Ht 64.0 in | Wt 233.5 lb

## 2015-09-03 DIAGNOSIS — C569 Malignant neoplasm of unspecified ovary: Secondary | ICD-10-CM

## 2015-09-03 DIAGNOSIS — C562 Malignant neoplasm of left ovary: Secondary | ICD-10-CM | POA: Diagnosis not present

## 2015-09-03 DIAGNOSIS — C787 Secondary malignant neoplasm of liver and intrahepatic bile duct: Secondary | ICD-10-CM

## 2015-09-03 DIAGNOSIS — Z95828 Presence of other vascular implants and grafts: Secondary | ICD-10-CM

## 2015-09-03 MED ORDER — LETROZOLE 2.5 MG PO TABS: 2.5000 mg | ORAL_TABLET | Freq: Every day | ORAL | Status: DC

## 2015-09-03 NOTE — Telephone Encounter (Signed)
appt made and avs printed °

## 2015-09-03 NOTE — Telephone Encounter (Signed)
Melissa Ball called and stated that her son Melissa Ball would like Dr. Marko Plume to call him to discuss his mother's condition.  He lives in [place]. His phone number os [phone].

## 2015-09-03 NOTE — Telephone Encounter (Signed)
Medical Oncology  At request of patient, spoke with son by phone now to review information from scan and visit earlier today. Discussed options of hormone blocker to give her a break from chemo for now, possibly other chemo later if she wants or needs that. He is in agreement with plan to use hormone blocker now. Son understands that this is not situation of potential cure. Told him that we discussed Advance Directives, which he has already tried to discuss with her. Told him that patient does not want resuscitation or life support in irreversible situation, but reinforced that we can still do interventions short of that which may be beneficial. He asks if DNR can be rescinded if reversible need, which certainly it can be. Discussed ok to lose weight as long as this is because of healthier diet and perhaps better exercise.   Son appreciated call.  Godfrey Pick, MD

## 2015-09-03 NOTE — Progress Notes (Signed)
OFFICE PROGRESS NOTE   September 03, 2015   Physicians:Emma Nada Maclachlan, MD , Aloha Gell  INTERVAL HISTORY:  Patient is seen, together with niece, in continuing attention to recurrent left ovarian carcinoma, having had restaging CT AP on 09-02-15. The CT has somewhat mixed response to most recent taxol avastin, but overall progression. Following patient's visit, I have also spoken with her son by phone.   Patient received weekly taxol with avastin from 03-11-16 thru 08-06-15, thru day 8 cycle 6. In general she tolerated the chemotherapy and avastin without significant problems, tho she would obviously prefer not to have any side effects. She has felt better overall off of treatment the last few weeks, enjoyed her granddaugher's high school graduation in [place] last week. Appetite is good without nausea or vomiting, bowels are moving regularly, no persistant abdominal or pelvis discomfort, no bleeding, no increased SOB. She noticed puffiness of right ankle this week, none in calf and no tenderness, did break car trip to walk at intervals back from [place]. No problems with PAC. The stye right upper eyelid is improved but not resolved, less swollen and less uncomfortable with warm soaks and topical antibiotic.  Remainder of 10 point Review of Systems negative/ unchanged.     PAC in Genetics testing normal 03-02-15 ( Breast Ovarian Panel by [facility]) CA 125 was 1406 in 10-2013.   ONCOLOGIC HISTORY Patient had acute abdominal pain Dec 2014, which resolved without medical attention, then vague diffuse abdominal discomfort, low grade nausea and abdominal bloating. She was seen in June 2015 for first gyn exam in years, PAP with AGUS and follow up biopsies of endocervix and endometrium by Dr Pamala Hurry both with serous apparent endometrial cancer (pathology from [facility] 09-12-13, case # [id number] to be scanned into this EMR). Biopsy of cervix had normal squamous epithelium. CA125 from [facility] 09-16-13 was 964, and was up to 1406 by 10-23-13.Marland Kitchen She was seen by Dr Denman George on 09-23-13, her exam remarkable for large pelvic mass minimally mobile and extending to umbilicus. She had CT CAP 09-26-13 had prominent but not enlarged juxtapericardiac lymph nodes, trace right pleural effusion, large amount of abnormal soft tissue thruout peritoneal cavity, predominately with omentum and especially LUQ, largest area 3.8 x 3.0 x 4.0 cm anterior to proximal descending colon, implants adjacent to liver, large complex multicystic lesion in pelvis inseperable from uterus and adnexae, with mass effect on bladder, questionable area in central aspect of dome of liver, borderline enlarged retroperitoneal nodes. Due to extent of disease, treatment began with chemotherapy. Cycle one taxol carboplatin was given on A999333, complicated by severe nausea/vomiting, constipation and severe taxol aches. Cycle 2 was changed to dose dense carbo taxol, day 1 on 11-01-13. She needed gCSF support by cycle 3. Neoadjuvant chemo was one cycle carbo taxol standard dose and 3 cycles dose dense. 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 She resumed chemo with cycle 4 on 02-21-14 and completed cycle 6 on 04-25-14. She had unremarkable exam by Dr Denman George 08-11-14, however increase in CA 125 marker then, from 8 in 05-2014 to 26 in 08-2014, and repeat 63 on 09-08-14. CT AP 09-16-14 shows progressive peritoneal metastases, increasing retroperitoneal and transverse mesocolon nodes and soft tissue area at vaginal cuff stable to slightly decreased. She began doxil on 10-10-14, CA 125 151 that day as baseline for doxil. She had 4 cycles of  doxil thru 01-09-15, then CT 02-11-15 showed mixed response but with multiple new liver metastases. CA 125 on 02-02-15 was 299. She began weekly taxol with avastin 03-12-15. Restaging CT AP after 3 cycles 05-29-15  showed improvement in omental and vaginal cuff involvement, single liver lesion slightly larger and right external iliac node increased, no other new disease, other liver areas stable to slightly smaller. Taxol avastin continued thru day 8 cycle 6 on 08-06-15, with repeat CT AP 09-02-15 showing increase in hepatic lesionsand scattered peritoneal involvement.    Objective:  Vital signs in last 24 hours:  BP 110/81 mmHg  Pulse 101  Temp(Src) 97.5 F (36.4 C) (Oral)  Resp 18  Ht 5\' 4"  (1.626 m)  Wt 233 lb 8 oz (105.915 kg)  BMI 40.06 kg/m2  SpO2 100% Weight up 3 lbs Alert, oriented and appropriate. Ambulatory without assistance. Respirations not labored RA Partial alopecia  HEENT:PERRL, sclerae not icteric. Stye still present right upper lid but smaller, no surrounding swelling or erythema, not tender. Oral mucosa moist without lesions, posterior pharynx clear.  Neck supple. No JVD.  Lymphatics:no cervical,supraclavicular adenopathy Resp: clear to auscultation bilaterally and normal percussion bilaterally Cardio: regular rate and rhythm. No gallop. GI: abdomen obese, soft, nontender, no mass or organomegaly. Normally active bowel sounds. Surgical incision not remarkable. Musculoskeletal/ Extremities: without pitting edema, cords, tenderness including right ankle Neuro: no change minimal peripheral neuropathy. Otherwise nonfocal. PSYCH appropriate mood and affect Skin without rash, ecchymosis, petechiae Portacath-without erythema or tenderness  Lab Results: Patient did not have labs drawn today as planned, last CBC CMET on 5-11 and last CA 125 on 07-30-15   170  Studies/Results:  Ct Abdomen Pelvis W Contrast  09/02/2015  CLINICAL DATA:  Left ovarian cancer, hysterectomy and bilateral salpingo-oophorectomy. EXAM: CT ABDOMEN AND PELVIS WITH CONTRAST TECHNIQUE: Multidetector CT imaging of the abdomen and pelvis was performed using the standard protocol following bolus administration of  intravenous contrast. CONTRAST:  139mL ISOVUE-300 IOPAMIDOL (ISOVUE-300) INJECTION 61% COMPARISON:  07/23/2015, 02/11/2015, 09/26/2013 and CT chest 01/22/2014. FINDINGS: Lower chest: 6 mm subpleural left lower lobe nodule (series 4, image 30), appears slightly more prominent than on 02/11/2015, at which time it measured 3 mm. Heart size normal. No pericardial or pleural effusion. Moderate hiatal hernia. Hepatobiliary: Mildly heterogeneous low-attenuation lesions in the liver have generally enlarged, measuring up to 3.2 cm in the left hepatic lobe, previously 1.5 cm. Previously measured lesion in the caudate is grossly stable, measuring 1.9 x 2.3 cm. Gallbladder is unremarkable. No biliary ductal dilatation. Pancreas: Negative. Spleen: Negative. Adrenals/Urinary Tract: Low-attenuation adrenal lesions measure 2.5 cm on the right and 1.7 cm on the left, stable from baseline examination of 09/26/2013. Kidneys are unremarkable. Ureters are decompressed. Bladder is low in volume. Stomach/Bowel: Moderate hiatal hernia. Stomach, small bowel and colon are unremarkable. Appendix is not well-visualized. Vascular/Lymphatic: Scattered atherosclerotic calcification of the arterial vasculature without abdominal aortic aneurysm. Abdominal retroperitoneal lymph nodes measure up to 10 mm in the left periaortic station (series 2, image 42), stable. Pelvic retroperitoneal lymph nodes are seen along the left external iliac chain, measuring up to 1.6 cm in short axis (series 2, image 68), stable. Reproductive: Hysterectomy and bilateral salpingo-oophorectomy. Soft tissue thickening along the vaginal cuff (series 2, image 66), stable. Other: Peritoneal nodules in the lateral left mid abdomen measure up to 1.3 x 5.1 cm (series 2, image 24), stable. Peritoneal lesions in the anterior left lower quadrant measure up to 3.7 x 4.3 cm (series 2, image 37),  previously 3.1 x 3.9 cm. There are 2 peritoneal nodules along the lower descending colon.  A 12 mm nodule (Series 2, image 49) has increased in size from 7 mm. A 13 mm nodule along the anterior margin (series 2, image 55) may have enlarged slightly from 11 mm. Scattered nodularity in the small bowel mesenteries measures up to 8 mm (series 2, image 43), stable. No free fluid. Musculoskeletal: Circumscribed sclerotic lesion in the right iliac wing is unchanged. Degenerative changes are seen in the spine. IMPRESSION: 1. Progressive hepatic metastatic disease. 2. Peritoneal carcinomatosis appears minimally progressive. 3. 6 mm subpleural left lower lobe nodule, slightly more prominent than on prior exams. Continued attention on followup exams is warranted. 4. Bilateral adrenal nodules are unchanged from 09/26/2013, indicative of adenomas. Electronically Signed   By: Lorin Picket M.D.   On: 09/02/2015 10:55   PACs images reveiwed with patient and niece at visit.  Medications: I have reviewed the patient's current medications. She will call if BP systolic < 123456 x 3 days, or if symptomatic with fewer readings, as she may be able to decrease/ DC antihypertensive as she gets further out from avastin  Begin letrozole 2.5 mg daily.  DISCUSSION Information from CT AP 09-02-15 discussed. Patient understands that she is not getting enough benefit from the taxol and avastin to continue one or both of those. Fortunately she is mostly asymptomatic from the progressive disease at this point; we have discussed need to keep quality of life considerations as a priority as this is not a situation of potential cure regardless of how aggressive treatment is now. Additional chemo may or may not help given that disease has been relatively refractory thus far, tho also not rapidly progressive; she has not had gemzar, oral etoposide or Alimta. Note initial surgical path was strongly and diffusely ER PR +. We have discussed fact that oral hormone blockers are generally better tolerated than chemotherapy, tho they also can  have some side effects and generally do not show results as quickly as effective chemo. I have explained mechanism of action of estrogen blockers, and reviewed possible side effects of aromatase inhibitor letrozole, including exacerbation of hot flashes, increased tendency for blood clots, arthralgias.  I do not know that she has had bone density scan, which may be appropriate if she has good response to the AI such that she remains on that class of drug for an extended time. I did not specifically discuss this with patient now.  At conclusion of this discussion,, patient would like to try oral hormone blocker as next intervention.    Advance Directives also discussed with patient and niece together, and reviewed with son by phone after visit. She has not completed these, was given information packet now. I have explained that resuscitation and life support would not improve underlying advanced malignancy, and told her that interventions short of resuscitation and life support can still be considered if patient has requested DNR status. Patient tells me that she would not want life support in irreversible situation.  Per my phone conversation with son, he has tried to discuss these issues with her previously and will follow up. They know that [facility] social worker can assist in completing forms if desired, tho actual forms are not imperative as long as treating physicians and family know her wishes.   Assessment/Plan:  1. 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 Recurrent disease found by increasing CA 125 initially 08-2014 . Mixed response to 4 cycles of doxil thru 01-09-15, but multiple new liver mets. Weekly taxol avastin initially with response by marker and stable CT, however recently CA 125 has not improved and CT 09-02-15 overall progression.  Will begin letrozole; I will update Dr  Denman George. I will see her back in ~ 4 -6 weeks Genetics testing normal 2.HTN: likely in part with avastin, better now with zestoretic, which she was on in past. She will let Dr Jenny Reichmann or myself know if BP trends lower since now off avastin 3. PAC in 4. intermittent low back discomfort not new, better when not so sedentary. RIght hip pain also likely degenerative and weight related, not complaining of this now 5.Anemia multifactorial: improved with last CBC, will follow with next labs 6.neuropathy symptoms from taxol feet>hands no worse 7.Patient does not want resuscitation or life support in irreversible situation 8.EF good by echocardiogram prior to #4 doxil 9..morbid obesity, BMI 40. 10.stye right upper eyelid: continue warm soaks and topical antibiotic. Could have ophth evaluate if prefers  All questions answered and she knows to call if concerns prior to next scheduled visit. Time spent 35 min including >50% counseling and coordination of care. Route PCP, cc Dr Bartholomew Boards, MD   09/03/2015, 6:00 PM

## 2015-09-24 ENCOUNTER — Other Ambulatory Visit: Payer: Self-pay | Admitting: Nurse Practitioner

## 2015-10-01 ENCOUNTER — Other Ambulatory Visit: Payer: Self-pay

## 2015-10-01 DIAGNOSIS — C569 Malignant neoplasm of unspecified ovary: Secondary | ICD-10-CM

## 2015-10-01 MED ORDER — LISINOPRIL-HYDROCHLOROTHIAZIDE 20-25 MG PO TABS: 1.0000 | ORAL_TABLET | Freq: Every day | ORAL | Status: DC

## 2015-10-05 ENCOUNTER — Other Ambulatory Visit: Payer: Self-pay

## 2015-10-05 DIAGNOSIS — C569 Malignant neoplasm of unspecified ovary: Secondary | ICD-10-CM

## 2015-10-05 MED ORDER — LETROZOLE 2.5 MG PO TABS: 2.5000 mg | ORAL_TABLET | Freq: Every day | ORAL | Status: DC

## 2015-10-14 ENCOUNTER — Other Ambulatory Visit: Payer: Self-pay | Admitting: Oncology

## 2015-10-15 ENCOUNTER — Ambulatory Visit: Payer: Medicare Other

## 2015-10-15 ENCOUNTER — Ambulatory Visit (HOSPITAL_BASED_OUTPATIENT_CLINIC_OR_DEPARTMENT_OTHER): Payer: Medicare Other | Admitting: Oncology

## 2015-10-15 ENCOUNTER — Other Ambulatory Visit (HOSPITAL_BASED_OUTPATIENT_CLINIC_OR_DEPARTMENT_OTHER): Payer: Medicare Other

## 2015-10-15 ENCOUNTER — Telehealth: Payer: Self-pay | Admitting: Oncology

## 2015-10-15 ENCOUNTER — Encounter: Payer: Self-pay | Admitting: Oncology

## 2015-10-15 VITALS — BP 122/81 | HR 95 | Temp 97.4°F | Resp 18 | Ht 64.0 in | Wt 228.2 lb

## 2015-10-15 DIAGNOSIS — C562 Malignant neoplasm of left ovary: Secondary | ICD-10-CM

## 2015-10-15 DIAGNOSIS — I158 Other secondary hypertension: Secondary | ICD-10-CM

## 2015-10-15 DIAGNOSIS — Z95828 Presence of other vascular implants and grafts: Secondary | ICD-10-CM

## 2015-10-15 DIAGNOSIS — C569 Malignant neoplasm of unspecified ovary: Secondary | ICD-10-CM

## 2015-10-15 DIAGNOSIS — C787 Secondary malignant neoplasm of liver and intrahepatic bile duct: Secondary | ICD-10-CM

## 2015-10-15 LAB — CBC WITH DIFFERENTIAL/PLATELET
BASO%: 0.4 % (ref 0.0–2.0)
Basophils Absolute: 0 10*3/uL (ref 0.0–0.1)
EOS%: 0.4 % (ref 0.0–7.0)
Eosinophils Absolute: 0 10*3/uL (ref 0.0–0.5)
HEMATOCRIT: 33.9 % — AB (ref 34.8–46.6)
HEMOGLOBIN: 10.7 g/dL — AB (ref 11.6–15.9)
LYMPH#: 1.7 10*3/uL (ref 0.9–3.3)
LYMPH%: 19.6 % (ref 14.0–49.7)
MCH: 25.9 pg (ref 25.1–34.0)
MCHC: 31.7 g/dL (ref 31.5–36.0)
MCV: 81.7 fL (ref 79.5–101.0)
MONO#: 0.8 10*3/uL (ref 0.1–0.9)
MONO%: 8.9 % (ref 0.0–14.0)
NEUT#: 6.3 10*3/uL (ref 1.5–6.5)
NEUT%: 70.7 % (ref 38.4–76.8)
Platelets: 232 10*3/uL (ref 145–400)
RBC: 4.15 10*6/uL (ref 3.70–5.45)
RDW: 16 % — AB (ref 11.2–14.5)
WBC: 8.9 10*3/uL (ref 3.9–10.3)

## 2015-10-15 LAB — COMPREHENSIVE METABOLIC PANEL
ALBUMIN: 3.3 g/dL — AB (ref 3.5–5.0)
ALT: 10 U/L (ref 0–55)
AST: 20 U/L (ref 5–34)
Alkaline Phosphatase: 85 U/L (ref 40–150)
Anion Gap: 12 mEq/L — ABNORMAL HIGH (ref 3–11)
BUN: 15.4 mg/dL (ref 7.0–26.0)
CALCIUM: 10.1 mg/dL (ref 8.4–10.4)
CHLORIDE: 103 meq/L (ref 98–109)
CO2: 24 mEq/L (ref 22–29)
CREATININE: 1 mg/dL (ref 0.6–1.1)
EGFR: 71 mL/min/{1.73_m2} — ABNORMAL LOW (ref >90)
GLUCOSE: 88 mg/dL (ref 70–140)
Potassium: 4 mEq/L (ref 3.5–5.1)
SODIUM: 139 meq/L (ref 136–145)
Total Bilirubin: 0.36 mg/dL (ref 0.20–1.20)
Total Protein: 7.9 g/dL (ref 6.4–8.3)

## 2015-10-15 MED ORDER — SODIUM CHLORIDE 0.9 % IJ SOLN
10.0000 mL | INTRAMUSCULAR | Status: DC | PRN
  Administered 2015-10-15: 10 mL via INTRAVENOUS
  Filled 2015-10-15: qty 10

## 2015-10-15 MED ORDER — HEPARIN SOD (PORK) LOCK FLUSH 100 UNIT/ML IV SOLN
500.0000 [IU] | Freq: Once | INTRAVENOUS | Status: AC | PRN
  Administered 2015-10-15: 500 [IU] via INTRAVENOUS
  Filled 2015-10-15: qty 5

## 2015-10-15 NOTE — Patient Instructions (Signed)

## 2015-10-15 NOTE — Progress Notes (Signed)
OFFICE PROGRESS NOTE   October 15, 2015   Physicians: Everitt Amber ,Biagio Borg, MD , Aloha Gell  INTERVAL HISTORY:  Patient is seen, together with niece, in continuing attention to recurrent left ovarian carcinoma, on letrozole since 09-03-15.   Patient has felt generally well since she was here last, tolerating the letrozole well other than some increased hot flashes which are manageable. She has felt dizzy or a little lightheaded at times, has not checked blood pressure at home and may need adjustment in antihypertensive meds (lisinopril HCTZ) since BP may improve now off avastin. Energy otherwise is better, no complaints of abdominal pain now, no nausea or vomiting, bowels ok. No bleeding. No problems with PAC. No SOB, no LE swelling. No other neurologic symptoms. Remainder of 10 point Review of Systems negative/ unchanged  PAC in Genetics testing sent 03-02-15, normal by Breast Ovarian Panel by [facility] CA 125 was 1406 in 10-2013 and was 692 as baseline for weekly taxol avastin on 03-12-15  Patient had large 4th of July picnic at her house, which she enjoyed very much.  ONCOLOGIC HISTORY Patient had acute abdominal pain Dec 2014, which resolved without medical attention, then vague diffuse abdominal discomfort, low grade nausea and abdominal bloating. She was seen in June 2015 for first gyn exam in years, PAP with AGUS and follow up biopsies of endocervix and endometrium by Dr Pamala Hurry both with serous apparent endometrial cancer (pathology from [facility] 09-12-13, case # [id number] to be scanned into this EMR). Biopsy of cervix had normal squamous epithelium. CA125 from [facility] 09-16-13 was 964, and was up to 1406 by 10-23-13.Marland Kitchen She was seen by Dr Denman George on 09-23-13, her exam remarkable for large pelvic mass minimally mobile and extending to umbilicus. She had CT CAP 09-26-13 had prominent but not enlarged juxtapericardiac lymph nodes, trace right pleural effusion, large amount of  abnormal soft tissue thruout peritoneal cavity, predominately with omentum and especially LUQ, largest area 3.8 x 3.0 x 4.0 cm anterior to proximal descending colon, implants adjacent to liver, large complex multicystic lesion in pelvis inseperable from uterus and adnexae, with mass effect on bladder, questionable area in central aspect of dome of liver, borderline enlarged retroperitoneal nodes. Due to extent of disease, treatment began with chemotherapy. Cycle one taxol carboplatin was given on A999333, complicated by severe nausea/vomiting, constipation and severe taxol aches. Cycle 2 was changed to dose dense carbo taxol, day 1 on 11-01-13. She needed gCSF support by cycle 3. Neoadjuvant chemo was one cycle carbo taxol standard dose and 3 cycles dose dense. 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 She resumed chemo with cycle 4 on 02-21-14 and completed cycle 6 on 04-25-14. She had unremarkable exam by Dr Denman George 08-11-14, however increase in CA 125 marker then, from 8 in 05-2014 to 26 in 08-2014, and repeat 63 on 09-08-14. CT AP 09-16-14 shows progressive peritoneal metastases, increasing retroperitoneal and transverse mesocolon nodes and soft tissue area at vaginal cuff stable to slightly decreased. She began doxil on 10-10-14, CA 125 151 that day as baseline for doxil. She had 4 cycles of doxil thru 01-09-15, then CT 02-11-15 showed mixed response but with multiple new liver metastases. CA 125 on 02-02-15 was 299. She began weekly taxol with avastin 03-12-15. Restaging CT AP after 3 cycles 05-29-15 showed improvement in omental and vaginal cuff involvement, single liver lesion slightly larger and  right external iliac node increased, no other new disease, other liver areas stable to slightly smaller.  Taxol avastin continued thru day 8 cycle 6 on 08-06-15, with repeat CT AP 09-02-15 showing increase in  hepatic lesionsand scattered peritoneal involvement. She began letrozole on 09-03-15.   Objective:  Vital signs in last 24 hours:  BP 122/81 mmHg  Pulse 95  Temp(Src) 97.4 F (36.3 C) (Oral)  Resp 18  Ht 5\' 4"  (1.626 m)  Wt 228 lb 3.2 oz (103.511 kg)  BMI 39.15 kg/m2  SpO2 100% Weight down 5 lbs. Alert, oriented and appropriate. Ambulatory without difficulty.   HEENT:PERRL, sclerae not icteric. Oral mucosa moist without lesions, posterior pharynx clear.  Neck supple. No JVD.  Lymph nodes: no supraclavicular adenopathy Resp: clear to auscultation bilaterally  Cardio: regular rate and rhythm. No gallop. GI: abdomen obese, soft, nontender, not distended, cannot appreciate mass or organomegaly. Normally active bowel sounds. Surgical incision not remarkable. Musculoskeletal/ Extremities: without pitting edema, cords, tenderness Neuro: speech fluent, no significant peripheral neuropathy. Otherwise nonfocal.  PSYCH appropriate mood and affect  Skin without rash, ecchymosis, petechiae Portacath-without erythema or tenderness  Lab Results:  Results for orders placed or performed in visit on 10/15/15  CBC with Differential  Result Value Ref Range   WBC 8.9 3.9 - 10.3 10e3/uL   NEUT# 6.3 1.5 - 6.5 10e3/uL   HGB 10.7 (L) 11.6 - 15.9 g/dL   HCT 33.9 (L) 34.8 - 46.6 %   Platelets 232 145 - 400 10e3/uL   MCV 81.7 79.5 - 101.0 fL   MCH 25.9 25.1 - 34.0 pg   MCHC 31.7 31.5 - 36.0 g/dL   RBC 4.15 3.70 - 5.45 10e6/uL   RDW 16.0 (H) 11.2 - 14.5 %   lymph# 1.7 0.9 - 3.3 10e3/uL   MONO# 0.8 0.1 - 0.9 10e3/uL   Eosinophils Absolute 0.0 0.0 - 0.5 10e3/uL   Basophils Absolute 0.0 0.0 - 0.1 10e3/uL   NEUT% 70.7 38.4 - 76.8 %   LYMPH% 19.6 14.0 - 49.7 %   MONO% 8.9 0.0 - 14.0 %   EOS% 0.4 0.0 - 7.0 %   BASO% 0.4 0.0 - 2.0 %   CMET today normal with exception of albumin 3.3  CA 125 results not consistent, lab notified.   Studies/Results:  No results found.  Medications: I have  reviewed the patient's current medications.  DISCUSSION Clinically looks good, glad to have a break from chemo,  in agreement with continuing letrozole for now. I will see her coordinating with PAC flush.  She will check blood pressures at home, let PCP or this office know if running < 120/80 as she may be able to come off of lisinopril again as she gets further out from avastin         Assessment/Plan:  1. 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 Recurrent disease found by increasing CA 125 initially 08-2014 . Mixed response to 4 cycles of doxil thru 01-09-15, but multiple new liver mets. Weekly taxol avastin initially with response by marker and stable CT, however recently CA 125 has not improved and CT 09-02-15 overall progression. Letrozole begun 09-03-15, tolerating well.  I will see her back in ~ 6 weeks Genetics testing normal 2.HTN: likely in part with avastin, better with lisinopril HCTZ resumed then. She will let Dr Jenny Reichmann or myself know if BP trends lower since  now off avastin 3. PAC in, flush every 6-8 weeks when not otherwise used 4. 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         All questions answered and they know to call if needed prior to next scheduled appointment. Time spent 25 min including >50% counseling and coordination of care.   Evlyn Clines, MD   10/15/2015, 8:59 AM

## 2015-10-15 NOTE — Telephone Encounter (Signed)
appt made and avs printed °

## 2015-10-16 LAB — CA 125

## 2015-10-22 ENCOUNTER — Telehealth: Payer: Self-pay

## 2015-10-22 NOTE — Telephone Encounter (Signed)
Pt called b/c of pain under her R breast when taking a deep breath. She had a stomach flu 2-3 days ago after being around sick child. She had diarrhea. Now she has a pain under her R breast when she takes a deep breath. She has been using Gas-X and coca cola. She is feeling a little better. Denies SOB/sputum, denies fever, had BM yesterday. She was wanting to make sure she is doing the right things. Instructed her to call tomorrow if she does not continue to improve or gets worse.

## 2015-10-26 ENCOUNTER — Telehealth: Payer: Self-pay | Admitting: *Deleted

## 2015-10-26 ENCOUNTER — Other Ambulatory Visit: Payer: Self-pay | Admitting: Oncology

## 2015-10-26 ENCOUNTER — Telehealth: Payer: Self-pay | Admitting: Oncology

## 2015-10-26 DIAGNOSIS — C569 Malignant neoplasm of unspecified ovary: Secondary | ICD-10-CM

## 2015-10-26 NOTE — Telephone Encounter (Signed)
"  I missed a call from Dr. Mariana Kaufman nurse about scheduling an appointment this week."

## 2015-10-26 NOTE — Telephone Encounter (Signed)
Telephone   10/26/2015 [facility]  Lennis Marion Downer, MD  Oncology   [facility]  [facility])  Gordy Levan, MD  to Paulla Dolly. Joya Gaskins       10/26/15 9:22 AM  see documentation  Gordy Levan, MD      10/26/15 9:23 AM  Note    Medical Oncology  [facility] lab manager reviewed CA 125 results from 10-15-15 and suggests redraw.   MD LM for patient on home phone, told her "labs need repeat" hopefully this week, and that MD or RN can speak with her.  Needs apt for lab from [facility].   Godfrey Pick, MD     Additional Documentation   Encounter Info:   Billing Info,   History,   Allergies,   Detailed Report     Linked Episodes    ONCOLOGY TREATMENT Noted 10/05/2013    Orders Placed    None  Medication Renewals and Changes     None    Medication List  Visit Diagnoses     None    Problem List

## 2015-10-26 NOTE — Telephone Encounter (Signed)
Medical Oncology  [facility] lab manager reviewed CA 125 results from 10-15-15 and suggests redraw.   MD LM for patient on home phone, told her "labs need repeat" hopefully this week, and that MD or RN can speak with her.  Needs apt for lab from [facility].   Godfrey Pick, MD

## 2015-10-26 NOTE — Telephone Encounter (Signed)
Told Melissa Ball the rationale noted below by Dr Marko Plume for the lab appointment.  She is scheduled for Wednesday 10-28-15 at 1200.

## 2015-10-28 ENCOUNTER — Other Ambulatory Visit (HOSPITAL_BASED_OUTPATIENT_CLINIC_OR_DEPARTMENT_OTHER): Payer: Medicare Other

## 2015-10-28 ENCOUNTER — Other Ambulatory Visit: Payer: Self-pay | Admitting: Internal Medicine

## 2015-10-28 ENCOUNTER — Ambulatory Visit (HOSPITAL_BASED_OUTPATIENT_CLINIC_OR_DEPARTMENT_OTHER): Payer: Medicare Other

## 2015-10-28 DIAGNOSIS — C569 Malignant neoplasm of unspecified ovary: Secondary | ICD-10-CM | POA: Diagnosis not present

## 2015-10-28 DIAGNOSIS — C562 Malignant neoplasm of left ovary: Secondary | ICD-10-CM

## 2015-10-28 DIAGNOSIS — Z95828 Presence of other vascular implants and grafts: Secondary | ICD-10-CM

## 2015-10-28 LAB — COMPREHENSIVE METABOLIC PANEL
ALBUMIN: 3.3 g/dL — AB (ref 3.5–5.0)
ALK PHOS: 93 U/L (ref 40–150)
ALT: 12 U/L (ref 0–55)
AST: 22 U/L (ref 5–34)
Anion Gap: 12 mEq/L — ABNORMAL HIGH (ref 3–11)
BILIRUBIN TOTAL: 0.42 mg/dL (ref 0.20–1.20)
BUN: 28.9 mg/dL — AB (ref 7.0–26.0)
CO2: 22 meq/L (ref 22–29)
Calcium: 9.9 mg/dL (ref 8.4–10.4)
Chloride: 102 mEq/L (ref 98–109)
Creatinine: 1.6 mg/dL — ABNORMAL HIGH (ref 0.6–1.1)
EGFR: 38 mL/min/{1.73_m2} — ABNORMAL LOW (ref >90)
GLUCOSE: 84 mg/dL (ref 70–140)
Potassium: 3.5 mEq/L (ref 3.5–5.1)
SODIUM: 137 meq/L (ref 136–145)
TOTAL PROTEIN: 7.8 g/dL (ref 6.4–8.3)

## 2015-10-28 LAB — CBC WITH DIFFERENTIAL/PLATELET
BASO%: 0.2 % (ref 0.0–2.0)
Basophils Absolute: 0 10*3/uL (ref 0.0–0.1)
EOS ABS: 0.1 10*3/uL (ref 0.0–0.5)
EOS%: 0.6 % (ref 0.0–7.0)
HCT: 30.6 % — ABNORMAL LOW (ref 34.8–46.6)
HEMOGLOBIN: 10 g/dL — AB (ref 11.6–15.9)
LYMPH%: 20 % (ref 14.0–49.7)
MCH: 26.1 pg (ref 25.1–34.0)
MCHC: 32.7 g/dL (ref 31.5–36.0)
MCV: 79.9 fL (ref 79.5–101.0)
MONO#: 0.8 10*3/uL (ref 0.1–0.9)
MONO%: 8 % (ref 0.0–14.0)
NEUT%: 71.2 % (ref 38.4–76.8)
NEUTROS ABS: 6.9 10*3/uL — AB (ref 1.5–6.5)
Platelets: 258 10*3/uL (ref 145–400)
RBC: 3.83 10*6/uL (ref 3.70–5.45)
RDW: 15.6 % — ABNORMAL HIGH (ref 11.2–14.5)
WBC: 9.8 10*3/uL (ref 3.9–10.3)
lymph#: 2 10*3/uL (ref 0.9–3.3)

## 2015-10-28 MED ORDER — SODIUM CHLORIDE 0.9 % IJ SOLN
10.0000 mL | INTRAMUSCULAR | Status: DC | PRN
  Administered 2015-10-28: 10 mL via INTRAVENOUS
  Filled 2015-10-28: qty 10

## 2015-10-28 MED ORDER — HEPARIN SOD (PORK) LOCK FLUSH 100 UNIT/ML IV SOLN
500.0000 [IU] | Freq: Once | INTRAVENOUS | Status: AC | PRN
  Administered 2015-10-28: 500 [IU] via INTRAVENOUS
  Filled 2015-10-28: qty 5

## 2015-10-29 ENCOUNTER — Other Ambulatory Visit: Payer: Self-pay | Admitting: Oncology

## 2015-10-29 ENCOUNTER — Telehealth: Payer: Self-pay | Admitting: Oncology

## 2015-10-29 DIAGNOSIS — C569 Malignant neoplasm of unspecified ovary: Secondary | ICD-10-CM

## 2015-10-29 LAB — CA 125

## 2015-10-29 NOTE — Telephone Encounter (Signed)
MEDICAL ONCOLOGY  Spoke with patient re repeat CA 125 marker still elevated.  She has been on letrozole since 09-03-15, overall feeling much better than during chemo tho "gas pains under ribs" for 2 days last week after eating baked beans, resolved with gasX / laxative/ more water and feels fine now. I told her to avoid very high fiber foods and foods that cause excessive gas.  I would like to repeat CT AP ~ 11-25-15 and see her back ~ 11-26-15, which will be 12 weeks on the letrozole. Will draw labs from [facility] prior to CT, which will then be left accessed for the scan.  She is to call prior if more abdominal symptoms that are persistent.  She would be willing to try additional chemotherapy if appropriate. Patient thanked me for calling and will wait to hear from schedulers.   Godfrey Pick, MD

## 2015-11-23 ENCOUNTER — Ambulatory Visit (HOSPITAL_BASED_OUTPATIENT_CLINIC_OR_DEPARTMENT_OTHER): Payer: Medicare Other

## 2015-11-23 ENCOUNTER — Encounter: Payer: Self-pay | Admitting: Oncology

## 2015-11-23 ENCOUNTER — Telehealth: Payer: Self-pay

## 2015-11-23 ENCOUNTER — Other Ambulatory Visit: Payer: Self-pay | Admitting: Oncology

## 2015-11-23 ENCOUNTER — Ambulatory Visit (HOSPITAL_COMMUNITY)
Admission: RE | Admit: 2015-11-23 | Discharge: 2015-11-23 | Disposition: A | Discharge status: Home / Self Care | Payer: Medicare Other | Source: Ambulatory Visit | Attending: Oncology | Admitting: Oncology

## 2015-11-23 ENCOUNTER — Ambulatory Visit (HOSPITAL_BASED_OUTPATIENT_CLINIC_OR_DEPARTMENT_OTHER): Payer: Medicare Other | Admitting: Oncology

## 2015-11-23 VITALS — BP 114/85 | HR 131 | Temp 98.4°F | Resp 17 | Ht 64.0 in | Wt 219.5 lb

## 2015-11-23 VITALS — BP 132/74 | HR 116 | Resp 16

## 2015-11-23 DIAGNOSIS — C569 Malignant neoplasm of unspecified ovary: Secondary | ICD-10-CM

## 2015-11-23 DIAGNOSIS — D649 Anemia, unspecified: Secondary | ICD-10-CM

## 2015-11-23 DIAGNOSIS — I952 Hypotension due to drugs: Secondary | ICD-10-CM

## 2015-11-23 DIAGNOSIS — C787 Secondary malignant neoplasm of liver and intrahepatic bile duct: Secondary | ICD-10-CM | POA: Insufficient documentation

## 2015-11-23 DIAGNOSIS — G620 Drug-induced polyneuropathy: Secondary | ICD-10-CM

## 2015-11-23 DIAGNOSIS — C562 Malignant neoplasm of left ovary: Secondary | ICD-10-CM

## 2015-11-23 DIAGNOSIS — E860 Dehydration: Secondary | ICD-10-CM | POA: Diagnosis not present

## 2015-11-23 DIAGNOSIS — R918 Other nonspecific abnormal finding of lung field: Secondary | ICD-10-CM | POA: Diagnosis not present

## 2015-11-23 DIAGNOSIS — J9811 Atelectasis: Secondary | ICD-10-CM | POA: Diagnosis not present

## 2015-11-23 DIAGNOSIS — Z95828 Presence of other vascular implants and grafts: Secondary | ICD-10-CM

## 2015-11-23 DIAGNOSIS — O002 Ovarian pregnancy without intrauterine pregnancy: Secondary | ICD-10-CM

## 2015-11-23 LAB — CBC WITH DIFFERENTIAL/PLATELET
BASO%: 0.2 % (ref 0.0–2.0)
BASOS ABS: 0 10*3/uL (ref 0.0–0.1)
EOS%: 0 % (ref 0.0–7.0)
Eosinophils Absolute: 0 10*3/uL (ref 0.0–0.5)
HCT: 27.1 % — ABNORMAL LOW (ref 34.8–46.6)
HGB: 8.7 g/dL — ABNORMAL LOW (ref 11.6–15.9)
LYMPH%: 10.6 % — ABNORMAL LOW (ref 14.0–49.7)
MCH: 24.8 pg — AB (ref 25.1–34.0)
MCHC: 32.1 g/dL (ref 31.5–36.0)
MCV: 77.2 fL — AB (ref 79.5–101.0)
MONO#: 1.7 10*3/uL — ABNORMAL HIGH (ref 0.1–0.9)
MONO%: 11.3 % (ref 0.0–14.0)
NEUT#: 11.5 10*3/uL — ABNORMAL HIGH (ref 1.5–6.5)
NEUT%: 77.9 % — AB (ref 38.4–76.8)
Platelets: 293 10*3/uL (ref 145–400)
RBC: 3.51 10*6/uL — ABNORMAL LOW (ref 3.70–5.45)
RDW: 15.8 % — ABNORMAL HIGH (ref 11.2–14.5)
WBC: 14.8 10*3/uL — ABNORMAL HIGH (ref 3.9–10.3)
lymph#: 1.6 10*3/uL (ref 0.9–3.3)

## 2015-11-23 LAB — COMPREHENSIVE METABOLIC PANEL
ALT: 16 U/L (ref 0–55)
AST: 33 U/L (ref 5–34)
Albumin: 2.6 g/dL — ABNORMAL LOW (ref 3.5–5.0)
Alkaline Phosphatase: 113 U/L (ref 40–150)
Anion Gap: 11 mEq/L (ref 3–11)
BUN: 13.1 mg/dL (ref 7.0–26.0)
CALCIUM: 10 mg/dL (ref 8.4–10.4)
CHLORIDE: 103 meq/L (ref 98–109)
CO2: 24 mEq/L (ref 22–29)
Creatinine: 1.1 mg/dL (ref 0.6–1.1)
EGFR: 62 mL/min/{1.73_m2} — AB (ref >90)
Glucose: 105 mg/dl (ref 70–140)
POTASSIUM: 4.1 meq/L (ref 3.5–5.1)
SODIUM: 138 meq/L (ref 136–145)
Total Bilirubin: 0.52 mg/dL (ref 0.20–1.20)
Total Protein: 8.5 g/dL — ABNORMAL HIGH (ref 6.4–8.3)

## 2015-11-23 MED ORDER — SODIUM CHLORIDE 0.9 % IV SOLN
INTRAVENOUS | Status: DC
  Administered 2015-11-23: 14:00:00 via INTRAVENOUS

## 2015-11-23 MED ORDER — HEPARIN SOD (PORK) LOCK FLUSH 100 UNIT/ML IV SOLN
500.0000 [IU] | Freq: Once | INTRAVENOUS | Status: AC
  Administered 2015-11-23: 500 [IU] via INTRAVENOUS
  Filled 2015-11-23: qty 5

## 2015-11-23 MED ORDER — SODIUM CHLORIDE 0.9% FLUSH
10.0000 mL | INTRAVENOUS | Status: DC | PRN
  Administered 2015-11-23: 10 mL via INTRAVENOUS
  Filled 2015-11-23: qty 10

## 2015-11-23 NOTE — Patient Instructions (Signed)

## 2015-11-23 NOTE — Telephone Encounter (Signed)
Pt called several times this morning. She wants to be seen today if possible. She has been vomiting several times since Sunday. And hurting. S/w Dr Marko Plume and pt can be seen at [place].

## 2015-11-23 NOTE — Progress Notes (Signed)
OFFICE PROGRESS NOTE   November 23, 2015   Physicians: Everitt Amber ,Biagio Borg, MD , Aloha Gell  INTERVAL HISTORY:   Patient is seen, together with niece, earlier than planned visit due to vomiting and pain. 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   Patient and niece report more problems for ~ 2 weeks, particularly in past week. She has nausea with intermittent vomiting, has been able to drink green tea, not much po otherwise. She complains of pain lateral upper abdomen/ low chest on both sides, worse with deep breathing, and of pain across upper back and shoulders at times. Bowels have been somewhat loose, moved only a small amount on 11-22-15. She mentions hot flashes and chills, which are not shaking, no fever, no symptoms of infection. She is generally weak, did walk into office, not SOB at rest, no cough. She has not checked BP at home, has continued to take lisinopril HCTZ, which she needed when BP was higher during previous avastin. Feels "woozy" when stands or walks as she has experienced in past.  No problems with PAC. Is able to void. Did not sleep well last pm, generally uncomfortable. No bleeding, no black stools or vomitus. No problems with PAC.No swelling LE. Remainder of 10 point Review of Systems negative.    PAC in Genetics testing sent 03-02-15, normal by Breast Ovarian Panel by [facility] CA 125 was 1406 in 10-2013     ONCOLOGIC HISTORY Patient had acute abdominal pain Dec 2014, which resolved without medical attention, then vague diffuse abdominal discomfort, low grade nausea and abdominal bloating. She was seen in June 2015 for first gyn exam in years, PAP with AGUS and follow up biopsies of endocervix and endometrium by Dr Pamala Hurry both with serous apparent endometrial cancer (pathology from [facility] 09-12-13, case # [id number] to be scanned into this EMR). Biopsy of cervix had normal squamous epithelium.  CA125 from [facility] 09-16-13 was 964, and was up to 1406 by 10-23-13.Marland Kitchen She was seen by Dr Denman George on 09-23-13, her exam remarkable for large pelvic mass minimally mobile and extending to umbilicus. She had CT CAP 09-26-13 had prominent but not enlarged juxtapericardiac lymph nodes, trace right pleural effusion, large amount of abnormal soft tissue thruout peritoneal cavity, predominately with omentum and especially LUQ, largest area 3.8 x 3.0 x 4.0 cm anterior to proximal descending colon, implants adjacent to liver, large complex multicystic lesion in pelvis inseperable from uterus and adnexae, with mass effect on bladder, questionable area in central aspect of dome of liver, borderline enlarged retroperitoneal nodes. Due to extent of disease, treatment began with chemotherapy. Cycle one taxol carboplatin was given on 07-20-38, complicated by severe nausea/vomiting, constipation and severe taxol aches. Cycle 2 was changed to dose dense carbo taxol, day 1 on 11-01-13. She needed gCSF support by cycle 3. Neoadjuvant chemo was one cycle carbo taxol standard dose and 3 cycles dose dense. 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 She resumed chemo with cycle 4 on 02-21-14 and completed cycle 6 on 04-25-14. She had unremarkable exam by Dr Denman George 08-11-14, however increase in CA 125 marker then, from 8 in 05-2014 to 26 in 08-2014, and repeat 63 on 09-08-14. CT AP 09-16-14 shows progressive peritoneal metastases, increasing retroperitoneal and transverse mesocolon nodes and soft tissue area at vaginal cuff stable to slightly decreased. She  began doxil on 10-10-14, CA 125 151 that day as baseline for doxil. She had 4 cycles of doxil thru 01-09-15, then CT 02-11-15 showed mixed response but with multiple new liver metastases. CA 125 on 02-02-15 was 299. She began weekly taxol with avastin 03-12-15. Restaging CT AP  after 3 cycles 05-29-15 showed improvement in omental and vaginal cuff involvement, single liver lesion slightly larger and right external iliac node increased, no other new disease, other liver areas stable to slightly smaller.  Taxol avastin continued thru day 8 cycle 6 on 08-06-15, with repeat CT AP 09-02-15 showing increase in hepatic lesionsand scattered peritoneal involvement. She began letrozole on 09-03-15.  Objective:  Vital signs in last 24 hours:  BP 114/85 (BP Location: Left Arm, Patient Position: Sitting)   Pulse (!) 131 Comment: Alert the nurse about elevated heart rate  Temp 98.4 F (36.9 C) (Oral)   Resp 17   Ht 5' 4"  (1.626 m)   Wt 219 lb 8 oz (99.6 kg)   SpO2 99%   BMI 37.68 kg/m   BP and HR noted.   Weight down 8.5 lbs from 10-15-15. Looks uncomfortable but not acutely ill. Respirations not labored seated in exam room. No chills.  Alert, oriented and appropriate, very pleasant as always. Ambulatory into office.    HEENT:PERRL, sclerae not icteric. Oral mucosa somewhat dry without lesions, posterior pharynx clear.  Neck supple. No JVD.  Lymphatics:no cervical,supraclavicular adenopathy Resp: clear to auscultation bilaterally  Cardio: tachy, regular rate and rhythm. No gallop. GI: abdomen obese, soft, nontender, not distended, cannot appreciate mass or organomegaly. Some active bowel sounds. Surgical incision not remarkable. Musculoskeletal/ Extremities: without pitting edema, cords, tenderness Neuro: speech fluent, moves easily, no focal deficits. PSYCH appropriate mood and affect Skin without rash, ecchymosis, petechiae Portacath-without erythema or tenderness   Patient seen again by MD in infusion after ~ 500 cc NS, alert and feeling better. Repeat vitals at completion of 1 liter NS  Sitting BP 132/74 and HR 116, standing 143/91  Lab Results:  Results for orders placed or performed in visit on 11/23/15  CBC with Differential  Result Value Ref Range   WBC 14.8 (H)  3.9 - 10.3 10e3/uL   NEUT# 11.5 (H) 1.5 - 6.5 10e3/uL   HGB 8.7 (L) 11.6 - 15.9 g/dL   HCT 27.1 (L) 34.8 - 46.6 %   Platelets 293 145 - 400 10e3/uL   MCV 77.2 (L) 79.5 - 101.0 fL   MCH 24.8 (L) 25.1 - 34.0 pg   MCHC 32.1 31.5 - 36.0 g/dL   RBC 3.51 (L) 3.70 - 5.45 10e6/uL   RDW 15.8 (H) 11.2 - 14.5 %   lymph# 1.6 0.9 - 3.3 10e3/uL   MONO# 1.7 (H) 0.1 - 0.9 10e3/uL   Eosinophils Absolute 0.0 0.0 - 0.5 10e3/uL   Basophils Absolute 0.0 0.0 - 0.1 10e3/uL   NEUT% 77.9 (H) 38.4 - 76.8 %   LYMPH% 10.6 (L) 14.0 - 49.7 %   MONO% 11.3 0.0 - 14.0 %   EOS% 0.0 0.0 - 7.0 %   BASO% 0.2 0.0 - 2.0 %  Comprehensive metabolic panel  Result Value Ref Range   Sodium 138 136 - 145 mEq/L   Potassium 4.1 3.5 - 5.1 mEq/L   Chloride 103 98 - 109 mEq/L   CO2 24 22 - 29 mEq/L   Glucose 105 70 - 140 mg/dl   BUN 13.1 7.0 - 26.0 mg/dL   Creatinine 1.1 0.6 - 1.1 mg/dL  Total Bilirubin 0.52 0.20 - 1.20 mg/dL   Alkaline Phosphatase 113 40 - 150 U/L   AST 33 5 - 34 U/L   ALT 16 0 - 55 U/L   Total Protein 8.5 (H) 6.4 - 8.3 g/dL   Albumin 2.6 (L) 3.5 - 5.0 g/dL   Calcium 10.0 8.4 - 10.4 mg/dL   Anion Gap 11 3 - 11 mEq/L   EGFR 62 (L) >90 ml/min/1.73 m2   CA 125 available after visit up to 2189, having been 1300 in July  Studies/Results: Patient sent for xrays:  Dg Chest 2 View  Result Date: 11/23/2015 CLINICAL DATA:  Metastatic ovarian cancer. Lower abdominal pain and vomiting for 2 days. EXAM: CHEST  2 VIEW COMPARISON:  01/17/2014 FINDINGS: right Port-A-Cath remains in place with tip in the SVC. Bibasilar linear densities, which could reflect scarring or atelectasis. Heart is normal size. No effusions. No acute bony abnormality. IMPRESSION: Linear densities in the lung bases, scarring versus atelectasis. Electronically Signed   By: Rolm Baptise M.D.   On: 11/23/2015 13:34   Dg Abd 2 Views  Result Date: 11/23/2015 CLINICAL DATA:  Metastatic ovarian cancer. Lower abdominal pain and vomiting for 2 days.  EXAM: ABDOMEN - 2 VIEW COMPARISON:  CT 09/02/2015 FINDINGS: Nonobstructive bowel gas pattern. No free air organomegaly. No suspicious calcification. Degenerative changes in the lumbar spine and hips. Linear densities in the lung bases, likely atelectasis. IMPRESSION: No evidence of bowel obstruction or free air. Bibasilar atelectasis. Electronically Signed   By: Rolm Baptise M.D.   On: 11/23/2015 13:32    Medications: I have reviewed the patient's current medications. Stop lisinopril/ HCTZ.   DISCUSSION Acute problems likely from excessive antihypertensive now out from avastin. Stop Lisinopril HCTZ.   IV NS 1 liter with zofran given now, symptoms improved and BP/ HR also some better with that. Push po fluids.  Concern also that the malignancy likely is progressing. Will get CT AP hopefully this week. Could try gemzar, doubt could tolerate oral etoposide, possibly alimta; she would be hospice appropriate when systemic treatment discontinued.   Drop in hemoglobin noted, no clear source. She will keep MD appointment on 8-24 with repeat CBC and type/ hold then.  Per our previous discussion, she does not want resuscitation or life support in irreversible situation, tho I do not believe she has completed advance directives.      Assessment/Plan:  1. 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 Recurrent disease found by increasing CA 125 initially 08-2014 . Mixed response to 4 cycles of doxil thru 01-09-15, but multiple new liver mets. Weekly taxol avastin initially with response by marker and stable CT, however recently CA 125 has not improved and CT 09-02-15 overall progression. Letrozole begun 09-03-15, chosen as she needed break from chemo.  Marker increasing, more symptomatic likely from disease progression. CT AP ordered.  Genetics testing normal 2.HTN previously with avastin,  requiring lisinopril HCTZ then. Likely BP has improved out from avastin, also lower with dehydration now. Stop antihypertensive.  3. PAC in, flushed 11-23-15 4. intermittent low back discomfort and rIght hip pain ,  degenerative and weight related, not complaining of this now 5.Anemia multifactorial:  Hemoglobin lower now even with dehydration. Will recheck and may need PRBCs later this week 6.neuropathy symptoms from taxol feet>hands no worse 7.Patient does not want resuscitation or life support in irreversible situation 8.EF good by  echocardiogram prior to #4 doxil 9..morbid obesity   All questions answered and they know to call if concerns prior to follow up visit 11-26-15. Time spent 30 min including >50% counseling and coordination of care. Route Dr Jenny Reichmann   Evlyn Clines, MD   11/23/2015, 9:56 PM

## 2015-11-24 ENCOUNTER — Telehealth: Payer: Self-pay | Admitting: *Deleted

## 2015-11-24 ENCOUNTER — Telehealth: Payer: Self-pay

## 2015-11-24 ENCOUNTER — Encounter: Payer: Self-pay | Admitting: Oncology

## 2015-11-24 LAB — CA 125

## 2015-11-24 NOTE — Telephone Encounter (Signed)
She needs CBC on 8-24 to follow up the lower hemoglobin  thanks

## 2015-11-24 NOTE — Telephone Encounter (Addendum)
Spoke with Melissa Ball  and told her that Dr. Marko Plume wants to check her blood counts on Thursday since her hemaglobin was low on Monday 11-23-15.  Melissa Ball verbalized understanding and wrote lab appointment down for 0945 on 11-26-15.  Melissa Ball agreeable to have lab drawn from arm.

## 2015-11-24 NOTE — Telephone Encounter (Signed)
Pt called to say she is feeling much better. "I feel perky". She still has some pain in her side and is still gassy feeling using gas-x and burping. She is wanting to cancel labs on 8/23. She will keep CT and MD appts on 8/24.

## 2015-11-24 NOTE — Telephone Encounter (Signed)
FYI Melissa Ball with [facility] called asking what test to schedule patient for.  CT ABD/Pelvis with contrast or CT ABD/Pelvis W & WO contrast.   Advised to schedule the new order entered yesterday for the with and without.

## 2015-11-25 ENCOUNTER — Other Ambulatory Visit: Payer: Self-pay | Admitting: Oncology

## 2015-11-25 ENCOUNTER — Encounter (HOSPITAL_COMMUNITY): Payer: Medicare Other

## 2015-11-25 ENCOUNTER — Ambulatory Visit (HOSPITAL_COMMUNITY)
Admission: RE | Admit: 2015-11-25 | Discharge: 2015-11-25 | Disposition: A | Discharge status: Home / Self Care | Payer: Medicare Other | Source: Ambulatory Visit | Attending: Oncology | Admitting: Oncology

## 2015-11-25 ENCOUNTER — Other Ambulatory Visit: Payer: Medicare Other

## 2015-11-25 DIAGNOSIS — C562 Malignant neoplasm of left ovary: Secondary | ICD-10-CM | POA: Insufficient documentation

## 2015-11-26 ENCOUNTER — Telehealth: Payer: Self-pay | Admitting: *Deleted

## 2015-11-26 ENCOUNTER — Ambulatory Visit (HOSPITAL_BASED_OUTPATIENT_CLINIC_OR_DEPARTMENT_OTHER): Payer: Medicare Other

## 2015-11-26 ENCOUNTER — Ambulatory Visit (HOSPITAL_COMMUNITY)
Admission: RE | Admit: 2015-11-26 | Discharge: 2015-11-26 | Disposition: A | Discharge status: Home / Self Care | Payer: Medicare Other | Source: Ambulatory Visit | Attending: Oncology | Admitting: Oncology

## 2015-11-26 ENCOUNTER — Ambulatory Visit (HOSPITAL_COMMUNITY): Payer: Medicare Other

## 2015-11-26 ENCOUNTER — Encounter (HOSPITAL_COMMUNITY): Payer: Self-pay

## 2015-11-26 ENCOUNTER — Other Ambulatory Visit (HOSPITAL_BASED_OUTPATIENT_CLINIC_OR_DEPARTMENT_OTHER): Payer: Medicare Other

## 2015-11-26 ENCOUNTER — Other Ambulatory Visit: Payer: Self-pay | Admitting: Oncology

## 2015-11-26 ENCOUNTER — Ambulatory Visit (HOSPITAL_BASED_OUTPATIENT_CLINIC_OR_DEPARTMENT_OTHER): Payer: Medicare Other | Admitting: Oncology

## 2015-11-26 ENCOUNTER — Encounter: Payer: Self-pay | Admitting: Oncology

## 2015-11-26 ENCOUNTER — Telehealth: Payer: Self-pay | Admitting: Oncology

## 2015-11-26 VITALS — BP 113/88 | HR 132 | Temp 98.7°F | Resp 18 | Ht 64.0 in | Wt 221.8 lb

## 2015-11-26 DIAGNOSIS — D500 Iron deficiency anemia secondary to blood loss (chronic): Secondary | ICD-10-CM

## 2015-11-26 DIAGNOSIS — C569 Malignant neoplasm of unspecified ovary: Secondary | ICD-10-CM

## 2015-11-26 DIAGNOSIS — C562 Malignant neoplasm of left ovary: Secondary | ICD-10-CM

## 2015-11-26 DIAGNOSIS — Z95828 Presence of other vascular implants and grafts: Secondary | ICD-10-CM

## 2015-11-26 DIAGNOSIS — G620 Drug-induced polyneuropathy: Secondary | ICD-10-CM | POA: Diagnosis not present

## 2015-11-26 DIAGNOSIS — R918 Other nonspecific abnormal finding of lung field: Secondary | ICD-10-CM | POA: Diagnosis not present

## 2015-11-26 DIAGNOSIS — R590 Localized enlarged lymph nodes: Secondary | ICD-10-CM | POA: Insufficient documentation

## 2015-11-26 DIAGNOSIS — C786 Secondary malignant neoplasm of retroperitoneum and peritoneum: Secondary | ICD-10-CM | POA: Diagnosis not present

## 2015-11-26 DIAGNOSIS — Z66 Do not resuscitate: Secondary | ICD-10-CM

## 2015-11-26 DIAGNOSIS — I952 Hypotension due to drugs: Secondary | ICD-10-CM

## 2015-11-26 DIAGNOSIS — C787 Secondary malignant neoplasm of liver and intrahepatic bile duct: Secondary | ICD-10-CM

## 2015-11-26 LAB — CBC WITH DIFFERENTIAL/PLATELET
BASO%: 0.1 % (ref 0.0–2.0)
BASOS ABS: 0 10*3/uL (ref 0.0–0.1)
EOS%: 0.3 % (ref 0.0–7.0)
Eosinophils Absolute: 0 10*3/uL (ref 0.0–0.5)
HEMATOCRIT: 24.2 % — AB (ref 34.8–46.6)
HGB: 7.8 g/dL — ABNORMAL LOW (ref 11.6–15.9)
LYMPH#: 1.6 10*3/uL (ref 0.9–3.3)
LYMPH%: 13.8 % — AB (ref 14.0–49.7)
MCH: 24.8 pg — AB (ref 25.1–34.0)
MCHC: 32.2 g/dL (ref 31.5–36.0)
MCV: 76.8 fL — ABNORMAL LOW (ref 79.5–101.0)
MONO#: 0.9 10*3/uL (ref 0.1–0.9)
MONO%: 7.7 % (ref 0.0–14.0)
NEUT#: 9.1 10*3/uL — ABNORMAL HIGH (ref 1.5–6.5)
NEUT%: 78.1 % — AB (ref 38.4–76.8)
PLATELETS: 336 10*3/uL (ref 145–400)
RBC: 3.15 10*6/uL — AB (ref 3.70–5.45)
RDW: 15.7 % — ABNORMAL HIGH (ref 11.2–14.5)
WBC: 11.6 10*3/uL — ABNORMAL HIGH (ref 3.9–10.3)

## 2015-11-26 LAB — COMPREHENSIVE METABOLIC PANEL
ALT: 25 U/L (ref 0–55)
ANION GAP: 13 meq/L — AB (ref 3–11)
AST: 46 U/L — ABNORMAL HIGH (ref 5–34)
Albumin: 2.3 g/dL — ABNORMAL LOW (ref 3.5–5.0)
Alkaline Phosphatase: 129 U/L (ref 40–150)
BUN: 8.8 mg/dL (ref 7.0–26.0)
CHLORIDE: 103 meq/L (ref 98–109)
CO2: 22 meq/L (ref 22–29)
CREATININE: 0.8 mg/dL (ref 0.6–1.1)
Calcium: 9.5 mg/dL (ref 8.4–10.4)
EGFR: 85 mL/min/{1.73_m2} — ABNORMAL LOW (ref >90)
Glucose: 89 mg/dl (ref 70–140)
Potassium: 3.5 mEq/L (ref 3.5–5.1)
Sodium: 137 mEq/L (ref 136–145)
Total Bilirubin: 0.39 mg/dL (ref 0.20–1.20)
Total Protein: 7.8 g/dL (ref 6.4–8.3)

## 2015-11-26 LAB — PREPARE RBC (CROSSMATCH)

## 2015-11-26 MED ORDER — SODIUM CHLORIDE 0.9 % IJ SOLN
10.0000 mL | INTRAMUSCULAR | Status: DC | PRN
  Administered 2015-11-26: 10 mL via INTRAVENOUS
  Filled 2015-11-26: qty 10

## 2015-11-26 MED ORDER — IOPAMIDOL (ISOVUE-300) INJECTION 61%
100.0000 mL | Freq: Once | INTRAVENOUS | Status: AC | PRN
  Administered 2015-11-26: 100 mL via INTRAVENOUS

## 2015-11-26 MED ORDER — HEPARIN SOD (PORK) LOCK FLUSH 100 UNIT/ML IV SOLN
500.0000 [IU] | Freq: Every day | INTRAVENOUS | Status: AC | PRN
  Administered 2015-11-26: 500 [IU]
  Filled 2015-11-26: qty 5

## 2015-11-26 MED ORDER — SODIUM CHLORIDE 0.9 % IV SOLN
250.0000 mL | Freq: Once | INTRAVENOUS | Status: AC
  Administered 2015-11-26: 250 mL via INTRAVENOUS

## 2015-11-26 MED ORDER — SODIUM CHLORIDE 0.9% FLUSH: 3.0000 mL | INTRAVENOUS | Status: DC | PRN

## 2015-11-26 MED ORDER — SODIUM CHLORIDE 0.9% FLUSH
10.0000 mL | INTRAVENOUS | Status: AC | PRN
  Administered 2015-11-26: 10 mL

## 2015-11-26 MED ORDER — HEPARIN SOD (PORK) LOCK FLUSH 100 UNIT/ML IV SOLN: 250.0000 [IU] | INTRAVENOUS | Status: DC | PRN

## 2015-11-26 MED ORDER — ACETAMINOPHEN 325 MG PO TABS
325.0000 mg | ORAL_TABLET | Freq: Once | ORAL | Status: AC
  Administered 2015-11-26: 325 mg via ORAL
  Filled 2015-11-26: qty 1

## 2015-11-26 NOTE — Telephone Encounter (Signed)
Medical Oncology  Spoke with patient by phone now, back at home after transfusion 2 units PRBCs today. She feels much stronger, no problems with transfusion. She has eaten some supper and is drinking green tea.  Discussed progression on CT today. Patient understands that there are other chemo regimens that can be considered, vs supportive care / symptom management. She does not seem inclined to try more chemo, at least in this discussion. She will consider, will call if questions or if we can help prior to MD visit on 12-10-15.  Patient requested that I call her son, which I have done now on his cell. Told him that she is feeling better this evening, but scans show progression in liver and in abdomen. Mentioned chemo vs supportive care as above.  Patient and son thanked me for calling.  Godfrey Pick, MD

## 2015-11-26 NOTE — Discharge Instructions (Signed)
Blood Transfusion, Care After  These instructions give you information about caring for yourself after your procedure. Your doctor may also give you more specific instructions. Call your doctor if you have any problems or questions after your procedure.   HOME CARE   Take medicines only as told by your doctor. Ask your doctor if you can take an over-the-counter pain reliever if you have a fever or headache a day or two after your procedure.   Return to your normal activities as told by your doctor.  GET HELP IF:    You develop redness or irritation at your IV site.   You have a fever, chills, or a headache that does not go away.   Your pee (urine) is darker than normal.   Your urine turns:    Pink.    Red.    Brown.   The white part of your eye turns yellow (jaundice).   You feel weak after doing your normal activities.  GET HELP RIGHT AWAY IF:    You have trouble breathing.   You have fever and chills and you also have:    Anxiety.    Chest or back pain.    Flushed or pink skin.    Clammy or sweaty skin.    A fast heartbeat.    A sick feeling in your stomach (nausea).     This information is not intended to replace advice given to you by your health care provider. Make sure you discuss any questions you have with your health care provider.     Document Released: 04/11/2014 Document Reviewed: 04/11/2014  Elsevier Interactive Patient Education 2016 [employer]

## 2015-11-26 NOTE — Telephone Encounter (Signed)
Jodie with [facility] calling to confirm receipt of results of today's CT Abd/Pelvis.  We will fax if you do not have access to EPIC."  Will notify Dr. Marko Plume.     IMPRESSION: 1. Marked progression of hepatic metastasis. 2. Marked progression of peritoneal metastasis now with a 13 cm LEFT upper quadrant peritoneal mass. 3. Increase in retroperitoneal and LEFT iliac lymphadenopathy. 4. Two small pulmonary nodules are new or increased from comparison exam and concerning for early pulmonary metastasis . These results will be called to the ordering clinician or representative by the Radiologist Assistant, and communication documented in the PACS or zVision Dashboard.   Electronically Signed   By: Suzy Bouchard M.D.   On: 11/26/2015 11:44

## 2015-11-26 NOTE — Telephone Encounter (Signed)
Call from [facility] asking when Patient will be  Ready for pick up.  Called sickle cell to learn sheh has thirty minutes or less.  Advised on where Sickle Cell is located.

## 2015-11-26 NOTE — Progress Notes (Signed)
Diagnosis Association: Ovarian cancer, left (HCC) (C56.2)  Dr: Carlean Jews. [place]  Procedure: Pt received 2 units of PRBCs via porta cath which was already accessed. Blood returned noted prior to use.  Pt tolerated procedure well.  Post procedure: Pt alert, oriented and ambulatory at discharge.

## 2015-11-26 NOTE — Progress Notes (Signed)
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 She had CT AP earlier this AM, which had not been read at time of visit at [facility]. She is for 2 unit PRBC transfusion following MD visit today, for hgb down to 7.8 today.   Patient actually has felt  Some better since she was seen on 11-23-15, with antihypertensive medication DCd and IVF given. She has  Intermittent discomfort especially LUQ laterally and some in high RUQ, not improved with BM and not obviously activity related.  She has been eating small amounts and drinking green tea; bowels have moved. She has had no obvious bleeding. She denies SOB at rest. She has had no fever. She denies chest pain or cough. No problemw with PAC.  No increased swelling LE.  Remainder of 10 point Review of Systems negative.     PAC in Genetics testing sent 03-02-15, normal by Breast Ovarian Panel by [facility] CA 125 was 1406 in 10-2013   ONCOLOGIC HISTORY Patient had acute abdominal pain Dec 2014, which resolved without medical attention, then vague diffuse abdominal discomfort, low grade nausea and abdominal bloating. She was seen in June 2015 for first gyn exam in years, PAP with AGUS and follow up biopsies of endocervix and endometrium by Dr Pamala Hurry both with serous apparent endometrial cancer (pathology from [facility] 09-12-13, case # [id number] to be scanned into this EMR). Biopsy of cervix had normal squamous epithelium. CA125 from [facility] 09-16-13 was 964, and was up to 1406 by 10-23-13.Marland Kitchen She was seen by Dr Denman George on 09-23-13, her exam remarkable for large pelvic mass minimally mobile and extending to umbilicus. She had CT CAP 09-26-13 had prominent but not enlarged juxtapericardiac lymph nodes, trace right pleural effusion, large amount of abnormal soft tissue thruout peritoneal  cavity, predominately with omentum and especially LUQ, largest area 3.8 x 3.0 x 4.0 cm anterior to proximal descending colon, implants adjacent to liver, large complex multicystic lesion in pelvis inseperable from uterus and adnexae, with mass effect on bladder, questionable area in central aspect of dome of liver, borderline enlarged retroperitoneal nodes. Due to extent of disease, treatment began with chemotherapy. Cycle one taxol carboplatin was given on 5-68-12, complicated by severe nausea/vomiting, constipation and severe taxol aches. Cycle 2 was changed to dose dense carbo taxol, day 1 on 11-01-13. She needed gCSF support by cycle 3. Neoadjuvant chemo was one cycle carbo taxol standard dose and 3 cycles dose dense. 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 She resumed chemo with cycle 4 on 02-21-14 and completed cycle 6 on 04-25-14. She had unremarkable exam by Dr Denman George 08-11-14, however increase in CA 125 marker then, from 8 in 05-2014 to 26 in 08-2014, and repeat 63 on 09-08-14. CT AP 09-16-14 shows progressive peritoneal metastases, increasing retroperitoneal and transverse mesocolon nodes and soft tissue area at vaginal cuff stable to slightly decreased. She began doxil on 10-10-14, CA 125 151 that day as baseline for doxil. She had 4 cycles of doxil thru 01-09-15, then CT 02-11-15 showed mixed response but with multiple new liver metastases. CA 125 on 02-02-15 was 299. She began weekly taxol with avastin 03-12-15. Restaging CT AP after 3 cycles 05-29-15 showed improvement in omental and vaginal  cuff involvement, single liver lesion slightly larger and right external iliac node increased, no other new disease, other liver areas stable to slightly smaller. Taxol avastin continued thru day 8 cycle 6 on 08-06-15, with repeat CT AP 09-02-15 showing increase in hepatic lesionsand scattered peritoneal  involvement. She began letrozole on 09-03-15.  Objective:  Vital signs in last 24 hours:  BP 113/88 (BP Location: Right Arm, Patient Position: Sitting)   Pulse (!) 132   Temp 98.7 F (37.1 C) (Oral)   Resp 18   Ht _0  (1.626 m)   Wt 221 lb 12.8 oz (100.6 kg)   SpO2 100%   BMI 38.07 kg/m  Weight up 2 lbs from 11-23-15. Alert, oriented and appropriate. Ambulatory without assistance  No alopecia. Respirations not labored seated in exam room on RA. She appears more comfortable than at visit 11-23-15.  HEENT:PERRL, sclerae not icteric. Oral mucosa moist without lesions, posterior pharynx clear.  Neck supple. No JVD.  Lymphatics:no cervical,supraclavicular adenopathy Resp: clear to auscultation bilaterally and normal percussion bilaterally Cardio: regular rate and rhythm. No gallop. GI: abdomen obese, soft, not tender to gentle exam including upper quadrants, not obviously distended, cannot appreciate mass or organomegaly. Some bowel sounds.  Musculoskeletal/ Extremities: without pitting edema, cords, tenderness Neuro: no peripheral neuropathy. Otherwise nonfocal Skin without rash, ecchymosis, petechiae Portacath-without erythema or tenderness  Lab Results:  Results for orders placed or performed in visit on 11/26/15  CBC with Differential  Result Value Ref Range   WBC 11.6 (H) 3.9 - 10.3 10e3/uL   NEUT# 9.1 (H) 1.5 - 6.5 10e3/uL   HGB 7.8 (L) 11.6 - 15.9 g/dL   HCT 24.2 (L) 34.8 - 46.6 %   Platelets 336 145 - 400 10e3/uL   MCV 76.8 (L) 79.5 - 101.0 fL   MCH 24.8 (L) 25.1 - 34.0 pg   MCHC 32.2 31.5 - 36.0 g/dL   RBC 3.15 (L) 3.70 - 5.45 10e6/uL   RDW 15.7 (H) 11.2 - 14.5 %   lymph# 1.6 0.9 - 3.3 10e3/uL   MONO# 0.9 0.1 - 0.9 10e3/uL   Eosinophils Absolute 0.0 0.0 - 0.5 10e3/uL   Basophils Absolute 0.0 0.0 - 0.1 10e3/uL   NEUT% 78.1 (H) 38.4 - 76.8 %   LYMPH% 13.8 (L) 14.0 - 49.7 %   MONO% 7.7 0.0 - 14.0 %   EOS% 0.3 0.0 - 7.0 %   BASO% 0.1 0.0 - 2.0 %  Comprehensive  metabolic panel  Result Value Ref Range   Sodium 137 136 - 145 mEq/L   Potassium 3.5 3.5 - 5.1 mEq/L   Chloride 103 98 - 109 mEq/L   CO2 22 22 - 29 mEq/L   Glucose 89 70 - 140 mg/dl   BUN 8.8 7.0 - 26.0 mg/dL   Creatinine 0.8 0.6 - 1.1 mg/dL   Total Bilirubin 0.39 0.20 - 1.20 mg/dL   Alkaline Phosphatase 129 40 - 150 U/L   AST 46 (H) 5 - 34 U/L   ALT 25 0 - 55 U/L   Total Protein 7.8 6.4 - 8.3 g/dL   Albumin 2.3 (L) 3.5 - 5.0 g/dL   Calcium 9.5 8.4 - 10.4 mg/dL   Anion Gap 13 (H) 3 - 11 mEq/L   EGFR 85 (L) >90 ml/min/1.73 m2   CA 125 on 11-23-15    2189 Hemoccult x3 given with instructions  Studies/Results: CT resulted after visit, information communicated by phone to patient and to son: see MD phone notes  Ct  Abdomen Pelvis W Wo Contrast  Result Date: 11/26/2015 CLINICAL DATA:  Ovarian carcinoma with liver metastasis. Peritoneal metastasis. Ongoing chemotherapy. Nausea and vomiting for 2-3 weeks. Rising CA 125 EXAM: CT ABDOMEN AND PELVIS WITHOUT AND WITH CONTRAST TECHNIQUE: Multidetector CT imaging of the abdomen and pelvis was performed following the standard protocol before and following the bolus administration of intravenous contrast. CONTRAST:  111m ISOVUE-300 IOPAMIDOL (ISOVUE-300) INJECTION 61% COMPARISON:  CT 09/02/2015 FINDINGS: Lower chest: Within the RIGHT lower lobe just above the diaphragm 5 mm round nodule (image 10, series 7) is new from prior. In the posterior LEFT lower lobe 8 mm nodule adjacent to the pleural surfaces increased from 6 mm (image 37 series 7) Hepatobiliary: Marked progression of hepatic metastasis. Significantly enlarged lesions in LEFT and RIGHT hepatic lobe. Example lesion subcapsular LEFT hepatic lobe measures 6.8 cm increased from 1.5 cm. Lesion in the lateral LEFT hepatic lobe measures 6.0 cm increased from 3.2 cm. Lesion in the inferior RIGHT hepatic lobe measures 4.4 cm increased from 0.9 cm. Pancreas: Pancreas is normal. No ductal dilatation. No  pancreatic inflammation. Spleen: Normal spleen Adrenals/urinary tract: RIGHT adrenal adenoma unchanged. Kidneys normal. Stomach/Bowel: Large hiatal hernia. The stomach, duodenum and small bowel are normal. No evidence of bowel obstruction. Colon and rectosigmoid colon normal without evidence obstruction. Vascular/Lymphatic: Abdominal aorta is normal caliber. New retroperitoneal periaortic lymphadenopathy. Example lymph node measures 18 mm positioned between the aorta and IVC increased from 6 mm. Increase in LEFT external iliac lymphadenopathy. Example lymph node measures 15 mm short axis (image 121 series 5). Adjacent 16 mm LEFT obturator node. Reproductive: Post hysterectomy.  Ovaries not identified Other: Dominant finding is massive enlargement peritoneal metastasis in the LEFT upper quadrant. Lesion measures 13 cm x 8.5 cm (image 157, series 5) increased from 4.3 x 3.7 cm. Peritoneal nodule in the deep pelvis measuring 2.8 cm increased from 1.2 cm (image 111, series 5. Musculoskeletal: Stable sclerotic lesion in the RIGHT iliac bone. IMPRESSION: 1. Marked progression of hepatic metastasis. 2. Marked progression of peritoneal metastasis now with a 13 cm LEFT upper quadrant peritoneal mass. 3. Increase in retroperitoneal and LEFT iliac lymphadenopathy. 4. Two small pulmonary nodules are new or increased from comparison exam and concerning for early pulmonary metastasis . These results will be called to the ordering clinician or representative by the Radiologist Assistant, and communication documented in the PACS or zVision Dashboard. Electronically Signed   By: SSuzy BouchardM.D.   On: 11/26/2015 11:44   PACs images reviewed by MD   Medications: I have reviewed the patient's current medications. She has stopped lisinopril HCTZ. With phone conversation, told to stop lisinopril HCTZ  DISCUSSION Patient understands that I will let her know about CT later today.  Discussed advance directives, which she  would now like to complete. I will ask [facility] SW to assist when she is at office again.  [facility]   CT information confirms progressive disease including liver, LUQ abdomen and multiple other areas as noted. Per phone conversation with patient, she understands that this is not situation of potential cure. I have told her that other chemotherapy can be tried in palliative attempt, tho it would also be reasonable to focus on symptom management without additional aggressive treatment.  She is platinum resistand given recurrence 4 months from completion of adjuvant treatment. She progressed thru doxil with new liver mets, and progressed on taxol  avastin. She has had further progression on letrozole, tho fortunately she initially felt well enough on the hormone blocker to enjoy  family celebrations. She has not had gemzar or alimta.   At completion of phone calls, plan is for patient and family to consider this information and will discuss further at least at my visit on 12-10-15.   Assessment/Plan:  1. 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 Recurrent disease found by increasing CA 125 initially 08-2014 . Has progressed on doxil,  taxol avastin and letrozole. Considering further treatment in palliative attempt vs palliative interventions without further systemic treatment.  Genetics testing normal 2.HTN previously with avastin, requiring lisinopril HCTZ then. BP low this week, improved now off antihypertensives  3. PAC in, flushed 11-23-15 4. intermittent low back discomfort and rIght hip pain , degenerative and weight related, not complaining of this now 5.Anemia progressive, no clear bleeding, down <8 with better hydration today. Transfuse 2 units PRBCs today. 6.neuropathy symptoms  from taxol feet>hands no worse 7.Patient does not want resuscitation or life support in irreversible situation, and understands that this malignancy is not curable. She now would like to complete advance directives.  8.EF good by echocardiogram prior to #4 doxil 9..morbid obesity  Questions answered at visit and by phone after visit. Transfusion orders confirmed. Time spent 35 min including >50% counseling and coordination of care. Cc PCP and update Dr Leone Payor, MD   11/26/2015, 12:33 PM

## 2015-11-27 DIAGNOSIS — C562 Malignant neoplasm of left ovary: Secondary | ICD-10-CM | POA: Diagnosis not present

## 2015-11-27 LAB — TYPE AND SCREEN
ABO/RH(D): O POS
ANTIBODY SCREEN: NEGATIVE
UNIT DIVISION: 0
UNIT DIVISION: 0

## 2015-11-28 DIAGNOSIS — Z66 Do not resuscitate: Secondary | ICD-10-CM | POA: Insufficient documentation

## 2015-12-02 ENCOUNTER — Encounter: Payer: Self-pay | Admitting: *Deleted

## 2015-12-02 NOTE — Progress Notes (Signed)
Clinical Social Work received referral to complete healthcare advance directives.  CSW left voicemail for patient to return call to schedule appointment.  Polo Riley, MSW, LCSW, OSW-C Clinical Social Worker [facility] [phone]

## 2015-12-07 ENCOUNTER — Other Ambulatory Visit: Payer: Self-pay | Admitting: Oncology

## 2015-12-10 ENCOUNTER — Telehealth: Payer: Self-pay | Admitting: Oncology

## 2015-12-10 ENCOUNTER — Ambulatory Visit (HOSPITAL_BASED_OUTPATIENT_CLINIC_OR_DEPARTMENT_OTHER): Payer: Medicare Other | Admitting: Oncology

## 2015-12-10 ENCOUNTER — Encounter: Payer: Self-pay | Admitting: Oncology

## 2015-12-10 ENCOUNTER — Other Ambulatory Visit (HOSPITAL_BASED_OUTPATIENT_CLINIC_OR_DEPARTMENT_OTHER): Payer: Medicare Other

## 2015-12-10 ENCOUNTER — Ambulatory Visit (HOSPITAL_BASED_OUTPATIENT_CLINIC_OR_DEPARTMENT_OTHER): Payer: Medicare Other

## 2015-12-10 VITALS — BP 134/94 | HR 124 | Temp 98.2°F | Resp 18 | Ht 64.0 in | Wt 218.7 lb

## 2015-12-10 DIAGNOSIS — Z95828 Presence of other vascular implants and grafts: Secondary | ICD-10-CM

## 2015-12-10 DIAGNOSIS — Z720 Tobacco use: Secondary | ICD-10-CM

## 2015-12-10 DIAGNOSIS — C562 Malignant neoplasm of left ovary: Secondary | ICD-10-CM

## 2015-12-10 DIAGNOSIS — G893 Neoplasm related pain (acute) (chronic): Secondary | ICD-10-CM

## 2015-12-10 DIAGNOSIS — Z66 Do not resuscitate: Secondary | ICD-10-CM

## 2015-12-10 DIAGNOSIS — C569 Malignant neoplasm of unspecified ovary: Secondary | ICD-10-CM

## 2015-12-10 DIAGNOSIS — C787 Secondary malignant neoplasm of liver and intrahepatic bile duct: Secondary | ICD-10-CM

## 2015-12-10 DIAGNOSIS — F419 Anxiety disorder, unspecified: Secondary | ICD-10-CM

## 2015-12-10 DIAGNOSIS — I952 Hypotension due to drugs: Secondary | ICD-10-CM

## 2015-12-10 LAB — COMPREHENSIVE METABOLIC PANEL
ALT: 17 U/L (ref 0–55)
ANION GAP: 13 meq/L — AB (ref 3–11)
AST: 45 U/L — ABNORMAL HIGH (ref 5–34)
Albumin: 2.1 g/dL — ABNORMAL LOW (ref 3.5–5.0)
Alkaline Phosphatase: 149 U/L (ref 40–150)
BILIRUBIN TOTAL: 0.5 mg/dL (ref 0.20–1.20)
BUN: 9.5 mg/dL (ref 7.0–26.0)
CHLORIDE: 102 meq/L (ref 98–109)
CO2: 23 meq/L (ref 22–29)
Calcium: 9.1 mg/dL (ref 8.4–10.4)
Creatinine: 0.7 mg/dL (ref 0.6–1.1)
GLUCOSE: 86 mg/dL (ref 70–140)
Potassium: 3.4 mEq/L — ABNORMAL LOW (ref 3.5–5.1)
SODIUM: 138 meq/L (ref 136–145)
TOTAL PROTEIN: 7.4 g/dL (ref 6.4–8.3)

## 2015-12-10 LAB — TECHNOLOGIST REVIEW

## 2015-12-10 LAB — CBC WITH DIFFERENTIAL/PLATELET
BASO%: 0.1 % (ref 0.0–2.0)
BASOS ABS: 0 10*3/uL (ref 0.0–0.1)
EOS%: 0.1 % (ref 0.0–7.0)
Eosinophils Absolute: 0 10*3/uL (ref 0.0–0.5)
HCT: 26.9 % — ABNORMAL LOW (ref 34.8–46.6)
HGB: 8.6 g/dL — ABNORMAL LOW (ref 11.6–15.9)
LYMPH%: 9.2 % — AB (ref 14.0–49.7)
MCH: 25.1 pg (ref 25.1–34.0)
MCHC: 32 g/dL (ref 31.5–36.0)
MCV: 78.4 fL — AB (ref 79.5–101.0)
MONO#: 1.4 10*3/uL — ABNORMAL HIGH (ref 0.1–0.9)
MONO%: 10 % (ref 0.0–14.0)
NEUT%: 80.6 % — ABNORMAL HIGH (ref 38.4–76.8)
NEUTROS ABS: 10.9 10*3/uL — AB (ref 1.5–6.5)
NRBC: 0 % (ref 0–0)
PLATELETS: 439 10*3/uL — AB (ref 145–400)
RBC: 3.43 10*6/uL — AB (ref 3.70–5.45)
RDW: 17.4 % — AB (ref 11.2–14.5)
WBC: 13.5 10*3/uL — AB (ref 3.9–10.3)
lymph#: 1.2 10*3/uL (ref 0.9–3.3)

## 2015-12-10 MED ORDER — HEPARIN SOD (PORK) LOCK FLUSH 100 UNIT/ML IV SOLN
500.0000 [IU] | Freq: Once | INTRAVENOUS | Status: AC | PRN
  Administered 2015-12-10: 500 [IU] via INTRAVENOUS
  Filled 2015-12-10: qty 5

## 2015-12-10 MED ORDER — LORAZEPAM 1 MG PO TABS: ORAL_TABLET | ORAL | 0 refills | Status: AC

## 2015-12-10 MED ORDER — SODIUM CHLORIDE 0.9 % IJ SOLN
10.0000 mL | INTRAMUSCULAR | Status: DC | PRN
  Administered 2015-12-10: 10 mL via INTRAVENOUS
  Filled 2015-12-10: qty 10

## 2015-12-10 MED ORDER — HYDROCODONE-ACETAMINOPHEN 5-325 MG PO TABS: ORAL_TABLET | ORAL | 0 refills | Status: AC

## 2015-12-10 NOTE — Progress Notes (Signed)
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 CT AP 11-26-15 had marked progression of hepatic and peritoneal metastases.  Treatment to this point has been neoadjuvant and adjuvant carboplatin taxol, doxil, weekly taxol with avastin, and letrozole used June thru August 2017.   She was transfused 2 units PRBcs on 11-26-15, which seemed somewhat helpful, and remains off antihypertensives. She still notices feeling lightheaded or "woozy" at times, but better than prior to PRBCs / while on BP meds.  She has variable pain in area of lower ribs and abdomen, for which she has used no medication including no tylenol in last week or longer. Bowels are loose despite holding miralax. She has some nausea occasionally, no recent vomiting. She is drinking green tea and water, and enjoyed collards, corn bread and other regular diet yesterday. She is sleeping very little, cannot get comfortable. She denies SOB at rest, fever, bleeding, bladder symptoms. No problems with PAC.  Remainder of 10 point Review of Systems negative.     PAC in Genetics testing sent 03-02-15, normal by Breast Ovarian Panel by [facility] CA 125 was 1406 in 10-2013   ONCOLOGIC HISTORY Patient had acute abdominal pain Dec 2014, which resolved without medical attention, then vague diffuse abdominal discomfort, low grade nausea and abdominal bloating. She was seen in June 2015 for first gyn exam in years, PAP with AGUS and follow up biopsies of endocervix and endometrium by Dr Pamala Hurry both with serous apparent endometrial cancer (pathology from [facility] 09-12-13, case # [id number] to be scanned into this EMR). Biopsy of cervix had normal squamous epithelium. CA125 from [facility] 09-16-13 was 964, and was up to 1406 by  10-23-13.Marland Kitchen She was seen by Dr Denman George on 09-23-13, her exam remarkable for large pelvic mass minimally mobile and extending to umbilicus. She had CT CAP 09-26-13 had prominent but not enlarged juxtapericardiac lymph nodes, trace right pleural effusion, large amount of abnormal soft tissue thruout peritoneal cavity, predominately with omentum and especially LUQ, largest area 3.8 x 3.0 x 4.0 cm anterior to proximal descending colon, implants adjacent to liver, large complex multicystic lesion in pelvis inseperable from uterus and adnexae, with mass effect on bladder, questionable area in central aspect of dome of liver, borderline enlarged retroperitoneal nodes. Due to extent of disease, treatment began with chemotherapy. Cycle one taxol carboplatin was given on 4-31-54, complicated by severe nausea/vomiting, constipation and severe taxol aches. Cycle 2 was changed to dose dense carbo taxol, day 1 on 11-01-13. She needed gCSF support by cycle 3. Neoadjuvant chemo was one cycle carbo taxol standard dose and 3 cycles dose dense. 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 She resumed chemo with cycle 4 on 02-21-14 and completed cycle 6 on 04-25-14. She had unremarkable exam by Dr Denman George 08-11-14, however increase in CA 125 marker then, from 8 in 05-2014 to 26 in 08-2014, and repeat 63 on 09-08-14. CT AP 09-16-14 shows progressive peritoneal metastases, increasing retroperitoneal and transverse mesocolon nodes and soft tissue area at vaginal cuff stable to slightly decreased. She began doxil on 10-10-14, CA 125 151 that day as baseline for doxil. She had 4 cycles of doxil thru 01-09-15, then CT 02-11-15  showed mixed response but with multiple new liver metastases. CA 125 on 02-02-15 was 299. She began weekly taxol with avastin 03-12-15. Restaging CT AP after 3 cycles 05-29-15 showed improvement in omental and vaginal  cuff involvement, single liver lesion slightly larger and right external iliac node increased, no other new disease, other liver areas stable to slightly smaller. Taxol avastin continued thru day 8 cycle 6 on 08-06-15, with repeat CT AP 09-02-15 showing increase in hepatic lesionsand scattered peritoneal involvement. She began letrozole on 09-03-15, DCd with progression 11-2015. .  Objective:  Vital signs in last 24 hours:  BP (!) 134/94 (BP Location: Left Arm, Patient Position: Sitting)   Pulse (!) 124   Temp 98.2 F (36.8 C) (Oral)   Resp 18   Ht 5' 4"  (1.626 m)   Wt 218 lb 11.2 oz (99.2 kg)   SpO2 97%   BMI 37.54 kg/m  Weight down 2 lbs Alert, oriented and appropriate. Ambulatory without assistance. Respirations not labored seated on room air.  HEENT:PERRL, sclerae not icteric. Oral mucosa moist without lesions, posterior pharynx clear.  Neck supple. No JVD.  Lymphatics:no cervical,supraclavicular adenopathy Resp: clear to auscultation bilaterally and normal percussion bilaterally Cardio: regular rate and rhythm. No gallop. GI: soft, nontender,abdomen full not tightly distended, no mass or organomegaly. Some bowel sounds.  Musculoskeletal/ Extremities: without pitting edema, cords, tenderness Neuro: no peripheral neuropathy. Otherwise nonfocal Skin without rash, ecchymosis, petechiae Breasts: without dominant mass, skin or nipple findings. Axillae benign. Portacath-without erythema or tenderness  Lab Results:  Results for orders placed or performed in visit on 11/26/15  CBC with Differential  Result Value Ref Range   WBC 11.6 (H) 3.9 - 10.3 10e3/uL   NEUT# 9.1 (H) 1.5 - 6.5 10e3/uL   HGB 7.8 (L) 11.6 - 15.9 g/dL   HCT 24.2 (L) 34.8 - 46.6 %   Platelets 336 145 - 400 10e3/uL   MCV 76.8 (L) 79.5 - 101.0 fL   MCH 24.8 (L) 25.1 - 34.0 pg   MCHC 32.2 31.5 - 36.0 g/dL   RBC 3.15 (L) 3.70 - 5.45 10e6/uL   RDW 15.7 (H) 11.2 - 14.5 %   lymph# 1.6 0.9 - 3.3 10e3/uL   MONO# 0.9 0.1  - 0.9 10e3/uL   Eosinophils Absolute 0.0 0.0 - 0.5 10e3/uL   Basophils Absolute 0.0 0.0 - 0.1 10e3/uL   NEUT% 78.1 (H) 38.4 - 76.8 %   LYMPH% 13.8 (L) 14.0 - 49.7 %   MONO% 7.7 0.0 - 14.0 %   EOS% 0.3 0.0 - 7.0 %   BASO% 0.1 0.0 - 2.0 %  Comprehensive metabolic panel  Result Value Ref Range   Sodium 137 136 - 145 mEq/L   Potassium 3.5 3.5 - 5.1 mEq/L   Chloride 103 98 - 109 mEq/L   CO2 22 22 - 29 mEq/L   Glucose 89 70 - 140 mg/dl   BUN 8.8 7.0 - 26.0 mg/dL   Creatinine 0.8 0.6 - 1.1 mg/dL   Total Bilirubin 0.39 0.20 - 1.20 mg/dL   Alkaline Phosphatase 129 40 - 150 U/L   AST 46 (H) 5 - 34 U/L   ALT 25 0 - 55 U/L   Total Protein 7.8 6.4 - 8.3 g/dL   Albumin 2.3 (L) 3.5 - 5.0 g/dL   Calcium 9.5 8.4 - 10.4 mg/dL   Anion Gap 13 (H) 3 - 11 mEq/L   EGFR 85 (L) >90 ml/min/1.73 m2     Studies/Results: Ct Abdomen Pelvis 11-26-15  Result Date: 11/26/2015 CLINICAL DATA:  Ovarian carcinoma with liver metastasis. Peritoneal metastasis. Ongoing chemotherapy. Nausea and vomiting for 2-3 weeks. Rising CA 125 EXAM: CT ABDOMEN AND PELVIS WITHOUT AND WITH CONTRAST TECHNIQUE: Multidetector CT imaging of the abdomen and pelvis was performed following the standard protocol before and following the bolus administration of intravenous contrast. CONTRAST:  119m ISOVUE-300 IOPAMIDOL (ISOVUE-300) INJECTION 61% COMPARISON:  CT 09/02/2015 FINDINGS: Lower chest: Within the RIGHT lower lobe just above the diaphragm 5 mm round nodule (image 10, series 7) is new from prior. In the posterior LEFT lower lobe 8 mm nodule adjacent to the pleural surfaces increased from 6 mm (image 37 series 7) Hepatobiliary: Marked progression of hepatic metastasis. Significantly enlarged lesions in LEFT and RIGHT hepatic lobe. Example lesion subcapsular LEFT hepatic lobe measures 6.8 cm increased from 1.5 cm. Lesion in the lateral LEFT hepatic lobe measures 6.0 cm increased from 3.2 cm. Lesion in the inferior RIGHT hepatic lobe measures  4.4 cm increased from 0.9 cm. Pancreas: Pancreas is normal. No ductal dilatation. No pancreatic inflammation. Spleen: Normal spleen Adrenals/urinary tract: RIGHT adrenal adenoma unchanged. Kidneys normal. Stomach/Bowel: Large hiatal hernia. The stomach, duodenum and small bowel are normal. No evidence of bowel obstruction. Colon and rectosigmoid colon normal without evidence obstruction. Vascular/Lymphatic: Abdominal aorta is normal caliber. New retroperitoneal periaortic lymphadenopathy. Example lymph node measures 18 mm positioned between the aorta and IVC increased from 6 mm. Increase in LEFT external iliac lymphadenopathy. Example lymph node measures 15 mm short axis (image 121 series 5). Adjacent 16 mm LEFT obturator node. Reproductive: Post hysterectomy.  Ovaries not identified Other: Dominant finding is massive enlargement peritoneal metastasis in the LEFT upper quadrant. Lesion measures 13 cm x 8.5 cm (image 157, series 5) increased from 4.3 x 3.7 cm. Peritoneal nodule in the deep pelvis measuring 2.8 cm increased from 1.2 cm (image 111, series 5. Musculoskeletal: Stable sclerotic lesion in the RIGHT iliac bone. IMPRESSION: 1. Marked progression of hepatic metastasis. 2. Marked progression of peritoneal metastasis now with a 13 cm LEFT upper quadrant peritoneal mass. 3. Increase in retroperitoneal and LEFT iliac lymphadenopathy. 4. Two small pulmonary nodules are new or increased from comparison exam and concerning for early pulmonary metastasis .    Medications: I have reviewed the patient's current medications. Will try hydrocodone APAP at low dose 5-325 1 every 4 hrs prn and prn ativan.  DISCUSSION  As discussed with patient and with her son by phone after scan, we reviewed information of significant progression in liver and peritoneum. [facility].  We have discussed fact that this malignancy has progressed despite all of treatments as  noted above (carbo taxol, doxil, taxol avastin, letrozole). While there are other chemotherapy agents available that she has not had, all systemic treatments come with possible side effects and would be in palliative attempt. Patient understands that PS now does not give much buffer to tolerate other treatment side efects. She states that choosing how to spend her time now is a priority. I have told them that Hospice would be helpful now and can assist as long as she is not receiving aggressive treatment such as chemotherapy. I have explained that Hospice and other MDs work as a team, with goal to keep her as comfortable and as functional as possible. Niece is crying with this information;  patient does not want to pursue Hospcie referral today. They have had good experience with Hospice with another family  member.  We have talked again about her wishes as to resuscitation and life support, neither of which she wants given this advanced malignancy. She prefers not to meet with [facility] today re advance directives (SW had also LM on phone previously), but agreed to [employer] DNR, which they understand should be kept available in case EMS has to transport.   Will try the hydrocodone and prn ativan for next few days. She is to let my RN know early next week if she is adequately comfortable, able to sleep, and how much of the medication she has needed. Reminded her that she may have some constipation with pain medication, would need to increase laxatives if so.   Patient expresses her strong religious faith and appreciation for care.   Assessment/Plan:  1. 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 Has progressed on doxil,  taxol avastin and letrozole. Considering further treatment in palliative attempt vs palliative  interventions without further systemic treatment, patient more inclined to palliative care only but will discuss with son. We will speak with her by phone next week and I will see her back in 3-4 weeks. Genetics testing normal 2.HTN previouslywith avastin, requiring lisinopril HCTZ then. BP low last week, improved off of medication and less "woozy". She is anxious and upset now, which has probably elevated reading today. Follow.  3. PAC in, flushed 12-10-14 4. intermittent low back discomfort and rIght hip pain , degenerative and weight related, not complaining of this now 5.Anemia progressive, no clear bleeding, some (minimal ?) improvement with PRBCs on 11-26-15 6.neuropathy symptoms from taxol feet>hands no worse 7.Patient does not want resuscitation or life support in irreversible situation, and understands that this malignancy is not curable. She did not want  to complete advance directives today, but agrees with having DNR listed in EMR and took completed Out of Facility DNR paper.  8.EF good by echocardiogram prior to #4 doxil 9..morbid obesity 10. Continues some smoking, which she has not wanted to stop  All questions answered and she knows that I am glad to speak with son at any time. She will let us know early next week how she is with low dose pain medication for cancer related pain and with ativan; she knows to call prior if concerns. She will let us know if she wants Hospice referral now.  Time spent 30 min including >50% counseling and coordination of care. Route PCP, update Dr Denman George.   Evlyn Clines, MD   12/10/2015, 9:24 AM

## 2015-12-10 NOTE — Patient Instructions (Signed)
Prescriptions:  Ativan (lorazepam) 0.5 mg every 6 hrs as needed for nausea or to relax. This will make you drowsy.  Do not take together with the pain medicine.   Hydrocodone APAP  5-325:  This is low dose hydrocodone with tylenol.  Take one tablet at ~ 8 PM before you go to bed ~ 8:30 PM.  If you wake up 1:00 AM or later, you can take another one tablet.  Please call Dr Mariana Kaufman nurse on Mon 9-11 to let us know if you are more comfortable using medicines as above, and if you have been able to sleep. We can adjust if needed from there.  If you want to meet with Hospice, you can let Dr Marko Plume know that also

## 2015-12-10 NOTE — Telephone Encounter (Signed)
appt made and avs printed °

## 2015-12-13 ENCOUNTER — Encounter: Payer: Self-pay | Admitting: Oncology

## 2015-12-13 DIAGNOSIS — G893 Neoplasm related pain (acute) (chronic): Secondary | ICD-10-CM | POA: Insufficient documentation

## 2015-12-14 ENCOUNTER — Emergency Department (HOSPITAL_COMMUNITY): Payer: Medicare Other

## 2015-12-14 ENCOUNTER — Encounter (HOSPITAL_COMMUNITY): Payer: Self-pay | Admitting: *Deleted

## 2015-12-14 ENCOUNTER — Emergency Department (HOSPITAL_COMMUNITY)
Admission: EM | Admit: 2015-12-14 | Discharge: 2015-12-14 | Disposition: A | Discharge status: Home / Self Care | Payer: Medicare Other | Source: Home / Self Care | Attending: Emergency Medicine | Admitting: Emergency Medicine

## 2015-12-14 ENCOUNTER — Other Ambulatory Visit: Payer: Self-pay

## 2015-12-14 ENCOUNTER — Encounter (HOSPITAL_COMMUNITY): Payer: Self-pay | Admitting: Emergency Medicine

## 2015-12-14 ENCOUNTER — Observation Stay (HOSPITAL_COMMUNITY)
Admission: EM | Admit: 2015-12-14 | Discharge: 2015-12-16 | Disposition: A | Discharge status: Home / Self Care | Payer: Medicare Other | Attending: Internal Medicine | Admitting: Internal Medicine

## 2015-12-14 DIAGNOSIS — D6481 Anemia due to antineoplastic chemotherapy: Secondary | ICD-10-CM | POA: Diagnosis not present

## 2015-12-14 DIAGNOSIS — E860 Dehydration: Secondary | ICD-10-CM

## 2015-12-14 DIAGNOSIS — K219 Gastro-esophageal reflux disease without esophagitis: Secondary | ICD-10-CM | POA: Diagnosis not present

## 2015-12-14 DIAGNOSIS — R1110 Vomiting, unspecified: Secondary | ICD-10-CM

## 2015-12-14 DIAGNOSIS — Z9079 Acquired absence of other genital organ(s): Secondary | ICD-10-CM | POA: Diagnosis not present

## 2015-12-14 DIAGNOSIS — Z96652 Presence of left artificial knee joint: Secondary | ICD-10-CM | POA: Insufficient documentation

## 2015-12-14 DIAGNOSIS — Z515 Encounter for palliative care: Secondary | ICD-10-CM | POA: Diagnosis not present

## 2015-12-14 DIAGNOSIS — Z79899 Other long term (current) drug therapy: Secondary | ICD-10-CM

## 2015-12-14 DIAGNOSIS — F17210 Nicotine dependence, cigarettes, uncomplicated: Secondary | ICD-10-CM | POA: Insufficient documentation

## 2015-12-14 DIAGNOSIS — Z9221 Personal history of antineoplastic chemotherapy: Secondary | ICD-10-CM | POA: Insufficient documentation

## 2015-12-14 DIAGNOSIS — Z8543 Personal history of malignant neoplasm of ovary: Secondary | ICD-10-CM | POA: Insufficient documentation

## 2015-12-14 DIAGNOSIS — C787 Secondary malignant neoplasm of liver and intrahepatic bile duct: Secondary | ICD-10-CM | POA: Insufficient documentation

## 2015-12-14 DIAGNOSIS — Z803 Family history of malignant neoplasm of breast: Secondary | ICD-10-CM | POA: Diagnosis not present

## 2015-12-14 DIAGNOSIS — Z66 Do not resuscitate: Secondary | ICD-10-CM | POA: Diagnosis not present

## 2015-12-14 DIAGNOSIS — Z6837 Body mass index (BMI) 37.0-37.9, adult: Secondary | ICD-10-CM | POA: Insufficient documentation

## 2015-12-14 DIAGNOSIS — M47816 Spondylosis without myelopathy or radiculopathy, lumbar region: Secondary | ICD-10-CM | POA: Diagnosis not present

## 2015-12-14 DIAGNOSIS — R112 Nausea with vomiting, unspecified: Principal | ICD-10-CM | POA: Insufficient documentation

## 2015-12-14 DIAGNOSIS — I10 Essential (primary) hypertension: Secondary | ICD-10-CM | POA: Insufficient documentation

## 2015-12-14 DIAGNOSIS — G620 Drug-induced polyneuropathy: Secondary | ICD-10-CM | POA: Insufficient documentation

## 2015-12-14 DIAGNOSIS — C786 Secondary malignant neoplasm of retroperitoneum and peritoneum: Secondary | ICD-10-CM | POA: Diagnosis not present

## 2015-12-14 DIAGNOSIS — T451X5A Adverse effect of antineoplastic and immunosuppressive drugs, initial encounter: Secondary | ICD-10-CM | POA: Insufficient documentation

## 2015-12-14 DIAGNOSIS — M160 Bilateral primary osteoarthritis of hip: Secondary | ICD-10-CM | POA: Diagnosis not present

## 2015-12-14 DIAGNOSIS — E876 Hypokalemia: Secondary | ICD-10-CM | POA: Insufficient documentation

## 2015-12-14 DIAGNOSIS — Z90710 Acquired absence of both cervix and uterus: Secondary | ICD-10-CM | POA: Insufficient documentation

## 2015-12-14 DIAGNOSIS — R6510 Systemic inflammatory response syndrome (SIRS) of non-infectious origin without acute organ dysfunction: Secondary | ICD-10-CM | POA: Diagnosis not present

## 2015-12-14 DIAGNOSIS — D649 Anemia, unspecified: Secondary | ICD-10-CM

## 2015-12-14 DIAGNOSIS — R197 Diarrhea, unspecified: Secondary | ICD-10-CM | POA: Insufficient documentation

## 2015-12-14 DIAGNOSIS — C569 Malignant neoplasm of unspecified ovary: Secondary | ICD-10-CM

## 2015-12-14 DIAGNOSIS — Z90722 Acquired absence of ovaries, bilateral: Secondary | ICD-10-CM | POA: Insufficient documentation

## 2015-12-14 LAB — URINE MICROSCOPIC-ADD ON

## 2015-12-14 LAB — CBC
HCT: 27.6 % — ABNORMAL LOW (ref 36.0–46.0)
Hemoglobin: 8.9 g/dL — ABNORMAL LOW (ref 12.0–15.0)
MCH: 25.4 pg — ABNORMAL LOW (ref 26.0–34.0)
MCHC: 32.2 g/dL (ref 30.0–36.0)
MCV: 78.9 fL (ref 78.0–100.0)
PLATELETS: 533 10*3/uL — AB (ref 150–400)
RBC: 3.5 MIL/uL — AB (ref 3.87–5.11)
RDW: 17.9 % — AB (ref 11.5–15.5)
WBC: 17.4 10*3/uL — AB (ref 4.0–10.5)

## 2015-12-14 LAB — COMPREHENSIVE METABOLIC PANEL
ALK PHOS: 149 U/L — AB (ref 38–126)
ALT: 19 U/L (ref 14–54)
AST: 49 U/L — ABNORMAL HIGH (ref 15–41)
Albumin: 2.6 g/dL — ABNORMAL LOW (ref 3.5–5.0)
Anion gap: 11 (ref 5–15)
BILIRUBIN TOTAL: 0.6 mg/dL (ref 0.3–1.2)
BUN: 11 mg/dL (ref 6–20)
CALCIUM: 9.2 mg/dL (ref 8.9–10.3)
CO2: 26 mmol/L (ref 22–32)
CREATININE: 0.77 mg/dL (ref 0.44–1.00)
Chloride: 100 mmol/L — ABNORMAL LOW (ref 101–111)
Glucose, Bld: 92 mg/dL (ref 65–99)
Potassium: 3.4 mmol/L — ABNORMAL LOW (ref 3.5–5.1)
Sodium: 137 mmol/L (ref 135–145)
TOTAL PROTEIN: 7.7 g/dL (ref 6.5–8.1)

## 2015-12-14 LAB — URINALYSIS, ROUTINE W REFLEX MICROSCOPIC
Bilirubin Urine: NEGATIVE
Glucose, UA: NEGATIVE mg/dL
Hgb urine dipstick: NEGATIVE
KETONES UR: NEGATIVE mg/dL
LEUKOCYTES UA: NEGATIVE
Nitrite: NEGATIVE
PROTEIN: 30 mg/dL — AB
Specific Gravity, Urine: 1.019 (ref 1.005–1.030)
pH: 6 (ref 5.0–8.0)

## 2015-12-14 LAB — LIPASE, BLOOD: LIPASE: 15 U/L (ref 11–51)

## 2015-12-14 MED ORDER — ONDANSETRON 8 MG PO TBDP
8.0000 mg | ORAL_TABLET | Freq: Once | ORAL | Status: AC
  Administered 2015-12-14: 8 mg via ORAL
  Filled 2015-12-14: qty 1

## 2015-12-14 MED ORDER — SODIUM CHLORIDE 0.9 % IV BOLUS (SEPSIS)
2000.0000 mL | Freq: Once | INTRAVENOUS | Status: AC
  Administered 2015-12-14: 2000 mL via INTRAVENOUS

## 2015-12-14 MED ORDER — ALBUTEROL SULFATE (2.5 MG/3ML) 0.083% IN NEBU
5.0000 mg | INHALATION_SOLUTION | Freq: Once | RESPIRATORY_TRACT | Status: DC
  Filled 2015-12-14: qty 6

## 2015-12-14 MED ORDER — ONDANSETRON 8 MG PO TBDP: ORAL_TABLET | ORAL | 0 refills | Status: AC

## 2015-12-14 MED ORDER — ONDANSETRON HCL 4 MG/2ML IJ SOLN
4.0000 mg | Freq: Once | INTRAMUSCULAR | Status: AC
  Administered 2015-12-14: 4 mg via INTRAVENOUS
  Filled 2015-12-14: qty 2

## 2015-12-14 MED ORDER — HEPARIN SOD (PORK) LOCK FLUSH 100 UNIT/ML IV SOLN
500.0000 [IU] | Freq: Once | INTRAVENOUS | Status: AC
  Administered 2015-12-14: 500 [IU]
  Filled 2015-12-14: qty 5

## 2015-12-14 MED ORDER — POTASSIUM CHLORIDE CRYS ER 20 MEQ PO TBCR
40.0000 meq | EXTENDED_RELEASE_TABLET | Freq: Once | ORAL | Status: AC
  Administered 2015-12-14: 40 meq via ORAL
  Filled 2015-12-14: qty 2

## 2015-12-14 MED ORDER — THIAMINE HCL 100 MG/ML IJ SOLN
100.0000 mg | Freq: Once | INTRAMUSCULAR | Status: AC
  Administered 2015-12-14: 100 mg via INTRAVENOUS
  Filled 2015-12-14: qty 2

## 2015-12-14 NOTE — ED Provider Notes (Addendum)
[facility] DEPT Provider Note   CSN: [id number] Arrival date & time: 12/14/15  [id number]     History   Chief Complaint Chief Complaint  Patient presents with  . Abdominal Pain  . Shortness of Breath    HPI Melissa Ball is a 65 y.o. female.  HPI Complains of vomiting and nausea for the past 3 days accompanied by intermittent diffuse abdominal pain and intermittent dyspnea. Other associated symptoms include generalized weakness. She denies fever. She treated herself with Ativan, without relief of nausea. She is presently hungry. Denies nausea presently she is asymptomatic presently except for generalized weakness generalized weakness worse with standing and improved with remaining still. No other associated symptoms Past Medical History:  Diagnosis Date  . Arthritis   . GERD (gastroesophageal reflux disease)   . History of transfusion   . Hyperlipidemia   . Hypertension   . Ovarian cancer ([place])   Metastatic ovarian cancer. Chemotherapy recently stopped as patient learned that cancer had spread to liver  Patient Active Problem List   Diagnosis Date Noted  . Cancer associated pain 12/13/2015  . DNR (do not resuscitate) 11/28/2015  . Other secondary hypertension 06/02/2015  . Genetic testing 03/23/2015  . 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 Metastasis to liver ([place]) 03/03/2015  . Anemia due to antineoplastic chemotherapy 02/04/2015  . Family history of breast cancer in sister 01/15/2015  . Family history of colon cancer in mother 01/15/2015  . Rash 12/23/2014  . Peripheral edema 12/23/2014  . Hand foot syndrome 12/23/2014  . Portacath in place 09/27/2014  . High risk medication use 09/27/2014  . Obesity, morbid, BMI 40.0-49.9 ([place]) 06/27/2014  . Port catheter in place 06/27/2014  . 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 Antineoplastic chemotherapy induced anemia  06/27/2014  . S/P total abdominal hysterectomy and bilateral salpingo-oophorectomy 01/23/2014  . Ovarian cancer ([place]) 01/21/2014  . Osteoarthritis, shoulder 08/29/2013  . Preventative health care 08/14/2013  . GERD (gastroesophageal reflux disease) 08/14/2013  . Right shoulder pain 08/14/2013  . Unspecified constipation 08/14/2013  . Hyperlipidemia 05/28/2012  . Hypertension 02/21/2012    Past Surgical History:  Procedure Laterality Date  . JOINT REPLACEMENT  2012   L KNEE  . PORTACATH PLACEMENT    . ROBOTIC ASSISTED TOTAL HYSTERECTOMY WITH BILATERAL SALPINGO OOPHERECTOMY N/A 01/21/2014   Procedure: ROBOTIC ASSISTED TOTAL HYSTERECTOMY WITH BILATERAL SALPINGO OOPHORECTOMY with omentectomy;  Surgeon: Everitt Amber, MD;  Location: [facility]S;  Service: Gynecology;  Laterality: N/A;    OB History    No data available       Home Medications    Prior to Admission medications   Medication Sig Start Date End Date Taking? Authorizing Provider  acetaminophen (TYLENOL) 500 MG tablet Take 500 mg by mouth every 6 (six) hours as needed. Reported on 09/03/2015    Historical Provider, MD  HYDROcodone-acetaminophen (NORCO/VICODIN) 5-325 MG tablet Take 1 tablet every 4-6 hours as needed for pain. 12/10/15   Lennis Marion Downer, MD  lidocaine-prilocaine (EMLA) cream Apply 1 application topically See admin instructions. Apply to Porta-Cath 1-2 hours prior to access as directed.    Historical Provider, MD  LORazepam (ATIVAN) 1 MG tablet Take 1 tablet by mouth or place under tongue to dissolve every 6 hours as needed for nausea or to relax. Will make you drowsy. 12/10/15   Lennis Marion Downer, MD  Multiple Vitamin (MULTIVITAMIN) capsule Take 1 capsule by mouth every other day.  Historical Provider, MD  OVER THE COUNTER MEDICATION Women's Gentle Laxitive as needed    Historical Provider, MD  pantoprazole (PROTONIX) 40 MG tablet TAKE 1 TABLET (40 MG TOTAL) BY MOUTH DAILY. 10/28/15   Biagio Borg, MD  polyethylene  glycol powder [facility]) powder Take 1 Container by mouth daily as needed. Reported on 09/03/2015    Historical Provider, MD  simethicone (GAS-X EXTRA STRENGTH) 125 MG chewable tablet Chew 125 mg by mouth every 6 (six) hours as needed for flatulence.    Historical Provider, MD    Family History Family History  Problem Relation Age of Onset  . Hypertension Mother   . Colon cancer Mother 13  . Hypertension Father   . Heart disease Father   . Breast cancer Sister     dx. younger than 43; metastatic  . Alcohol abuse Brother   . Lung cancer Brother 14    smoker  . Lung cancer Brother     dx. 73s; smoker  . Multiple sclerosis Grandchild 17  . Cancer Cousin     dx. 43s; unspecified type  . Breast cancer Cousin     dx. 39s  . Cancer Cousin     (x2 1st cousins); dx. later ages; unspecified type  . Thyroid cancer Other 40  . Cancer Cousin     dx. late 9s; unspecified type    Social History Social History  Substance Use Topics  . Smoking status: Current Some Day Smoker    Years: 20.00    Types: Cigarettes  . Smokeless tobacco: Never Used     Comment: few cigs here and there  . Alcohol use 0.0 oz/week     Comment: RARE     Allergies   Review of patient's allergies indicates no known allergies.   Review of Systems Review of Systems  HENT: Negative.   Respiratory: Positive for shortness of breath.   Cardiovascular: Negative.   Gastrointestinal: Positive for nausea and vomiting.  Musculoskeletal: Negative.   Skin: Negative.   Allergic/Immunologic: Positive for immunocompromised state.  Neurological: Positive for weakness.  Psychiatric/Behavioral: Negative.   All other systems reviewed and are negative.    Physical Exam Updated Vital Signs BP 122/84 (BP Location: Right Arm)   Pulse (!) 132   Temp 97.9 F (36.6 C) (Oral)   Resp 16   SpO2 100%   Physical Exam  Constitutional: She appears well-developed and well-nourished. No distress.  HENT:  Head: Normocephalic  and atraumatic.  Mucous membranes dry  Eyes: Conjunctivae are normal. Pupils are equal, round, and reactive to light.  Neck: Neck supple. No tracheal deviation present. No thyromegaly present.  Cardiovascular: Regular rhythm.   No murmur heard. Tachycardic  Pulmonary/Chest: Effort normal and breath sounds normal.  Abdominal: Soft. Bowel sounds are normal. She exhibits no distension. There is no tenderness.  Obese  Musculoskeletal: Normal range of motion. She exhibits edema. She exhibits no tenderness.  Trace pretibial pitting edema bilaterally  Neurological: She is alert. Coordination normal.  Skin: Skin is warm and dry. No rash noted.  Psychiatric: She has a normal mood and affect.  Nursing note and vitals reviewed.    ED Treatments / Results  Labs (all labs ordered are listed, but only abnormal results are displayed) Labs Reviewed  LIPASE, BLOOD  COMPREHENSIVE METABOLIC PANEL  CBC  URINALYSIS, ROUTINE W REFLEX MICROSCOPIC (NOT AT [facility])    EKG  EKG Interpretation None      ED ECG REPORT   Date: 12/14/2015  Rate:  130  Rhythm: sinus tachycardia  QRS Axis: normal  Intervals: normal  ST/T Wave abnormalities: nonspecific T wave changes  Conduction Disutrbances:none  Narrative Interpretation:   Old EKG Reviewed: unchanged No change from 01/22/2014 I have personally reviewed the EKG tracing and agree with the computerized printout as noted. Radiology No results found. X-rays viewed by me Procedures Procedures (including critical care time) Results for orders placed or performed during the hospital encounter of 12/14/15  Lipase, blood  Result Value Ref Range   Lipase 15 11 - 51 U/L  Comprehensive metabolic panel  Result Value Ref Range   Sodium 137 135 - 145 mmol/L   Potassium 3.4 (L) 3.5 - 5.1 mmol/L   Chloride 100 (L) 101 - 111 mmol/L   CO2 26 22 - 32 mmol/L   Glucose, Bld 92 65 - 99 mg/dL   BUN 11 6 - 20 mg/dL   Creatinine, Ser 0.77 0.44 - 1.00 mg/dL    Calcium 9.2 8.9 - 10.3 mg/dL   Total Protein 7.7 6.5 - 8.1 g/dL   Albumin 2.6 (L) 3.5 - 5.0 g/dL   AST 49 (H) 15 - 41 U/L   ALT 19 14 - 54 U/L   Alkaline Phosphatase 149 (H) 38 - 126 U/L   Total Bilirubin 0.6 0.3 - 1.2 mg/dL   GFR calc non Af Amer >60 >60 mL/min   GFR calc Af Amer >60 >60 mL/min   Anion gap 11 5 - 15  CBC  Result Value Ref Range   WBC 17.4 (H) 4.0 - 10.5 K/uL   RBC 3.50 (L) 3.87 - 5.11 MIL/uL   Hemoglobin 8.9 (L) 12.0 - 15.0 g/dL   HCT 27.6 (L) 36.0 - 46.0 %   MCV 78.9 78.0 - 100.0 fL   MCH 25.4 (L) 26.0 - 34.0 pg   MCHC 32.2 30.0 - 36.0 g/dL   RDW 17.9 (H) 11.5 - 15.5 %   Platelets 533 (H) 150 - 400 K/uL  Urinalysis, Routine w reflex microscopic  Result Value Ref Range   Color, Urine YELLOW YELLOW   APPearance CLOUDY (A) CLEAR   Specific Gravity, Urine 1.019 1.005 - 1.030   pH 6.0 5.0 - 8.0   Glucose, UA NEGATIVE NEGATIVE mg/dL   Hgb urine dipstick NEGATIVE NEGATIVE   Bilirubin Urine NEGATIVE NEGATIVE   Ketones, ur NEGATIVE NEGATIVE mg/dL   Protein, ur 30 (A) NEGATIVE mg/dL   Nitrite NEGATIVE NEGATIVE   Leukocytes, UA NEGATIVE NEGATIVE  Urine microscopic-add on  Result Value Ref Range   Squamous Epithelial / LPF 0-5 (A) NONE SEEN   WBC, UA 0-5 0 - 5 WBC/hpf   RBC / HPF 0-5 0 - 5 RBC/hpf   Bacteria, UA RARE (A) NONE SEEN   Ct Abdomen Pelvis W Wo Contrast  Result Date: 11/26/2015 CLINICAL DATA:  Ovarian carcinoma with liver metastasis. Peritoneal metastasis. Ongoing chemotherapy. Nausea and vomiting for 2-3 weeks. Rising CA 125 EXAM: CT ABDOMEN AND PELVIS WITHOUT AND WITH CONTRAST TECHNIQUE: Multidetector CT imaging of the abdomen and pelvis was performed following the standard protocol before and following the bolus administration of intravenous contrast. CONTRAST:  129mL ISOVUE-300 IOPAMIDOL (ISOVUE-300) INJECTION 61% COMPARISON:  CT 09/02/2015 FINDINGS: Lower chest: Within the RIGHT lower lobe just above the diaphragm 5 mm round nodule (image 10, series  7) is new from prior. In the posterior LEFT lower lobe 8 mm nodule adjacent to the pleural surfaces increased from 6 mm (image 37 series 7) Hepatobiliary: Marked progression of hepatic metastasis.  Significantly enlarged lesions in LEFT and RIGHT hepatic lobe. Example lesion subcapsular LEFT hepatic lobe measures 6.8 cm increased from 1.5 cm. Lesion in the lateral LEFT hepatic lobe measures 6.0 cm increased from 3.2 cm. Lesion in the inferior RIGHT hepatic lobe measures 4.4 cm increased from 0.9 cm. Pancreas: Pancreas is normal. No ductal dilatation. No pancreatic inflammation. Spleen: Normal spleen Adrenals/urinary tract: RIGHT adrenal adenoma unchanged. Kidneys normal. Stomach/Bowel: Large hiatal hernia. The stomach, duodenum and small bowel are normal. No evidence of bowel obstruction. Colon and rectosigmoid colon normal without evidence obstruction. Vascular/Lymphatic: Abdominal aorta is normal caliber. New retroperitoneal periaortic lymphadenopathy. Example lymph node measures 18 mm positioned between the aorta and IVC increased from 6 mm. Increase in LEFT external iliac lymphadenopathy. Example lymph node measures 15 mm short axis (image 121 series 5). Adjacent 16 mm LEFT obturator node. Reproductive: Post hysterectomy.  Ovaries not identified Other: Dominant finding is massive enlargement peritoneal metastasis in the LEFT upper quadrant. Lesion measures 13 cm x 8.5 cm (image 157, series 5) increased from 4.3 x 3.7 cm. Peritoneal nodule in the deep pelvis measuring 2.8 cm increased from 1.2 cm (image 111, series 5. Musculoskeletal: Stable sclerotic lesion in the RIGHT iliac bone. IMPRESSION: 1. Marked progression of hepatic metastasis. 2. Marked progression of peritoneal metastasis now with a 13 cm LEFT upper quadrant peritoneal mass. 3. Increase in retroperitoneal and LEFT iliac lymphadenopathy. 4. Two small pulmonary nodules are new or increased from comparison exam and concerning for early pulmonary  metastasis . These results will be called to the ordering clinician or representative by the Radiologist Assistant, and communication documented in the PACS or zVision Dashboard. Electronically Signed   By: Suzy Bouchard M.D.   On: 11/26/2015 11:44   Dg Chest 2 View  Result Date: 11/23/2015 CLINICAL DATA:  Metastatic ovarian cancer. Lower abdominal pain and vomiting for 2 days. EXAM: CHEST  2 VIEW COMPARISON:  01/17/2014 FINDINGS: right Port-A-Cath remains in place with tip in the SVC. Bibasilar linear densities, which could reflect scarring or atelectasis. Heart is normal size. No effusions. No acute bony abnormality. IMPRESSION: Linear densities in the lung bases, scarring versus atelectasis. Electronically Signed   By: Rolm Baptise M.D.   On: 11/23/2015 13:34   Dg Abd 2 Views  Result Date: 11/23/2015 CLINICAL DATA:  Metastatic ovarian cancer. Lower abdominal pain and vomiting for 2 days. EXAM: ABDOMEN - 2 VIEW COMPARISON:  CT 09/02/2015 FINDINGS: Nonobstructive bowel gas pattern. No free air organomegaly. No suspicious calcification. Degenerative changes in the lumbar spine and hips. Linear densities in the lung bases, likely atelectasis. IMPRESSION: No evidence of bowel obstruction or free air. Bibasilar atelectasis. Electronically Signed   By: Rolm Baptise M.D.   On: 11/23/2015 13:32   Dg Abd Acute W/chest  Result Date: 12/14/2015 CLINICAL DATA:  Nausea vomiting and mid abdominal pain since yesterday. Ovarian cancer. EXAM: DG ABDOMEN ACUTE W/ 1V CHEST COMPARISON:  CT scan 11/26/2015.  X-rays from 11/23/2015. FINDINGS: No edema or focal airspace consolidation. Probable tiny bilateral pleural effusions noted. Cardiopericardial silhouette is at upper limits of normal for size. Right Port-A-Cath remains in place with tip overlying the mid SVC level. Upright film shows no evidence for intraperitoneal free air. Supine film shows no gaseous bowel dilatation suggest obstruction with relatively gasless  small bowel. Degenerative changes noted in lumbar spine and bilateral hips, left greater than right. IMPRESSION: 1. Tiny bilateral pleural effusions. 2. No evidence for intraperitoneal free air or bowel obstruction. Electronically Signed  By: Misty Stanley M.D.   On: 12/14/2015 10:32   Medications Ordered in ED Medications  albuterol (PROVENTIL) (2.5 MG/3ML) 0.083% nebulizer solution 5 mg (not administered)     Initial Impression / Assessment and Plan / ED Course  I have reviewed the triage vital signs and the nursing notes.  Pertinent labs & imaging results that were available during my care of the patient were reviewed by me and considered in my medical decision making (see chart for details).  Clinical Course   Anemia is chronic, unchanged from 2 months ago. Patient vomited once in the emergency department. IV Zofran ordered. She is asymptomatic after treatment with intravenous hydration and intravenous Zofran. She ate a meal in the emergency department. At 1:15 PM she is asymptomatic. [place] toward. Not lightheaded on standing Patient with resting tachycardia however had resting tachycardic at last visit. From review of notes she wishes only symptomatic care. Case discussed with Dr.Magrinat plan intravenous hydration. Prescription Zofran. Follow-up with Dr.Livesay Final Clinical Impressions(s) / ED Diagnoses  Diagnosis #1dehydration #2 hypokalemia #3 anemia Final diagnoses:  None    New Prescriptions New Prescriptions   No medications on file     Orlie Dakin, MD 12/14/15 1322 Addendum patient vomited after being given discharge instructions. She was given Zofran 8 mg ODT. At 2:20 PM nausea has resolved. She is able to drink without difficulty she feels ready for discharge   Orlie Dakin, MD 12/14/15 1436

## 2015-12-14 NOTE — ED Notes (Signed)
Pt transported to DG.  

## 2015-12-14 NOTE — Discharge Instructions (Signed)
Drink at least six 8 ounce glasses of water or Gatorade each day. Take the medication prescribed as needed for nausea. Call Dr.Liversay today to schedule follow-up appointment. Return if feeling worse, you he can't hold down fluids without vomiting after taking the medication prescribed, or if concern for any reason.

## 2015-12-14 NOTE — ED Notes (Signed)
Pt reports nausea followed by one episode of emesis. Pt denies nausea at present time. Per [employer] given zofran to prevent future nausea until pt able to fill prescription AND continue to monitor at present time.

## 2015-12-14 NOTE — ED Triage Notes (Signed)
Pt complains of generalized body vomiting. Pt was seen for same earlier today but states she is not feeling better. Pt states she has vomited 3 times since she left, states she took zofran at [facility] today.

## 2015-12-14 NOTE — ED Triage Notes (Addendum)
Pt c/o generalized pain and vomiting since yesterday. Pt unable to keep anything down. Pt denies diarrhea but sts "When I go it's loose." Pt received a blood transfusion two weeks ago. Denies chest pain but c/o SOB. A&Ox4. Pt tachy at 132 in triage. Pt has hx of anemia. Pt has hx fo ovarian cancer but doesn't seem to want to discuss it with family in the room.

## 2015-12-14 NOTE — ED Notes (Signed)
Jacubowitz at bedside at present time AND verbalizes to continue to discharge at present time.

## 2015-12-14 NOTE — ED Notes (Signed)
Pt continues to deny nausea.

## 2015-12-15 ENCOUNTER — Emergency Department (HOSPITAL_COMMUNITY): Payer: Medicare Other

## 2015-12-15 DIAGNOSIS — R1110 Vomiting, unspecified: Secondary | ICD-10-CM | POA: Diagnosis present

## 2015-12-15 DIAGNOSIS — C562 Malignant neoplasm of left ovary: Secondary | ICD-10-CM | POA: Diagnosis not present

## 2015-12-15 DIAGNOSIS — R110 Nausea: Secondary | ICD-10-CM | POA: Diagnosis not present

## 2015-12-15 DIAGNOSIS — G43A1 Cyclical vomiting, intractable: Secondary | ICD-10-CM | POA: Diagnosis not present

## 2015-12-15 LAB — COMPREHENSIVE METABOLIC PANEL
ALT: 17 U/L (ref 14–54)
AST: 42 U/L — ABNORMAL HIGH (ref 15–41)
Albumin: 2.3 g/dL — ABNORMAL LOW (ref 3.5–5.0)
Alkaline Phosphatase: 126 U/L (ref 38–126)
Anion gap: 11 (ref 5–15)
BUN: 14 mg/dL (ref 6–20)
CO2: 28 mmol/L (ref 22–32)
Calcium: 8.5 mg/dL — ABNORMAL LOW (ref 8.9–10.3)
Chloride: 101 mmol/L (ref 101–111)
Creatinine, Ser: 0.88 mg/dL (ref 0.44–1.00)
GFR calc Af Amer: >60 mL/min (ref >60)
GFR calc non Af Amer: >60 mL/min (ref >60)
Glucose, Bld: 94 mg/dL (ref 65–99)
Potassium: 3.1 mmol/L — ABNORMAL LOW (ref 3.5–5.1)
Sodium: 140 mmol/L (ref 135–145)
Total Bilirubin: 0.6 mg/dL (ref 0.3–1.2)
Total Protein: 7 g/dL (ref 6.5–8.1)

## 2015-12-15 LAB — CBC WITH DIFFERENTIAL/PLATELET
Basophils Absolute: 0 10*3/uL (ref 0.0–0.1)
Basophils Relative: 0 %
Eosinophils Absolute: 0 10*3/uL (ref 0.0–0.7)
Eosinophils Relative: 0 %
HCT: 27.3 % — ABNORMAL LOW (ref 36.0–46.0)
Hemoglobin: 8.8 g/dL — ABNORMAL LOW (ref 12.0–15.0)
Lymphocytes Relative: 7 %
Lymphs Abs: 1 10*3/uL (ref 0.7–4.0)
MCH: 24.9 pg — ABNORMAL LOW (ref 26.0–34.0)
MCHC: 32.2 g/dL (ref 30.0–36.0)
MCV: 77.1 fL — ABNORMAL LOW (ref 78.0–100.0)
Monocytes Absolute: 1 10*3/uL (ref 0.1–1.0)
Monocytes Relative: 7 %
Neutro Abs: 12.5 10*3/uL — ABNORMAL HIGH (ref 1.7–7.7)
Neutrophils Relative %: 86 %
Platelets: 419 10*3/uL — ABNORMAL HIGH (ref 150–400)
RBC: 3.54 MIL/uL — ABNORMAL LOW (ref 3.87–5.11)
RDW: 17.6 % — ABNORMAL HIGH (ref 11.5–15.5)
WBC: 14.5 10*3/uL — ABNORMAL HIGH (ref 4.0–10.5)
nRBC: 2 /100 WBC — ABNORMAL HIGH

## 2015-12-15 MED ORDER — SODIUM CHLORIDE 0.9 % IV SOLN
25.0000 mg | Freq: Once | INTRAVENOUS | Status: AC
  Administered 2015-12-15: 25 mg via INTRAVENOUS
  Filled 2015-12-15: qty 1

## 2015-12-15 MED ORDER — ONDANSETRON HCL 4 MG/2ML IJ SOLN
4.0000 mg | Freq: Once | INTRAMUSCULAR | Status: AC
  Administered 2015-12-15: 4 mg via INTRAVENOUS
  Filled 2015-12-15: qty 2

## 2015-12-15 MED ORDER — ONDANSETRON 4 MG PO TBDP
4.0000 mg | ORAL_TABLET | Freq: Once | ORAL | Status: AC | PRN
  Administered 2015-12-15: 4 mg via ORAL
  Filled 2015-12-15: qty 1

## 2015-12-15 MED ORDER — POTASSIUM CHLORIDE 10 MEQ/100ML IV SOLN
10.0000 meq | Freq: Once | INTRAVENOUS | Status: AC
  Administered 2015-12-15: 10 meq via INTRAVENOUS
  Filled 2015-12-15: qty 100

## 2015-12-15 MED ORDER — ONDANSETRON 4 MG PO TBDP: 4.0000 mg | ORAL_TABLET | Freq: Four times a day (QID) | ORAL | Status: DC | PRN

## 2015-12-15 MED ORDER — LIDOCAINE-PRILOCAINE 2.5-2.5 % EX CREA
1.0000 "application " | TOPICAL_CREAM | CUTANEOUS | Status: DC | PRN
  Filled 2015-12-15: qty 5

## 2015-12-15 MED ORDER — SUCRALFATE 1 G PO TABS
1.0000 g | ORAL_TABLET | Freq: Three times a day (TID) | ORAL | Status: DC
  Administered 2015-12-15 – 2015-12-16 (×4): 1 g via ORAL
  Filled 2015-12-15 (×4): qty 1

## 2015-12-15 MED ORDER — ACETAMINOPHEN 500 MG PO TABS: 500.0000 mg | ORAL_TABLET | Freq: Four times a day (QID) | ORAL | Status: DC | PRN

## 2015-12-15 MED ORDER — GI COCKTAIL ~~LOC~~
30.0000 mL | Freq: Once | ORAL | Status: AC
  Administered 2015-12-15: 30 mL via ORAL
  Filled 2015-12-15: qty 30

## 2015-12-15 MED ORDER — ONDANSETRON HCL 4 MG/2ML IJ SOLN
4.0000 mg | Freq: Three times a day (TID) | INTRAMUSCULAR | Status: AC | PRN
  Administered 2015-12-15: 4 mg via INTRAVENOUS
  Filled 2015-12-15: qty 2

## 2015-12-15 MED ORDER — ENOXAPARIN SODIUM 40 MG/0.4ML ~~LOC~~ SOLN
40.0000 mg | SUBCUTANEOUS | Status: DC
  Administered 2015-12-15 – 2015-12-16 (×2): 40 mg via SUBCUTANEOUS
  Filled 2015-12-15 (×2): qty 0.4

## 2015-12-15 MED ORDER — SODIUM CHLORIDE 0.9 % IV SOLN
INTRAVENOUS | Status: AC
  Administered 2015-12-15: 06:00:00 via INTRAVENOUS

## 2015-12-15 MED ORDER — SIMETHICONE 80 MG PO CHEW
80.0000 mg | CHEWABLE_TABLET | Freq: Four times a day (QID) | ORAL | Status: DC | PRN
  Administered 2015-12-15: 80 mg via ORAL
  Filled 2015-12-15: qty 1

## 2015-12-15 MED ORDER — SODIUM CHLORIDE 0.9% FLUSH
10.0000 mL | INTRAVENOUS | Status: DC | PRN
  Administered 2015-12-16: 10 mL
  Filled 2015-12-15: qty 40

## 2015-12-15 MED ORDER — POTASSIUM CHLORIDE 10 MEQ/100ML IV SOLN
10.0000 meq | INTRAVENOUS | Status: AC
  Administered 2015-12-15 (×4): 10 meq via INTRAVENOUS
  Filled 2015-12-15 (×4): qty 100

## 2015-12-15 MED ORDER — IOPAMIDOL (ISOVUE-300) INJECTION 61%
100.0000 mL | Freq: Once | INTRAVENOUS | Status: AC | PRN
  Administered 2015-12-15: 100 mL via INTRAVENOUS

## 2015-12-15 MED ORDER — HYDROCODONE-ACETAMINOPHEN 5-325 MG PO TABS
1.0000 | ORAL_TABLET | ORAL | Status: DC | PRN
  Administered 2015-12-16: 1 via ORAL
  Filled 2015-12-15: qty 1

## 2015-12-15 MED ORDER — LORAZEPAM 1 MG PO TABS
1.0000 mg | ORAL_TABLET | Freq: Four times a day (QID) | ORAL | Status: DC | PRN
  Administered 2015-12-15 (×2): 1 mg via ORAL
  Filled 2015-12-15 (×2): qty 1

## 2015-12-15 MED ORDER — INFLUENZA VAC SPLIT QUAD 0.5 ML IM SUSY
0.5000 mL | PREFILLED_SYRINGE | INTRAMUSCULAR | Status: DC
  Filled 2015-12-15 (×2): qty 0.5

## 2015-12-15 MED ORDER — ADULT MULTIVITAMIN W/MINERALS CH
1.0000 | ORAL_TABLET | ORAL | Status: DC
  Administered 2015-12-15: 1 via ORAL
  Filled 2015-12-15: qty 1

## 2015-12-15 MED ORDER — SODIUM CHLORIDE 0.9 % IV BOLUS (SEPSIS)
1000.0000 mL | Freq: Once | INTRAVENOUS | Status: AC
  Administered 2015-12-15: 1000 mL via INTRAVENOUS

## 2015-12-15 MED ORDER — BACLOFEN 10 MG PO TABS
5.0000 mg | ORAL_TABLET | Freq: Three times a day (TID) | ORAL | Status: DC | PRN
Start: 2015-12-15 — End: 2015-12-16
  Administered 2015-12-15: 5 mg via ORAL
  Filled 2015-12-15: qty 1

## 2015-12-15 MED ORDER — POLYETHYLENE GLYCOL 3350 17 G PO PACK: 17.0000 g | PACK | Freq: Every day | ORAL | Status: DC | PRN

## 2015-12-15 MED ORDER — MULTIVITAMINS PO CAPS: 1.0000 | ORAL_CAPSULE | ORAL | Status: DC

## 2015-12-15 MED ORDER — PANTOPRAZOLE SODIUM 40 MG PO TBEC
40.0000 mg | DELAYED_RELEASE_TABLET | Freq: Every day | ORAL | Status: DC
  Administered 2015-12-15 – 2015-12-16 (×2): 40 mg via ORAL
  Filled 2015-12-15 (×2): qty 1

## 2015-12-15 MED ORDER — POLYETHYLENE GLYCOL 3350 17 GM/SCOOP PO POWD: 17.0000 g | Freq: Every day | ORAL | Status: DC | PRN

## 2015-12-15 NOTE — ED Provider Notes (Signed)
[facility] DEPT Provider Note   CSN: [id number] Arrival date & time: 12/14/15  2204  By signing my name below, I, Ephriam Jenkins, attest that this documentation has been prepared under the direction and in the presence of [employer].  Electronically Signed: Ephriam Jenkins, ED Scribe. 12/15/15. 12:44 AM.   History   Chief Complaint Chief Complaint  Patient presents with  . Emesis  . Generalized Body Aches    HPI HPI Comments: KEMYRA WESTERKAMP is a 65 y.o. 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 Pt was seen for the same thing earlier today but states that she is not feeling better. Pt has vomited 3 times since she was discharged today. She also complains of a sore throat currently which she believes is due to persistent dry heaving. She states that she is unable to keep any food down without vomiting. Dr. Marko Plume is her PCP and is no longer receiving treatments for her ovarian cancer because they were not working. Pt has had small loose bowel movements. Patient denies any chest pain or shortness of breath.     Past Medical History:  Diagnosis Date  . Arthritis   . GERD (gastroesophageal reflux disease)   . History of transfusion   . Hyperlipidemia   . Hypertension   . Ovarian cancer [facility])     Patient Active Problem List   Diagnosis Date Noted  . Intractable vomiting 12/15/2015  . Cancer associated pain 12/13/2015  . DNR (do not resuscitate) 11/28/2015  . Other secondary hypertension 06/02/2015  . Genetic testing 03/23/2015  . [employer]ne[employer] Obst[employer] (FIGO) stage IVB epithelial ovarian cancer ([place]) 03/13/2015  . Metastasis to liver ([place]) 03/03/2015  . Anemia due to antineoplastic chemotherapy 02/04/2015  . Family history of breast cancer in sister 01/15/2015  . Family  history of colon cancer in mother 01/15/2015  . Rash 12/23/2014  . Peripheral edema 12/23/2014  . Hand foot syndrome 12/23/2014  . Portacath in place 09/27/2014  . High risk medication use 09/27/2014  . Obesity, morbid, BMI 40.0-49.9 ([place]) 06/27/2014  . Port catheter in place 06/27/2014  . Peripheral neuropathy due to chemotherapy ([place]) 06/27/2014  . Antineoplastic chemotherapy induced anemia 06/27/2014  . S/P total abdominal hysterectomy and bilateral salpingo-oophorectomy 01/23/2014  . Ovarian cancer ([place]) 01/21/2014  . Osteoarthritis, shoulder 08/29/2013  . Preventative health care 08/14/2013  . GERD (gastroesophageal reflux disease) 08/14/2013  . Right shoulder pain 08/14/2013  . Unspecified constipation 08/14/2013  . Hyperlipidemia 05/28/2012  . Hypertension 02/21/2012    Past Surgical History:  Procedure Laterality Date  . JOINT REPLACEMENT  2012   L KNEE  . PORTACATH PLACEMENT    . ROBOTIC ASSISTED TOTAL HYSTERECTOMY WITH BILATERAL SALPINGO OOPHERECTOMY N/A 01/21/2014   Procedure: ROBOTIC ASSISTED TOTAL HYSTERECTOMY WITH BILATERAL SALPINGO OOPHORECTOMY with omentectomy;  Surgeon: Everitt Amber, MD;  Location: [facility] ORS;  Service: Gynecology;  Laterality: N/A;    OB History    No data available       Home Medications    Prior to Admission medications   Medication Sig Start Date End Date Taking? Authorizing Provider  acetaminophen (TYLENOL) 500 MG tablet Take 500 mg by mouth every 6 (six) hours as needed for mild pain. Reported on 09/03/2015   Yes Historical Provider, MD  HYDROcodone-acetaminophen (NORCO/VICODIN) 5-325 MG tablet Take 1 tablet every  4-6 hours as needed for pain. Patient taking differently: Take 1 tablet by mouth every 4 (four) hours as needed (for pain.).  12/10/15  Yes Lennis Marion Downer, MD  lidocaine-prilocaine (EMLA) cream Apply 1 application topically See admin instructions. Apply to Porta-Cath 1-2 hours prior to access as directed.   Yes Historical Provider,  MD  LORazepam (ATIVAN) 1 MG tablet Take 1 tablet by mouth or place under tongue to dissolve every 6 hours as needed for nausea or to relax. Will make you drowsy. Patient taking differently: Take 1 mg by mouth every 6 (six) hours as needed (for nausea/to help relax. (MAY DISSOLVE UNDER TONGUE--MEDICATION WILL MAKE YOU DROWSY)).  12/10/15  Yes Lennis Marion Downer, MD  Multiple Vitamin (MULTIVITAMIN) capsule Take 1 capsule by mouth every other day. One-a-Day Proactive 65+   Yes Historical Provider, MD  ondansetron (ZOFRAN ODT) 8 MG disintegrating tablet 8mg  ODT q4 hours prn nausea Patient taking differently: Take 8 mg by mouth every 4 (four) hours as needed for nausea.  12/14/15  Yes Sam Winfred Leeds, MD  pantoprazole (PROTONIX) 40 MG tablet TAKE 1 TABLET (40 MG TOTAL) BY MOUTH DAILY. 10/28/15  Yes Biagio Borg, MD  polyethylene glycol powder [facility]) powder Take 17 g by mouth daily as needed for mild constipation. Reported on 09/03/2015   Yes Historical Provider, MD  simethicone (GAS-X EXTRA STRENGTH) 125 MG chewable tablet Chew 125 mg by mouth every 6 (six) hours as needed for flatulence.   Yes Historical Provider, MD    Family History Family History  Problem Relation Age of Onset  . Hypertension Mother   . Colon cancer Mother 24  . Hypertension Father   . Heart disease Father   . Breast cancer Sister     dx. younger than 15; metastatic  . Alcohol abuse Brother   . Lung cancer Brother 46    smoker  . Lung cancer Brother     dx. 46s; smoker  . Multiple sclerosis Grandchild 17  . Cancer Cousin     dx. 12s; unspecified type  . Breast cancer Cousin     dx. 2s  . Cancer Cousin     (x2 1st cousins); dx. later ages; unspecified type  . Thyroid cancer Other 40  . Cancer Cousin     dx. late 21s; unspecified type    Social History Social History  Substance Use Topics  . Smoking status: Current Some Day Smoker    Years: 20.00    Types: Cigarettes  . Smokeless tobacco: Never Used     Comment:  few cigs here and there  . Alcohol use 0.0 oz/week     Comment: RARE    Allergies   Review of patient's allergies indicates no known allergies.   Review of Systems Review of Systems  Constitutional: Positive for chills.  Gastrointestinal: Positive for abdominal pain, diarrhea, nausea and vomiting.  All other systems reviewed and are negative.   Physical Exam Updated Vital Signs BP (!) 145/108 (BP Location: Left Arm)   Pulse (!) 136   Temp 98.3 F (36.8 C) (Oral)   Resp 18   SpO2 96%     Physical Exam  Constitutional: She is oriented to person, place, and time. She appears well-developed and well-nourished. No distress.  HENT:  Head: Normocephalic and atraumatic.  Neck: Normal range of motion.  Pulmonary/Chest: Effort normal.  Abdominal: Soft. She exhibits no distension. There is no tenderness.  Neurological: She is alert and oriented to person, place, and time.  Skin: Skin is warm and dry. She is not diaphoretic.  Psychiatric: She has a normal mood and affect. Judgment normal.  Nursing note and vitals reviewed.    ED Treatments / Results  DIAGNOSTIC STUDIES: Oxygen Saturation is 96% on RA, normal by my interpretation.  COORDINATION OF CARE: 12:43 AM-Will review past medical records. Discussed treatment plan with pt at bedside and pt agreed to plan.   Labs (all labs ordered are listed, but only abnormal results are displayed) Labs Reviewed  CBC WITH DIFFERENTIAL/PLATELET - Abnormal; Notable for the following:       Result Value   WBC 14.5 (*)    RBC 3.54 (*)    Hemoglobin 8.8 (*)    HCT 27.3 (*)    MCV 77.1 (*)    MCH 24.9 (*)    RDW 17.6 (*)    Platelets 419 (*)    nRBC 2 (*)    Neutro Abs 12.5 (*)    All other components within normal limits  COMPREHENSIVE METABOLIC PANEL - Abnormal; Notable for the following:    Potassium 3.1 (*)    Calcium 8.5 (*)    Albumin 2.3 (*)    AST 42 (*)    All other components within normal limits    EKG  EKG  Interpretation None       Radiology Ct Abdomen Pelvis W Contrast  Result Date: 12/15/2015 CLINICAL DATA:  Abdominal pain and vomiting. Patient with history of ovarian cancer and liver metastasis. EXAM: CT ABDOMEN AND PELVIS WITH CONTRAST TECHNIQUE: Multidetector CT imaging of the abdomen and pelvis was performed using the standard protocol following bolus administration of intravenous contrast. CONTRAST:  137mL ISOVUE-300 IOPAMIDOL (ISOVUE-300) INJECTION 61% COMPARISON:  CT 11/26/2015 FINDINGS: Lower chest: Right middle lobe nodule measures 5 mm. This is unchanged. Additional small nodule in the right middle lobe, unchanged. Chronic right middle and lower left lower lobe atelectasis. Pleural-based nodule measures 8 mm. Tiny left pleural effusion. Small pericardial nodes are unchanged. Small retrocrural periaortic nodes have increased. Hepatobiliary: Multifocal hepatic metastatic disease. Index lesion in the subcapsular right lobe measures 7.7 x 5.0 cm, previously 5.7 x 4.2 cm by my retrospective measurement. Left lobe lesion measures 6.7 cm, previously 6.8, however differences likely secondary to caliper placement. Additional multifocal lesions have also increased in size. Gallbladder decompressed, no calcified stone. Pancreas: No ductal dilatation or inflammation. Mild atrophic parenchyma. Spleen: Normal in size. Adrenals/Urinary Tract: Right adrenal nodule is unchanged. Left adrenal gland normal. Symmetric renal enhancement and excretion without hydronephrosis. Bladder is minimally distended. Stomach/Bowel: Moderate hiatal hernia. No bowel obstruction or inflammation. Increased size of peritoneal mass abutting the distal transverse and splenic flexure of the colon without bowel dilatation. Vascular/Lymphatic: Multifocal retroperitoneal adenopathy. For example lower aortocaval node measures 18 mm, unchanged. Left external iliac adenopathy, example lymph node measures 18 mm, previously 15. Reproductive:  Post hysterectomy. Other: Interval enlargement of left upper quadrant peritoneal metastatic deposit. Current measurements 17.9 x 10.8 cm cabinet previously 13 x 8.4 cm. No evidence of necrosis or surrounding inflammation. Adjacent mesenteric nodules. Deep pelvic nodule also increased currently 3.7 cm, previously 2.8 cm. Small volume of free fluid in the pelvis. No free air. No loculated fluid collection. Musculoskeletal: Stable right sclerotic iliac bone lesion. No new osseous abnormality. IMPRESSION: 1. Progressive intra-abdominal metastatic disease. 2. Large left upper quadrant peritoneal metastasis has increased in size, currently 18 cm. This abuts the transverse and splenic flexure of colon without obstruction or bowel dilatation. 3. Progressive retroperitoneal and left  iliac adenopathy. Increased size of the deep pelvic peritoneal nodule. 4. Multifocal hepatic metastatic disease with mild progression. 5. Pulmonary nodules are unchanged. Electronically Signed   By: Jeb Levering M.D.   On: 12/15/2015 02:19   Dg Abd Acute W/chest  Result Date: 12/14/2015 CLINICAL DATA:  Nausea vomiting and mid abdominal pain since yesterday. Ovarian cancer. EXAM: DG ABDOMEN ACUTE W/ 1V CHEST COMPARISON:  CT scan 11/26/2015.  X-rays from 11/23/2015. FINDINGS: No edema or focal airspace consolidation. Probable tiny bilateral pleural effusions noted. Cardiopericardial silhouette is at upper limits of normal for size. Right Port-A-Cath remains in place with tip overlying the mid SVC level. Upright film shows no evidence for intraperitoneal free air. Supine film shows no gaseous bowel dilatation suggest obstruction with relatively gasless small bowel. Degenerative changes noted in lumbar spine and bilateral hips, left greater than right. IMPRESSION: 1. Tiny bilateral pleural effusions. 2. No evidence for intraperitoneal free air or bowel obstruction. Electronically Signed   By: Misty Stanley M.D.   On: 12/14/2015 10:32     Procedures Procedures (including critical care time)  Medications Ordered in ED Medications  potassium chloride 10 mEq in 100 mL IVPB (10 mEq Intravenous New Bag/Given 12/15/15 0344)  ondansetron (ZOFRAN-ODT) disintegrating tablet 4 mg (4 mg Oral Given 12/15/15 0012)  sodium chloride 0.9 % bolus 1,000 mL (0 mLs Intravenous Stopped 12/15/15 0344)  ondansetron (ZOFRAN) injection 4 mg (4 mg Intravenous Given 12/15/15 0115)  iopamidol (ISOVUE-300) 61 % injection 100 mL (100 mLs Intravenous Contrast Given 12/15/15 0125)  gi cocktail (Maalox,Lidocaine,Donnatal) (30 mLs Oral Given 12/15/15 0145)     Initial Impression / Assessment and Plan / ED Course  I have reviewed the triage vital signs and the nursing notes.  Pertinent labs & imaging results that were available during my care of the patient were reviewed by me and considered in my medical decision making (see chart for details).  Clinical Course     Final Clinical Impressions(s) / ED Diagnoses   Final diagnoses:  Intractable vomiting with nausea, vomiting of unspecified type   Labs:CBC, CMP  Imaging: CT abdomen and pelvis  Consults:  Therapeutics: Potassium, GI cocktail, normal saline, Zofran  Discharge Meds:   Assessment/Plan:  65 year old female presents today with nausea and vomiting. Patient significant metastatic disease. CT scan here today showed progression of peritoneal metastases increasing in size to 18 cm. No signs of obstruction on exam. Potassium slightly decreased compared to earlier in the day. Patient was seen earlier, discharged home with antinausea medication, reports continued nausea vomiting at home unable to tolerate by mouth. She was given Zofran here in the ED via IV which seemed to improve her symptoms. Patient's presentation likely related to progressive disease, extensive conversation including likely hospice with patient and the son. They're requesting hospital admission, I feel this is appropriate at  this time. Patient will need to come in for IV hydration, continued antinausea medication with hospice consult tomorrow.  Spoke with on-call hospitalist who agreed for observation.    New Prescriptions New Prescriptions   No medications on file   I personally performed the services described in this documentation, which was scribed in my presence. The recorded information has been reviewed and is accurate.    Okey Regal, PA-C 12/15/15 0413    Veatrice Kells, MD 12/15/15 [phone]

## 2015-12-15 NOTE — H&P (Signed)
History and Physical    Melissa Ball [id number] DOB: 1950/09/23 DOA: 12/14/2015  PCP: Cathlean Cower, MD  Outpatient Specialists: none Patient coming from: home  Chief Complaint: vomiting  HPI: Melissa Ball is a 65 y.o. 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  Patient was seen by her oncolgist (Dr. Marko Plume) on 9/7 for follow up on her recurrent ovarian cancer. At that visit,  there was some discussion about pursuing palliative care with a plan to discuss further with her son. She was started on Norco for pain which helped with pain. Three days ago, she started having nausea and vomiting every time she eats or drinks. Emesis was non-bilous or non-bloody. She reports 6 episodes of emesis yesterday. She reports tenesmus but every time she goes to bathroom, there is "nothing coming out". However, she reports having brown watery stool times 2 yesterday. She denies blood or melena. She reports epigastric pain that she describes as "acidy". She also reports recurrent hiccups since yesterday. She says her throat is hurting from vomiting. She denies shortness of breath, chest pain, dysuria, fever or chills. She denies new food or sick contact. She felt progressively week and decided to come in. She says her nausea and vomiting has improved since she came in. She still have some weakness but better than when she came in.  ED Course: vital signs significant for tachycardia to 136 and BP of 145/108. CMP remarkable for K of 3.4, Alb of 2.6 and AST of 49, otherwise normal. CBC with WBC of 17.4, Hgb 8.9(at baseline) and plt of 533. UA normal. DG A/P with tiny bilateral pleural effusions and with no evidence of evidence for intraperitoneal free air or bowel obstruction. CT abdomen with progressive intra-abdominal metastatic disease,  increased large LUQ metastasis measuring 18 cm without obstruction or bowel dilatation, progressive retroperitoneal and left iliac adenopathy with mild progression, multifocal hepatic metastatic disease and unchanged pulmonary nodules. EKG with sinus tachy. QTc 475. Received 1L of NS, 1 mg ativan and 4 mg of Zofran.   Review of Systems: As per HPI otherwise 10 point review of systems negative.   Past Medical History:  Diagnosis Date  . Arthritis   . GERD (gastroesophageal reflux disease)   . History of transfusion   . Hyperlipidemia   . Hypertension   . Ovarian cancer [facility])     Past Surgical History:  Procedure Laterality Date  . JOINT REPLACEMENT  2012   L KNEE  . PORTACATH PLACEMENT    . ROBOTIC ASSISTED TOTAL HYSTERECTOMY WITH BILATERAL SALPINGO OOPHERECTOMY N/A 01/21/2014   Procedure: ROBOTIC ASSISTED TOTAL HYSTERECTOMY WITH BILATERAL SALPINGO OOPHORECTOMY with omentectomy;  Surgeon: Everitt Amber, MD;  Location: [facility];  Service: Gynecology;  Laterality: N/A;     reports that she has been smoking Cigarettes.  She has smoked for the past 20.00 years. She has never used smokeless tobacco. She reports that she drinks alcohol. She reports that she does not use drugs.  No Known Allergies  Family History  Problem Relation Age of Onset  . Hypertension Mother   . Colon cancer Mother 62  . Hypertension Father   . Heart disease Father   . Breast cancer Sister     dx. younger than 42; metastatic  . Alcohol abuse Brother   . Lung cancer Brother 11    smoker  . Lung cancer Brother  dx. 37s; smoker  . Multiple sclerosis Grandchild 17  . Cancer Cousin     dx. 2s; unspecified type  . Breast cancer Cousin     dx. 17s  . Cancer Cousin     (x2 1st cousins); dx. later ages; unspecified type  . Thyroid cancer Other 40  . Cancer Cousin     dx. late 61s; unspecified type    Prior to Admission medications   Medication Sig Start Date End Date Taking? Authorizing Provider    acetaminophen (TYLENOL) 500 MG tablet Take 500 mg by mouth every 6 (six) hours as needed for mild pain. Reported on 09/03/2015   Yes Historical Provider, MD  HYDROcodone-acetaminophen (NORCO/VICODIN) 5-325 MG tablet Take 1 tablet every 4-6 hours as needed for pain. Patient taking differently: Take 1 tablet by mouth every 4 (four) hours as needed (for pain.).  12/10/15  Yes Lennis Marion Downer, MD  lidocaine-prilocaine (EMLA) cream Apply 1 application topically See admin instructions. Apply to Porta-Cath 1-2 hours prior to access as directed.   Yes Historical Provider, MD  LORazepam (ATIVAN) 1 MG tablet Take 1 tablet by mouth or place under tongue to dissolve every 6 hours as needed for nausea or to relax. Will make you drowsy. Patient taking differently: Take 1 mg by mouth every 6 (six) hours as needed (for nausea/to help relax. (MAY DISSOLVE UNDER TONGUE--MEDICATION WILL MAKE YOU DROWSY)).  12/10/15  Yes Lennis Marion Downer, MD  Multiple Vitamin (MULTIVITAMIN) capsule Take 1 capsule by mouth every other day. One-a-Day Proactive 65+   Yes Historical Provider, MD  ondansetron (ZOFRAN ODT) 8 MG disintegrating tablet 8mg  ODT q4 hours prn nausea Patient taking differently: Take 8 mg by mouth every 4 (four) hours as needed for nausea.  12/14/15  Yes Sam Winfred Leeds, MD  pantoprazole (PROTONIX) 40 MG tablet TAKE 1 TABLET (40 MG TOTAL) BY MOUTH DAILY. 10/28/15  Yes Biagio Borg, MD  polyethylene glycol powder [facility]) powder Take 17 g by mouth daily as needed for mild constipation. Reported on 09/03/2015   Yes Historical Provider, MD  simethicone (GAS-X EXTRA STRENGTH) 125 MG chewable tablet Chew 125 mg by mouth every 6 (six) hours as needed for flatulence.   Yes Historical Provider, MD    Physical Exam: Vitals:   12/15/15 0253 12/15/15 0327 12/15/15 0359 12/15/15 0517  BP: 142/96 143/98 145/97 (!) 150/98  Pulse: 119 (!) 121 (!) 130 (!) 122  Resp: 17 17 18 18   Temp:    98.2 F (36.8 C)  TempSrc:    Oral  SpO2:  95% 97% 97% 98%  Weight:    99.6 kg (219 lb 9.3 oz)  Height:    5\' 4"  (1.626 m)      Constitutional: NAD, calm, comfortable Vitals:   12/15/15 0253 12/15/15 0327 12/15/15 0359 12/15/15 0517  BP: 142/96 143/98 145/97 (!) 150/98  Pulse: 119 (!) 121 (!) 130 (!) 122  Resp: 17 17 18 18   Temp:    98.2 F (36.8 C)  TempSrc:    Oral  SpO2: 95% 97% 97% 98%  Weight:    99.6 kg (219 lb 9.3 oz)  Height:    5\' 4"  (1.626 m)   Eyes: PERRL, sclera anicteric, lids and conjunctivae pale ENMT: Mucous membranes are moist. Posterior pharynx clear of any exudate or lesions.No dentition.  Neck: normal, supple, no masses, no thyromegaly Respiratory: clear to auscultation bilaterally, no wheezing, no crackles. Normal respiratory effort. No accessory muscle use.  Cardiovascular: Regular rate and rhythm,  no murmurs / rubs / gallops. No extremity edema. Cap refills < 3 seconds Abdomen: mildly tender over epigastric and LUQ, diffuse global hard mass palpated (peritoneal caking), bowel sounds positive.  Musculoskeletal: no clubbing / cyanosis. Normal muscle tone.  Skin: no rashes, lesions, ulcers. No induration. PAC in place in her right chest without surrounding skin erythema.  Neurologic: CN 2-12 grossly intact. Strength 5/5 in all 4.  Psychiatric: Normal judgment and insight. Alert and oriented x 3. Normal mood.   Labs on Admission: I have personally reviewed following labs and imaging studies  CBC:  Recent Labs Lab 12/10/15 0857 12/14/15 0950 12/15/15 0147  WBC 13.5* 17.4* 14.5*  NEUTROABS 10.9*  --  12.5*  HGB 8.6* 8.9* 8.8*  HCT 26.9* 27.6* 27.3*  MCV 78.4* 78.9 77.1*  PLT 439* 533* [id number]*   Basic Metabolic Panel:  Recent Labs Lab 12/10/15 0857 12/14/15 0950 12/15/15 0147  NA 138 137 140  K 3.4* 3.4* 3.1*  CL  --  100* 101  CO2 23 26 28   GLUCOSE 86 92 94  BUN 9.5 11 14   CREATININE 0.7 0.77 0.88  CALCIUM 9.1 9.2 8.5*   GFR: Estimated Creatinine Clearance: 72.2 mL/min (by C-G  formula based on SCr of 0.88 mg/dL). Liver Function Tests:  Recent Labs Lab 12/10/15 0857 12/14/15 0950 12/15/15 0147  AST 45* 49* 42*  ALT 17 19 17   ALKPHOS 149 149* 126  BILITOT 0.50 0.6 0.6  PROT 7.4 7.7 7.0  ALBUMIN 2.1* 2.6* 2.3*    Recent Labs Lab 12/14/15 0950  LIPASE 15   No results for input(s): AMMONIA in the last 168 hours. Coagulation Profile: No results for input(s): INR, PROTIME in the last 168 hours. Cardiac Enzymes: No results for input(s): CKTOTAL, CKMB, CKMBINDEX, TROPONINI in the last 168 hours. BNP (last 3 results) No results for input(s): PROBNP in the last 8760 hours. HbA1C: No results for input(s): HGBA1C in the last 72 hours. CBG: No results for input(s): GLUCAP in the last 168 hours. Lipid Profile: No results for input(s): CHOL, HDL, LDLCALC, TRIG, CHOLHDL, LDLDIRECT in the last 72 hours. Thyroid Function Tests: No results for input(s): TSH, T4TOTAL, FREET4, T3FREE, THYROIDAB in the last 72 hours. Anemia Panel: No results for input(s): VITAMINB12, FOLATE, FERRITIN, TIBC, IRON, RETICCTPCT in the last 72 hours. Urine analysis:    Component Value Date/Time   COLORURINE YELLOW 12/14/2015 1215   APPEARANCEUR CLOUDY (A) 12/14/2015 1215   LABSPEC 1.019 12/14/2015 1215   LABSPEC 1.020 06/11/2015 0804   PHURINE 6.0 12/14/2015 1215   GLUCOSEU NEGATIVE 12/14/2015 1215   GLUCOSEU Negative 06/11/2015 0804   HGBUR NEGATIVE 12/14/2015 1215   BILIRUBINUR NEGATIVE 12/14/2015 1215   BILIRUBINUR Negative 06/11/2015 0804   KETONESUR NEGATIVE 12/14/2015 1215   PROTEINUR 30 (A) 12/14/2015 1215   UROBILINOGEN 0.2 06/11/2015 0804   NITRITE NEGATIVE 12/14/2015 1215   LEUKOCYTESUR NEGATIVE 12/14/2015 1215   LEUKOCYTESUR Small 06/11/2015 0804   Sepsis Labs: @LABRCNTIP (procalcitonin:4,lacticidven:4) ) Recent Results (from the past 240 hour(s))  TECHNOLOGIST REVIEW     Status: None   Collection Time: 12/10/15  8:57 AM  Result Value Ref Range Status    Technologist Review Rare metamyelocyte and nRBC, rouleaux present  Final     Radiological Exams on Admission: Ct Abdomen Pelvis W Contrast  Result Date: 12/15/2015 CLINICAL DATA:  Abdominal pain and vomiting. Patient with history of ovarian cancer and liver metastasis. EXAM: CT ABDOMEN AND PELVIS WITH CONTRAST TECHNIQUE: Multidetector CT imaging of the abdomen and  pelvis was performed using the standard protocol following bolus administration of intravenous contrast. CONTRAST:  143mL ISOVUE-300 IOPAMIDOL (ISOVUE-300) INJECTION 61% COMPARISON:  CT 11/26/2015 FINDINGS: Lower chest: Right middle lobe nodule measures 5 mm. This is unchanged. Additional small nodule in the right middle lobe, unchanged. Chronic right middle and lower left lower lobe atelectasis. Pleural-based nodule measures 8 mm. Tiny left pleural effusion. Small pericardial nodes are unchanged. Small retrocrural periaortic nodes have increased. Hepatobiliary: Multifocal hepatic metastatic disease. Index lesion in the subcapsular right lobe measures 7.7 x 5.0 cm, previously 5.7 x 4.2 cm by my retrospective measurement. Left lobe lesion measures 6.7 cm, previously 6.8, however differences likely secondary to caliper placement. Additional multifocal lesions have also increased in size. Gallbladder decompressed, no calcified stone. Pancreas: No ductal dilatation or inflammation. Mild atrophic parenchyma. Spleen: Normal in size. Adrenals/Urinary Tract: Right adrenal nodule is unchanged. Left adrenal gland normal. Symmetric renal enhancement and excretion without hydronephrosis. Bladder is minimally distended. Stomach/Bowel: Moderate hiatal hernia. No bowel obstruction or inflammation. Increased size of peritoneal mass abutting the distal transverse and splenic flexure of the colon without bowel dilatation. Vascular/Lymphatic: Multifocal retroperitoneal adenopathy. For example lower aortocaval node measures 18 mm, unchanged. Left external iliac  adenopathy, example lymph node measures 18 mm, previously 15. Reproductive: Post hysterectomy. Other: Interval enlargement of left upper quadrant peritoneal metastatic deposit. Current measurements 17.9 x 10.8 cm cabinet previously 13 x 8.4 cm. No evidence of necrosis or surrounding inflammation. Adjacent mesenteric nodules. Deep pelvic nodule also increased currently 3.7 cm, previously 2.8 cm. Small volume of free fluid in the pelvis. No free air. No loculated fluid collection. Musculoskeletal: Stable right sclerotic iliac bone lesion. No new osseous abnormality. IMPRESSION: 1. Progressive intra-abdominal metastatic disease. 2. Large left upper quadrant peritoneal metastasis has increased in size, currently 18 cm. This abuts the transverse and splenic flexure of colon without obstruction or bowel dilatation. 3. Progressive retroperitoneal and left iliac adenopathy. Increased size of the deep pelvic peritoneal nodule. 4. Multifocal hepatic metastatic disease with mild progression. 5. Pulmonary nodules are unchanged. Electronically Signed   By: Jeb Levering M.D.   On: 12/15/2015 02:19   Dg Abd Acute W/chest  Result Date: 12/14/2015 CLINICAL DATA:  Nausea vomiting and mid abdominal pain since yesterday. Ovarian cancer. EXAM: DG ABDOMEN ACUTE W/ 1V CHEST COMPARISON:  CT scan 11/26/2015.  X-rays from 11/23/2015. FINDINGS: No edema or focal airspace consolidation. Probable tiny bilateral pleural effusions noted. Cardiopericardial silhouette is at upper limits of normal for size. Right Port-A-Cath remains in place with tip overlying the mid SVC level. Upright film shows no evidence for intraperitoneal free air. Supine film shows no gaseous bowel dilatation suggest obstruction with relatively gasless small bowel. Degenerative changes noted in lumbar spine and bilateral hips, left greater than right. IMPRESSION: 1. Tiny bilateral pleural effusions. 2. No evidence for intraperitoneal free air or bowel obstruction.  Electronically Signed   By: Misty Stanley M.D.   On: 12/14/2015 10:32    EKG: Independently reviewed.   Assessment/Plan Active Problems:   Intractable vomiting   Emesis   Intractable vomiting: improved. Doubt infectious etiology. However, patient is immune compromised. Imaging without obstructive pathology. She was recently started on Norco which could contribute. She is HDS after 1L of bolus and mIVF. -admit for observation -continue mIVF at 150cc/hr -Zofran as needed.  -Chlorpromazine for nausea and hiccup -Norco for pain -Miralax as needed for 1-2 BM a day -ADAT  SIRS: patient 2/4 SIRS (tachycardia and Leucocytosis). However, she doesn't appear  septic or infectious to start antibiotics. She is HDS -Will monitor vital signs -Repeat CBC in the morning  Recurrent platinum resistant left ovarian carcinoma with hepatic and peritoneal metastasis: she failed multiple chemo's. Last on Letrozole that was discontinued in 11/2015 per Onc note. CT abdomen on admission shows progression of the metastasis. Conversation about palliative care started at her last visit with her oncologist (Dr. Marko Plume) on 9/7.  PAC in, and flushed on 12/10/2014.  -Touch base with her Oncologist  Chemo induced anemia: Hgb stable at 8.8 (baseline) -CBC in the morning.   Hypertension: mildly hypertensive -will watch in the setting of emesis now  Hypokalemia: K 3.1 -Repleting  Epigastric pain: likely gastritis -Protonix -GI cocktail as needed.   DVT prophylaxis: Lovenox Code Status: DNR Family Communication: Son Linus Orn). Phone [phone]. Attempted to call. Not answering Disposition Plan: home Consults called: none Admission status: Med-Surg   Wendee Beavers, MD [facility] Resident Page [phone]  If 7PM-7AM, please contact night-coverage [email or web address] Password [email or web address]  12/15/2015, 8:18 AM

## 2015-12-15 NOTE — Care Management Obs Status (Signed)
[place] NOTIFICATION   Patient Details  Name: ELEANA GAREE MRN: [id number] Date of Birth: 07-01-1950   Medicare Observation Status Notification Given:  Yes    Dellie Catholic, RN 12/15/2015, 3:01 PM

## 2015-12-15 NOTE — Progress Notes (Signed)
  65 year old female with pmh of metastatic ovarian cancer s/p hysterectomy and chemotherapy; who presents with intractable nausea and vomiting for the last 1 week. Seen in the ED yesterday for similar symptoms, but was given Zofran and some IV fluids and was able to be discharged home. Lab work slightly improved from previous. Potassium initially 3.1 given 10 mEq of potassium chloride in the ED. Normal saline IV fluid as ordered. CT of abdomen repeated showing no obstruction, but showing interval progression from 8/24 CT scan. Family wants oncology's input. May want to discuss hospice with family at this time.

## 2015-12-15 NOTE — ED Notes (Signed)
Patient transported to CT 

## 2015-12-15 NOTE — Progress Notes (Signed)
Medical Oncology December 15, 2015, 1:20 PM  Hospital day 1 Antibiotics: none Chemotherapy: discontinued  Appreciate notification by hospitalist of admission this AM, with intractable nausea vomiting.  EMR reviewed including PACs images from CT AP.  Talked with son at length on unit now. Note son was with her this past weekend, no acute problems then, but these problems happened just as he returned to home in [place] - he was just able to return ahead of storm back from [place], wife and children there without power.   Subjective: Rouses briefly to voice, recognizes me, denies pain or nausea, then back asleep. Per son, this is the first time that she has been comfortable or able to rest in >24 hrs. Clear liquids at bedside, has not tried these. Per son, complaining of GERD and throat burning since vomiting   She was transfused 2 units PRBcs on 11-26-15, which seemed somewhat helpful. BP better with antihypertensives held.    PAC in Genetics testing sent 03-02-15, normal by Breast Ovarian Panel by [facility] CA 125 was 1406 in 10-2013   ONCOLOGIC HISTORY Patient had acute abdominal pain Dec 2014, which resolved without medical attention, then vague diffuse abdominal discomfort, low grade nausea and abdominal bloating. She was seen in June 2015 for first gyn exam in years, PAP with AGUS and follow up biopsies of endocervix and endometrium by Dr Pamala Hurry both with serous apparent endometrial cancer (pathology from [facility] 09[facility]12-13, case # [id number] to be scanned into this EMR). Biopsy of cervix had normal squamous epithelium. CA125 from [facility] 09-16-13 was 964, and was up to 1406 by 10-23-13.Marland Kitchen She was seen by Dr Denman George on 09-23-13, her exam remarkable for large pelvic mass minimally mobile and extending to umbilicus. She had CT CAP 09-26-13 had prominent but not enlarged juxtapericardiac lymph nodes, trace right pleural effusion, large amount of abnormal soft tissue thruout  peritoneal cavity, predominately with omentum and especially LUQ, largest area 3.8 x 3.0 x 4.0 cm anterior to proximal descending colon, implants adjacent to liver, large complex multicystic lesion in pelvis inseperable from uterus and adnexae, with mass effect on bladder, questionable area in central aspect of dome of liver, borderline enlarged retroperitoneal nodes. Due to extent of disease, treatment began with chemotherapy. Cycle one taxol carboplatin was given on [id number], complicated by severe nausea/vomiting, constipation and severe taxol aches. Cycle 2 was changed to dose dense carbo taxol, day 1 on 11-01-13. She needed gCSF support by cycle 3. Neoadjuvant chemo was one cycle carbo taxol standard dose and 3 cycles dose dense. 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 She resumed chemo with cycle 4 on 02-21-14 and completed cycle 6 on 04-25-14. She had unremarkable exam by Dr Denman George 08-11-14, however increase in CA 125 marker then, from 8 in 05-2014 to 26 in 08-2014, and repeat 63 on 09-08-14. CT AP 09-16-14 shows progressive peritoneal metastases, increasing retroperitoneal and transverse mesocolon nodes and soft tissue area at vaginal cuff stable to slightly decreased. She began doxil on 10-10-14, CA 125 151 that day as baseline for doxil. She had 4 cycles of doxil thru 01-09-15, then CT 02-11-15 showed mixed response but with multiple new liver metastases. CA 125 on 02-02-15 was 299. She began weekly taxol with avastin 03-12-15. Restaging CT AP after 3 cycles 05-29-15 showed improvement in omental and vaginal cuff involvement, single liver lesion slightly larger and right external iliac  node increased, no other new disease, other liver areas stable to slightly smaller. Taxol avastin continued thru day 8 cycle 6 on 08-06-15, with repeat CT AP 09-02-15 showing increase in hepatic lesionsand scattered  peritoneal involvement. She began letrozole on 09-03-15, DCd with progression 11-2015.   Objective: Vital signs in last 24 hours: Blood pressure (!) 150/98, pulse (!) 122, temperature 98.2 F (36.8 C), temperature source Oral, resp. rate 18, height 5\' 4"  (1.626 m), weight 219 lb 9.3 oz (99.6 kg), SpO2 98 %. Respirations not labored. Speech fluent. Heart RRR. PAC site ok, infusing without difficulty. Lungs clear anteriorly. Abdomen soft, quiet, not distended.   Intake/Output from previous day: 09/11 0701 - 09/12 0700 In: 220 [P.O.:120; IV Piggyback:100] Out: -  Intake/Output this shift: Total I/O In: 290 [I.V.:190; IV Piggyback:100] Out: -     Lab Results:  Recent Labs  12/14/15 0950 12/15/15 0147  WBC 17.4* 14.5*  HGB 8.9* 8.8*  HCT 27.6* 27.3*  PLT 533* 419*   BMET  Recent Labs  12/14/15 0950 12/15/15 0147  NA 137 140  K 3.4* 3.1*  CL 100* 101  CO2 26 28  GLUCOSE 92 94  BUN 11 14  CREATININE 0.77 0.88  CALCIUM 9.2 8.5*    Studies/Results: Ct Abdomen Pelvis W Contrast  Result Date: 12/15/2015 CLINICAL DATA:  Abdominal pain and vomiting. Patient with history of ovarian cancer and liver metastasis. EXAM: CT ABDOMEN AND PELVIS WITH CONTRAST TECHNIQUE: Multidetector CT imaging of the abdomen and pelvis was performed using the standard protocol following bolus administration of intravenous contrast. CONTRAST:  12mL ISOVUE-300 IOPAMIDOL (ISOVUE-300) INJECTION 61% COMPARISON:  CT 11/26/2015 FINDINGS: Lower chest: Right middle lobe nodule measures 5 mm. This is unchanged. Additional small nodule in the right middle lobe, unchanged. Chronic right middle and lower left lower lobe atelectasis. Pleural-based nodule measures 8 mm. Tiny left pleural effusion. Small pericardial nodes are unchanged. Small retrocrural periaortic nodes have increased. Hepatobiliary: Multifocal hepatic metastatic disease. Index lesion in the subcapsular right lobe measures 7.7 x 5.0 cm, previously 5.7 x  4.2 cm by my retrospective measurement. Left lobe lesion measures 6.7 cm, previously 6.8, however differences likely secondary to caliper placement. Additional multifocal lesions have also increased in size. Gallbladder decompressed, no calcified stone. Pancreas: No ductal dilatation or inflammation. Mild atrophic parenchyma. Spleen: Normal in size. Adrenals/Urinary Tract: Right adrenal nodule is unchanged. Left adrenal gland normal. Symmetric renal enhancement and excretion without hydronephrosis. Bladder is minimally distended. Stomach/Bowel: Moderate hiatal hernia. No bowel obstruction or inflammation. Increased size of peritoneal mass abutting the distal transverse and splenic flexure of the colon without bowel dilatation. Vascular/Lymphatic: Multifocal retroperitoneal adenopathy. For example lower aortocaval node measures 18 mm, unchanged. Left external iliac adenopathy, example lymph node measures 18 mm, previously 15. Reproductive: Post hysterectomy. Other: Interval enlargement of left upper quadrant peritoneal metastatic deposit. Current measurements 17.9 x 10.8 cm cabinet previously 13 x 8.4 cm. No evidence of necrosis or surrounding inflammation. Adjacent mesenteric nodules. Deep pelvic nodule also increased currently 3.7 cm, previously 2.8 cm. Small volume of free fluid in the pelvis. No free air. No loculated fluid collection. Musculoskeletal: Stable right sclerotic iliac bone lesion. No new osseous abnormality. IMPRESSION: 1. Progressive intra-abdominal metastatic disease. 2. Large left upper quadrant peritoneal metastasis has increased in size, currently 18 cm. This abuts the transverse and splenic flexure of colon without obstruction or bowel dilatation. 3. Progressive retroperitoneal and left iliac adenopathy. Increased size of the deep pelvic peritoneal nodule. 4. Multifocal hepatic metastatic  disease with mild progression. 5. Pulmonary nodules are unchanged. Electronically Signed   By: Jeb Levering M.D.   On: 12/15/2015 02:19   Dg Abd Acute W/chest  Result Date: 12/14/2015 CLINICAL DATA:  Nausea vomiting and mid abdominal pain since yesterday. Ovarian cancer. EXAM: DG ABDOMEN ACUTE W/ 1V CHEST COMPARISON:  CT scan 11/26/2015.  X-rays from 11/23/2015. FINDINGS: No edema or focal airspace consolidation. Probable tiny bilateral pleural effusions noted. Cardiopericardial silhouette is at upper limits of normal for size. Right Port-A-Cath remains in place with tip overlying the mid SVC level. Upright film shows no evidence for intraperitoneal free air. Supine film shows no gaseous bowel dilatation suggest obstruction with relatively gasless small bowel. Degenerative changes noted in lumbar spine and bilateral hips, left greater than right. IMPRESSION: 1. Tiny bilateral pleural effusions. 2. No evidence for intraperitoneal free air or bowel obstruction. Electronically Signed   By: Misty Stanley M.D.   On: 12/14/2015 10:32    MEDICATIONS: Reviewed in EMR.  back on protonix, which she had not been using at home.  I have added carafate 1 gm dissolved in water ac hs  Discussion: son understands rapid progression of disease in liver and abdomen, may have a few months at most but may be less time than that. He is in agreement with hospice, understands that their goal will be comfort and quality of life; he is particularly concerned that she not be in pain. I have explained Palliative Care consult, which will be helpful for patient as well as family. Son will need FMLA for his work (high school coach in [place]). He is relieved that she is comfortable now.  Son is her only child, very attentive, calls her several times daily when he is in [place].  Several grands including granddaughter first year at [employer] extended family locally. Lives alone.   Assessment/Plan:  1. 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 Has progressed on doxil, taxol avastin and letrozole. Progression in multiple large areas in liver and large LUQ peritoneal mass by CT on admission compared with 11-26-15. Comfort interventions. Palliative Care Consult already requested, needs Hospice at DC.  Genetics testing normal 2.HTN previouslywith avastin, requiring lisinopril HCTZ then. BP low recently,  improved off of medication 3. PAC in 4. Degenerative arthritis low back and hips 5.Anemia progressive, no clear bleeding, minimal improvement with PRBCs on 11-26-15 6.neuropathy symptoms from taxol feet>hands no worse 7.Patient does not want resuscitation or life support in irreversible situation, and understands that this malignancy is not curable. Code status DNR per my discussion with her on 12-10-15; Out of Facility DNR given that day.  8.EF good  9..morbid obesity 10. Continues some smoking, which she has not wanted to stop  All questions answered for son. Time spent 25 min including > 50% counseling and coordination of care. Please page me if needed between my rounds [place]

## 2015-12-16 ENCOUNTER — Telehealth: Payer: Self-pay

## 2015-12-16 DIAGNOSIS — G43A1 Cyclical vomiting, intractable: Secondary | ICD-10-CM | POA: Diagnosis not present

## 2015-12-16 DIAGNOSIS — R1110 Vomiting, unspecified: Secondary | ICD-10-CM | POA: Diagnosis not present

## 2015-12-16 DIAGNOSIS — Z515 Encounter for palliative care: Secondary | ICD-10-CM | POA: Diagnosis not present

## 2015-12-16 DIAGNOSIS — C562 Malignant neoplasm of left ovary: Secondary | ICD-10-CM | POA: Diagnosis not present

## 2015-12-16 DIAGNOSIS — Z7189 Other specified counseling: Secondary | ICD-10-CM

## 2015-12-16 DIAGNOSIS — C787 Secondary malignant neoplasm of liver and intrahepatic bile duct: Secondary | ICD-10-CM | POA: Diagnosis not present

## 2015-12-16 MED ORDER — POTASSIUM CHLORIDE CRYS ER 20 MEQ PO TBCR
40.0000 meq | EXTENDED_RELEASE_TABLET | Freq: Once | ORAL | Status: AC
  Administered 2015-12-16: 40 meq via ORAL
  Filled 2015-12-16: qty 2

## 2015-12-16 MED ORDER — BACLOFEN 10 MG PO TABS: 5.0000 mg | ORAL_TABLET | Freq: Three times a day (TID) | ORAL | 0 refills | Status: AC | PRN

## 2015-12-16 MED ORDER — HEPARIN SOD (PORK) LOCK FLUSH 100 UNIT/ML IV SOLN
500.0000 [IU] | INTRAVENOUS | Status: AC | PRN
  Administered 2015-12-16: 500 [IU]

## 2015-12-16 MED ORDER — SUCRALFATE 1 G PO TABS: 1.0000 g | ORAL_TABLET | Freq: Three times a day (TID) | ORAL | 0 refills | Status: DC

## 2015-12-16 NOTE — Telephone Encounter (Signed)
-----   Message from Gordy Levan, MD sent at 12/16/2015 12:57 PM EDT ----- Regarding: RE: Flu Shot. Fine to get it in hospital now ----- Message ----- From: Baruch Merl, RN Sent: 12/16/2015  12:13 PM To: Gordy Levan, MD Subject: Flu Shot.                                      Son left message stating that Ms Yoffe forgot to ask if she should get her flu shot now or wait until she see you in the office?  Barbaraann Share

## 2015-12-16 NOTE — Consult Note (Signed)
Consultation Note Date: 12/16/2015   Patient Name: Melissa Ball  DOB: [birth year]  MRN: [id number]  Age / Sex: 65 y.o., female  PCP: Biagio Borg, MD Referring Physician: Theodis Blaze, MD  Reason for Consultation: Establishing goals of care, Hospice Evaluation and Psychosocial/spiritual support  HPI/Patient Profile: 65 y.o. 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   Clinical Assessment and Goals of Care: Met today with patient and her son [place].  She reports that the most important things to her are her faith, her family, being comfortable, and being out of the hospital.  She wants to go to her home to live until she can no longer function, but does not want to die in her own home.   She reports her physicians have been doing a good job explaining things to her and she understands that she has metastatic disease that cannot be cured and her desire is to transition home with hospice with goal of living as well as possible for as long as possible.  She has been managing her ADLs and living independently, but she and her son acknowledge that this will continue to change as she progresses.  She lives alone and her son lives is [place], but she has numerous family members in her neighborhood including sisters who live across the street from her.  Her son also reports that he is going to move in when she becomes more frail.  Symptomatically, she reports feeling much better on current regimen.     SUMMARY OF RECOMMENDATIONS   - Patient desires home hospice on discharge.  She wants to leave today and does not want to wait to meet with hospice liaison in hospital.  She desires to have hospice f/u to enroll her at home tomorrow. - Requests [facility] as other family has utilized their  services and she is interested in [facility] when she reaches the last few weeks of life. - She would like Dr. Marko Plume to be her hospice physician  Code Status/Advance Care Planning:  DNR- Son reports durable DNR at home. He declined another copy today.   Symptom Management:   Well controlled currently.  Recommend continue same regimen on discharge.  Hospice to further titrate meds as needed.  Palliative Prophylaxis:   Aspiration and Bowel Regimen  Additional Recommendations (Limitations, Scope, Preferences):  Avoid Hospitalization  Psycho-social/Spiritual:   Desire for further Chaplaincy support:to f/u with hospice chaplain and her pastor on discharge  Additional Recommendations: Caregiving  Support/Resources and Education on Hospice  Prognosis:   < 6 months  Discharge Planning: Home with Hospice      Primary Diagnoses: Present on Admission: . Intractable vomiting . Emesis . International Federation of Gynecology and Obstetrics (FIGO) stage IVB epithelial ovarian cancer ([place]) . Metastasis to liver ([place]) . Obesity, morbid, BMI 40.0-49.9 ([place])   I have reviewed the medical record, interviewed the patient and family, and examined the patient. The  following aspects are pertinent.  Past Medical History:  Diagnosis Date  . Arthritis   . GERD (gastroesophageal reflux disease)   . History of transfusion   . Hyperlipidemia   . Hypertension   . Ovarian cancer [facility])    Social History   Social History  . Marital status: Divorced    Spouse name: N/A  . Number of children: N/A  . Years of education: N/A   Occupational History  . Housekeeper [employer]   Social History Main Topics  . Smoking status: Current Some Day Smoker    Years: 20.00    Types: Cigarettes  . Smokeless tobacco: Never Used     Comment: few cigs here and there  . Alcohol use 0.0 oz/week     Comment: RARE  . Drug use: No  . Sexual activity: No   Other Topics Concern  . None   Social  History Narrative   Regular exercise-yes   Caffeine use-no            Family History  Problem Relation Age of Onset  . Hypertension Mother   . Colon cancer Mother 53  . Hypertension Father   . Heart disease Father   . Breast cancer Sister     dx. younger than 88; metastatic  . Alcohol abuse Brother   . Lung cancer Brother 26    smoker  . Lung cancer Brother     dx. 31s; smoker  . Multiple sclerosis Grandchild 17  . Cancer Cousin     dx. 23s; unspecified type  . Breast cancer Cousin     dx. 38s  . Cancer Cousin     (x2 1st cousins); dx. later ages; unspecified type  . Thyroid cancer Other 40  . Cancer Cousin     dx. late 48s; unspecified type   Scheduled Meds: . enoxaparin (LOVENOX) injection  40 mg Subcutaneous Q24H  . Influenza vac split quadrivalent PF  0.5 mL Intramuscular Tomorrow-1000  . multivitamin with minerals  1 tablet Oral QODAY  . pantoprazole  40 mg Oral Daily  . potassium chloride  40 mEq Oral Once  . sucralfate  1 g Oral TID WC & HS   Continuous Infusions:  PRN Meds:.acetaminophen, baclofen, HYDROcodone-acetaminophen, lidocaine-prilocaine, LORazepam, ondansetron, polyethylene glycol, simethicone, sodium chloride flush Medications Prior to Admission:  Prior to Admission medications   Medication Sig Start Date End Date Taking? Authorizing Provider  acetaminophen (TYLENOL) 500 MG tablet Take 500 mg by mouth every 6 (six) hours as needed for mild pain. Reported on 09/03/2015   Yes Historical Provider, MD  HYDROcodone-acetaminophen (NORCO/VICODIN) 5-325 MG tablet Take 1 tablet every 4-6 hours as needed for pain. Patient taking differently: Take 1 tablet by mouth every 4 (four) hours as needed (for pain.).  12/10/15  Yes Lennis Marion Downer, MD  lidocaine-prilocaine (EMLA) cream Apply 1 application topically See admin instructions. Apply to Porta-Cath 1-2 hours prior to access as directed.   Yes Historical Provider, MD  LORazepam (ATIVAN) 1 MG tablet Take 1 tablet by  mouth or place under tongue to dissolve every 6 hours as needed for nausea or to relax. Will make you drowsy. Patient taking differently: Take 1 mg by mouth every 6 (six) hours as needed (for nausea/to help relax. (MAY DISSOLVE UNDER TONGUE--MEDICATION WILL MAKE YOU DROWSY)).  12/10/15  Yes Lennis Marion Downer, MD  Multiple Vitamin (MULTIVITAMIN) capsule Take 1 capsule by mouth every other day. One-a-Day Proactive 65+   Yes Historical Provider, MD  ondansetron [facility]  ODT) 8 MG disintegrating tablet '8mg'$  ODT q4 hours prn nausea Patient taking differently: Take 8 mg by mouth every 4 (four) hours as needed for nausea.  12/14/15  Yes Sam Winfred Leeds, MD  pantoprazole (PROTONIX) 40 MG tablet TAKE 1 TABLET (40 MG TOTAL) BY MOUTH DAILY. 10/28/15  Yes Biagio Borg, MD  polyethylene glycol powder [facility]) powder Take 17 g by mouth daily as needed for mild constipation. Reported on 09/03/2015   Yes Historical Provider, MD  simethicone (GAS-X EXTRA STRENGTH) 125 MG chewable tablet Chew 125 mg by mouth every 6 (six) hours as needed for flatulence.   Yes Historical Provider, MD  baclofen (LIORESAL) 10 MG tablet Take 0.5 tablets (5 mg total) by mouth 3 (three) times daily as needed (hiccups). 12/16/15   Theodis Blaze, MD  sucralfate (CARAFATE) 1 g tablet Take 1 tablet (1 g total) by mouth 4 (four) times daily -  with meals and at bedtime. 12/16/15   Theodis Blaze, MD   No Known Allergies Review of Systems  Constitutional: Positive for activity change, appetite change and fatigue.  Gastrointestinal: Positive for nausea.  Neurological: Positive for weakness.  All other systems reviewed and are negative.   Physical Exam General: Alert, awake, in no acute distress.  HEENT: No bruits, no goiter, no JVD Heart: Regular rate and rhythm. No murmur appreciated. Lungs: Good air movement, clear Abdomen: Soft, nontender, nondistended, positive bowel sounds.  Ext: No significant edema Skin: Warm and dry Neuro: Grossly  intact, nonfocal.  Vital Signs: BP 123/84 (BP Location: Left Arm)   Pulse (!) 117   Temp 98.7 F (37.1 C) (Oral)   Resp 20   Ht '5\' 4"'$  (1.626 m)   Wt 99.6 kg (219 lb 9.3 oz)   SpO2 99%   BMI 37.69 kg/m  Pain Assessment: No/denies pain   Pain Score: 6    SpO2: SpO2: 99 % O2 Device:SpO2: 99 % O2 Flow Rate: .   IO: Intake/output summary:  Intake/Output Summary (Last 24 hours) at 12/16/15 1516 Last data filed at 12/16/15 5329  Gross per 24 hour  Intake              450 ml  Output              100 ml  Net              350 ml    LBM: Last BM Date: 12/15/15 Baseline Weight: Weight: 99.6 kg (219 lb 9.3 oz) Most recent weight: Weight: 99.6 kg (219 lb 9.3 oz)     Palliative Assessment/Data: 50%   Flowsheet Rows   Flowsheet Row Most Recent Value  Intake Tab  Referral Department  Hospitalist  Unit at Time of Referral  Cardiac/Telemetry Unit  Palliative Care Primary Diagnosis  Cancer  Date Notified  12/15/15  Palliative Care Type  New Palliative care  Reason for referral  Clarify Goals of Care, Counsel Regarding Hospice  Date of Admission  12/14/15  Date first seen by Palliative Care  12/16/15  # of days Palliative referral response time  1 Day(s)  # of days IP prior to Palliative referral  1  Clinical Assessment  Psychosocial & Spiritual Assessment  Palliative Care Outcomes      Time In:   1300 Time Out: 1350 Time Total: 50 Greater than 50%  of this time was spent counseling and coordinating care related to the above assessment and plan.  Signed by: Micheline Rough, MD   Please contact  Palliative Medicine Team phone at [phone] for questions and concerns.  For individual provider: See Amion             

## 2015-12-16 NOTE — Progress Notes (Signed)
Notified by Juliann Pulse [facility] of family request for Hospice and [place] services at home after discharge.   Chart and patient information currently under review to confirm hospice eligibility. Spoke with son and patient to initiate education related to hospice philosophy, services and team approach to care. Family verbalized understanding of the information provided. Per discussion, plan is for discharge to home by personal vehicle with son today.  Please send signed completed DNR form home with patient. Patient will need prescriptions for discharge comfort medications.  DME needs discussed and family denied any needs at this time.  [facility] aware of the above. Completed discharge summary will need to be faxed to [facility] at [phone] when final. [facility] information and contact numbers have been given to[place]ajuan.  Please call with any questions. Thank Rhae Lerner RN, [place] [facility] Liaison [phone]

## 2015-12-16 NOTE — Progress Notes (Signed)
Medical Oncology December 16, 2015, 9:07 AM  Hospital day 2 Antibiotics: none Chemotherapy: discontinued  Outpatient Physicians: Georgeanna Harrison, Hunt Oris, MD , Aloha Gell, Evlyn Clines  EMR reviewed. Son expected but not here presently. Hospitalist team here at completion of my visit.  Subjective: Slept well last pm, states has been back and forth to [place] thru night to void. Denies pain or nausea now, understands that she may need to gradually increase diet up from liquids. No problems with PAC. Bowels moving small amounts, loose as prior to admission.  Denies cough or SOB. No bleeding. Tells me that she and son are in agreement with meeting with Palliative Care team and with Hospice referral. She has strong faith, wants to focus on her priorities now including close family, has questions re funeral arrangements that Hospice likely can answer.    PAC in Genetics testing sent 03-02-15, normal by Breast Ovarian Panel by [facility] CA 125 was 1406 in 10-2013   ONCOLOGIC HISTORY Patient had acute abdominal pain Dec 2014, which resolved without medical attention, then vague diffuse abdominal discomfort, low grade nausea and abdominal bloating. She was seen in June 2015 for first gyn exam in years, PAP with AGUS and follow up biopsies of endocervix and endometrium by Dr Pamala Hurry both with serous apparent endometrial cancer (pathology from [facility] 09-12-13, case # [id number] to be scanned into this EMR). Biopsy of cervix had normal squamous epithelium. CA125 from [facility] 09-16-13 was 964, and was up to 1406 by 10-23-13.Marland Kitchen She was seen by Dr Denman George on 09-23-13, her exam remarkable for large pelvic mass minimally mobile and extending to umbilicus. She had CT CAP 09-26-13 had prominent but not enlarged juxtapericardiac lymph nodes, trace right pleural effusion, large amount of abnormal soft tissue thruout peritoneal cavity, predominately with omentum and especially LUQ, largest area 3.8 x  3.0 x 4.0 cm anterior to proximal descending colon, implants adjacent to liver, large complex multicystic lesion in pelvis inseperable from uterus and adnexae, with mass effect on bladder, questionable area in central aspect of dome of liver, borderline enlarged retroperitoneal nodes. Due to extent of disease, treatment began with chemotherapy. Cycle one taxol carboplatin was given on [id number], complicated by severe nausea/vomiting, constipation and severe taxol aches. Cycle 2 was changed to dose dense carbo taxol, day 1 on 11-01-13. She needed gCSF support by cycle 3. Neoadjuvant chemo was one cycle carbo taxol standard dose and 3 cycles dose dense. 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 She resumed chemo with cycle 4 on 02-21-14 and completed cycle 6 on 04-25-14. She had unremarkable exam by Dr Denman George 08-11-14, however increase in CA 125 marker then, from 8 in 05-2014 to 26 in 08-2014, and repeat 63 on 09-08-14. CT AP 09-16-14 shows progressive peritoneal metastases, increasing retroperitoneal and transverse mesocolon nodes and soft tissue area at vaginal cuff stable to slightly decreased. She began doxil on 10-10-14, CA 125 151 that day as baseline for doxil. She had 4 cycles of doxil thru 01-09-15, then CT 02-11-15 showed mixed response but with multiple new liver metastases. CA 125 on 02-02-15 was 299. She began weekly taxol with avastin 03-12-15. Restaging CT AP after 3 cycles 05-29-15 showed improvement in omental and vaginal cuff involvement, single liver lesion slightly larger and right external iliac node increased, no other new disease, other liver areas stable to slightly smaller. Taxol avastin continued thru day  8 cycle 6 on 08-06-15, with repeat CT AP 09-02-15 showing increase in hepatic lesionsand scattered peritoneal involvement. She began letrozole on 09-03-15, DCd with progression  11-2015.   Objective: Vital signs in last 24 hours: Blood pressure 123/84, pulse (!) 117, temperature 98.7 F (37.1 C), temperature source Oral, resp. rate 20, height 5\' 4"  (1.626 m), weight 219 lb 9.3 oz (99.6 kg), SpO2 99 %. Awake, alert, smiling and looks more comfortable. PERRL, not icteric. Orall mucosa moist. Respirations not labored RA. Lungs without wheezes or rales. Heart RRR. PAC site not remarkable. Abdomen soft, some BS, not distended, not tender to gentle exam. LE trace pedal edema R>L without cords or tenderness. No focal neuro deficits.   Intake/Output from previous day: 09/12 0701 - 09/13 0700 In: 2380 [P.O.:480; I.V.:1800; IV Piggyback:100] Out: 100 [Stool:100] Intake/Output this shift: No intake/output data recorded.   Lab Results:  Recent Labs  12/14/15 0950 12/15/15 0147  WBC 17.4* 14.5*  HGB 8.9* 8.8*  HCT 27.6* 27.3*  PLT 533* 419*   BMET  Recent Labs  12/14/15 0950 12/15/15 0147  NA 137 140  K 3.4* 3.1*  CL 100* 101  CO2 26 28  GLUCOSE 92 94  BUN 11 14  CREATININE 0.77 0.88  CALCIUM 9.2 8.5*    Studies/Results: Ct Abdomen Pelvis W Contrast  Result Date: 12/15/2015 CLINICAL DATA:  Abdominal pain and vomiting. Patient with history of ovarian cancer and liver metastasis. EXAM: CT ABDOMEN AND PELVIS WITH CONTRAST TECHNIQUE: Multidetector CT imaging of the abdomen and pelvis was performed using the standard protocol following bolus administration of intravenous contrast. CONTRAST:  167mL ISOVUE-300 IOPAMIDOL (ISOVUE-300) INJECTION 61% COMPARISON:  CT 11/26/2015 FINDINGS: Lower chest: Right middle lobe nodule measures 5 mm. This is unchanged. Additional small nodule in the right middle lobe, unchanged. Chronic right middle and lower left lower lobe atelectasis. Pleural-based nodule measures 8 mm. Tiny left pleural effusion. Small pericardial nodes are unchanged. Small retrocrural periaortic nodes have increased. Hepatobiliary: Multifocal hepatic  metastatic disease. Index lesion in the subcapsular right lobe measures 7.7 x 5.0 cm, previously 5.7 x 4.2 cm by my retrospective measurement. Left lobe lesion measures 6.7 cm, previously 6.8, however differences likely secondary to caliper placement. Additional multifocal lesions have also increased in size. Gallbladder decompressed, no calcified stone. Pancreas: No ductal dilatation or inflammation. Mild atrophic parenchyma. Spleen: Normal in size. Adrenals/Urinary Tract: Right adrenal nodule is unchanged. Left adrenal gland normal. Symmetric renal enhancement and excretion without hydronephrosis. Bladder is minimally distended. Stomach/Bowel: Moderate hiatal hernia. No bowel obstruction or inflammation. Increased size of peritoneal mass abutting the distal transverse and splenic flexure of the colon without bowel dilatation. Vascular/Lymphatic: Multifocal retroperitoneal adenopathy. For example lower aortocaval node measures 18 mm, unchanged. Left external iliac adenopathy, example lymph node measures 18 mm, previously 15. Reproductive: Post hysterectomy. Other: Interval enlargement of left upper quadrant peritoneal metastatic deposit. Current measurements 17.9 x 10.8 cm cabinet previously 13 x 8.4 cm. No evidence of necrosis or surrounding inflammation. Adjacent mesenteric nodules. Deep pelvic nodule also increased currently 3.7 cm, previously 2.8 cm. Small volume of free fluid in the pelvis. No free air. No loculated fluid collection. Musculoskeletal: Stable right sclerotic iliac bone lesion. No new osseous abnormality. IMPRESSION: 1. Progressive intra-abdominal metastatic disease. 2. Large left upper quadrant peritoneal metastasis has increased in size, currently 18 cm. This abuts the transverse and splenic flexure of colon without obstruction or bowel dilatation. 3. Progressive retroperitoneal and left iliac adenopathy. Increased size of the deep pelvic  peritoneal nodule. 4. Multifocal hepatic metastatic  disease with mild progression. 5. Pulmonary nodules are unchanged. Electronically Signed   By: Jeb Levering M.D.   On: 12/15/2015 02:19   Dg Abd Acute W/chest  Result Date: 12/14/2015 CLINICAL DATA:  Nausea vomiting and mid abdominal pain since yesterday. Ovarian cancer. EXAM: DG ABDOMEN ACUTE W/ 1V CHEST COMPARISON:  CT scan 11/26/2015.  X-rays from 11/23/2015. FINDINGS: No edema or focal airspace consolidation. Probable tiny bilateral pleural effusions noted. Cardiopericardial silhouette is at upper limits of normal for size. Right Port-A-Cath remains in place with tip overlying the mid SVC level. Upright film shows no evidence for intraperitoneal free air. Supine film shows no gaseous bowel dilatation suggest obstruction with relatively gasless small bowel. Degenerative changes noted in lumbar spine and bilateral hips, left greater than right. IMPRESSION: 1. Tiny bilateral pleural effusions. 2. No evidence for intraperitoneal free air or bowel obstruction. Electronically Signed   By: Misty Stanley M.D.   On: 12/14/2015 10:32   Medications reviewed in EMR. Only pain med has been hydrocodone. On prophylactic lovenox. She had one dose of IV thorazine on 9-12, which was shortly prior to my visit then, patient very relaxed and sleeping after that medication.  DISCUSSION: As above, patient is in full agreement with recommendation for symptom management with assistance of Hospice.   I am glad to be attending for Hospice if that would help.    Assessment/Plan: 1. 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 Has progressed on doxil, taxol avastin and letrozole. Progression in multiple large areas in liver and large LUQ peritoneal mass by CT on admission compared with 11-26-15. Comfort interventions. Palliative Care  Consult already requested, needs Hospice at DC, patient and son in agreement. Genetics testing normal 2.HTN previouslywith avastin, requiring lisinopril HCTZ then. BP low recently,  improved off of medication 3. PAC in 4. Degenerative arthritis low back and hips 5.Anemia progressive, no clear bleeding, minimal improvement with PRBCs on 11-26-15. Hemoglobin may be slightly lower today from IVF 6.neuropathy symptoms from taxol feet>hands no worse 7.Patient does not want resuscitation or life support in irreversible situation, and understands that this malignancy is not curable. Code status DNR per my discussion with her on 12-10-15; Out of Facility DNR given that day.  8.EF good  9..morbid obesity  Appreciate help from hospitalist service, Palliative Care team and nursing. Please page me if I can be of help [phone]  [facility]

## 2015-12-16 NOTE — Progress Notes (Signed)
Discharge instructions and home medications given to patient and her son. Eulas Post, RN

## 2015-12-16 NOTE — Care Management Note (Signed)
Case Management Note  Patient Details  Name: Melissa Ball MRN: [id number] Date of Birth: 29-May-1950  Subjective/Objective: Referral for home hospice choice.Spoke to patient in rm about home hospice choice-patient chose [facility]-spoke to Amber-informed her of patient's request to have liason to call her for home visit tomorrow since anxious to go home today.Provided [facility] w/patient's contact tel#-[phone], son-Jajuan [phone].Attending to follow while under Home hospice services-Dr. Marko Plume. Patient doesn't want any dme @ the current time-will await home visit.Transp provided by family @ d/c. [facility] will contact patient for eval.                   Action/Plan:d/c home w/hospice.   Expected Discharge Date:                  Expected Discharge Plan:  Home w Hospice Care  In-House Referral:     Discharge planning Services  CM Consult  Post Acute Care Choice:    Choice offered to:  Patient  DME Arranged:    DME Agency:     HH Arranged:  RN [place] Agency:  [facility]  Status of Service:  Completed, signed off  If discussed at [place] of Stay Meetings, dates discussed:    Additional Comments:  Dessa Phi, RN 12/16/2015, 1:55 PM

## 2015-12-16 NOTE — Telephone Encounter (Signed)
Told Ms Uptegrove that Dr. Marko Plume recommends to get her flu shot prior to D/C from hospital.  Ms Starkel  Verbalized understanding.

## 2015-12-16 NOTE — Discharge Instructions (Signed)

## 2015-12-16 NOTE — Discharge Summary (Signed)
Physician Discharge Summary  WENDA VANSCHAICK LPF:[id number] DOB: 08-08-1950 DOA: 12/14/2015  PCP: Cathlean Cower, MD  Admit date: 12/14/2015 Discharge date: 12/16/2015  Recommendations for Outpatient Follow-up:  1. Pt will be discharge home with hospice and palliative care   Discharge Diagnoses:  Active Problems:   Obesity, morbid, BMI 40.0-49.9 ([place])   Metastasis to liver [facility])   [employer] Federation of Gynecology and Obstetrics (FIGO) stage IVB epithelial ovarian cancer ([place])   Intractable vomiting   Emesis  Discharge Condition: Stable  Diet recommendation: as tolerated   History of present illness:  65 y.o. 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 - Recurrent disease found by increasing CA 125 initially 08-2014 . Has progressed on doxil, taxol avastin and letrozole.  - Progression in multiple large areas in liver and large LUQ peritoneal mass by CT on admission compared with 11-26-15 - pt discussed GOC with PCT, wants to go home with hospice, comfort measures desired   Anemia associated with chronic disease, malignancy - progressive, no  clear bleeding, minimal improvement with PRBCs on 11-26-15.  - no indications for transfusions   Neuropathy symptoms  - from taxol feet>hands no worse  Obesity - Body mass index is 37.69 kg/m.   Procedures/Studies: Ct Abdomen Pelvis W Wo Contrast  Result Date: 11/26/2015 CLINICAL DATA:  Ovarian carcinoma with liver metastasis. Peritoneal metastasis. Ongoing chemotherapy. Nausea and vomiting for 2-3 weeks. Rising CA 125 EXAM: CT ABDOMEN AND PELVIS WITHOUT AND WITH CONTRAST TECHNIQUE: Multidetector CT imaging of the abdomen and pelvis was performed following the standard protocol before and following the bolus administration of intravenous contrast. CONTRAST:  180m ISOVUE-300 IOPAMIDOL (ISOVUE-300) INJECTION 61% COMPARISON:  CT 09/02/2015 FINDINGS: Lower chest: Within the RIGHT lower lobe just above the diaphragm 5 mm round nodule (image 10, series 7) is new from prior. In the posterior LEFT lower lobe 8 mm nodule adjacent to the pleural surfaces increased from 6 mm (image 37 series 7) Hepatobiliary: Marked progression of hepatic metastasis. Significantly enlarged lesions in LEFT and RIGHT hepatic lobe. Example lesion subcapsular LEFT hepatic lobe measures 6.8 cm increased from 1.5 cm. Lesion in the lateral LEFT hepatic lobe measures 6.0 cm increased from 3.2 cm. Lesion in the inferior RIGHT hepatic lobe measures 4.4 cm increased from 0.9 cm. Pancreas: Pancreas is normal. No ductal dilatation. No pancreatic inflammation. Spleen: Normal spleen Adrenals/urinary tract: RIGHT adrenal adenoma unchanged. Kidneys normal. Stomach/Bowel: Large hiatal hernia. The stomach, duodenum and small bowel are normal. No evidence of bowel obstruction. Colon and rectosigmoid colon normal without evidence obstruction. Vascular/Lymphatic: Abdominal aorta is normal caliber. New retroperitoneal periaortic lymphadenopathy. Example lymph node measures 18 mm positioned between the aorta  and IVC increased from 6 mm. Increase in LEFT  external iliac lymphadenopathy. Example lymph node measures 15 mm short axis (image 121 series 5). Adjacent 16 mm LEFT obturator node. Reproductive: Post hysterectomy.  Ovaries not identified Other: Dominant finding is massive enlargement peritoneal metastasis in the LEFT upper quadrant. Lesion measures 13 cm x 8.5 cm (image 157, series 5) increased from 4.3 x 3.7 cm. Peritoneal nodule in the deep pelvis measuring 2.8 cm increased from 1.2 cm (image 111, series 5. Musculoskeletal: Stable sclerotic lesion in the RIGHT iliac bone. IMPRESSION: 1. Marked progression of hepatic metastasis. 2. Marked progression of peritoneal metastasis now with a 13 cm LEFT upper quadrant peritoneal mass. 3. Increase in retroperitoneal and LEFT iliac lymphadenopathy. 4. Two small pulmonary nodules are new or increased from comparison exam and concerning for early pulmonary metastasis . These results will be called to the ordering clinician or representative by the Radiologist Assistant, and communication documented in the PACS or zVision Dashboard. Electronically Signed   By: Suzy Bouchard M.D.   On: 11/26/2015 11:44   Dg Chest 2 View  Result Date: 11/23/2015 CLINICAL DATA:  Metastatic ovarian cancer. Lower abdominal pain and vomiting for 2 days. EXAM: CHEST  2 VIEW COMPARISON:  01/17/2014 FINDINGS: right Port-A-Cath remains in place with tip in the SVC. Bibasilar linear densities, which could reflect scarring or atelectasis. Heart is normal size. No effusions. No acute bony abnormality. IMPRESSION: Linear densities in the lung bases, scarring versus atelectasis. Electronically Signed   By: Rolm Baptise M.D.   On: 11/23/2015 13:34   Ct Abdomen Pelvis W Contrast  Result Date: 12/15/2015 CLINICAL DATA:  Abdominal pain and vomiting. Patient with history of ovarian cancer and liver metastasis. EXAM: CT ABDOMEN AND PELVIS WITH CONTRAST TECHNIQUE: Multidetector CT imaging of the abdomen and pelvis was performed using the standard  protocol following bolus administration of intravenous contrast. CONTRAST:  12m ISOVUE-300 IOPAMIDOL (ISOVUE-300) INJECTION 61% COMPARISON:  CT 11/26/2015 FINDINGS: Lower chest: Right middle lobe nodule measures 5 mm. This is unchanged. Additional small nodule in the right middle lobe, unchanged. Chronic right middle and lower left lower lobe atelectasis. Pleural-based nodule measures 8 mm. Tiny left pleural effusion. Small pericardial nodes are unchanged. Small retrocrural periaortic nodes have increased. Hepatobiliary: Multifocal hepatic metastatic disease. Index lesion in the subcapsular right lobe measures 7.7 x 5.0 cm, previously 5.7 x 4.2 cm by my retrospective measurement. Left lobe lesion measures 6.7 cm, previously 6.8, however differences likely secondary to caliper placement. Additional multifocal lesions have also increased in size. Gallbladder decompressed, no calcified stone. Pancreas: No ductal dilatation or inflammation. Mild atrophic parenchyma. Spleen: Normal in size. Adrenals/Urinary Tract: Right adrenal nodule is unchanged. Left adrenal gland normal. Symmetric renal enhancement and excretion without hydronephrosis. Bladder is minimally distended. Stomach/Bowel: Moderate hiatal hernia. No bowel obstruction or inflammation. Increased size of peritoneal mass abutting the distal transverse and splenic flexure of the colon without bowel dilatation. Vascular/Lymphatic: Multifocal retroperitoneal adenopathy. For example lower aortocaval node measures 18 mm, unchanged. Left external iliac adenopathy, example lymph node measures 18 mm, previously 15. Reproductive: Post hysterectomy. Other: Interval enlargement of left upper quadrant peritoneal metastatic deposit. Current measurements 17.9 x 10.8 cm cabinet previously 13 x 8.4 cm. No evidence of necrosis or surrounding inflammation. Adjacent mesenteric nodules. Deep pelvic nodule also increased currently 3.7 cm, previously 2.8 cm. Small volume of free  fluid in the pelvis. No free air. No loculated fluid collection. Musculoskeletal: Stable right sclerotic iliac bone lesion. No new osseous  abnormality. IMPRESSION: 1. Progressive intra-abdominal metastatic disease. 2. Large left upper quadrant peritoneal metastasis has increased in size, currently 18 cm. This abuts the transverse and splenic flexure of colon without obstruction or bowel dilatation. 3. Progressive retroperitoneal and left iliac adenopathy. Increased size of the deep pelvic peritoneal nodule. 4. Multifocal hepatic metastatic disease with mild progression. 5. Pulmonary nodules are unchanged. Electronically Signed   By: Jeb Levering M.D.   On: 12/15/2015 02:19   Dg Abd 2 Views  Result Date: 11/23/2015 CLINICAL DATA:  Metastatic ovarian cancer. Lower abdominal pain and vomiting for 2 days. EXAM: ABDOMEN - 2 VIEW COMPARISON:  CT 09/02/2015 FINDINGS: Nonobstructive bowel gas pattern. No free air organomegaly. No suspicious calcification. Degenerative changes in the lumbar spine and hips. Linear densities in the lung bases, likely atelectasis. IMPRESSION: No evidence of bowel obstruction or free air. Bibasilar atelectasis. Electronically Signed   By: Rolm Baptise M.D.   On: 11/23/2015 13:32   Dg Abd Acute W/chest  Result Date: 12/14/2015 CLINICAL DATA:  Nausea vomiting and mid abdominal pain since yesterday. Ovarian cancer. EXAM: DG ABDOMEN ACUTE W/ 1V CHEST COMPARISON:  CT scan 11/26/2015.  X-rays from 11/23/2015. FINDINGS: No edema or focal airspace consolidation. Probable tiny bilateral pleural effusions noted. Cardiopericardial silhouette is at upper limits of normal for size. Right Port-A-Cath remains in place with tip overlying the mid SVC level. Upright film shows no evidence for intraperitoneal free air. Supine film shows no gaseous bowel dilatation suggest obstruction with relatively gasless small bowel. Degenerative changes noted in lumbar spine and bilateral hips, left greater than  right. IMPRESSION: 1. Tiny bilateral pleural effusions. 2. No evidence for intraperitoneal free air or bowel obstruction. Electronically Signed   By: Misty Stanley M.D.   On: 12/14/2015 10:32     Discharge Exam: Vitals:   12/15/15 2100 12/16/15 0436  BP: 122/89 123/84  Pulse: (!) 127 (!) 117  Resp: 18 20  Temp: 99.8 F (37.7 C) 98.7 F (37.1 C)   Vitals:   12/15/15 1500 12/15/15 1547 12/15/15 2100 12/16/15 0436  BP: 121/76  122/89 123/84  Pulse: (!) 133 (!) 116 (!) 127 (!) 117  Resp: _0 Temp: 99.1 F (37.3 C)  99.8 F (37.7 C) 98.7 F (37.1 C)  TempSrc: Oral  Oral Oral  SpO2: 98%  98% 99%  Weight:      Height:        General: Pt is alert, follows commands appropriately, not in acute distress Cardiovascular: Regular rhythm, tachycardic, S1/S2 +, no murmurs, no rubs, no gallops Respiratory: Clear to auscultation bilaterally, no wheezing, no crackles, no rhonchi Abdominal: Soft, non tender, non distended, bowel sounds +, no guarding   Discharge Instructions     Medication List    TAKE these medications   acetaminophen 500 MG tablet Commonly known as:  TYLENOL Take 500 mg by mouth every 6 (six) hours as needed for mild pain. Reported on 09/03/2015   baclofen 10 MG tablet Commonly known as:  LIORESAL Take 0.5 tablets (5 mg total) by mouth 3 (three) times daily as needed (hiccups).   GAS-X EXTRA STRENGTH 125 MG chewable tablet Generic drug:  simethicone Chew 125 mg by mouth every 6 (six) hours as needed for flatulence.   HYDROcodone-acetaminophen 5-325 MG tablet Commonly known as:  NORCO/VICODIN Take 1 tablet every 4-6 hours as needed for pain. What changed:  how much to take  how to take this  when to take this  reasons to  take this  additional instructions   lidocaine-prilocaine cream Commonly known as:  EMLA Apply 1 application topically See admin instructions. Apply to Porta-Cath 1-2 hours prior to access as directed.   LORazepam 1 MG  tablet Commonly known as:  ATIVAN Take 1 tablet by mouth or place under tongue to dissolve every 6 hours as needed for nausea or to relax. Will make you drowsy. What changed:  how much to take  how to take this  when to take this  reasons to take this  additional instructions   MIRALAX powder Generic drug:  polyethylene glycol powder Take 17 g by mouth daily as needed for mild constipation. Reported on 09/03/2015   multivitamin capsule Take 1 capsule by mouth every other day. One-a-Day Proactive 65+   ondansetron 8 MG disintegrating tablet Commonly known as:  ZOFRAN ODT 60m ODT q4 hours prn nausea What changed:  how much to take  how to take this  when to take this  reasons to take this  additional instructions   pantoprazole 40 MG tablet Commonly known as:  PROTONIX TAKE 1 TABLET (40 MG TOTAL) BY MOUTH DAILY.   sucralfate 1 g tablet Commonly known as:  CARAFATE Take 1 tablet (1 g total) by mouth 4 (four) times daily -  with meals and at bedtime.      Follow-up Information    JCathlean Cower MD .   Specialties:  Internal Medicine, Radiology Contact information: [address] [phone]           The results of significant diagnostics from this hospitalization (including imaging, microbiology, ancillary and laboratory) are listed below for reference.     Microbiology: Recent Results (from the past 240 hour(s))  TECHNOLOGIST REVIEW     Status: None   Collection Time: 12/10/15  8:57 AM  Result Value Ref Range Status   Technologist Review Rare metamyelocyte and nRBC, rouleaux present  Final     Labs: Basic Metabolic Panel:  Recent Labs Lab 12/10/15 0857 12/14/15 0950 12/15/15 0147  NA 138 137 140  K 3.4* 3.4* 3.1*  CL  --  100* 101  CO2 _0 GLUCOSE 86 92 94  BUN 9._1 CREATININE 0.7 0.77 0.88  CALCIUM 9.1 9.2 8.5*   Liver Function Tests:  Recent Labs Lab 12/10/15 0857 12/14/15 0950  12/15/15 0147  AST 45* 49* 42*  ALT _2 ALKPHOS 149 149* 126  BILITOT 0.50 0.6 0.6  PROT 7.4 7.7 7.0  ALBUMIN 2.1* 2.6* 2.3*    Recent Labs Lab 12/14/15 0950  LIPASE 15   CBC:  Recent Labs Lab 12/10/15 0857 12/14/15 0950 12/15/15 0147  WBC 13.5* 17.4* 14.5*  NEUTROABS 10.9*  --  12.5*  HGB 8.6* 8.9* 8.8*  HCT 26.9* 27.6* 27.3*  MCV 78.4* 78.9 77.1*  PLT 439* 533* 419*   SIGNED: Time coordinating discharge: 30 minutes  MAGICK-, , MD  [facility] 12/16/2015, 12:23 PM Pager [phone] If 7PM-7AM, please contact night-coverage [email or web address] Password [email or web address]

## 2015-12-21 ENCOUNTER — Telehealth: Payer: Self-pay

## 2015-12-21 NOTE — Telephone Encounter (Signed)
Melissa Ball called stating that  She saw Melissa Ball today. Hr BP was 140/70 and HR was 140-150 at rest. Does Dr. Marko Plume want to put patient on a Beta Blocker?

## 2015-12-21 NOTE — Telephone Encounter (Signed)
Spoke with Stanton Kidney and told her that Dr. Marko Plume does not want to order a Beta blocker.  She said to encourage po fluids.

## 2015-12-22 ENCOUNTER — Telehealth: Payer: Self-pay

## 2015-12-22 NOTE — Telephone Encounter (Signed)
LM for Melissa Ball to fax FMLA Papers to Dr. Mariana Kaufman Nurse who will in turn give them to Managed care to complete. Appologized that managed care did not call sooner with a fax number to send FMLA forms. Stated that it would be best for him to call the [facility] main number [phone] number in the future as this phone line is not monitored all the time, especially on weekends.

## 2015-12-23 ENCOUNTER — Telehealth: Payer: Self-pay | Admitting: *Deleted

## 2015-12-23 ENCOUNTER — Encounter: Payer: Self-pay | Admitting: Oncology

## 2015-12-23 NOTE — Progress Notes (Signed)
Medical Oncology  Note from [facility] triage: Hospice nurse Violeta Gelinas left voicemail requesting to resume lisinopril, HCTZ combination 20/25 mg medicine for this patient.   Called, spoke with this nurse.  "Patient took this medicine on her own accord Monday and Tuesday.  She feels better and has tolerated it well.  Monday heart rate = 150's to 160's.  Today B/P = 130/70- heart rate = 127.  O2 sat = 98% on room air.  Lungs with scattered soft diffuse rhonchi.  Negative JVD.  Has some lower extremity swelling which is much better.  No N/V.  Return number [phone]."  MD returned call to Hospice RN. Discussed that decreasing BP / diuretic will not help tachycardia. Patient's concern seems mostly swelling in legs, better in AMs and increased when she is up. Recommended staying off lisinopril HCTZ, elevating legs, Teds if possible. RN understands and will follow up with patient.  Godfrey Pick, MD

## 2015-12-23 NOTE — Telephone Encounter (Signed)
Hospice nurse Violeta Gelinas left voicemail requesting to resume lisinopril, HCTZ combination 20/25 mg medicine for this patient.   Called, spoke with this nurse.  "Patient took this medicine on her own accord Monday and Tuesday.  She feels better and has tolerated it well.  Monday heart rate = 150's to 160's.  Today B/P = 130/70- heart rate = 127.  O2 sat = 98% on room air.  Lungs with scattered soft diffuse rhonchi.  Negative JVD.  Has some lower extremity swelling which is much better.  No N/V.  Return number [phone]."

## 2015-12-23 NOTE — Telephone Encounter (Signed)
received FMLA papers to fill out. Placed on Dr. Mariana Kaufman desk to complete.

## 2015-12-23 NOTE — Telephone Encounter (Signed)
Told Mr. Grantham that the FMLA papers were received.

## 2015-12-28 ENCOUNTER — Telehealth: Payer: Self-pay

## 2015-12-28 NOTE — Telephone Encounter (Signed)
Told Melissa Ball that the completed forms were faxed to his Employer and himself as he requested.on 12-24-15. He will follow up with his employer.  He will call back if there is any issues with reception of forms.

## 2015-12-29 ENCOUNTER — Telehealth: Payer: Self-pay

## 2015-12-29 DIAGNOSIS — C569 Malignant neoplasm of unspecified ovary: Secondary | ICD-10-CM

## 2015-12-29 NOTE — Telephone Encounter (Signed)
  Markela, [place] Female, 65 y.o., 1950/11/27 Weight:  219 lb 9.3 oz (99.6 kg) Phone:  [phone] PCP:  Biagio Borg, MD [place] MRN:  [id number] MyChart:  Pending Next Appt:  01/11/2016  Message  Received: Today  Message Contents  Gordy Levan, MD  Baruch Merl, RN          Note re Hospice concerns seen:   Stanton Kidney was calling concerning Ms Knowles's BP and HR yesterday at visit.  Sitting BP was 188/118  HR= 142  Standing BP was 160/90  HR =160.  Ms Stepney not taking Lisinopril HCTZ.  LE edema mildly pitting. Patient does not use Ted hose often.  Mary concerned about HR and possibly pt in an arhythmia.   Would a Beta blocker such as lopressor or atenolol help her BP and HR?   Suggest either PCP see, or ED to evaulate the tachycardia, or [facility] Symptom Management

## 2015-12-29 NOTE — Telephone Encounter (Signed)
Stanton Kidney was calling concerning Melissa Ball's BP and HR yesterday at visit. Sitting BP was 188/118   HR= 142  Standing BP was 160/90   HR =160.  Melissa Farell not taking Lisinopril HCTZ. LE edema mildly pitting.  Patient does not use Ted hose often. Melissa Ball concerned about HR and possibly pt in an arhythmia.   Would a Beta blocker such as lopressor or atenolol help her  BP and HR?

## 2015-12-29 NOTE — Telephone Encounter (Signed)
Spoke with Dr. Marko Plume about Melissa Ball wanting to see  Dr. Marko Plume while son in town. Dr. Marko Plume said for her to keep appointment tomorrow with symptom management clinic and make an appointment  For visit on Thursday with her at 4 pm.  Spoke with sister Melissa Ball and gave her the appointment time for thursday and to keep appointment tomorrow with [facility]  as scheduled.   Melissa Ball very appreciative.

## 2015-12-29 NOTE — Telephone Encounter (Signed)
LM for Stanton Kidney, RN with Hospice to call this nurse back regarding Dr. Mariana Kaufman recommendations as noted below.

## 2015-12-29 NOTE — Telephone Encounter (Signed)
Melissa Ball's sister Melissa Ball called ([phone]) and stated that the Hospice nurse Melissa Ball said to call Dr. Mariana Kaufman office to be seen in  the symptom management clinic. Reviewed with symptom management and sent urgent POF for Melissa Ball to be seen in clinic tomorrow 12-30-15. Labs ordered.

## 2015-12-30 ENCOUNTER — Other Ambulatory Visit: Payer: Self-pay | Admitting: Oncology

## 2015-12-30 ENCOUNTER — Other Ambulatory Visit (HOSPITAL_BASED_OUTPATIENT_CLINIC_OR_DEPARTMENT_OTHER)

## 2015-12-30 ENCOUNTER — Other Ambulatory Visit: Payer: Self-pay

## 2015-12-30 ENCOUNTER — Encounter: Payer: Self-pay | Admitting: Nurse Practitioner

## 2015-12-30 ENCOUNTER — Ambulatory Visit (HOSPITAL_BASED_OUTPATIENT_CLINIC_OR_DEPARTMENT_OTHER): Payer: Medicare Other | Admitting: Nurse Practitioner

## 2015-12-30 VITALS — BP 138/92 | HR 139 | Temp 98.5°F | Resp 18 | Ht 64.0 in | Wt 221.3 lb

## 2015-12-30 DIAGNOSIS — R3911 Hesitancy of micturition: Secondary | ICD-10-CM

## 2015-12-30 DIAGNOSIS — T451X5A Adverse effect of antineoplastic and immunosuppressive drugs, initial encounter: Secondary | ICD-10-CM

## 2015-12-30 DIAGNOSIS — D6481 Anemia due to antineoplastic chemotherapy: Secondary | ICD-10-CM

## 2015-12-30 DIAGNOSIS — R000 Tachycardia, unspecified: Secondary | ICD-10-CM | POA: Diagnosis not present

## 2015-12-30 DIAGNOSIS — C569 Malignant neoplasm of unspecified ovary: Secondary | ICD-10-CM

## 2015-12-30 DIAGNOSIS — R609 Edema, unspecified: Secondary | ICD-10-CM | POA: Diagnosis not present

## 2015-12-30 LAB — COMPREHENSIVE METABOLIC PANEL
ALBUMIN: 2.1 g/dL — AB (ref 3.5–5.0)
ALT: 14 U/L (ref 0–55)
AST: 45 U/L — AB (ref 5–34)
Alkaline Phosphatase: 271 U/L — ABNORMAL HIGH (ref 40–150)
Anion Gap: 16 mEq/L — ABNORMAL HIGH (ref 3–11)
BUN: 21.9 mg/dL (ref 7.0–26.0)
CALCIUM: 9.5 mg/dL (ref 8.4–10.4)
CHLORIDE: 103 meq/L (ref 98–109)
CO2: 19 meq/L — AB (ref 22–29)
CREATININE: 0.9 mg/dL (ref 0.6–1.1)
EGFR: 80 mL/min/{1.73_m2} — ABNORMAL LOW (ref >90)
GLUCOSE: 85 mg/dL (ref 70–140)
POTASSIUM: 3.9 meq/L (ref 3.5–5.1)
Sodium: 138 mEq/L (ref 136–145)
TOTAL PROTEIN: 7.4 g/dL (ref 6.4–8.3)
Total Bilirubin: 0.35 mg/dL (ref 0.20–1.20)

## 2015-12-30 LAB — CBC WITH DIFFERENTIAL/PLATELET
BASO%: 0.1 % (ref 0.0–2.0)
BASOS ABS: 0 10*3/uL (ref 0.0–0.1)
EOS%: 0 % (ref 0.0–7.0)
Eosinophils Absolute: 0 10*3/uL (ref 0.0–0.5)
HEMATOCRIT: 25.4 % — AB (ref 34.8–46.6)
HEMOGLOBIN: 8 g/dL — AB (ref 11.6–15.9)
LYMPH#: 1 10*3/uL (ref 0.9–3.3)
LYMPH%: 5.2 % — ABNORMAL LOW (ref 14.0–49.7)
MCH: 24 pg — AB (ref 25.1–34.0)
MCHC: 31.5 g/dL (ref 31.5–36.0)
MCV: 76.3 fL — ABNORMAL LOW (ref 79.5–101.0)
MONO#: 1 10*3/uL — AB (ref 0.1–0.9)
MONO%: 5.3 % (ref 0.0–14.0)
NEUT#: 17 10*3/uL — ABNORMAL HIGH (ref 1.5–6.5)
NEUT%: 89.4 % — AB (ref 38.4–76.8)
NRBC: 1 % — AB (ref 0–0)
Platelets: 469 10*3/uL — ABNORMAL HIGH (ref 145–400)
RBC: 3.33 10*6/uL — ABNORMAL LOW (ref 3.70–5.45)
RDW: 19.8 % — ABNORMAL HIGH (ref 11.2–14.5)
WBC: 19 10*3/uL — ABNORMAL HIGH (ref 3.9–10.3)

## 2015-12-30 LAB — TECHNOLOGIST REVIEW: Technologist Review: 1

## 2015-12-30 MED ORDER — METOPROLOL SUCCINATE ER 25 MG PO TB24: 25.0000 mg | ORAL_TABLET | Freq: Every day | ORAL | 0 refills | Status: AC

## 2015-12-30 NOTE — Assessment & Plan Note (Signed)
Labs drawn today reveal hemoglobin down to 8.0.  Patient does complain of increased anemia; but only.  No shortness of breath when she has palpitations.  Reviewed lab findings with Dr. Marko Plume today; and she recommended no further action until she sees the patient tomorrow afternoon.

## 2015-12-30 NOTE — Assessment & Plan Note (Signed)
Patient states that she continues with chronic tachycardia.  She states that she can feel when her heart rate speeds up;  And she becomes short of breath at times when she experiences these palpitations.  She denies any actual chest pain, chest pressure, or pain with inspiration.  Exam today reveals patient tachycardic with initial heart rate check of 139.  Blood pressure was mildly elevated at 138/92.  EKG obtained today revealed sinus tachycardia with a rate of 125 and a QTC of 450.  Review of old/previous EKGs revealed same tachycardia.  Patient appears in no acute distress whatsoever.  Reviewed all findings with Dr. Marko Plume; and she recommended that patient continue to hold the lisinopril and HCTZ.  Instead, she recommended Toprol-XL 25 mg on a daily basis.   Patient was advised to begin taking the medications today if possible.  Patient was advised to call/return or go directly to the emergency department for any worsening symptoms whatsoever.

## 2015-12-30 NOTE — Assessment & Plan Note (Signed)
Patient denies any dysuria or obvious hematuria; but does complain of some urinary hesitancy.  Patient was unable to give a urine sample this afternoon; but was given a collection kit for gathering of her urine.  She has plans to return to the [facility] tomorrow afternoon to meet with Dr. Marko Plume; and states that she will bring the urine sample at that time.

## 2015-12-30 NOTE — Progress Notes (Signed)
Telephone   12/29/2015 [facility]  Baruch Merl, RN   Ovarian cancer, unspecified laterality [facility])  Dx   Advice Only   Reason for call   Conversation: Advice Only  (Newest Message First)  Me  to Melissa Borders, NP . Melissa Hoard, RN       12/29/15 4:53 PM   FYI appointment also for Thursday at 4pm with Dr Marko Ball.  Me      12/29/15 4:46 PM  Note    Spoke with Melissa Ball about Melissa Ball wanting to see  Melissa Ball while son in town. Melissa Ball said for her to keep appointment tomorrow with symptom management clinic and make an appointment  For visit on Thursday with her at 4 pm.  Spoke with sister Melissa Ball and gave her the appointment time for thursday and to keep appointment tomorrow with [facility]  as scheduled.   Melissa Ball very appreciative.          12/29/15 3:59 PM  You routed this conversation to Melissa Borders, NP . Melissa Hoard, RN   Me      12/29/15 3:54 PM  Note    Melissa Weinmann's sister Maurilio Ball called ([phone]) and stated that the Hospice nurse Melissa Ball said to call Melissa Ball office to be seen in  the symptom management clinic. Reviewed with symptom management and sent urgent POF for Melissa zembower to be seen in clinic tomorrow 12-30-15. Labs ordered.          12/29/15 11:59 AM    You attempted to contact Melissa Ball [facility]) (Left Message)  Me      12/29/15 11:58 AM  Note    LM for Melissa Kidney, RN with Hospice to call this nurse back regarding Melissa Ball recommendations as noted below.    Me      12/29/15 11:39 AM  Note     Melissa Ball, Melissa Ball Female, 65 y.o., [birth year] Weight:  219 lb 9.3 oz (99.6 kg) Phone:  [phone] PCP:  Biagio Borg, Melissa Ball Percival MRN:  [id number] MyChart:  Pending Next Appt:  01/11/2016  Message  Received: Today  Message Contents  Melissa Levan, Melissa Ball  Baruch Merl, RN          Note re Hospice concerns seen:   Melissa Ball was calling  concerning Melissa Rance's BP and HR yesterday at visit.  Sitting BP was 188/118  HR= 142  Standing BP was 160/90  HR =160.  Melissa Ayotte not taking Lisinopril HCTZ.  LE edema mildly pitting. Patient does not use Ted hose often.  Mary concerned about HR and possibly pt in an arhythmia.   Would a Beta blocker such as lopressor or atenolol help her BP and HR?   Suggest either PCP see, or ED to evaulate the tachycardia, or [facility] Symptom Management                12/29/15 11:27 AM  You routed this conversation to Melissa Levan, Melissa Ball   Me      12/29/15 11:17 AM  Note    Melissa Ball was calling concerning Melissa Seder's BP and HR yesterday at visit. Sitting BP was 188/118   HR= 142  Standing BP was 160/90   HR =160.  Melissa Gilroy not taking Lisinopril HCTZ. LE edema mildly pitting.  Patient does not use Ted hose often. Mary concerned about HR and possibly pt in an arhythmia.   Would a Beta blocker such  as lopressor or atenolol help her  BP and HR?          12/29/15 11:12 AM    Melissa Ball [facility]) contacted Me  Additional Documentation   Encounter Info:   Billing Info,   History,   Allergies,   Detailed Report     Linked Episodes    ONCOLOGY TREATMENT Noted 10/05/2013    Orders Placed      CBC with Differential     Comprehensive metabolic panel    All Encounter Results  Medication Renewals and Changes     None    Medication List  Visit Diagnoses      Ovarian cancer, unspecified laterality ([place])    Problem List

## 2015-12-30 NOTE — Progress Notes (Signed)
SYMPTOM MANAGEMENT CLINIC    Chief Complaint:  Tachycardia, lymphedema  HPI:  Melissa Ball 65 y.o. female diagnosed with , ovarian cancer with liver metastasis.  Currently undergoing observation only and under hospice care.   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 14cm adnexal mass.  Significant radiographic and chemical response to neoadjuvant platinum and taxane neoadjuvant chemotherapy x 4 cycles.  Interval cytoreduction to microscopic disease on 01/21/14. Pathology was most consistent with ovarian primary.     Endometrial cancer determined by uterine biopsy ([place]) (Resolved)   09/16/2013 Initial Diagnosis    Endometrial cancer determined by uterine biopsy, and endocervical biopsy       Ovarian cancer ([place])   09/23/2013 Initial Diagnosis    Ovarian cancer      10/03/2013 - 01/04/2014 Neo-Adjuvant Chemotherapy    dose dense paclitaxel and carboplatin x 4 cycles      01/21/2014 Surgery    robotic hysterectomy, BSO, omentectomy, interval cytoreduction to microscopic disease       Review of Systems  Constitutional: Positive for malaise/fatigue.  Cardiovascular: Positive for leg swelling.  Genitourinary:       Urinary hesitancy  All other systems reviewed and are negative.   Past Medical History:  Diagnosis Date  . Arthritis   . GERD (gastroesophageal reflux disease)   . History of transfusion   . Hyperlipidemia   . Hypertension   . Ovarian cancer [facility])     Past Surgical History:  Procedure Laterality Date  . JOINT REPLACEMENT  2012   L KNEE  . PORTACATH PLACEMENT    . ROBOTIC ASSISTED TOTAL HYSTERECTOMY WITH BILATERAL SALPINGO OOPHERECTOMY N/A 01/21/2014   Procedure: ROBOTIC ASSISTED TOTAL HYSTERECTOMY WITH BILATERAL SALPINGO OOPHORECTOMY with omentectomy;  Surgeon: Everitt Amber, MD;  Location: [facility]S;  Service: Gynecology;  Laterality: N/A;    has Hypertension; Hyperlipidemia;  Preventative health care; GERD (gastroesophageal reflux disease); Right shoulder pain; Unspecified constipation; Osteoarthritis, shoulder; Ovarian cancer ([place]); S/P total abdominal hysterectomy and bilateral salpingo-oophorectomy; Obesity, morbid, BMI 40.0-49.9 ([place]); Port catheter in place; Peripheral neuropathy due to chemotherapy ([place]); Antineoplastic chemotherapy induced anemia; Portacath in place; High risk medication use; Peripheral edema; Family history of breast cancer in sister; Family history of colon cancer in mother; Anemia due to antineoplastic chemotherapy; Metastasis to liver [facility]); [employer] Federation of Gyne[employer] and Obstetrics (FIGO) stage IVB epithelial ovarian cancer ([place]); Genetic testing; Other secondary hypertension; DNR (do not resuscitate); Cancer associated pain; Intractable vomiting; Emesis; Tachycardia; and Urinary hesitancy on her problem list.    has No Known Allergies.    Medication List       Accurate as of 12/30/15  5:35 PM. Always use your most recent med list.          acetaminophen 500 MG tablet Commonly known as:  TYLENOL Take 500 mg by mouth every 6 (six) hours as needed for mild pain. Reported on 09/03/2015   baclofen 10 MG tablet Commonly known as:  LIORESAL Take 0.5 tablets (5 mg total) by mouth 3 (three) times daily as needed (hiccups).   GAS-X EXTRA STRENGTH 125 MG chewable tablet Generic drug:  simethicone Chew 125 mg by mouth every 6 (six) hours as needed for flatulence.   HYDROcodone-acetaminophen 5-325 MG tablet Commonly known as:  NORCO/VICODIN Take 1 tablet every 4-6 hours as needed for pain.   lidocaine-prilocaine cream Commonly known as:  EMLA Apply 1 application topically See admin instructions. Apply to Porta-Cath 1-2 hours  prior to access as directed.   LORazepam 1 MG tablet Commonly known as:  ATIVAN Take 1 tablet by mouth or place under tongue to dissolve every 6 hours as needed for nausea or to relax. Will make you  drowsy.   metoprolol succinate 25 MG 24 hr tablet Commonly known as:  TOPROL XL Take 1 tablet (25 mg total) by mouth daily.   MIRALAX powder Generic drug:  polyethylene glycol powder Take 17 g by mouth daily as needed for mild constipation. Reported on 09/03/2015   multivitamin capsule Take 1 capsule by mouth every other day. One-a-Day Proactive 65+   ondansetron 8 MG disintegrating tablet Commonly known as:  ZOFRAN ODT 86m ODT q4 hours prn nausea   pantoprazole 40 MG tablet Commonly known as:  PROTONIX TAKE 1 TABLET (40 MG TOTAL) BY MOUTH DAILY.   sucralfate 1 g tablet Commonly known as:  CARAFATE Take 1 tablet (1 g total) by mouth 4 (four) times daily -  with meals and at bedtime.        PHYSICAL EXAMINATION  Oncology Vitals 12/30/2015 12/16/2015  Height 163 cm -  Weight 100.381 kg -  Weight (lbs) 221 lbs 5 oz -  BMI (kg/m2) 37.99 kg/m2 -  Temp 98.5 98.7  Pulse 139 117  Resp 18 20  SpO2 99 99  BSA (m2) 2.13 m2 -   BP Readings from Last 2 Encounters:  12/30/15 (!) 138/92  12/16/15 123/84    Physical Exam  Constitutional: She is oriented to person, place, and time and well-developed, well-nourished, and in no distress.  HENT:  Head: Normocephalic and atraumatic.  Mouth/Throat: Oropharynx is clear and moist.  Eyes: Conjunctivae and EOM are normal. Pupils are equal, round, and reactive to light. Right eye exhibits no discharge. Left eye exhibits no discharge. No scleral icterus.  Neck: Normal range of motion. Neck supple. No JVD present. No tracheal deviation present. No thyromegaly present.  Cardiovascular: Normal rate, regular rhythm, normal heart sounds and intact distal pulses.   Pulmonary/Chest: Effort normal and breath sounds normal. No respiratory distress. She has no wheezes. She has no rales. She exhibits no tenderness.  Abdominal: Soft. Bowel sounds are normal. She exhibits no distension and no mass. There is no tenderness. There is no rebound and no  guarding.  Musculoskeletal: Normal range of motion. She exhibits edema. She exhibits no tenderness.  +3 edema to bilateral legs  Lymphadenopathy:    She has no cervical adenopathy.  Neurological: She is alert and oriented to person, place, and time. Gait normal.  Skin: Skin is warm and dry. No rash noted. No erythema. No pallor.  Psychiatric: Affect normal.  Nursing note and vitals reviewed.   LABORATORY DATA:. Appointment on 12/30/2015  Component Date Value Ref Range Status  . WBC 12/30/2015 19.0* 3.9 - 10.3 10e3/uL Final  . NEUT# 12/30/2015 17.0* 1.5 - 6.5 10e3/uL Final  . HGB 12/30/2015 8.0* 11.6 - 15.9 g/dL Final  . HCT 12/30/2015 25.4* 34.8 - 46.6 % Final  . Platelets 12/30/2015 469* 145 - 400 10e3/uL Final  . MCV 12/30/2015 76.3* 79.5 - 101.0 fL Final  . MCH 12/30/2015 24.0* 25.1 - 34.0 pg Final  . MCHC 12/30/2015 31.5  31.5 - 36.0 g/dL Final  . RBC 12/30/2015 3.33* 3.70 - 5.45 10e6/uL Final  . RDW 12/30/2015 19.8* 11.2 - 14.5 % Final  . lymph# 12/30/2015 1.0  0.9 - 3.3 10e3/uL Final  . MONO# 12/30/2015 1.0* 0.1 - 0.9 10e3/uL Final  . Eosinophils  Absolute 12/30/2015 0.0  0.0 - 0.5 10e3/uL Final  . Basophils Absolute 12/30/2015 0.0  0.0 - 0.1 10e3/uL Final  . NEUT% 12/30/2015 89.4* 38.4 - 76.8 % Final  . LYMPH% 12/30/2015 5.2* 14.0 - 49.7 % Final  . MONO% 12/30/2015 5.3  0.0 - 14.0 % Final  . EOS% 12/30/2015 0.0  0.0 - 7.0 % Final  . BASO% 12/30/2015 0.1  0.0 - 2.0 % Final  . nRBC 12/30/2015 1* 0 - 0 % Final  . Sodium 12/30/2015 138  136 - 145 mEq/L Final  . Potassium 12/30/2015 3.9  3.5 - 5.1 mEq/L Final  . Chloride 12/30/2015 103  98 - 109 mEq/L Final  . CO2 12/30/2015 19* 22 - 29 mEq/L Final  . Glucose 12/30/2015 85  70 - 140 mg/dl Final  . BUN 12/30/2015 21.9  7.0 - 26.0 mg/dL Final  . Creatinine 12/30/2015 0.9  0.6 - 1.1 mg/dL Final  . Total Bilirubin 12/30/2015 0.35  0.20 - 1.20 mg/dL Final  . Alkaline Phosphatase 12/30/2015 271* 40 - 150 U/L Final  . AST  12/30/2015 45* 5 - 34 U/L Final  . ALT 12/30/2015 14  0 - 55 U/L Final  . Total Protein 12/30/2015 7.4  6.4 - 8.3 g/dL Final  . Albumin 12/30/2015 2.1* 3.5 - 5.0 g/dL Final  . Calcium 12/30/2015 9.5  8.4 - 10.4 mg/dL Final  . Anion Gap 12/30/2015 16* 3 - 11 mEq/L Final  . EGFR 12/30/2015 80* >90 ml/min/1.73 m2 Final  . Technologist Review 12/30/2015  1  %nRBC   Final    RADIOGRAPHIC STUDIES: No results found.  ASSESSMENT/PLAN:    Urinary hesitancy Patient denies any dysuria or obvious hematuria; but does complain of some urinary hesitancy.  Patient was unable to give a urine sample this afternoon; but was given a collection kit for gathering of her urine.  She has plans to return to the [place] tomorrow afternoon to meet with Dr. Marko Plume; and states that she will bring the urine sample at that time.  Tachycardia   Patient states that she continues with chronic tachycardia.  She states that she can feel when her heart rate speeds up;  And she becomes short of breath at times when she experiences these palpitations.  She denies any actual chest pain, chest pressure, or pain with inspiration.  Exam today reveals patient tachycardic with initial heart rate check of 139.  Blood pressure was mildly elevated at 138/92.  EKG obtained today revealed sinus tachycardia with a rate of 125 and a QTC of 450.  Review of old/previous EKGs revealed same tachycardia.  Patient appears in no acute distress whatsoever.  Reviewed all findings with Dr. Marko Plume; and she recommended that patient continue to hold the lisinopril and HCTZ.  Instead, she recommended Toprol-XL 25 mg on a daily basis.   Patient was advised to begin taking the medications today if possible.  Patient was advised to call/return or go directly to the emergency department for any worsening symptoms whatsoever.   Peripheral edema  Patient has a history of chronic peripheral edema to her bilateral lower legs.  She states that she  has noticed increased edema to her legs recently.  Exam today reveal +3 edema to bilateral lower extremities.  Discussed with both her family member and  Patient that this may be fluid overload; but could lymphedema secondary to her disease.  Patient was advised to elevate her feet above the level of for heart when she is at  rest.  She was also given a prescription for compression stockings as well.  Dr. Marko Plume will be seeing the patient tomorrow afternoon for further evaluation.  Ovarian cancer [facility])  Patient is status post chemotherapy and radiation therapy in the past.  She is currently undergoing observation only; and is currently under hospice care with a home health nurse. Patient has plans to return for follow-up visit with Dr. Marko Plume tomorrow afternoon.  12/31/2015.  She is also scheduled for labs, flush, and a visit on 01/11/2016.  Anemia due to antineoplastic chemotherapy   Labs drawn today reveal hemoglobin down to 8.0.  Patient does complain of increased anemia; but only.  No shortness of breath when she has palpitations.  Reviewed lab findings with Dr. Marko Plume today; and she recommended no further action until she sees the patient tomorrow afternoon.   Patient stated understanding of all instructions; and was in agreement with this plan of care. The patient knows to call the clinic with any problems, questions or concerns.   Total time spent with patient was 40 minutes;  with greater than 75 percent of that time spent in face to face counseling regarding patient's symptoms,  and coordination of care and follow up.  Disclaimer:This dictation was prepared with Dragon/digital dictation along with [employer]. Any transcriptional errors that result from this process are unintentional.  Drue Second, NP 12/30/2015

## 2015-12-30 NOTE — Assessment & Plan Note (Signed)
Patient is status post chemotherapy and radiation therapy in the past.  She is currently undergoing observation only; and is currently under hospice care with a home health nurse. Patient has plans to return for follow-up visit with Dr. Marko Plume tomorrow afternoon.  12/31/2015.  She is also scheduled for labs, flush, and a visit on 01/11/2016.

## 2015-12-30 NOTE — Assessment & Plan Note (Signed)
Patient has a history of chronic peripheral edema to her bilateral lower legs.  She states that she has noticed increased edema to her legs recently.  Exam today reveal +3 edema to bilateral lower extremities.  Discussed with both her family member and  Patient that this may be fluid overload; but could lymphedema secondary to her disease.  Patient was advised to elevate her feet above the level of for heart when she is at rest.  She was also given a prescription for compression stockings as well.  Dr. Marko Plume will be seeing the patient tomorrow afternoon for further evaluation.

## 2015-12-31 ENCOUNTER — Encounter: Payer: Self-pay | Admitting: Oncology

## 2015-12-31 ENCOUNTER — Ambulatory Visit (HOSPITAL_BASED_OUTPATIENT_CLINIC_OR_DEPARTMENT_OTHER): Payer: Medicare Other | Admitting: Oncology

## 2015-12-31 VITALS — BP 145/93 | HR 133 | Temp 98.0°F | Resp 19 | Wt 224.4 lb

## 2015-12-31 DIAGNOSIS — C569 Malignant neoplasm of unspecified ovary: Secondary | ICD-10-CM | POA: Diagnosis present

## 2015-12-31 DIAGNOSIS — C787 Secondary malignant neoplasm of liver and intrahepatic bile duct: Secondary | ICD-10-CM | POA: Diagnosis not present

## 2015-12-31 DIAGNOSIS — R000 Tachycardia, unspecified: Secondary | ICD-10-CM

## 2015-12-31 DIAGNOSIS — Z66 Do not resuscitate: Secondary | ICD-10-CM

## 2015-12-31 NOTE — Progress Notes (Signed)
OFFICE PROGRESS NOTE   December 31, 2015   Physicians:Emma Rossi,John, Hunt Oris, MD , Aloha Gell  INTERVAL HISTORY:  [name] is seen, together with son and niece, at request of family for family conference. 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 Disease has progressed including intrahepatic metastases despite multiple treatment regimens.  Patient was seen also at symptom management clinic on 12-30-15 due to tachycardia, EKG with sinus tach. She began toprol XL 25 mg on 12-30-15. She is also more anemic, without overt bleeding, which is likely contributing to weakness and the sinus tachycardia.    Patient is fatigued with minimal exertion. She denies chest pain, cough. She had N/V x1 yesterday, but generally is able to eat and drinking some fluids. She denies pain. Bowels are moving. She is voiding. Knee high teds have improved swelling in lower legs, tho legs still feel heavy for her to move. She is able to sleep. She is enjoying time with son and niece, son coming after work on Wednesdays and staying thru Sunday pm (from [place]).   She declines flu vaccine now. PAC in Genetics testing sent 03-02-15, normal by Breast Ovarian Panel by [facility] CA 125 was 1406 in 10-2013  Copy of FMLA papers given to son now, as ? not received by his employer when faxed earlier this week  ONCOLOGIC HISTORY Patient had acute abdominal pain Dec 2014, which resolved without medical attention, then vague diffuse abdominal discomfort, low grade nausea and abdominal bloating. She was seen in June 2015 for first gyn exam in years, PAP with AGUS and follow up biopsies of endocervix and endometrium by Dr Pamala Hurry both with serous apparent endometrial cancer (pathology from [facility] 09-12-13, case # [id number] to be scanned into this EMR). Biopsy of cervix had normal squamous epithelium. CA125 from [facility] 09-16-13 was 964, and was up to 1406 by 10-23-13.Marland Kitchen She  was seen by Dr Denman George on 09-23-13, her exam remarkable for large pelvic mass minimally mobile and extending to umbilicus. She had CT CAP 09-26-13 had prominent but not enlarged juxtapericardiac lymph nodes, trace right pleural effusion, large amount of abnormal soft tissue thruout peritoneal cavity, predominately with omentum and especially LUQ, largest area 3.8 x 3.0 x 4.0 cm anterior to proximal descending colon, implants adjacent to liver, large complex multicystic lesion in pelvis inseperable from uterus and adnexae, with mass effect on bladder, questionable area in central aspect of dome of liver, borderline enlarged retroperitoneal nodes. Due to extent of disease, treatment began with chemotherapy. Cycle one taxol carboplatin was given on 0-97-35, complicated by severe nausea/vomiting, constipation and severe taxol aches. Cycle 2 was changed to dose dense carbo taxol, day 1 on 11-01-13. She needed gCSF support by cycle 3. Neoadjuvant chemo was one cycle carbo taxol standard dose and 3 cycles dose dense. 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 She resumed chemo with cycle 4 on 02-21-14 and completed cycle 6 on 04-25-14. She had unremarkable exam by Dr Denman George 08-11-14, however increase in CA 125 marker then, from 8 in 05-2014 to 26 in 08-2014, and repeat 63 on 09-08-14. CT AP 09-16-14 shows progressive peritoneal metastases, increasing retroperitoneal and transverse mesocolon nodes and soft tissue area at vaginal cuff stable to slightly decreased. She began doxil on 10-10-14, CA 125 151 that day as baseline for doxil. She had 4 cycles of doxil  thru 01-09-15, then CT 02-11-15 showed mixed response but with multiple new liver metastases. CA 125 on 02-02-15 was 299. She began weekly taxol with avastin 03-12-15. Restaging CT AP after 3 cycles 05-29-15 showed improvement in omental and vaginal cuff involvement,  single liver lesion slightly larger and right external iliac node increased, no other new disease, other liver areas stable to slightly smaller. Taxol avastin continued thru day 8 cycle 6 on 08-06-15, with repeat CT AP 09-02-15 showing increase in hepatic lesionsand scattered peritoneal involvement. She began letrozole on 09-03-15, DCd with progression 11-2015.  Objective:  Vital signs in last 24 hours:  BP (!) 145/93 (BP Location: Left Arm, Patient Position: Sitting)   Pulse (!) 133   Temp 98 F (36.7 C) (Oral)   Resp 19   Wt 224 lb 6.4 oz (101.8 kg)   SpO2 100%   BMI 38.52 kg/m  She has not taken metoprolol yet today. Weight up 3 lbs. Alert, oriented and in good spirits. In [place]. Somewhat dyspneic seated on RA.   HEENT:PERRL, sclerae not icteric. Oral mucosa moist without lesions Neck supple. No JVD upright. Lymphatics:no suraclavicular adenopathy Resp: BS somewhat diminished in bases consistent with habitus, otherwise clear to auscultation bilaterally and normal percussion bilaterally Cardio: tachy, regular rate and rhythm. No gallop. GI: soft, nontender, not distended. Some bowel sounds.  Musculoskeletal/ Extremities: ~ 1+ swelling thru knee high teds, without cords, tenderness. No pitting edema thighs.  Neuro: speech fluent, moves all extremities Skin without rash, ecchymosis, petechiae Portacath-without erythema or tenderness  Lab Results:  Results for orders placed or performed in visit on 12/30/15  TECHNOLOGIST REVIEW  Result Value Ref Range   Technologist Review  1  %nRBC   CBC with Differential  Result Value Ref Range   WBC 19.0 (H) 3.9 - 10.3 10e3/uL   NEUT# 17.0 (H) 1.5 - 6.5 10e3/uL   HGB 8.0 (L) 11.6 - 15.9 g/dL   HCT 25.4 (L) 34.8 - 46.6 %   Platelets 469 (H) 145 - 400 10e3/uL   MCV 76.3 (L) 79.5 - 101.0 fL   MCH 24.0 (L) 25.1 - 34.0 pg   MCHC 31.5 31.5 - 36.0 g/dL   RBC 3.33 (L) 3.70 - 5.45 10e6/uL   RDW 19.8 (H) 11.2 - 14.5 %   lymph# 1.0 0.9 - 3.3 10e3/uL    MONO# 1.0 (H) 0.1 - 0.9 10e3/uL   Eosinophils Absolute 0.0 0.0 - 0.5 10e3/uL   Basophils Absolute 0.0 0.0 - 0.1 10e3/uL   NEUT% 89.4 (H) 38.4 - 76.8 %   LYMPH% 5.2 (L) 14.0 - 49.7 %   MONO% 5.3 0.0 - 14.0 %   EOS% 0.0 0.0 - 7.0 %   BASO% 0.1 0.0 - 2.0 %   nRBC 1 (H) 0 - 0 %  Comprehensive metabolic panel  Result Value Ref Range   Sodium 138 136 - 145 mEq/L   Potassium 3.9 3.5 - 5.1 mEq/L   Chloride 103 98 - 109 mEq/L   CO2 19 (L) 22 - 29 mEq/L   Glucose 85 70 - 140 mg/dl   BUN 21.9 7.0 - 26.0 mg/dL   Creatinine 0.9 0.6 - 1.1 mg/dL   Total Bilirubin 0.35 0.20 - 1.20 mg/dL   Alkaline Phosphatase 271 (H) 40 - 150 U/L   AST 45 (H) 5 - 34 U/L   ALT 14 0 - 55 U/L   Total Protein 7.4 6.4 - 8.3 g/dL   Albumin 2.1 (L) 3.5 - 5.0 g/dL  Calcium 9.5 8.4 - 10.4 mg/dL   Anion Gap 16 (H) 3 - 11 mEq/L   EGFR 80 (L) >90 ml/min/1.73 m2     Studies/Results:  No results found.  Medications: I have reviewed the patient's current medications. Continue toprol XL 25 mg daily  DISCUSSION Discussed low dose, extended release beta blocker to try to improve tachycardia and BP a little, tho heart rate elevation in part is compensating for anemia, debility etc. If she feels more fatigued/ worse overall on the beta blocker, which is always a possibility as blood pressure medications are started, we will stop it. Goal is to let her be as comfortable as possible and to have "happy, cheerful time" with family.    Discussed PRBC transfusion, which she would like to try next week, as comfort intervention. She understands that this may give some temporary improvement in exertion tolerance and the tachycardia. We will look into infusion availability for PRBC and let hospice know by this note.  Family and patient given opportunity for other discussion and questions, no other concerns. If stable can cancel scheduled visit with this MD on 10-9, but have left that in place for now at family's request. Expect we will  soon be able to go to prn follow up at this office with Hospice assistance at home  Assessment/Plan: 1. 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 Has progressed on doxil, taxol avastin and letrozole. Progression in multiple large areas in liver and large LUQ peritoneal mass by CT 12-15-15 compared with 11-26-15. Comfort interventions. Appreciate all assistance from Hospice.  2.HTN: previously on lisinopril HCTZ then hypotensive and dehydrated on that medication. Sinus tachycardia likely multifactorial. Trying low dose beta blocker but will not push this. Tachycardia may improve some with PRBC transfusion. 3. PAC in 4. Degenerative arthritis low back and hips 5.Anemia progressive, no clear bleeding, last transfused 11-26-15. If PRBcs not clearly helpful next week, would not repeat CBC on any scheduled basis and would not try further transfusion in palliative attempt. Will ask Hospice to have [place] O2 available prn.  6.neuropathy symptoms from taxol feet>hands no worse 7.Patient does not want resuscitation or life support in irreversible situation, and understands that this malignancy is not curable. Code status DNR  8..morbid obesity 9. Long tobacco, cannot smoke if O2   Family understands that they can contact Hospice or this office at any time if needed. All are in agreement with plans above. Route PCP, cc hospice Time spent 20 min including >50% counseling and coordination of care   Evlyn Clines, MD   12/31/2015, 8:26 PM

## 2016-01-01 ENCOUNTER — Other Ambulatory Visit: Payer: Self-pay | Admitting: Oncology

## 2016-01-01 ENCOUNTER — Telehealth: Payer: Self-pay | Admitting: *Deleted

## 2016-01-01 NOTE — Telephone Encounter (Signed)
Told son Linus Orn that  the schedulers  And or Dr. Mariana Kaufman nurse will notify his cousin, Luberta Robertson (Niece) date and time of type and  cross blood with  transfusion for next week.  Luberta Robertson # [phone].

## 2016-01-01 NOTE — Telephone Encounter (Signed)
Sullivan and Son to continue metoprolol 25 mg daily and try to have Ms Lamunyon be as hydrated as possible.  [employer] of 64 oz a day.

## 2016-01-01 NOTE — Telephone Encounter (Signed)
"  Violeta Gelinas RN, [facility] [phone]) calling with an update on this patient who had office visits the past two days.  She started new medication.  Does she need any changes in the medication .  Metoprolol 25 mg was taken at 9:00 am today.  At 2:00 B/P sitting = 178/118, P = 130 pm.  Standing 1 minute later B/P = 196/140, P = 140.  Please return this call and call her son, Iniki Bough [phone]).  He is here for the weekend, would like a call also.

## 2016-01-01 NOTE — Telephone Encounter (Signed)
Message  Received: Today  Message Contents  Gordy Levan, MD  Baruch Merl, RN        Triage note below:  Violeta Gelinas RN, [facility] [phone]) calling with an update on this patient who had office visits the past two days. She started new medication. Does she need any changes in the medication . Metoprolol 25 mg was taken at 9:00 am today. At 2:00 B/P sitting = 178/118, P = 130 pm. Standing 1 minute later B/P = 196/140, P = 140. Please return this call and call her son, Marva Kromer [phone]). He is here for the weekend, would like a call also.   Violeta Gelinas RN, [facility] [phone] Cherylynn Ridges, RN   1. Please let hospice RN and son know to continue same extended release metoprolol 25 mg daily. Patient should try to drink fluids and stay hydrated as best possible.   2. Patient needs PRBCs 1 unit next week. Will need T&C same day. If you can please let me know when possible, I will but in the orders (not the HAR). Patient and family were in agreement with PRBCs and the T&C at our visit late afternoon 9-28   thanks

## 2016-01-03 ENCOUNTER — Telehealth: Payer: Self-pay | Admitting: Oncology

## 2016-01-03 NOTE — Telephone Encounter (Signed)
Called pt's niece as instructed. Gave appt for 10/4 @ 12.15.

## 2016-01-04 ENCOUNTER — Ambulatory Visit (HOSPITAL_COMMUNITY)
Admission: RE | Admit: 2016-01-04 | Discharge: 2016-01-04 | Disposition: A | Discharge status: Home / Self Care | Source: Ambulatory Visit | Attending: Oncology | Admitting: Oncology

## 2016-01-04 ENCOUNTER — Other Ambulatory Visit: Payer: Self-pay | Admitting: Oncology

## 2016-01-04 ENCOUNTER — Telehealth: Payer: Self-pay

## 2016-01-04 ENCOUNTER — Telehealth: Payer: Self-pay | Admitting: Oncology

## 2016-01-04 DIAGNOSIS — D630 Anemia in neoplastic disease: Secondary | ICD-10-CM | POA: Diagnosis not present

## 2016-01-04 DIAGNOSIS — C562 Malignant neoplasm of left ovary: Secondary | ICD-10-CM | POA: Insufficient documentation

## 2016-01-04 NOTE — Telephone Encounter (Signed)
lvm with [place] blood appt at [facility] tomorrow at 10 am.

## 2016-01-04 NOTE — Telephone Encounter (Signed)
Faxed last office note to [facility]

## 2016-01-05 ENCOUNTER — Telehealth: Payer: Self-pay

## 2016-01-05 ENCOUNTER — Ambulatory Visit (HOSPITAL_COMMUNITY)
Admission: RE | Admit: 2016-01-05 | Discharge: 2016-01-05 | Disposition: A | Discharge status: Home / Self Care | Source: Ambulatory Visit | Attending: Internal Medicine | Admitting: Internal Medicine

## 2016-01-05 DIAGNOSIS — C562 Malignant neoplasm of left ovary: Secondary | ICD-10-CM | POA: Diagnosis not present

## 2016-01-05 DIAGNOSIS — D630 Anemia in neoplastic disease: Secondary | ICD-10-CM

## 2016-01-05 LAB — PREPARE RBC (CROSSMATCH)

## 2016-01-05 MED ORDER — HEPARIN SOD (PORK) LOCK FLUSH 100 UNIT/ML IV SOLN
500.0000 [IU] | Freq: Every day | INTRAVENOUS | Status: AC | PRN
  Administered 2016-01-05: 500 [IU]
  Filled 2016-01-05: qty 5

## 2016-01-05 MED ORDER — SODIUM CHLORIDE 0.9% FLUSH: 3.0000 mL | INTRAVENOUS | Status: DC | PRN

## 2016-01-05 MED ORDER — SODIUM CHLORIDE 0.9 % IV SOLN
250.0000 mL | Freq: Once | INTRAVENOUS | Status: AC
  Administered 2016-01-05: 250 mL via INTRAVENOUS

## 2016-01-05 MED ORDER — HEPARIN SOD (PORK) LOCK FLUSH 100 UNIT/ML IV SOLN: 250.0000 [IU] | INTRAVENOUS | Status: DC | PRN

## 2016-01-05 MED ORDER — ACETAMINOPHEN 325 MG PO TABS
325.0000 mg | ORAL_TABLET | Freq: Once | ORAL | Status: AC
  Administered 2016-01-05: 325 mg via ORAL
  Filled 2016-01-05: qty 1

## 2016-01-05 MED ORDER — SODIUM CHLORIDE 0.9% FLUSH
10.0000 mL | INTRAVENOUS | Status: AC | PRN
  Administered 2016-01-05: 10 mL

## 2016-01-05 NOTE — Telephone Encounter (Signed)
S/w Mariel Kansky at hospice and she took verbal order for oxygen per Dr Mariana Kaufman attached message. And instructions of no smoking around O2

## 2016-01-05 NOTE — Telephone Encounter (Signed)
-----   Message from Gordy Levan, MD sent at 01/02/2016 10:11 AM EDT ----- On 10-2 or 10-3, please ask Hospice to have O2 2 liters [place] prn available at home. I understand that O2 sats are ok, but I think prn O2 may help her recover more quickly when SOB after minimal activity.  Obviously patient cannot smoke with the O2.  thanks

## 2016-01-05 NOTE — Discharge Instructions (Signed)
Blood Transfusion, Care After Refer to this sheet in the next few weeks. These instructions provide you with information about caring for yourself after your procedure. Your health care provider may also give you more specific instructions. Your treatment has been planned according to current medical practices, but problems sometimes occur. Call your health care provider if you have any problems or questions after your procedure. WHAT TO EXPECT AFTER THE PROCEDURE After your procedure, it is common to have:  Bruising and soreness at the IV site.  Chills or fever.  Headache. HOME CARE INSTRUCTIONS  Take medicines only as directed by your health care provider. Ask your health care provider if you can take an over-the-counter pain reliever in case you have a fever or headache a day or two after your transfusion.  Return to your normal activities as directed by your health care provider. SEEK MEDICAL CARE IF:   You develop redness or irritation at your IV site.  You have persistent fever, chills, or headache.  Your urine is darker than normal.  Your urine turns pink, red, or brown.   The white part of your eye turns yellow (jaundice).   You feel weak after doing your normal activities.  SEEK IMMEDIATE MEDICAL CARE IF:   You have trouble breathing.  You have fever and chills along with:  Anxiety.  Chest or back pain.  Flushed skin.  Clammy skin.  A rapid heartbeat.  Nausea.   This information is not intended to replace advice given to you by your health care provider. Make sure you discuss any questions you have with your health care provider.   Document Released: 04/11/2014 Document Reviewed: 04/11/2014 Elsevier Interactive Patient Education 2016 [place].  Blood Transfusion, Care After Refer to this sheet in the next few weeks. These instructions provide you with information about caring for yourself after your procedure. Your health care provider may also  give you more specific instructions. Your treatment has been planned according to current medical practices, but problems sometimes occur. Call your health care provider if you have any problems or questions after your procedure. WHAT TO EXPECT AFTER THE PROCEDURE After your procedure, it is common to have:  Bruising and soreness at the IV site.  Chills or fever.  Headache. HOME CARE INSTRUCTIONS  Take medicines only as directed by your health care provider. Ask your health care provider if you can take an over-the-counter pain reliever in case you have a fever or headache a day or two after your transfusion.  Return to your normal activities as directed by your health care provider. SEEK MEDICAL CARE IF:   You develop redness or irritation at your IV site.  You have persistent fever, chills, or headache.  Your urine is darker than normal.  Your urine turns pink, red, or brown.   The white part of your eye turns yellow (jaundice).   You feel weak after doing your normal activities.  SEEK IMMEDIATE MEDICAL CARE IF:   You have trouble breathing.  You have fever and chills along with:  Anxiety.  Chest or back pain.  Flushed skin.  Clammy skin.  A rapid heartbeat.  Nausea.   This information is not intended to replace advice given to you by your health care provider. Make sure you discuss any questions you have with your health care provider.   Document Released: 04/11/2014 Document Reviewed: 04/11/2014 Elsevier Interactive Patient Education 2016 [place].   Blood Transfusion  A blood transfusion is a procedure that  gives you donated blood through an IV tube. You may need blood because of illness, surgery, or injury. The blood may come from a donor. The blood may also be your own blood that you donated earlier. The blood you get is made up of different types of cells. You may get:   Red blood cells. These carry oxygen and replace lost blood.   Platelets.  These control bleeding.   Plasma. This helps blood to clot. If you have a clotting disorder, you may also get other types of blood products.  BEFORE THE PROCEDURE  You may have a blood test. This finds out what type of blood you have. It also finds out what kind of blood your body will accept.   If you are going to have a planned surgery, you may donate your own blood. This is done in case you need to have a transfusion.   If you have had an allergic transfusion reaction before, you may be given medicine to help prevent a reaction. Take this medicine only as told by your doctor.  You will have your temperature, blood pressure, and pulse checked. PROCEDURE   An IV will be started in your hand or arm.   The bag of donated blood will be attached to your IV and run into your vein.   A doctor will regularly check your temperature, blood pressure, and pulse during the procedure. This is done to find any early signs of a transfusion reaction.  If you have any signs or symptoms of a reaction, the procedure may be stopped and you may be given medicine.   When the transfusion is over, your IV will be removed.   Pressure may be applied to the IV site for a few minutes.   A bandage (dressing) will be applied.  The procedure may vary among doctors and hospitals.  AFTER THE PROCEDURE  Your blood pressure, temperature, and pulse will be checked regularly.   This information is not intended to replace advice given to you by your health care provider. Make sure you discuss any questions you have with your health care provider.   Document Released: 06/17/2008 Document Revised: 04/11/2014 Document Reviewed: 01/29/2014 Elsevier Interactive Patient Education [employer].

## 2016-01-05 NOTE — Progress Notes (Signed)
Patient ID: Melissa Ball, female   DOB: 01-20-1951, 65 y.o.   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  Patient tolerated procedure well. No reaction. Went over discharge instructions and copy given to patient. Relayed message from cancer center about disregarding the appointment tomorrow that is on her discharge instructions and patient understands. Assisted to wheelchair. Alert, oriented at time of discharge. Discharged with ride.

## 2016-01-06 ENCOUNTER — Other Ambulatory Visit: Payer: Medicare Other

## 2016-01-06 ENCOUNTER — Telehealth: Payer: Self-pay | Admitting: *Deleted

## 2016-01-06 ENCOUNTER — Ambulatory Visit: Payer: Medicare Other

## 2016-01-06 LAB — TYPE AND SCREEN
ABO/RH(D): O POS
Antibody Screen: NEGATIVE
Unit division: 0

## 2016-01-06 NOTE — Telephone Encounter (Signed)
FYI "This is Melissa Ball with Hospice calling to let you all know This patient's oxygen was ordered but she refused it upon delivery.  Sent all oxygen back."   This nurse asked about patient's smoking status.   "I had instructed the nurse to take no smoking signs to the home.  She has been continuing to smoke so this may be why she refused delivery of oxygen."

## 2016-01-07 ENCOUNTER — Telehealth: Payer: Self-pay

## 2016-01-07 NOTE — Telephone Encounter (Signed)
LM for son that Dr. Marko Plume said that the appointment for 01-11-16 can be cancelled as hospice in the home and can call for needs of his mother and up date Dr. Marko Plume with Ms Arrighi's condition. Pt can be seen by Dr. Marko Plume in the future if needed. Appointment 10-9=17 cancelled.

## 2016-01-11 ENCOUNTER — Ambulatory Visit: Payer: Medicare Other | Admitting: Oncology

## 2016-01-11 ENCOUNTER — Other Ambulatory Visit: Payer: Medicare Other

## 2016-01-12 ENCOUNTER — Telehealth: Payer: Self-pay

## 2016-01-12 NOTE — Telephone Encounter (Signed)
Pam calling stating that Melissa Ball is experiencing some terminal agitation. The 1 mg sl helps but does not last long. Spoke with Zoila Shutter and  Gave order for Ativan 1-2 mg sl q6 hrs prn. Melissa Ball is going to be transferred to [facility] in the morning per Pam.

## 2016-01-14 ENCOUNTER — Telehealth: Payer: Self-pay

## 2016-01-18 ENCOUNTER — Encounter: Payer: Self-pay | Admitting: Oncology

## 2016-01-18 NOTE — Progress Notes (Signed)
Medical Oncology  Per [facility], patient died January 22, 2016.  Sympathy letter written to son and family.  Will let Drs Denman George and Jenny Reichmann know, as patient very much appreciated their care.  Godfrey Pick, MD

## 2016-01-26 ENCOUNTER — Other Ambulatory Visit: Payer: Self-pay | Admitting: Nurse Practitioner

## 2016-01-26 DIAGNOSIS — C569 Malignant neoplasm of unspecified ovary: Secondary | ICD-10-CM

## 2016-02-03 NOTE — Telephone Encounter (Signed)
Melissa Ball with hospice called to let us know pt has been transferred to [facility] on Wed oct 11.

## 2016-02-03 DEATH — deceased

## 2016-03-19 ENCOUNTER — Other Ambulatory Visit: Payer: Self-pay | Admitting: Nurse Practitioner

## 2017-04-19 IMAGING — CR DG ABDOMEN ACUTE W/ 1V CHEST
4 series · 4 of 4 positions shown · non-contrast
Comparison: CT scan 11/26/2015.  X-rays from 11/23/2015.

CLINICAL DATA: Nausea vomiting and mid abdominal pain since
yesterday. Ovarian cancer.

EXAM:
DG ABDOMEN ACUTE W/ 1V CHEST

[w chest pa]
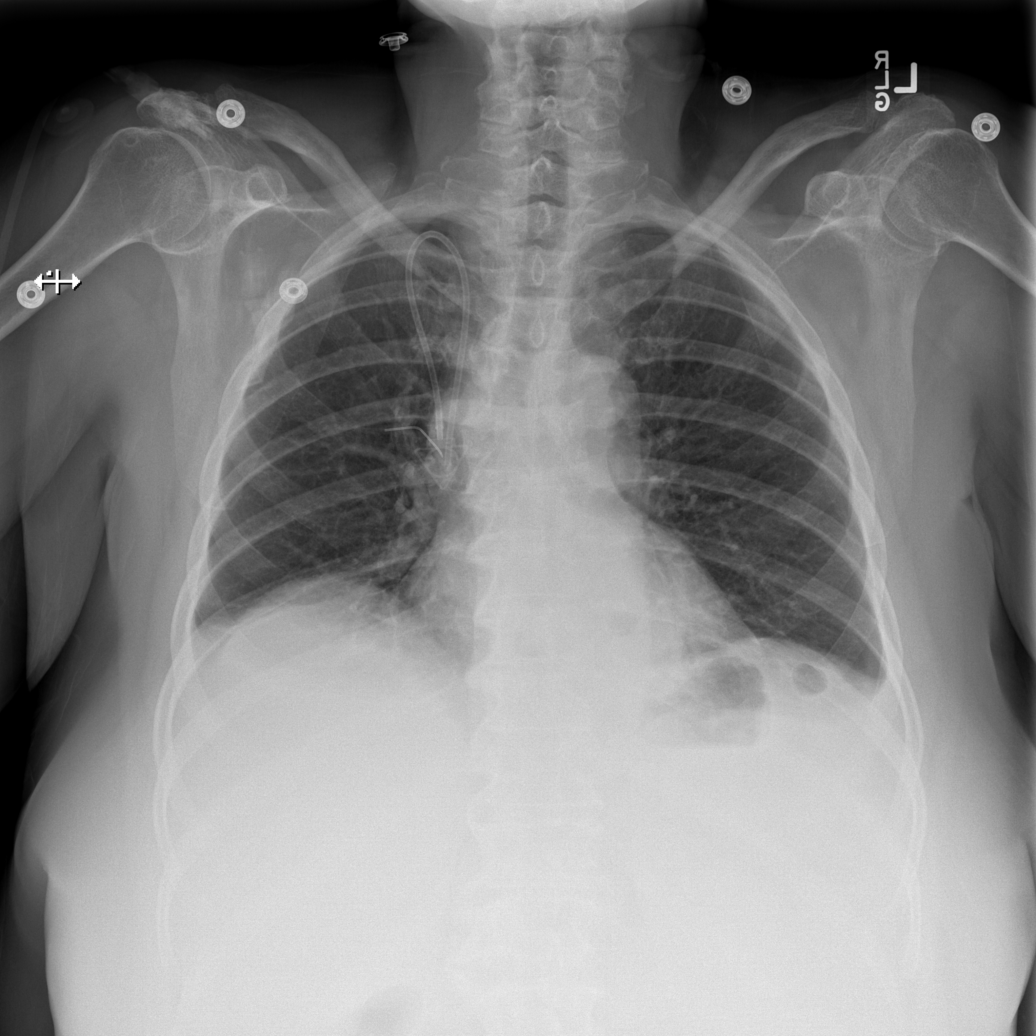

[w abdomen upright]
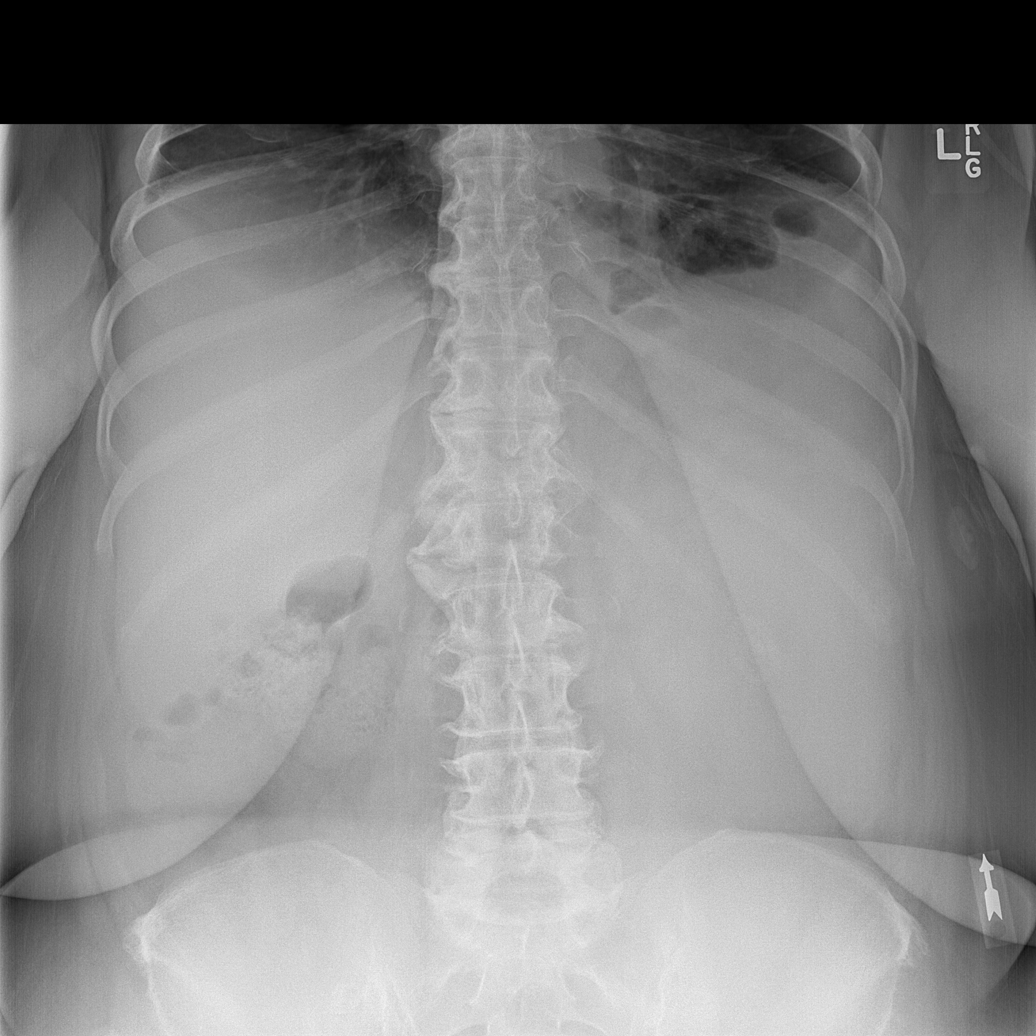

[t abdomen supine (1 of 2)]
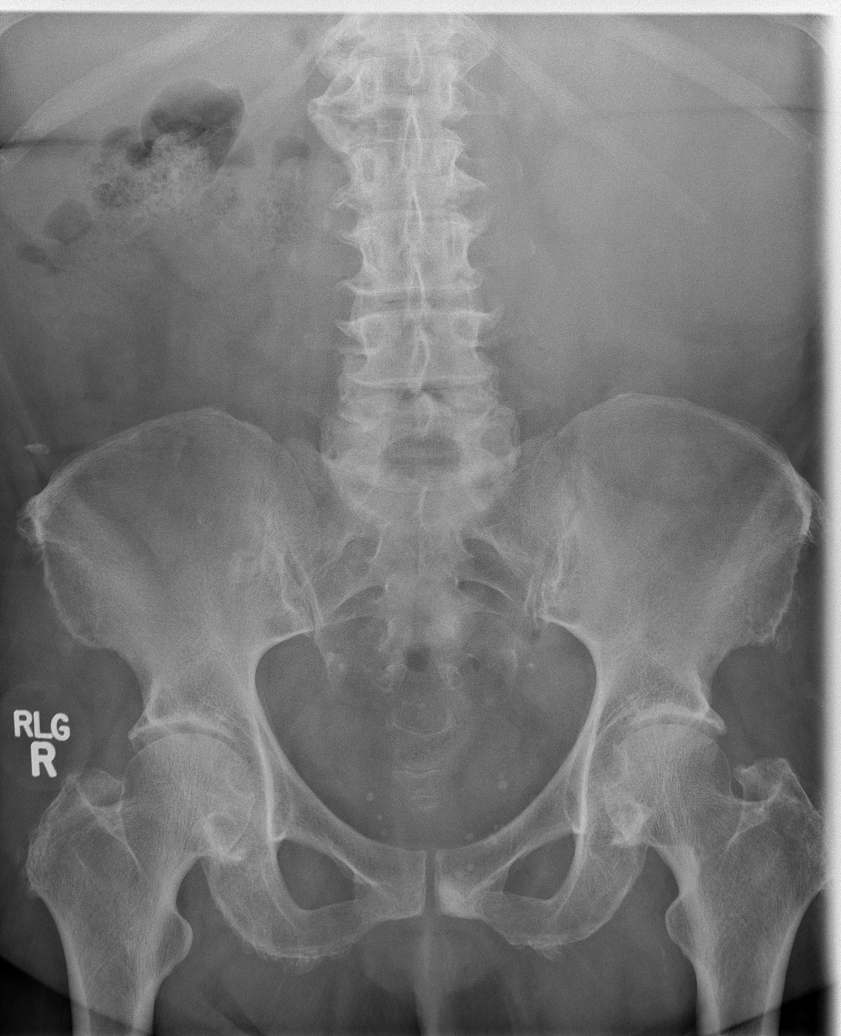

[t abdomen supine (2 of 2)]
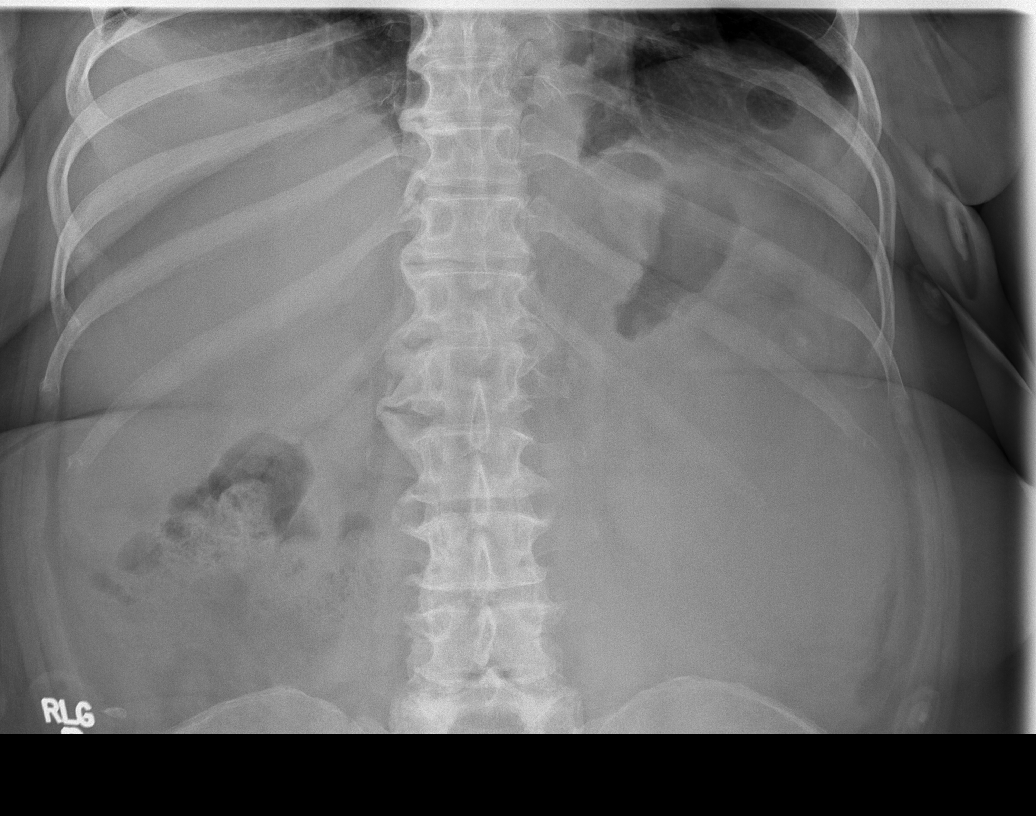

[4 of 4 positions shown; findings below may reference images not displayed]

FINDINGS: No edema or focal airspace consolidation. Probable tiny bilateral
pleural effusions noted. Cardiopericardial silhouette is at upper
limits of normal for size. Right Port-A-Cath remains in place with
tip overlying the mid SVC level.

Upright film shows no evidence for intraperitoneal free air. Supine
film shows no gaseous bowel dilatation suggest obstruction with
relatively gasless small bowel. Degenerative changes noted in lumbar
spine and bilateral hips, left greater than right.
IMPRESSION: 1. Tiny bilateral pleural effusions.
2. No evidence for intraperitoneal free air or bowel obstruction.

## 2023-02-22 NOTE — Telephone Encounter (Signed)
Telephone call
